# Patient Record
Sex: Female | Born: 1995 | Race: Black or African American | Hispanic: No | Marital: Single
Health system: Midwestern US, Community
[De-identification: ages and names within clinical notes are randomized; demographics above are authoritative.]

## PROBLEM LIST (undated history)

## (undated) ENCOUNTER — Emergency Department (HOSPITAL_COMMUNITY): Discharge status: Absent without leave | Payer: BLUE CROSS/BLUE SHIELD

## (undated) ENCOUNTER — Inpatient Hospital Stay (HOSPITAL_COMMUNITY): Payer: Self-pay

## (undated) ENCOUNTER — Inpatient Hospital Stay: Payer: Self-pay

## (undated) DIAGNOSIS — K219 Gastro-esophageal reflux disease without esophagitis: Secondary | ICD-10-CM

## (undated) DIAGNOSIS — R42 Dizziness and giddiness: Secondary | ICD-10-CM

## (undated) DIAGNOSIS — F32A Depression, unspecified: Secondary | ICD-10-CM

## (undated) DIAGNOSIS — A749 Chlamydial infection, unspecified: Secondary | ICD-10-CM

## (undated) DIAGNOSIS — R55 Syncope and collapse: Secondary | ICD-10-CM

## (undated) DIAGNOSIS — F3160 Bipolar disorder, current episode mixed, unspecified: Secondary | ICD-10-CM

## (undated) DIAGNOSIS — A549 Gonococcal infection, unspecified: Secondary | ICD-10-CM

## (undated) DIAGNOSIS — J45909 Unspecified asthma, uncomplicated: Secondary | ICD-10-CM

## (undated) DIAGNOSIS — F4310 Post-traumatic stress disorder, unspecified: Secondary | ICD-10-CM

## (undated) DIAGNOSIS — F419 Anxiety disorder, unspecified: Secondary | ICD-10-CM

## (undated) DIAGNOSIS — F319 Bipolar disorder, unspecified: Secondary | ICD-10-CM

## (undated) DIAGNOSIS — S2239XA Fracture of one rib, unspecified side, initial encounter for closed fracture: Secondary | ICD-10-CM

## (undated) DIAGNOSIS — F329 Major depressive disorder, single episode, unspecified: Secondary | ICD-10-CM

## (undated) DIAGNOSIS — F259 Schizoaffective disorder, unspecified: Secondary | ICD-10-CM

## (undated) DIAGNOSIS — D509 Iron deficiency anemia, unspecified: Secondary | ICD-10-CM

## (undated) HISTORY — DX: Gonococcal infection, unspecified: A54.9

## (undated) HISTORY — PX: NO PAST SURGERIES: SHX2092

## (undated) HISTORY — DX: Chlamydial infection, unspecified: A74.9

## (undated) HISTORY — DX: Fracture of one rib, unspecified side, initial encounter for closed fracture: S22.39XA

## (undated) HISTORY — DX: Dizziness and giddiness: R42

## (undated) HISTORY — DX: Syncope and collapse: R55

## (undated) HISTORY — DX: Schizoaffective disorder, unspecified: F25.9

## (undated) MED ORDER — PHENAZOPYRIDINE 200 MG TAB
200 mg | ORAL_TABLET | Freq: Three times a day (TID) | ORAL | Status: DC
Start: ? — End: 2012-08-31

## (undated) MED ORDER — ALBUTEROL SULFATE HFA 90 MCG/ACTUATION AEROSOL INHALER
90 mcg/actuation | Freq: Four times a day (QID) | RESPIRATORY_TRACT | Status: DC | PRN
Start: ? — End: 2019-03-30

## (undated) MED ORDER — CLOTRIMAZOLE 1 % TOPICAL CREAM
1 % | Freq: Two times a day (BID) | CUTANEOUS | Status: AC
Start: ? — End: 2012-10-05

## (undated) MED ORDER — HYDROCODONE-ACETAMINOPHEN 7.5 MG-325 MG TAB
ORAL_TABLET | Freq: Four times a day (QID) | ORAL | Status: DC | PRN
Start: ? — End: 2019-01-07

## (undated) MED ORDER — CEPHALEXIN 500 MG CAP
500 mg | ORAL_CAPSULE | Freq: Four times a day (QID) | ORAL | Status: AC
Start: ? — End: 2012-09-12

## (undated) MED ORDER — PHENAZOPYRIDINE 200 MG TAB
200 mg | ORAL_TABLET | Freq: Three times a day (TID) | ORAL | Status: AC
Start: ? — End: 2012-09-02

---
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
## 2013-08-31 NOTE — Patient Instructions (Signed)
Increase physical activity to 150 minutes a week (20 minutes a day). Reviewed the ABCs of fruits: Apples, Berries, and Cherries are a great fruit source, try to avoid anything that tastes very sweet as it is more of a low glycemic index food. Increase Green Leafy Vegetable intake, lean proteins such as baked fish and chicken, try to avoid fast food, greasy food, and fried food, and white foods such as White pasta, white potatoes, and white rice. Try substituting these foods with whole wheat/whole grain pasta, yams, and brown rice.

## 2013-08-31 NOTE — Progress Notes (Signed)
HISTORY OF PRESENT ILLNESS  Maddalena Artelia LarocheM Blume is a 18 y.o. female presents with New Patient and Medication Refill    [name] is here today to establish care.   She is present with her mother today.     Eyes: Not checked in the last year, will go next year (has every other year)  Dental: Not up to date  Diet: Does drink a lot of Pepsi  Exercise: Walks around campus      Denies any fevers, chills, unintentional weight loss, night sweats, chest pain, shortness of breath, chest pain/shortness of breath with exertion, abdominal pain, nausea, vomiting, diarrhea, constipation, blood in the stool, dysuria, new headaches, visual changes, or weakness.  The patient does admit that she drinks Pepsi all day, and that at times she has urinary frequency.       Agree with nurse note.      ROS    Review of Systems negative except as noted above in HPI.    ALLERGIES:  No Known Allergies    Current Outpatient Prescriptions   Medication Sig Dispense Refill   ??? biotin 2,500 mcg tab Take  by mouth daily.       ??? albuterol (PROAIR HFA) 90 mcg/actuation inhaler Take 2 puffs by inhalation every six (6) hours as needed for Wheezing. Indications: ACUTE ASTHMA ATTACK  1 Inhaler  6     PAST MEDICAL HISTORY:    Past Medical History   Diagnosis Date   ??? Asthma      Acute since childhood   ??? Contraception      Nexplanon left arm       PAST SURGICAL HISTORY:  History reviewed. No pertinent past surgical history.    FAMILY HISTORY:    Family History   Problem Relation Age of Onset   ??? Diabetes Mother    ??? Hypertension Mother    ??? Heart Disease Mother      SVT, ablation   ??? High Cholesterol Mother    ??? Other Mother      Fibromyalgia   ??? Cancer Maternal Grandmother [id number]     Breast and liver CA   ??? Heart Disease Maternal Grandmother    ??? Stroke Paternal Grandmother    ??? Heart Disease Paternal Grandfather    ??? Diabetes Other    ??? Hypertension Other        SOCIAL HISTORY:    History     Social History   ??? Marital Status: SINGLE     Spouse Name: N/A      Number of Children: N/A   ??? Years of Education: N/A     Occupational History   ??? 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 Smoking status: Current Every Day Smoker   ??? Smokeless tobacco: Current User      Comment: Black and milds   ??? Alcohol Use: No   ??? Drug Use: Yes     Special: Marijuana   ??? Sexually Active: Yes -- Female partner(s)     Birth Control/ Protection: Implant     Other Topics Concern   ??? Not on file     Social History Narrative   ??? No narrative on file       IMMUNIZATIONS:    There is no immunization history on file for this patient.      PHYSICAL EXAMINATION    Vital Signs  BP 112/57  Pulse 77   Temp(Src) 98.3 ??F (36.8 ??C) (Oral)   Resp 18   Ht 5' (1.524 m)   Wt 171 lb 9.6 oz (77.837 kg)   BMI 33.51 kg/m2   SpO2 96%    General appearance - Well nourished. Well appearing.  Well developed.  No acute distress. Overweight.   Head - Normocephalic.  Atraumatic.    Eyes - Extraocular eye movements intact.   Ears - Hearing is grossly normal bilaterally.      Nose - normal and patent. No discharge noted.    Mouth - mucous membranes with adequate moisture.  Posterior pharynx appears normal with no erythema, white exudate or obstruction.  Neck - supple.  Midline trachea. No thyromegaly noted.  Chest - clear to auscultation bilaterally anterriorly and posteriorly.  No wheezes.  No rales or rhonchi.  Breath sounds are symmetrical bilaterally.  Unlabored respirations.  Heart - normal rate.  Regular rhythm.  Normal S1, S2.  No murmur noted.  No rubs, clicks or gallops noted.  Abdomen - soft and nondistended.  No masses or organomegaly.  No rebound, rigidity or guarding.  Bowel sounds normal x 4 quadrants.  No tenderness noted.  Neurological - awake, alert. Cranial nerves II through XII grossly intact.  Clear speech.  Heme/Lymph - peripheral pulses normal x 4 extremities. No peripheral edema is noted.    Skin - no rashes, erythema, ecchymosis, noted, warm to touch   Psychological -   normal behavior, dress and thought processes.  Good insight. Good eye contact.  Normal affect.  Appropriate mood.  Normal speech.    ASSESSMENT and PLAN    Seerat was seen today for new patient and medication refill.    Diagnoses and associated orders for this visit:    Asthma  - albuterol (PROAIR HFA) 90 mcg/actuation inhaler; Take 2 puffs by inhalation every six (6) hours as needed for Wheezing. Indications: ACUTE ASTHMA ATTACK    Family history of early CAD  - METABOLIC PANEL, BASIC  - LIPID PANEL    Family history of diabetes mellitus type II  - HEMOGLOBIN A1C    Provided refill for albuterol inhaler today as patient's uses PRN.   Anticipatory guidance provided: Increase physical activity to 150 minutes a week (20 minutes a day). Reviewed the ABCs of fruits: Apples, Berries, and Cherries are a great fruit source, try to avoid anything that tastes very sweet as it is more of a low glycemic index food. Increase Green Leafy Vegetable intake, lean proteins such as baked fish and chicken, try to avoid fast food, greasy food, and fried food, and white foods such as White pasta, white potatoes, and white rice. Try substituting these foods with whole wheat/whole grain pasta, yams, and brown rice.     I encouraged the to continue to see a dentist every 6 months-1 year for screening and cleanings, and to see an eye doctor every other year for an eye check and glaucoma screening.   Will obtain fasting screening labs today due to the patient's family history as well as her current BMI>30.       Follow up PRN.   Patient expresses understanding of treatment plan and agrees with recommendations.          Patient Instructions   Increase physical activity to 150 minutes a week (20 minutes a day). Reviewed the ABCs of fruits: Apples, Berries, and Cherries are a great fruit source, try to avoid anything that tastes very sweet as it is  more of a low glycemic index food. Increase Green Leafy Vegetable intake, lean  proteins such as baked fish and chicken, try to avoid fast food, greasy food, and fried food, and white foods such as White pasta, white potatoes, and white rice. Try substituting these foods with whole wheat/whole grain pasta, yams, and brown rice.             Odie Sera, DO

## 2013-08-31 NOTE — Progress Notes (Signed)
New patient presents today to establish care with new pcp and medication refill. Patient present with Mother. No additional medical concerns noted at this time.

## 2014-08-27 DIAGNOSIS — F4310 Post-traumatic stress disorder, unspecified: Secondary | ICD-10-CM

## 2014-08-27 HISTORY — DX: Post-traumatic stress disorder, unspecified: F43.10

## 2014-10-02 ENCOUNTER — Emergency Department: Payer: Self-pay | Admitting: Emergency Medicine

## 2014-10-02 LAB — URINALYSIS, COMPLETE
Bacteria: NONE SEEN
Bilirubin,UR: NEGATIVE
Blood: NEGATIVE
Glucose,UR: NEGATIVE mg/dL (ref 0–75)
Hyaline Cast: 1
Ketone: NEGATIVE
Leukocyte Esterase: NEGATIVE
Nitrite: NEGATIVE
Ph: 5 (ref 4.5–8.0)
Protein: 30
RBC,UR: <1 /HPF (ref 0–5)
Specific Gravity: 1.026 (ref 1.003–1.030)
Squamous Epithelial: 7
WBC UR: 10 /HPF (ref 0–5)

## 2015-06-08 ENCOUNTER — Emergency Department (HOSPITAL_COMMUNITY)
Admission: EM | Admit: 2015-06-08 | Discharge: 2015-06-09 | Disposition: A | Discharge status: Home / Self Care | Payer: Medicaid Other | Attending: Emergency Medicine | Admitting: Emergency Medicine

## 2015-06-08 ENCOUNTER — Encounter (HOSPITAL_COMMUNITY): Payer: Self-pay | Admitting: Emergency Medicine

## 2015-06-08 DIAGNOSIS — R109 Unspecified abdominal pain: Secondary | ICD-10-CM | POA: Diagnosis not present

## 2015-06-08 DIAGNOSIS — Z3202 Encounter for pregnancy test, result negative: Secondary | ICD-10-CM | POA: Diagnosis not present

## 2015-06-08 DIAGNOSIS — R112 Nausea with vomiting, unspecified: Secondary | ICD-10-CM | POA: Diagnosis not present

## 2015-06-08 DIAGNOSIS — R110 Nausea: Secondary | ICD-10-CM

## 2015-06-08 LAB — I-STAT BETA HCG BLOOD, ED (MC, WL, AP ONLY): I-stat hCG, quantitative: <5 m[IU]/mL (ref <5)

## 2015-06-08 NOTE — ED Notes (Signed)
C/o nausea x 2 months.  States LMP 2 weeks ago but was light.  Pt requesting pregnancy test.

## 2015-06-08 NOTE — ED Provider Notes (Signed)
CSN: [id number]     Arrival date & time 06/08/15  2215 History  By signing my name below, I, Budd Palmer, attest that this documentation has been prepared under the direction and in the presence of [employer], PA-C. Electronically Signed: Budd Palmer, ED Scribe. 06/08/2015. 11:23 PM.    Chief Complaint  Patient presents with  . Nausea   The history is provided by the patient. No language interpreter was used.   HPI Comments: Melissa [name] is a 19 y.o. female who presents to the Emergency Department complaining of nausea onset 2 months ago. She reports associated suprapubic abdominal pain onset one week ago, as well as vomiting and generalized itchiness. She notes her LNMP was 1 week ago, which she states was lighter than usual. She notes she has tried benadryl for itching, but nothing for nausea. Pt denies vaginal discharge or bleeding, and urinary symptoms.  History reviewed. No pertinent past medical history. History reviewed. No pertinent past surgical history. No family history on file. Social History  Substance Use Topics  . Smoking status: Never Smoker   . Smokeless tobacco: None  . Alcohol Use: No   OB History    No data available     Review of Systems  Gastrointestinal: Positive for nausea, vomiting and abdominal pain.  Genitourinary: Negative for dysuria, frequency, vaginal bleeding, vaginal discharge and difficulty urinating.    Allergies  Review of patient's allergies indicates not on file.  Home Medications   Prior to Admission medications   Not on File   BP 127/72 mmHg  Pulse 87  Temp(Src) 98.5 F (36.9 C) (Oral)  Resp 18  SpO2 98%  LMP 05/25/2015 Physical Exam  Constitutional: She is oriented to person, place, and time. She appears well-developed and well-nourished.  HENT:  Head: Normocephalic.  Eyes: EOM are normal.  Neck: Normal range of motion.  Cardiovascular: Normal rate, regular rhythm and normal heart sounds.    Pulmonary/Chest: Effort normal and breath sounds normal. No respiratory distress. She has no wheezes. She has no rales.  Abdominal: Soft. Bowel sounds are normal. She exhibits no distension. There is no tenderness. There is no rebound and no guarding.  No CVA tenderness bilaterally  Musculoskeletal: Normal range of motion.  Neurological: She is alert and oriented to person, place, and time.  Psychiatric: She has a normal mood and affect.  Nursing note and vitals reviewed.   ED Course  Procedures  DIAGNOSTIC STUDIES: Oxygen Saturation is 98% on RA, normal by my interpretation.    COORDINATION OF CARE: 11:21 PM - Discussed plans to order diagnostic studies and perform a pelvic exam. Pt advised of plan for treatment and pt agrees.  Labs Review Labs Reviewed  WET PREP, GENITAL  URINALYSIS, ROUTINE W REFLEX MICROSCOPIC (NOT AT [name])  I-STAT BETA HCG BLOOD, ED ([facility], [facility], [facility] ONLY)  GC/CHLAMYDIA PROBE AMP ([place]) NOT AT [facility]    Imaging Review No results found. I have personally reviewed and evaluated these images and lab results as part of my medical decision-making.   EKG Interpretation None      MDM   Final diagnoses:  Nausea    patient emergency department complaining of bilateral lower abdominal, hip pain, nausea for several months. Patient is worried she may be pregnant. Will get urine pregnancy, urinalysis, pelvic exam.  Patient refused pelvic exam. Urinalysis is negative. Pregnancy test is negative. Discussed results with patient. Patient admitted that all she wanted to know is if she was pregnant. At this  time no active vomiting in the emergency department. Tolerating oral fluids. When to discharge home with outpatient follow-up as needed.  Filed Vitals:   06/08/15 2241 06/09/15 0114  BP: 127/72 120/80  Pulse: 87 82  Temp: 98.5 F (36.9 C) 98.3 F (36.8 C)  TempSrc: Oral   Resp: 18 19  SpO2: 98% 99%     Jaynie Crumbleatyana Deandrew Hoecker, PA-C 06/09/15 0115  Rolland PorterMark  James, MD 06/10/15 [phone]

## 2015-06-09 DIAGNOSIS — R112 Nausea with vomiting, unspecified: Secondary | ICD-10-CM | POA: Diagnosis not present

## 2015-06-09 LAB — URINALYSIS, ROUTINE W REFLEX MICROSCOPIC
Bilirubin Urine: NEGATIVE
Glucose, UA: NEGATIVE mg/dL
Hgb urine dipstick: NEGATIVE
Ketones, ur: NEGATIVE mg/dL
Leukocytes, UA: NEGATIVE
Nitrite: NEGATIVE
Protein, ur: NEGATIVE mg/dL
Specific Gravity, Urine: 1.03 (ref 1.005–1.030)
Urobilinogen, UA: 1 mg/dL (ref 0.0–1.0)
pH: 6.5 (ref 5.0–8.0)

## 2015-06-09 NOTE — ED Notes (Signed)
Pt refuses to have pelvic exam completed by PA.

## 2015-06-09 NOTE — Discharge Instructions (Signed)
Tried ginger products or Pepcid for nausea. Please follow-up with primary care doctor as needed. Urinalysis and urine pregnancy both negative   Nausea, Adult Nausea is the feeling that you have an upset stomach or have to vomit. Nausea by itself is not likely a serious concern, but it may be an early sign of more serious medical problems. As nausea gets worse, it can lead to vomiting. If vomiting develops, there is the risk of dehydration.  CAUSES   Viral infections.  Food poisoning.  Medicines.  Pregnancy.  Motion sickness.  Migraine headaches.  Emotional distress.  Severe pain from any source.  Alcohol intoxication. HOME CARE INSTRUCTIONS  Get plenty of rest.  Ask your caregiver about specific rehydration instructions.  Eat small amounts of food and sip liquids more often.  Take all medicines as told by your caregiver. SEEK MEDICAL CARE IF:  You have not improved after 2 days, or you get worse.  You have a headache. SEEK IMMEDIATE MEDICAL CARE IF:   You have a fever.  You faint.  You keep vomiting or have blood in your vomit.  You are extremely weak or dehydrated.  You have dark or bloody stools.  You have severe chest or abdominal pain. MAKE SURE YOU:  Understand these instructions.  Will watch your condition.  Will get help right away if you are not doing well or get worse.   This information is not intended to replace advice given to you by your health care provider. Make sure you discuss any questions you have with your health care provider.   Document Released: 09/20/2004 Document Revised: 09/03/2014 Document Reviewed: 04/25/2011 Elsevier Interactive Patient Education [employer]2016 Elsevier Inc.

## 2015-10-13 ENCOUNTER — Encounter (HOSPITAL_COMMUNITY): Payer: Self-pay

## 2015-10-13 ENCOUNTER — Emergency Department (HOSPITAL_COMMUNITY)
Admission: EM | Admit: 2015-10-13 | Discharge: 2015-10-13 | Disposition: A | Discharge status: Home / Self Care | Payer: Medicaid Other | Attending: Emergency Medicine | Admitting: Emergency Medicine

## 2015-10-13 DIAGNOSIS — F22 Delusional disorders: Secondary | ICD-10-CM | POA: Insufficient documentation

## 2015-10-13 HISTORY — DX: Depression, unspecified: F32.A

## 2015-10-13 HISTORY — DX: Major depressive disorder, single episode, unspecified: F32.9

## 2015-10-13 NOTE — ED Notes (Signed)
Pt's father came to get pt.  Left LWBS.

## 2015-10-13 NOTE — ED Notes (Signed)
Pt refused lab work. Triage RN aware.

## 2015-10-13 NOTE — ED Notes (Signed)
Per Da, pt is talking to others and delusional.  Pt denies.  Father states if she is cleared, she cannot come home.  He is wondering if drugs are involved.  Saying things that do not make sense.  Oriented in triage.

## 2015-10-13 NOTE — ED Notes (Signed)
Pt

## 2015-10-20 DIAGNOSIS — J45909 Unspecified asthma, uncomplicated: Secondary | ICD-10-CM | POA: Diagnosis not present

## 2015-10-20 DIAGNOSIS — F329 Major depressive disorder, single episode, unspecified: Secondary | ICD-10-CM | POA: Insufficient documentation

## 2015-10-20 DIAGNOSIS — Z008 Encounter for other general examination: Secondary | ICD-10-CM | POA: Diagnosis present

## 2015-10-21 ENCOUNTER — Encounter (HOSPITAL_COMMUNITY): Payer: Self-pay

## 2015-10-21 ENCOUNTER — Emergency Department (HOSPITAL_COMMUNITY)
Admission: EM | Admit: 2015-10-21 | Discharge: 2015-10-21 | Disposition: A | Discharge status: Home / Self Care | Payer: Medicaid Other | Attending: Emergency Medicine | Admitting: Emergency Medicine

## 2015-10-21 DIAGNOSIS — F32A Depression, unspecified: Secondary | ICD-10-CM

## 2015-10-21 DIAGNOSIS — F329 Major depressive disorder, single episode, unspecified: Secondary | ICD-10-CM

## 2015-10-21 HISTORY — DX: Unspecified asthma, uncomplicated: J45.909

## 2015-10-21 LAB — COMPREHENSIVE METABOLIC PANEL
ALT: 15 U/L (ref 14–54)
AST: 17 U/L (ref 15–41)
Albumin: 4.1 g/dL (ref 3.5–5.0)
Alkaline Phosphatase: 53 U/L (ref 38–126)
Anion gap: 6 (ref 5–15)
BUN: 12 mg/dL (ref 6–20)
CO2: 28 mmol/L (ref 22–32)
Calcium: 9.7 mg/dL (ref 8.9–10.3)
Chloride: 106 mmol/L (ref 101–111)
Creatinine, Ser: 0.92 mg/dL (ref 0.44–1.00)
GFR calc Af Amer: >60 mL/min (ref >60)
GFR calc non Af Amer: >60 mL/min (ref >60)
Glucose, Bld: 92 mg/dL (ref 65–99)
Potassium: 4.1 mmol/L (ref 3.5–5.1)
Sodium: 140 mmol/L (ref 135–145)
Total Bilirubin: 0.5 mg/dL (ref 0.3–1.2)
Total Protein: 7.1 g/dL (ref 6.5–8.1)

## 2015-10-21 LAB — CBC WITH DIFFERENTIAL/PLATELET
Basophils Absolute: 0 10*3/uL (ref 0.0–0.1)
Basophils Relative: 1 %
Eosinophils Absolute: 0.1 10*3/uL (ref 0.0–0.7)
Eosinophils Relative: 3 %
HCT: 36.8 % (ref 36.0–46.0)
Hemoglobin: 12.1 g/dL (ref 12.0–15.0)
Lymphocytes Relative: 47 %
Lymphs Abs: 2.7 10*3/uL (ref 0.7–4.0)
MCH: 29 pg (ref 26.0–34.0)
MCHC: 32.9 g/dL (ref 30.0–36.0)
MCV: 88.2 fL (ref 78.0–100.0)
Monocytes Absolute: 0.5 10*3/uL (ref 0.1–1.0)
Monocytes Relative: 8 %
Neutro Abs: 2.3 10*3/uL (ref 1.7–7.7)
Neutrophils Relative %: 41 %
Platelets: 234 10*3/uL (ref 150–400)
RBC: 4.17 MIL/uL (ref 3.87–5.11)
RDW: 13 % (ref 11.5–15.5)
WBC: 5.7 10*3/uL (ref 4.0–10.5)

## 2015-10-21 LAB — TSH: TSH: 2.139 u[IU]/mL (ref 0.350–4.500)

## 2015-10-21 NOTE — ED Notes (Signed)
Patient here with staff from [facility] for medical clearance.
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
## 2016-02-23 ENCOUNTER — Emergency Department
Admission: EM | Admit: 2016-02-23 | Discharge: 2016-02-23 | Disposition: A | Discharge status: Home / Self Care | Payer: BLUE CROSS/BLUE SHIELD | Attending: Emergency Medicine | Admitting: Emergency Medicine

## 2016-02-23 ENCOUNTER — Encounter: Payer: Self-pay | Admitting: Emergency Medicine

## 2016-02-23 DIAGNOSIS — R51 Headache: Secondary | ICD-10-CM | POA: Diagnosis not present

## 2016-02-23 DIAGNOSIS — F329 Major depressive disorder, single episode, unspecified: Secondary | ICD-10-CM | POA: Insufficient documentation

## 2016-02-23 DIAGNOSIS — Z711 Person with feared health complaint in whom no diagnosis is made: Secondary | ICD-10-CM | POA: Diagnosis not present

## 2016-02-23 DIAGNOSIS — Z79899 Other long term (current) drug therapy: Secondary | ICD-10-CM | POA: Diagnosis not present

## 2016-02-23 DIAGNOSIS — J45909 Unspecified asthma, uncomplicated: Secondary | ICD-10-CM | POA: Diagnosis not present

## 2016-02-23 DIAGNOSIS — Z87898 Personal history of other specified conditions: Secondary | ICD-10-CM

## 2016-02-23 LAB — CBC WITH DIFFERENTIAL/PLATELET
Basophils Absolute: 0.1 10*3/uL (ref 0–0.1)
Basophils Relative: 2 %
Eosinophils Absolute: 0.2 10*3/uL (ref 0–0.7)
Eosinophils Relative: 4 %
HCT: 33.6 % — ABNORMAL LOW (ref 35.0–47.0)
Hemoglobin: 11.7 g/dL — ABNORMAL LOW (ref 12.0–16.0)
Lymphocytes Relative: 35 %
Lymphs Abs: 1.7 10*3/uL (ref 1.0–3.6)
MCH: 29.9 pg (ref 26.0–34.0)
MCHC: 34.7 g/dL (ref 32.0–36.0)
MCV: 86.1 fL (ref 80.0–100.0)
Monocytes Absolute: 0.7 10*3/uL (ref 0.2–0.9)
Monocytes Relative: 14 %
Neutro Abs: 2.2 10*3/uL (ref 1.4–6.5)
Neutrophils Relative %: 45 %
Platelets: 196 10*3/uL (ref 150–440)
RBC: 3.9 MIL/uL (ref 3.80–5.20)
RDW: 13.1 % (ref 11.5–14.5)
WBC: 4.9 10*3/uL (ref 3.6–11.0)

## 2016-02-23 LAB — COMPREHENSIVE METABOLIC PANEL
ALT: 15 U/L (ref 14–54)
AST: 16 U/L (ref 15–41)
Albumin: 4.2 g/dL (ref 3.5–5.0)
Alkaline Phosphatase: 56 U/L (ref 38–126)
Anion gap: 5 (ref 5–15)
BUN: 7 mg/dL (ref 6–20)
CO2: 27 mmol/L (ref 22–32)
Calcium: 9.1 mg/dL (ref 8.9–10.3)
Chloride: 107 mmol/L (ref 101–111)
Creatinine, Ser: 0.85 mg/dL (ref 0.44–1.00)
GFR calc Af Amer: >60 mL/min (ref >60)
GFR calc non Af Amer: >60 mL/min (ref >60)
Glucose, Bld: 84 mg/dL (ref 65–99)
Potassium: 3.8 mmol/L (ref 3.5–5.1)
Sodium: 139 mmol/L (ref 135–145)
Total Bilirubin: 0.4 mg/dL (ref 0.3–1.2)
Total Protein: 6.8 g/dL (ref 6.5–8.1)

## 2016-02-23 LAB — URINALYSIS COMPLETE WITH MICROSCOPIC (ARMC ONLY)
Bilirubin Urine: NEGATIVE
Glucose, UA: NEGATIVE mg/dL
Hgb urine dipstick: NEGATIVE
Leukocytes, UA: NEGATIVE
Nitrite: NEGATIVE
Protein, ur: 30 mg/dL — AB
Specific Gravity, Urine: 1.034 — ABNORMAL HIGH (ref 1.005–1.030)
pH: 5 (ref 5.0–8.0)

## 2016-02-23 NOTE — ED Notes (Signed)
Pt discharged to home.  Family member driving.  Discharge instructions reviewed.  Verbalized understanding.  No questions or concerns at this time.  Teach back verified.  Pt in NAD.  No items left in ED.   

## 2016-02-23 NOTE — ED Notes (Signed)
POC pregnancy test negative.

## 2016-02-23 NOTE — ED Provider Notes (Signed)
[facility] Emergency Department Provider Note   ____________________________________________  Time seen: Approximately 3:30 PM  I have reviewed the triage vital signs and the nursing notes.   HISTORY  Chief Complaint Headache   HPI Melissa [name] is a 20 y.o. female is here today with complaint of history of headaches for which she is not taking any over-the-counter medication when she has a headache. She states that the headaches began posteriorly and comes around to the frontal area. These headaches do not cause any photophobia, nausea or vomiting. She states that she has not had a head injury. She has not sought medical attention for her headaches prior to today. She states that currently she is actively trying to get pregnant and did not know what to take should she want to take anything for her headache. Currently she does not have a doctor. She denies any fever, chills, vomiting or diarrhea. Currently she denies any pain with a 0/10.   Past Medical History  Diagnosis Date  . Depression   . Asthma     There are no active problems to display for this patient.   History reviewed. No pertinent past surgical history.  Current Outpatient Rx  Name  Route  Sig  Dispense  Refill  . Sertraline HCl (ZOLOFT PO)   Oral   Take 25 mg by mouth daily.           Allergies Review of patient's allergies indicates no known allergies.  No family history on file.  Social History Social History  Substance Use Topics  . Smoking status: Never Smoker   . Smokeless tobacco: None  . Alcohol Use: No    Review of Systems Constitutional: No fever/chills Eyes: No visual changes. ENT: No sore throat. Cardiovascular: Denies chest pain. Respiratory: Denies shortness of breath. Gastrointestinal: No abdominal pain.  No nausea, no vomiting.  No diarrhea.  No constipation. Genitourinary: Negative for dysuria. Musculoskeletal: Negative for back pain. Skin:  Negative for rash. Neurological: Positive for headaches, no focal weakness or numbness.  10-point ROS otherwise negative.  ____________________________________________   PHYSICAL EXAM:  VITAL SIGNS: ED Triage Vitals  Enc Vitals Group     BP 02/23/16 1512 111/79 mmHg     Pulse Rate 02/23/16 1512 87     Resp 02/23/16 1512 16     Temp 02/23/16 1512 98.2 F (36.8 C)     Temp Source 02/23/16 1512 Oral     SpO2 02/23/16 1512 10 %     Weight 02/23/16 1512 134 lb (60.782 kg)     Height 02/23/16 1512 5\' 7"  (1.702 m)     Head Cir --      Peak Flow --      Pain Score 02/23/16 1513 0     Pain Loc --      Pain Edu? --      Excl. in GC? --     Constitutional: Alert and oriented. Well appearing and in no acute distress.Patient is watching TV in the room and does not appear to be any acute distress.Patient denies any current headache. Eyes: Conjunctivae are normal. PERRL. EOMI. Head: Atraumatic. Nose: No congestion/rhinnorhea. Mouth/Throat: Mucous membranes are moist.  Oropharynx non-erythematous. Neck: No stridor.  No cervical tenderness on palpation posteriorly. Range of motion of cervical spine is without restriction or pain. Hematological/Lymphatic/Immunilogical: No cervical lymphadenopathy. Cardiovascular: Normal rate, regular rhythm. Grossly normal heart sounds.  Good peripheral circulation. Respiratory: Normal respiratory effort.  No retractions. Lungs CTAB. Musculoskeletal: Moves  upper and lower extremities without any difficulty. Neurologic:  Normal speech and language. No gross focal neurologic deficits are appreciated. No gait instability. Skin:  Skin is warm, dry and intact. No rash noted. Psychiatric: Mood and affect are normal. Speech and behavior are normal.  ____________________________________________   LABS (all labs ordered are listed, but only abnormal results are displayed)  Labs Reviewed  URINALYSIS COMPLETEWITH MICROSCOPIC ([facility] ONLY) - Abnormal; Notable for  the following:    Color, Urine YELLOW (*)    APPearance HAZY (*)    Ketones, ur TRACE (*)    Specific Gravity, Urine 1.034 (*)    Protein, ur 30 (*)    Bacteria, UA RARE (*)    Squamous Epithelial / LPF 0-5 (*)    All other components within normal limits  CBC WITH DIFFERENTIAL/PLATELET - Abnormal; Notable for the following:    Hemoglobin 11.7 (*)    HCT 33.6 (*)    All other components within normal limits  COMPREHENSIVE METABOLIC PANEL  POC URINE PREG, ED     PROCEDURES  Procedure(s) performed: None  Critical Care performed: No  ____________________________________________   INITIAL IMPRESSION / ASSESSMENT AND PLAN / ED COURSE  Pertinent labs & imaging results that were available during my care of the patient were reviewed by me and considered in my medical decision making (see chart for details).  ----------------------------------------- 4:09 PM on 02/23/2016 ----------------------------------------- Patient now complains of dizziness.   Lab work was within normal limits and patient was informed of her lab work including her pregnancy test was negative. Patient continues to state that she does not have a headache currently. We discussed making an appointment with a neurologist to talk about her intermittent headaches and what medications she can take. She is also given information that she could take Tylenol sparingly if needed for headache while trying to get pregnant. Patient is to make a point with the neurologist or establish a primary care doctor for any continued problems. Patient was noted to be eating a cookie and continuing to watch TV prior to her discharge. ____________________________________________   FINAL CLINICAL IMPRESSION(S) / ED DIAGNOSES  Final diagnoses:  Feared complaint without diagnosis  Hx of headache      NEW MEDICATIONS STARTED DURING THIS VISIT:  Discharge Medication List as of 02/23/2016  5:41 PM       Note:  This document was  prepared using Dragon voice recognition software and may include unintentional dictation errors.    Tommi RumpsRhonda L Summers, PA-C 02/23/16 1816  Emily FilbertJonathan E Vine, MD 02/24/16 [phone]

## 2016-02-23 NOTE — Discharge Instructions (Signed)
You may take Tylenol sparingly when you have a headache without any danger for your future pregnancy. Increase fluids. Call and make an appointment with the doctor listed on your papers for further  evaluation of your intermittent headaches.

## 2016-02-23 NOTE — ED Notes (Signed)
Pt reports that she does not take anything over the counter when she has headaches.  Pt states that she is here to get something not as strong for headaches.  Pt reports that she does not currently have a headache.  Pt states when she has them that she feels like she has pins and needles in her head.

## 2016-02-23 NOTE — ED Notes (Signed)
C/O fever x 2 - 3 weeks.  Also C/O headaches for "a couple of weeks".  Denies current headache.  Denies any current complaint.

## 2016-05-03 ENCOUNTER — Emergency Department (HOSPITAL_COMMUNITY)
Admission: EM | Admit: 2016-05-03 | Discharge: 2016-05-04 | Disposition: A | Discharge status: Other Acute Inpatient Hospital | Payer: Medicaid Other | Attending: Emergency Medicine | Admitting: Emergency Medicine

## 2016-05-03 ENCOUNTER — Encounter (HOSPITAL_COMMUNITY): Payer: Self-pay | Admitting: Emergency Medicine

## 2016-05-03 DIAGNOSIS — Z046 Encounter for general psychiatric examination, requested by authority: Secondary | ICD-10-CM

## 2016-05-03 DIAGNOSIS — R258 Other abnormal involuntary movements: Secondary | ICD-10-CM | POA: Diagnosis not present

## 2016-05-03 DIAGNOSIS — Z5181 Encounter for therapeutic drug level monitoring: Secondary | ICD-10-CM | POA: Diagnosis not present

## 2016-05-03 DIAGNOSIS — F320 Major depressive disorder, single episode, mild: Secondary | ICD-10-CM | POA: Insufficient documentation

## 2016-05-03 DIAGNOSIS — J45909 Unspecified asthma, uncomplicated: Secondary | ICD-10-CM | POA: Insufficient documentation

## 2016-05-03 LAB — COMPREHENSIVE METABOLIC PANEL
ALT: 15 U/L (ref 14–54)
AST: 21 U/L (ref 15–41)
Albumin: 4.5 g/dL (ref 3.5–5.0)
Alkaline Phosphatase: 51 U/L (ref 38–126)
Anion gap: 7 (ref 5–15)
BUN: 7 mg/dL (ref 6–20)
CO2: 26 mmol/L (ref 22–32)
Calcium: 9.6 mg/dL (ref 8.9–10.3)
Chloride: 107 mmol/L (ref 101–111)
Creatinine, Ser: 0.76 mg/dL (ref 0.44–1.00)
GFR calc Af Amer: >60 mL/min (ref >60)
GFR calc non Af Amer: >60 mL/min (ref >60)
Glucose, Bld: 117 mg/dL — ABNORMAL HIGH (ref 65–99)
Potassium: 3.7 mmol/L (ref 3.5–5.1)
Sodium: 140 mmol/L (ref 135–145)
Total Bilirubin: 1 mg/dL (ref 0.3–1.2)
Total Protein: 7.7 g/dL (ref 6.5–8.1)

## 2016-05-03 LAB — CBC
HCT: 37.6 % (ref 36.0–46.0)
Hemoglobin: 12.6 g/dL (ref 12.0–15.0)
MCH: 28.9 pg (ref 26.0–34.0)
MCHC: 33.5 g/dL (ref 30.0–36.0)
MCV: 86.2 fL (ref 78.0–100.0)
Platelets: 278 10*3/uL (ref 150–400)
RBC: 4.36 MIL/uL (ref 3.87–5.11)
RDW: 13.7 % (ref 11.5–15.5)
WBC: 6.1 10*3/uL (ref 4.0–10.5)

## 2016-05-03 LAB — ACETAMINOPHEN LEVEL: Acetaminophen (Tylenol), Serum: <10 ug/mL — ABNORMAL LOW (ref 10–30)

## 2016-05-03 LAB — SALICYLATE LEVEL: Salicylate Lvl: <4 mg/dL (ref 2.8–30.0)

## 2016-05-03 LAB — ETHANOL: Alcohol, Ethyl (B): <5 mg/dL (ref <5)

## 2016-05-03 MED ORDER — IBUPROFEN 200 MG PO TABS: 600.0000 mg | ORAL_TABLET | Freq: Three times a day (TID) | ORAL | Status: DC | PRN

## 2016-05-03 NOTE — ED Notes (Signed)
[facility]  She was escorted to her room and oriented to the unit.  She is pleasant and cooperative on approach.  She requested the television to be changed to [place] [employer].  She is currently resting quietly in her room.
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
## 2016-05-03 NOTE — ED Notes (Signed)
Bed: [facility] Expected date:  Expected time:  Means of arrival:  Comments: Triage 4 

## 2016-05-04 ENCOUNTER — Inpatient Hospital Stay (HOSPITAL_COMMUNITY)
Admission: AD | Admit: 2016-05-04 | Discharge: 2016-05-09 | DRG: 885 | Disposition: A | Discharge status: Home / Self Care | Payer: Self-pay | Source: Intra-hospital | Attending: Psychiatry | Admitting: Psychiatry

## 2016-05-04 DIAGNOSIS — F320 Major depressive disorder, single episode, mild: Secondary | ICD-10-CM | POA: Diagnosis present

## 2016-05-04 DIAGNOSIS — R45850 Homicidal ideations: Secondary | ICD-10-CM

## 2016-05-04 DIAGNOSIS — F250 Schizoaffective disorder, bipolar type: Secondary | ICD-10-CM

## 2016-05-04 DIAGNOSIS — Z9114 Patient's other noncompliance with medication regimen: Secondary | ICD-10-CM

## 2016-05-04 LAB — RAPID URINE DRUG SCREEN, HOSP PERFORMED
Amphetamines: NOT DETECTED
Barbiturates: NOT DETECTED
Benzodiazepines: NOT DETECTED
Cocaine: NOT DETECTED
Opiates: NOT DETECTED
Tetrahydrocannabinol: NOT DETECTED

## 2016-05-04 LAB — PREGNANCY, URINE: Preg Test, Ur: NEGATIVE

## 2016-05-04 MED ORDER — TRAZODONE HCL 50 MG PO TABS: 50.0000 mg | ORAL_TABLET | Freq: Every evening | ORAL | Status: DC | PRN

## 2016-05-04 NOTE — ED Notes (Signed)
Patient noted in room. No complaints, stable, in no acute distress. Q15 minute rounds and monitoring via Security Cameras to continue.  

## 2016-05-04 NOTE — ED Notes (Signed)
Patient pleasant on approach tonight. Resting in bed. Explained to her that she would probably be transferred tonight to behavioral health. Patient states she understands. No aggressive behaviors while here in ED. Having some HI toward family. No SI at present.
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
## 2016-05-04 NOTE — Progress Notes (Signed)
[facility]  Admitting is Dr. Jannifer FranklinAkintayo, Patient has been [facility] to 504-1, [facility]  [facility] Lillia AbedLindsay.  Patient can be transported after 4:30 p.m. Today Bethany Cumming K. Sherlon HandingHarris, LCAS-A, LPC-A, [facility]  Counselor 05/04/2016 11:48 AM

## 2016-05-04 NOTE — Progress Notes (Signed)
05/04/16 1350:  LRT went to pt room.  Pt was lying down but awake.  LRT asked pt if she was interested in any activities such as cards, UNO, coloring or word searches.  Pt stated she wanted to color.  Pt didn't have any specific pictures she wanted.  LRT printed out coloring pages of various things for pt.   Caroll RancherMarjette Kavaughn Faucett, LRT/CTRS
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
## 2016-05-05 ENCOUNTER — Encounter (HOSPITAL_COMMUNITY): Payer: Self-pay | Admitting: *Deleted

## 2016-05-05 DIAGNOSIS — F250 Schizoaffective disorder, bipolar type: Principal | ICD-10-CM

## 2016-05-05 DIAGNOSIS — F4312 Post-traumatic stress disorder, chronic: Secondary | ICD-10-CM

## 2016-05-05 DIAGNOSIS — F320 Major depressive disorder, single episode, mild: Secondary | ICD-10-CM

## 2016-05-05 MED ORDER — TRAZODONE HCL 50 MG PO TABS
50.0000 mg | ORAL_TABLET | Freq: Every evening | ORAL | Status: DC | PRN
  Filled 2016-05-05 (×12): qty 1

## 2016-05-05 MED ORDER — ACETAMINOPHEN 325 MG PO TABS: 650.0000 mg | ORAL_TABLET | Freq: Four times a day (QID) | ORAL | Status: DC | PRN

## 2016-05-05 MED ORDER — MAGNESIUM HYDROXIDE 400 MG/5ML PO SUSP: 30.0000 mL | Freq: Every day | ORAL | Status: DC | PRN

## 2016-05-05 MED ORDER — HYDROXYZINE HCL 25 MG PO TABS
25.0000 mg | ORAL_TABLET | Freq: Four times a day (QID) | ORAL | Status: DC | PRN
  Filled 2016-05-05: qty 1

## 2016-05-05 MED ORDER — ALUM & MAG HYDROXIDE-SIMETH 200-200-20 MG/5ML PO SUSP: 30.0000 mL | ORAL | Status: DC | PRN
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
## 2016-05-05 NOTE — [facility] Group Notes (Signed)
[facility] Group Notes: (Clinical Social Work)   05/05/2016      Type of Therapy:  Group Therapy   Participation Level:  Did Not Attend despite MHT prompting   Afshin Chrystal Grossman-Orr, LCSW 05/05/2016, 12:29 PM     

## 2016-05-05 NOTE — Progress Notes (Signed)
Patient has been isolative to her room this shift.  Patient spent the majority of the time in her room resting except for a few minutes where she spent in the group room.  Patient did not engage this Clinical research associatewriter in conversation this shift. Patient had no incident of behavioral dyscontrol.   Assess patient for safety, offer medications as prescribed, engage patient in 1:1 staff talk.   Continue to monitor as prescribed, patient able to contract for safety.
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
## 2016-05-05 NOTE — Progress Notes (Signed)
Adult Psychoeducational Group Note  Date:  05/05/2016 Time:  9:22 PM  Group Topic/Focus:  Wrap-Up Group:   The focus of this group is to help patients review their daily goal of treatment and discuss progress on daily workbooks.   Participation Level:  Active  Participation Quality:  Appropriate  Affect:  Appropriate  Cognitive:  Appropriate  Insight: Appropriate  Engagement in Group:  Engaged  Modes of Intervention:  Discussion  Additional Comments: The patient expressed that she attended all groups.The patient also said that her day was fine. Octavio Mannshigpen, Ranon Coven Lee 05/05/2016, 9:22 PM
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
## 2016-05-06 DIAGNOSIS — Z9119 Patient's noncompliance with other medical treatment and regimen: Secondary | ICD-10-CM

## 2016-05-06 NOTE — Progress Notes (Signed)
D: Patient pleasant and brightens on approach. Pt states she does not feel as if she needs to take Trazodone for sleep, as she is sleeping without difficulty. Pt interacting well with peers in the milieu and she participated in the evening wrap up group session.  A: Q 15 minute safety checks, encourage staff/peer interaction and group participation; administer medications as ordered.  R: Pt denies SI/HI or plans to harm herself; no s/s of distress noted this shift.

## 2016-05-06 NOTE — [facility] Group Notes (Signed)
[facility] Group Notes: (Clinical Social Work)   05/06/2016      Type of Therapy:  Group Therapy   Participation Level:  Did Not Attend despite MHT prompting   Ambrose MantleMareida Grossman-Orr, LCSW 05/06/2016, 12:16 PM
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
## 2016-05-06 NOTE — Progress Notes (Signed)
[facility] Group Notes:  (Nursing/MHT/Case Management/Adjunct)  Date:  05/06/2016  Time:  9:35 PM  Type of Therapy:  Psychoeducational Skills  Participation Level:  Minimal  Participation Quality:  Resistant  Affect:  Blunted  Cognitive:  Appropriate  Insight:  Limited  Engagement in Group:  Limited  Modes of Intervention:  Education  Summary of Progress/Problems: Patient states that she had a good day and that she enjoyed going outdoors for recreation. In terms of the theme for the day, her support system will be comprised of herself.   Melissa [name], Melissa [name] 05/06/2016, 9:35 PM
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
## 2016-05-07 MED ORDER — ZIPRASIDONE MESYLATE 20 MG IM SOLR: 20.0000 mg | INTRAMUSCULAR | Status: DC | PRN

## 2016-05-07 MED ORDER — LORAZEPAM 1 MG PO TABS: 1.0000 mg | ORAL_TABLET | ORAL | Status: DC | PRN

## 2016-05-07 MED ORDER — RISPERIDONE 2 MG PO TBDP
2.0000 mg | ORAL_TABLET | Freq: Three times a day (TID) | ORAL | Status: DC | PRN
Start: 2016-05-07 — End: 2016-05-09

## 2016-05-07 NOTE — Plan of Care (Signed)
Problem: Safety: Goal: Ability to demonstrate self-control will improve Outcome: Progressing Client demonstrates self control AEB interacting appropriately with staff and peers.
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
## 2016-05-07 NOTE — Progress Notes (Signed)
Nursing Note 05/07/2016 [id number]  Data Reports sleeping good without PRN sleep med.  Rates depression 0/10, hopelessness 0/10, and anxiety 0/10. Affect appropriate mood euthymic.  Denies HI, SI, AVH.  Stayed in room during AM, Did not attend AM group, but stated she would make it to the afternoon groups.  Patient signed in voluntarily this afternoon.  Action Spoke with patient 1:1, nurse offered support to patient throughout shift.  Continues to be monitored on 15 minute checks for safety.    Response Attended afternoon group.  No acting out behaviors this shift. Remains safe and appropriate on unit.

## 2016-05-07 NOTE — Progress Notes (Signed)
Psychoeducational Group Note  Date:  05/07/2016 Time:  2137  Group Topic/Focus:  Wrap-Up Group:   The focus of this group is to help patients review their daily goal of treatment and discuss progress on daily workbooks.   Participation Level: Did Not Attend  Participation Quality:  Not Applicable  Affect:  Not Applicable  Cognitive:  Not Applicable  Insight:  Not Applicable  Engagement in Group: Not Applicable  Additional Comments:  The patient did not attend group this evening.    Hazle CocaGOODMAN, Felesha Moncrieffe S 05/07/2016, 9:37 PM

## 2016-05-07 NOTE — Progress Notes (Signed)
Recreation Therapy Notes  Date: 05/07/16 Time: 1000 Location: 500 Hall Dayroom  Group Topic: Communication  Goal Area(s) Addresses:  Patient will effectively communicate with peers in group.  Patient will verbalize benefit of healthy communication. Patient will verbalize positive effect of healthy communication on post d/c goals.  Patient will identify communication techniques that made activity effective for group.   Behavioral Response: Engaged  Intervention:  Dry erase board, eraser, dry erase marker, strips of paper with random words   Activity: Pictionary.  LRT are divided the group into two teams.  Each person would get a turn.  Each person would draw a strip of paer from the container.  The person has to draw whatever is on the paper,  on the board.  While they are drawing, their team is trying to guess what it is they are drawing.  If the team guesses the picture they get a point.     Education:Communication, Discharge Planning  Education Outcome: Acknowledges understanding/In group clarification offered/Needs additional education.   Clinical Observations/Feedback: Pt was quiet but did engage in the activity.  Pt was active in trying to guess the pictures.     Caroll RancherMarjette Maliah Pyles, LRT/CTRS      Caroll RancherLindsay, Jaklyn Alen A 05/07/2016 11:56 AM

## 2016-05-07 NOTE — Progress Notes (Signed)
Recreation Therapy Notes  INPATIENT RECREATION THERAPY ASSESSMENT  Patient Details Name: Melissa [name] MRN: [id number] DOB: 1996-04-13 Today's Date: 05/07/2016  Patient Stressors: Family, Other (Comment) (Financial (housing))  Pt stated she was here for "anger management issues".  Coping Skills:   Isolate, Exercise, Art/Dance, Music, Other (Comment) (Counting to 10)  Personal Challenges: Anger, Communication, Stress Management  Leisure Interests (2+):  Art - Draw, Individual - Other (Comment) Adriana Simas([place])  Awareness of Community Resources:  Yes  Community Resources:  Park  Current Use: Yes  Patient Strengths:  Communication, confidence  Patient Identified Areas of Improvement:  "I don't know", "I'm open to ideas"  Current Recreation Participation:  Everyday  Patient Goal for Hospitalization:  "Get outside life together"  [place]idence[place]  [place] Residence[place]   Current [place] (including self-harm):  No  Current HI:  No  Consent to Intern Participation: N/A   Caroll RancherMarjette Allyn Bartelson, LRT/CTRS  Caroll RancherLindsay, Esperansa Sarabia A 05/07/2016, 3:15 PM
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
## 2016-05-08 NOTE — Progress Notes (Signed)
D: Pt denies SI/HI/AVH. Pt is pleasant and cooperative. Pt avoidant, pt denies anything is wrong. Pt has minimal insight into Tx, pt appears childlike on approach.   A: Pt was offered support and encouragement. Pt was given scheduled medications. Pt was encourage to attend groups. Q 15 minute checks were done for safety.   R: Pt is taking medication. Pt has no complaints.Pt receptive to treatment and safety maintained on unit.

## 2016-05-08 NOTE — Progress Notes (Signed)
Did not attend group 
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
## 2016-05-08 NOTE — Progress Notes (Signed)
D: Pt denies SI/HI/AV. Pt is pleasant and cooperative. Pt was isolative to room all of shift. She did not attend group. A: Pt was offered support and encouragement.  Pt was encourage to attend groups. Q 15 minute checks were done for safety.  R: Pt has no complaints.Pt receptive to treatment and safety maintained on unit.

## 2016-05-08 NOTE — Progress Notes (Signed)
Recreation Therapy Notes  Date: 05/08/16 Time: 1000 Location:  [address] Group Room  Group Topic: Self-Esteem  Goal Area(s) Addresses:  Patient will identify the importance of recognizing your uniqueness. Patient will verbalize benefit of increased self-esteem.  Intervention: Construction paper, magazines, scissors, glue sticks, colored pencils  Activity: Advertising.  LRT went over the concept of advertising and the purpose of it.  Patients were to look through the magazines to find pictures or words that describe and highlight the uniqueness about them.  Patients could also use colored pencils to write words or draw pictures if they couldn't find any in the magazines.   Education:  Self-Esteem, Discharge Planning.   Education Outcome: Needs additional education  Clinical Observations/Feedback: Pt did not attend group.   Kyisha Fowle, LRT/CTRS         Aylene Acoff A 05/08/2016 12:08 PM 

## 2016-05-08 NOTE — [facility] Group Notes (Signed)
[facility] LCSW Group Therapy  05/08/2016 , 1:31 PM   Type of Therapy:  Group Therapy  Participation Level:  Active  Participation Quality:  Attentive  Affect:  Appropriate  Cognitive:  Alert  Insight:  Improving  Engagement in Therapy:  Engaged  Modes of Intervention:  Discussion, Exploration and Socialization  Summary of Progress/Problems: Today's group focused on the term Diagnosis.  Participants were asked to define the term, and then pronounce whether it is a negative, positive or neutral term. Invited.  Chose to not attend.  Daryel Geraldorth, Avanna Sowder B 05/08/2016 , 1:31 PM

## 2016-05-08 NOTE — Progress Notes (Signed)
D: Pt A & O X3. Presents with depressed affect and mood. Denies SI, HI, AVH and pain when assessed with a head node then replied "I'm alright".  Pt is with drawned, isolative to her room. Avertive eye contact noted with soft speech.  Ate breakfast in her room and refused lunch when offered.  A: Emotional support and availability provided to pt. Encouraged to attend groups, increase PO intake and to voice concerns. Q 15 minutes safety checks maintained without incident to note thus far.  R: Pt stayed in bed all day thus far, did not attend scheduled unit groups. Pt remains safe on unit. POC maintained.

## 2016-05-09 MED ORDER — TRAZODONE HCL 50 MG PO TABS: 50.0000 mg | ORAL_TABLET | Freq: Every evening | ORAL | 0 refills | Status: DC | PRN

## 2016-05-09 MED ORDER — HYDROXYZINE HCL 25 MG PO TABS: 25.0000 mg | ORAL_TABLET | Freq: Four times a day (QID) | ORAL | 0 refills | Status: DC | PRN

## 2016-05-09 MED ORDER — TRAZODONE HCL 50 MG PO TABS
50.0000 mg | ORAL_TABLET | Freq: Every evening | ORAL | Status: DC | PRN
  Filled 2016-05-09: qty 5

## 2016-05-09 NOTE — Progress Notes (Signed)
Received phone message via [facility] that father requesting to speak with patient and/or staff regarding patient's status. Father, Melissa [name] [phone]), did not have code and patient did not consent for staff to speak with father. Relayed message to patient however as she was discharging she indicated she did not wish to speak with him.

## 2016-05-09 NOTE — Progress Notes (Addendum)
  [facility] :  Will you be returning to the same living situation after discharge:  No. At discharge, do you have transportation home?: Yes,  bus pass Do you have the ability to pay for your medications: Yes,  mental health  Release of information consent forms completed and in the chart;  Patient's signature needed at discharge.  Patient to Follow up at: Follow-up [employer]    [facility] .   Specialty:  Professional Counselor Why:  Go to the walk-in clinic M-F between 8:30 and noon for your hospital follow up appointment Contact information: [facility] [address] [place] [address] [phone]           Next level of care provider has [facility] to [facility] Link:no  Safety Planning and Suicide Prevention discussed: Yes,  yes  Have you used any form of tobacco in the last 30 days? (Cigarettes, Smokeless Tobacco, Cigars, and/or Pipes): No  Has patient been referred to the Quitline?: N/A patient is not a smoker  Patient has been referred for addiction treatment: N/A  Melissa [name] 05/09/2016, 10:33 AM

## 2016-05-09 NOTE — Progress Notes (Signed)
Recreation Therapy Notes  Date: 05/09/16 Time: 1000 Location: 500 Hall Dayroom  Group Topic: Coping Skills  Goal Area(s) Addresses:  Patient will be able to identify positive coping skills. Patient will be able to identify how using positive coping skills will help them post d/c.  Intervention: Worksheet, colored pencils  Activity: Web Design.  Patients were given [name] worksheet with [name] web design.  Patients were to identify the situations they are facing that have them "stuck" and write them within the lines of the web.  Patients were then asked to come up with coping skills for each of the situations they are facing.  Education: Coping Skills, Discharge Planning.   Education Outcome: Needs additional education.   Clinical Observations/Feedback: Pt did not attend group.   Melissa [name], LRT/CTRS         Melissa [name] 05/09/2016 12:50 PM 

## 2016-05-09 NOTE — [facility] Suicide Risk Assessment (Signed)
[facility]   Principal Problem: Schizoaffective disorder, bipolar type [facility](HCC) Discharge Diagnoses:  Patient Active Problem List   Diagnosis Date Noted  . 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 Schizoaffective disorder, bipolar type (HCC) [F25.0] 05/04/2016    Total Time spent with patient: 30 minutes  Musculoskeletal: Strength & Muscle Tone: within normal limits Gait & Station: normal Patient leans: N/A  Psychiatric Specialty Exam: ROS  Blood pressure 101/67, pulse 93, temperature 98.4 F (36.9 C), temperature source Oral, resp. rate 16, height 5\' 6"  (1.676 m), weight 59.4 kg (131 lb), SpO2 100 %.Body mass index is 21.14 kg/m.  General Appearance: Casual  Eye Contact::  Fair  Speech:  Clear and Coherent409  Volume:  Normal  Mood:  Euthymic  Affect:  Appropriate  Thought Process:  Goal Directed and Descriptions of Associations: Intact  Orientation:  Full (Time, Place, and Person)  Thought Content:  Logical  Suicidal Thoughts:  No  Homicidal Thoughts:  No  Memory:  Immediate;   Fair Recent;   Fair Remote;   Fair  Judgement:  Fair  Insight:  Negative  Psychomotor Activity:  Normal  Concentration:  Fair  Recall:  FiservFair  Fund of Knowledge:Fair  Language: Fair  Akathisia:  No  Handed:  Right  AIMS (if indicated):     Assets:  Desire for Improvement  Sleep:  Number of Hours: 4.75  Cognition: WNL  ADL's:  Intact   Mental Status Per Nursing Assessment::   On Admission:  Thoughts of violence towards others  Demographic Factors:  Unemployed  Loss Factors: NA  Historical Factors: Impulsivity  Risk Reduction Factors:   Positive coping skills or problem solving skills  Continued Clinical Symptoms:  Unstable or Poor Therapeutic Relationship Previous Psychiatric Diagnoses and Treatments  Cognitive Features That Contribute To Risk:  Polarized thinking    Suicide Risk:  Minimal: No  identifiable suicidal ideation.  Patients presenting with no risk factors but with morbid ruminations; may be classified as minimal risk based on the severity of the depressive symptoms  [facility] .   Specialty:  Professional Counselor Why:  Go to the walk-in clinic M-F between 8:30 and noon for your hospital follow up appointment Contact information: [facility] [address] [place] [address] [phone]           Plan Of Care/Follow-up reno restrictionsmendations:  Activity:  no restrictions Diet:  regular  Tasha Diaz, MD 05/09/2016, 10:57 AM

## 2016-05-09 NOTE — Tx Team (Signed)
Interdisciplinary Treatment and Diagnostic Plan Update  05/09/2016 Time of Session: 10:26 AM  Melissa [name] MRN: [id number]  Principal Diagnosis: Schizoaffective disorder, bipolar type ([place])  Secondary Diagnoses: Principal Problem:   Schizoaffective disorder, bipolar type ([place]) Active Problems:   MDD (major depressive disorder), single episode, mild ([place])   Current Medications:  Current Facility-Administered Medications  Medication Dose Route Frequency Provider Last Rate Last Dose  . acetaminophen (TYLENOL) tablet 650 mg  650 mg Oral Q6H PRN Ambrose Finland, MD      . alum & mag hydroxide-simeth (MAALOX/MYLANTA) 200-200-20 MG/5ML suspension 30 mL  30 mL Oral Q4H PRN Ambrose Finland, MD      . hydrOXYzine (ATARAX/VISTARIL) tablet 25 mg  25 mg Oral Q6H PRN Ambrose Finland, MD      . risperiDONE (RISPERDAL M-TABS) disintegrating tablet 2 mg  2 mg Oral Q8H PRN Ursula Alert, MD       And  . LORazepam (ATIVAN) tablet 1 mg  1 mg Oral PRN Ursula Alert, MD       And  . ziprasidone (GEODON) injection 20 mg  20 mg Intramuscular PRN Saramma Eappen, MD      . magnesium hydroxide (MILK OF MAGNESIA) suspension 30 mL  30 mL Oral Daily PRN Ambrose Finland, MD      . traZODone (DESYREL) tablet 50 mg  50 mg Oral QHS,MR X 1 Ambrose Finland, MD        PTA Medications: No prescriptions prior to admission.    Treatment Modalities: Medication Management, Group therapy, Case management,  1 to 1 session with clinician, Psychoeducation, Recreational therapy.   Physician Treatment Plan for Primary Diagnosis: Schizoaffective disorder, bipolar type ([place]) Long Term Goal(s): Improvement in symptoms so as ready for discharge  Short Term Goals: Ability to identify and develop effective coping behaviors will improve  Medication Management: Evaluate patient's response, side effects, and tolerance of medication regimen.  Therapeutic Interventions: 1 to 1  sessions, Unit Group sessions and Medication administration.  Evaluation of Outcomes: Met  Physician Treatment Plan for Secondary Diagnosis: Principal Problem:   Schizoaffective disorder, bipolar type ([place]) Active Problems:   MDD (major depressive disorder), single episode, mild ([place])   Long Term Goal(s): Improvement in symptoms so as ready for discharge  Short Term Goals: Ability to identify triggers associated with substance abuse/mental health issues will improve  Medication Management: Evaluate patient's response, side effects, and tolerance of medication regimen.  Therapeutic Interventions: 1 to 1 sessions, Unit Group sessions and Medication administration.  Evaluation of Outcomes: Met   RN Treatment Plan for Primary Diagnosis: Schizoaffective disorder, bipolar type ([place]) Long Term Goal(s): Knowledge of disease and therapeutic regimen to maintain health will improve  Short Term Goals: Ability to verbalize frustration and anger appropriately will improve  Medication Management: RN will administer medications as ordered by provider, will assess and evaluate patient's response and provide education to patient for prescribed medication. RN will report any adverse and/or side effects to prescribing provider.  Therapeutic Interventions: 1 on 1 counseling sessions, Psychoeducation, Medication administration, Evaluate responses to treatment, Monitor vital signs and CBGs as ordered, Perform/monitor CIWA, COWS, AIMS and Fall Risk screenings as ordered, Perform wound care treatments as ordered.  Evaluation of Outcomes: Met   LCSW Treatment Plan for Primary Diagnosis: Schizoaffective disorder, bipolar type ([place]) Long Term Goal(s): Safe transition to appropriate next level of care at discharge, Engage patient in therapeutic group addressing interpersonal concerns.  Short Term Goals: Engage patient in aftercare planning with referrals and resources  Therapeutic Interventions: Assess for  all discharge needs, 1 to 1 time with Education officer, museum, Explore available resources and support systems, Assess for adequacy in community support network, Educate family and significant other(s) on suicide prevention, Complete Psychosocial Assessment, Interpersonal group therapy.  Evaluation of Outcomes: Met   Progress in Treatment: Attending groups: Yes Participating in groups: Yes Taking medication as prescribed: Yes Toleration medication: Yes, no side effects reported at this time Family/Significant other contact made: No Patient understands diagnosis: No  Limited insight Discussing patient identified problems/goals with staff: Yes Medical problems stabilized or resolved: Yes Denies suicidal/homicidal ideation: Yes Issues/concerns per patient self-inventory: None Other: N/A  New problem(s) identified: None identified at this time.   New Short Term/Long Term Goal(s): None identified at this time.   Discharge Plan or Barriers:  States she will try to get into [facility], [employer], follow up [employer]  Reason for Continuation of Hospitalization: Homicidal ideation-resolved Medication stabilization   Estimated Length of Stay: D/C today  Attendees: Patient: 05/09/2016  10:26 AM  Physician: Ursula Alert, MD 05/09/2016  10:26 AM  Nursing: Gerald Leitz, RN 05/09/2016  10:26 AM  RN Care Manager: Lars Pinks, RN 05/09/2016  10:26 AM  Social Worker: Ripley Fraise 05/09/2016  10:26 AM  Recreational Therapist: Laretta Bolster  05/09/2016  10:26 AM  Other: Norberto Sorenson 05/09/2016  10:26 AM  Other:  05/09/2016  10:26 AM    Scribe for Treatment Team:  Roque Lias 05/09/2016 10:26 AM

## 2016-05-09 NOTE — Progress Notes (Signed)
D: Patient resting in bed, refusing groups. Isolative, flat and depressed. Irritable at times, did not wish to speak with this writer however spoke with patient 1:1 to review discharge plan. Patient is rating sleep good, appetite good, energy low and concentration good. Rating her depression, hopelessness and anxiety all at a 0/10. Denies pain, physical problems.   A: No medications ordered, no prns needed. Emotional support offered and self inventory reviewed.   R: Patient verbalizes understanding however states, "can you leave me alone?" before this Clinical research associatewriter can finish discharge teaching. Teaching was completed. Patient denies SI/HI and remains safe on level III obs.

## 2016-05-09 NOTE — Progress Notes (Signed)
Pt d/c from the hospital with a bus pass. All items returned. D/C instructions given, prescriptions given and samples given. Pt denies si and hi. 

## 2016-05-09 NOTE — BHH Suicide Risk Assessment (Signed)
B[facility] INPATIENT:  Family/Significant Other Suicide Prevention Education  Suicide Prevention Education:  Education Completed;  No one has been identified by the patient as the family member/significant other with whom the patient will be residing, and identified as the person(s) who will aid the patient in the event of a mental health crisis (suicidal ideations/suicide attempt).  With written consent from the patient, the family member/significant other has been provided the following suicide prevention education, prior to the and/or following the discharge of the patient.  The suicide prevention education provided includes the following:  Suicide risk factors  Suicide prevention and interventions  National Suicide Hotline telephone number  [facility] assessment telephone number  [facility] [place] Emergency Assistance 911  [facility] and/or Residential Mobile Crisis Unit telephone number  Request made of family/significant other to:  Remove weapons (e.g., guns, rifles, knives), all items previously/currently identified as safety concern.    Remove drugs/medications (over-the-counter, prescriptions, illicit drugs), all items previously/currently identified as a safety concern.  The family member/significant other verbalizes understanding of the suicide prevention education information provided.  The family member/significant other agrees to remove the items of safety concern listed above. The patient did not endorse SI at the time of admission, nor did the patient c/o SI during the stay here.  SPE not required.   Ida RogueRodney B Laythan Hayter 05/09/2016, 10:32 AM
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
## 2016-07-19 ENCOUNTER — Encounter: Payer: Self-pay | Admitting: Emergency Medicine

## 2016-07-19 ENCOUNTER — Inpatient Hospital Stay
Admission: AD | Admit: 2016-07-19 | Discharge: 2016-08-07 | DRG: 885 | Payer: No Typology Code available for payment source | Source: Intra-hospital | Attending: Psychiatry | Admitting: Psychiatry

## 2016-07-19 ENCOUNTER — Emergency Department
Admission: EM | Admit: 2016-07-19 | Discharge: 2016-07-19 | Disposition: A | Discharge status: Other Acute Inpatient Hospital | Payer: Medicaid Other | Attending: Emergency Medicine | Admitting: Emergency Medicine

## 2016-07-19 DIAGNOSIS — F410 Panic disorder [episodic paroxysmal anxiety] without agoraphobia: Secondary | ICD-10-CM | POA: Diagnosis present

## 2016-07-19 DIAGNOSIS — R253 Fasciculation: Secondary | ICD-10-CM | POA: Diagnosis not present

## 2016-07-19 DIAGNOSIS — J45909 Unspecified asthma, uncomplicated: Secondary | ICD-10-CM | POA: Insufficient documentation

## 2016-07-19 DIAGNOSIS — Z5181 Encounter for therapeutic drug level monitoring: Secondary | ICD-10-CM | POA: Insufficient documentation

## 2016-07-19 DIAGNOSIS — Z915 Personal history of self-harm: Secondary | ICD-10-CM | POA: Diagnosis not present

## 2016-07-19 DIAGNOSIS — F251 Schizoaffective disorder, depressive type: Secondary | ICD-10-CM | POA: Diagnosis present

## 2016-07-19 DIAGNOSIS — F250 Schizoaffective disorder, bipolar type: Secondary | ICD-10-CM | POA: Diagnosis present

## 2016-07-19 DIAGNOSIS — Z653 Problems related to other legal circumstances: Secondary | ICD-10-CM | POA: Diagnosis not present

## 2016-07-19 DIAGNOSIS — F32A Depression, unspecified: Secondary | ICD-10-CM

## 2016-07-19 DIAGNOSIS — Z9114 Patient's other noncompliance with medication regimen: Secondary | ICD-10-CM | POA: Diagnosis not present

## 2016-07-19 DIAGNOSIS — F488 Other specified nonpsychotic mental disorders: Secondary | ICD-10-CM | POA: Diagnosis present

## 2016-07-19 DIAGNOSIS — Z638 Other specified problems related to primary support group: Secondary | ICD-10-CM

## 2016-07-19 DIAGNOSIS — G4700 Insomnia, unspecified: Secondary | ICD-10-CM | POA: Diagnosis present

## 2016-07-19 DIAGNOSIS — F329 Major depressive disorder, single episode, unspecified: Secondary | ICD-10-CM | POA: Insufficient documentation

## 2016-07-19 DIAGNOSIS — R440 Auditory hallucinations: Secondary | ICD-10-CM | POA: Diagnosis present

## 2016-07-19 DIAGNOSIS — F22 Delusional disorders: Secondary | ICD-10-CM | POA: Diagnosis present

## 2016-07-19 DIAGNOSIS — Z9119 Patient's noncompliance with other medical treatment and regimen: Secondary | ICD-10-CM | POA: Diagnosis not present

## 2016-07-19 DIAGNOSIS — R45851 Suicidal ideations: Secondary | ICD-10-CM

## 2016-07-19 DIAGNOSIS — Z590 Homelessness: Secondary | ICD-10-CM | POA: Diagnosis not present

## 2016-07-19 LAB — CBC
HCT: 38.2 % (ref 35.0–47.0)
Hemoglobin: 13.2 g/dL (ref 12.0–16.0)
MCH: 30.2 pg (ref 26.0–34.0)
MCHC: 34.6 g/dL (ref 32.0–36.0)
MCV: 87.3 fL (ref 80.0–100.0)
Platelets: 242 10*3/uL (ref 150–440)
RBC: 4.38 MIL/uL (ref 3.80–5.20)
RDW: 13.3 % (ref 11.5–14.5)
WBC: 4.5 10*3/uL (ref 3.6–11.0)

## 2016-07-19 LAB — URINE DRUG SCREEN, QUALITATIVE (ARMC ONLY)
Amphetamines, Ur Screen: NOT DETECTED
Barbiturates, Ur Screen: NOT DETECTED
Benzodiazepine, Ur Scrn: NOT DETECTED
Cannabinoid 50 Ng, Ur ~~LOC~~: NOT DETECTED
Cocaine Metabolite,Ur ~~LOC~~: NOT DETECTED
MDMA (Ecstasy)Ur Screen: NOT DETECTED
Methadone Scn, Ur: NOT DETECTED
Opiate, Ur Screen: NOT DETECTED
Phencyclidine (PCP) Ur S: NOT DETECTED
Tricyclic, Ur Screen: NOT DETECTED

## 2016-07-19 LAB — URINALYSIS COMPLETE WITH MICROSCOPIC (ARMC ONLY)
Bilirubin Urine: NEGATIVE
Glucose, UA: NEGATIVE mg/dL
Hgb urine dipstick: NEGATIVE
Ketones, ur: NEGATIVE mg/dL
Leukocytes, UA: NEGATIVE
Nitrite: NEGATIVE
Protein, ur: NEGATIVE mg/dL
Specific Gravity, Urine: 1.019 (ref 1.005–1.030)
pH: 6 (ref 5.0–8.0)

## 2016-07-19 LAB — COMPREHENSIVE METABOLIC PANEL
ALT: 17 U/L (ref 14–54)
AST: 20 U/L (ref 15–41)
Albumin: 4.3 g/dL (ref 3.5–5.0)
Alkaline Phosphatase: 63 U/L (ref 38–126)
Anion gap: 4 — ABNORMAL LOW (ref 5–15)
BUN: 12 mg/dL (ref 6–20)
CO2: 27 mmol/L (ref 22–32)
Calcium: 9.5 mg/dL (ref 8.9–10.3)
Chloride: 108 mmol/L (ref 101–111)
Creatinine, Ser: 0.92 mg/dL (ref 0.44–1.00)
GFR calc Af Amer: >60 mL/min (ref >60)
GFR calc non Af Amer: >60 mL/min (ref >60)
Glucose, Bld: 98 mg/dL (ref 65–99)
Potassium: 3.6 mmol/L (ref 3.5–5.1)
Sodium: 139 mmol/L (ref 135–145)
Total Bilirubin: 0.4 mg/dL (ref 0.3–1.2)
Total Protein: 7.5 g/dL (ref 6.5–8.1)

## 2016-07-19 LAB — ETHANOL: Alcohol, Ethyl (B): <5 mg/dL (ref <5)

## 2016-07-19 LAB — POCT PREGNANCY, URINE: Preg Test, Ur: NEGATIVE

## 2016-07-19 LAB — SALICYLATE LEVEL: Salicylate Lvl: <7 mg/dL (ref 2.8–30.0)

## 2016-07-19 LAB — ACETAMINOPHEN LEVEL: Acetaminophen (Tylenol), Serum: <10 ug/mL — ABNORMAL LOW (ref 10–30)

## 2016-07-19 MED ORDER — MAGNESIUM HYDROXIDE 400 MG/5ML PO SUSP
30.0000 mL | Freq: Every day | ORAL | Status: DC | PRN
  Filled 2016-07-19: qty 30

## 2016-07-19 MED ORDER — ALUM & MAG HYDROXIDE-SIMETH 200-200-20 MG/5ML PO SUSP: 30.0000 mL | ORAL | Status: DC | PRN

## 2016-07-19 MED ORDER — ACETAMINOPHEN 325 MG PO TABS: 650.0000 mg | ORAL_TABLET | Freq: Four times a day (QID) | ORAL | Status: DC | PRN

## 2016-07-19 NOTE — BH Assessment (Signed)
Patient [facility] Attending Physician (Dr. Garnetta BuddyFaheem) will writer admission orders. Attending Physician will be Dr. Ardyth HarpsHernandez.   Patient has been assigned to room 303, by BMU Charge Nurse Angelica ChessmanMandy Leonette [facility](Amanda N)   Intake Paper Work has been signed and placed on patient chart.  ER staff is aware of the admission Christen Bame(Ronnie, ER Sect.; Dr. Don PerkingVeronese, ER MD; Amy Patient's Nurse & Clydie BraunKaren, Patient Access).

## 2016-07-19 NOTE — ED Notes (Signed)
Patient asleep in room. No noted distress or abnormal behavior. Will continue 15 minute checks and observation by security cameras for safety. 

## 2016-07-19 NOTE — ED Notes (Signed)
Pt has taken a shower. Pt remains calm and pleasant. No concerns voiced. Maintained on 15 minute checks and observation by security camera for safety.
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
## 2016-07-19 NOTE — Tx Team (Signed)
Initial Treatment Plan 07/19/2016 3:46 PM Melissa [name] [id number]    PATIENT STRESSORS: Financial difficulties Legal issue Marital or family conflict Occupational concerns   PATIENT STRENGTHS: Capable of independent living Wellsite geologistCommunication skills General fund of knowledge Motivation for treatment/growth Work skills   PATIENT IDENTIFIED PROBLEMS:   "I tried to kill myself. I was going to hang myself in the basement." -- suicidal ideation    "I don't work right now. I moved from one boarding house to another 1-2 weeks ago because the rent was cheaper."    "I don't have any family to contact."           DISCHARGE CRITERIA:  Ability to meet basic life and health needs Adequate post-discharge living arrangements Improved stabilization in mood, thinking, and/or behavior Medical problems require only outpatient monitoring Need for constant or close observation no longer present Reduction of life-threatening or endangering symptoms to within safe limits Verbal commitment to aftercare and medication compliance  PRELIMINARY DISCHARGE PLAN: Attend aftercare/continuing care group Outpatient therapy Return to previous living arrangement  PATIENT/FAMILY INVOLVEMENT: This treatment plan has been presented to and reviewed with the patient, Melissa [name], and/or family member, .  The patient and family have been given the opportunity to ask questions and make suggestions.  Tonye PearsonAmanda N Miroslava Santellan, RN 07/19/2016, 3:46 PM

## 2016-07-19 NOTE — ED Notes (Addendum)
RN explained to pt she will be admitted to inpatient unit.  Pt accepting. Pt voiced no complaints. Maintained on 15 minute checks and observation by security camera for safety.

## 2016-07-19 NOTE — ED Notes (Signed)
Report called to FinleyMandy, RN in BMU.

## 2016-07-19 NOTE — ED Notes (Signed)
Pt is awake and active in the milieu this evening. Pt endorses SI but states that she can contract for safety and would let staff know before doing anything to harm herself. Writer [facility], provided food and drink and explained tx plan. Pt mood is depressed but she is pleasant and cooperative with staff. She reports "sleeping all day" so she is not tired. Pt watching TV in room and 15 minute checks are ongoing for safety.

## 2016-07-19 NOTE — ED Triage Notes (Addendum)
Patient ambulatory to triage with steady gait, without difficulty or distress noted; pt reports "went thru a little episode, hitting self with a can and trying to kill self in the basement with a noose"; [facility] but has not been taking her medications, st doesn't want to take it; pt accomp by landlord who st he heard her crying and admits to hearing voices that tell her to hurt herself at times; took her to her father's house but she wouldn't get out of the car and was hitting self in head with cans; Huntley DecSara, EDT to complete protocols and change pt into behav scrubs

## 2016-07-19 NOTE — ED Notes (Addendum)
Pt denies SI/HI and AVH. Pt stated she had a hard day yesterday and began feeling depressed. Pt denies having a history of depression.  Pt stated she would not attempt to harm herself if allowed to leave  the hospital. RN explained to pt she would [facility] and a decision would be made regarding her disposition. Pt accepting.   Pt does have diagnosis of MDD and schichoeffective disorder. Note in chart also states pt stated she does hear voices telling her to kill herself.   Maintained on 15 minute checks and observation by security camera for safety.

## 2016-07-19 NOTE — ED Notes (Addendum)
Pt. Denies to this nurse SI/HI, Denies delusions or hallucinations, although reported in triage.

## 2016-07-19 NOTE — ED Provider Notes (Signed)
[facility] Emergency Department Provider Note   First MD Initiated Contact with Patient 07/19/16 0345     (approximate)  I have reviewed the triage vital signs and the nursing notes.   HISTORY  Chief Complaint Mental Health Problem   HPI Melissa [name] is a 20 y.o. female presents admitting to suicidal ideation and depression. Patient was found by her landlord in the basement with a noose and very tearful. Patient does admit that she's been having suicidal ideation over the past few days. Patient admits to depression. Patient also admits to not taking prescribed psychiatric medications.   Past Medical History:  Diagnosis Date  . Asthma   . Depression     Patient Active Problem List   Diagnosis Date Noted  . MDD (major depressive disorder), single episode, mild (HCC) 05/05/2016  . Schizoaffective disorder, bipolar type (HCC) 05/04/2016    History reviewed. No pertinent surgical history.  Prior to Admission medications   Medication Sig Start Date End Date Taking? Authorizing Provider  hydrOXYzine (ATARAX/VISTARIL) 25 MG tablet Take 1 tablet (25 mg total) by mouth every 6 (six) hours as needed for anxiety. 05/09/16   Beau FannyJohn C Withrow, FNP  traZODone (DESYREL) 50 MG tablet Take 1 tablet (50 mg total) by mouth at bedtime as needed for sleep. 05/09/16   Beau FannyJohn C Withrow, FNP    Allergies Patient has no known allergies.  No family history on file.  Social History Social History  Substance Use Topics  . Smoking status: Never Smoker  . Smokeless tobacco: Never Used  . Alcohol use No    Review of Systems Constitutional: No fever/chills Eyes: No visual changes. ENT: No sore throat. Cardiovascular: Denies chest pain. Respiratory: Denies shortness of breath. Gastrointestinal: No abdominal pain.  No nausea, no vomiting.  No diarrhea.  No constipation. Genitourinary: Negative for dysuria. Musculoskeletal: Negative for back pain. Skin: Negative for  rash. Neurological: Negative for headaches, focal weakness or numbness. Psychiatric:Positive for depression and suicidal ideation  10-point ROS otherwise negative.  ____________________________________________   PHYSICAL EXAM:  VITAL SIGNS: ED Triage Vitals  Enc Vitals Group     BP 07/19/16 0332 111/82     Pulse Rate 07/19/16 0332 84     Resp 07/19/16 0332 18     Temp 07/19/16 0332 98 F (36.7 C)     Temp Source 07/19/16 0332 Oral     SpO2 07/19/16 0332 100 %     Weight 07/19/16 0233 135 lb (61.2 kg)     Height 07/19/16 0233 5\' 7"  (1.702 m)     Head Circumference --      Peak Flow --      Pain Score --      Pain Loc --      Pain Edu? --      Excl. in GC? --     Constitutional: Alert and oriented. Well appearing and in no acute distress. Eyes: Conjunctivae are normal. PERRL. EOMI. Head: Atraumatic. Mouth/Throat: Mucous membranes are moist.  Oropharynx non-erythematous. Neck: No stridor.  No meningeal signs.  Cardiovascular: Normal rate, regular rhythm. Good peripheral circulation. Grossly normal heart sounds. Respiratory: Normal respiratory effort.  No retractions. Lungs CTAB. Gastrointestinal: Soft and nontender. No distention.  Musculoskeletal: No lower extremity tenderness nor edema. No gross deformities of extremities. Neurologic:  Normal speech and language. No gross focal neurologic deficits are appreciated.  Skin:  Skin is warm, dry and intact. No rash noted. Psychiatric: Depressed mood. Speech and behavior are normal.  ____________________________________________   LABS (all labs ordered are listed, but only abnormal results are displayed)  Labs Reviewed  COMPREHENSIVE METABOLIC PANEL - Abnormal; Notable for the following:       Result Value   Anion gap 4 (*)    All other components within normal limits  ACETAMINOPHEN LEVEL - Abnormal; Notable for the following:    Acetaminophen (Tylenol), Serum <10 (*)    All other components within normal limits    URINALYSIS COMPLETEWITH MICROSCOPIC ([facility] ONLY) - Abnormal; Notable for the following:    Color, Urine YELLOW (*)    APPearance CLEAR (*)    Bacteria, UA RARE (*)    Squamous Epithelial / LPF 0-5 (*)    All other components within normal limits  ETHANOL  SALICYLATE LEVEL  CBC  URINE DRUG SCREEN, QUALITATIVE ([facility] ONLY)  POCT PREGNANCY, URINE  POC URINE PREG, ED      Procedures      INITIAL IMPRESSION / ASSESSMENT AND PLAN / ED COURSE  Pertinent labs & imaging results that were available during my care of the patient were reviewed by me and considered in my medical decision making (see chart for details).  Awaiting psychiatry consultation   Clinical Course     ____________________________________________  FINAL CLINICAL IMPRESSION(S) / ED DIAGNOSES  Final diagnoses:  Suicidal ideation  Depression, unspecified depression type     MEDICATIONS GIVEN DURING THIS VISIT:  Medications - No data to display   NEW OUTPATIENT MEDICATIONS STARTED DURING THIS VISIT:  New Prescriptions   No medications on file    Modified Medications   No medications on file    Discontinued Medications   No medications on file     Note:  This document was prepared using Dragon voice recognition software and may include unintentional dictation errors.    Darci Currentandolph N Aleria Maheu, MD 07/19/16 [phone]
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
## 2016-07-19 NOTE — ED Notes (Signed)
Patient resting quietly in room. No noted distress or abnormal behaviors noted. Will continue 15 minute checks and observation by security camera for safety. 

## 2016-07-19 NOTE — ED Notes (Signed)
[facility]

## 2016-07-19 NOTE — ED Notes (Signed)
Pt IVC. Pt to be admitted to [facility]. Belongings will be sent with patient.

## 2016-07-20 DIAGNOSIS — F251 Schizoaffective disorder, depressive type: Principal | ICD-10-CM

## 2016-07-20 MED ORDER — CITALOPRAM HYDROBROMIDE 20 MG PO TABS
10.0000 mg | ORAL_TABLET | Freq: Every day | ORAL | Status: DC
  Filled 2016-07-20: qty 1

## 2016-07-20 MED ORDER — RISPERIDONE 1 MG PO TABS
1.0000 mg | ORAL_TABLET | Freq: Every day | ORAL | Status: DC
  Filled 2016-07-20 (×3): qty 1

## 2016-07-20 NOTE — [facility] Group Notes (Signed)
[facility] Group Notes:  (Nursing/MHT/Case Management/Adjunct)  Date:  07/20/2016  Time:  3:23 AM  Type of Therapy:  Group Therapy  Participation Level:  Did Not Attend    Veva Holesshley Imani Moksh Loomer 07/20/2016, 3:23 AM

## 2016-07-20 NOTE — Progress Notes (Addendum)
Isolated, no participation.

## 2016-07-20 NOTE — Plan of Care (Signed)
Problem: Medication: Goal: Compliance with prescribed medication regimen will improve Outcome: Not Progressing Refuses medications at this time.

## 2016-07-20 NOTE — Plan of Care (Signed)
Problem: Safety: Goal: Ability to remain free from injury will improve Outcome: Progressing no PRN given, 15 minute checks maintained for safety, clinical and moral support provided, patient encouraged to continue to express feelings and demonstrate safe care. Patient remains free from harm, will continue to monitor.      

## 2016-07-20 NOTE — Progress Notes (Signed)
Pt refusing medication as ordered. States "I don't like taking medicine." Encouragement provided. Will inform Md. Will continue to monitor.

## 2016-07-20 NOTE — Social Work (Signed)
[facility] LCSW Group Therapy   07/20/2016 9:30 AM   Type of Therapy: Group Therapy   Participation Level: Invited but did not attend.  Participation Quality: Invited but did not attend.  Hampton AbbotKadijah Kuuipo Anzaldo, MSW, LCSWA 07/20/2016, 11:02AM

## 2016-07-20 NOTE — Plan of Care (Signed)
Problem: Medication: Goal: Compliance with prescribed medication regimen will improve Outcome: Not Progressing Pt refuses bedtime medications. She states "I don't want to take meds I just want to learn coping skills."  Problem: Coping: Goal: Ability to interact with others will improve Outcome: Not Progressing Pt isolative to her room this evening. No interaction with peers noted.

## 2016-07-20 NOTE — Progress Notes (Signed)
Pt awake, alert, up on unit today. Interacts appropriately with staff/peers. Observed in dayroom socializing with peers this morning. Did not attend group. Denies SI/HI/AVH. Appears flat, hangs her head, forwards little, depressed. Refuses medication, cooperative otherwise.   Support and encouragement provided with use of therapeutic communication. Medications administered as ordered with education, but pt refuses. Safety maintained with every 15 minute checks. Will continue to monitor.

## 2016-07-20 NOTE — H&P (Signed)
Psychiatric Admission Assessment Adult  Patient Identification: Melissa [name] MRN:  [id number] Date of Evaluation:  07/20/2016 Chief Complaint:  Depression Principal Diagnosis: Schizoaffective disorder depressed type. Diagnosis:   Patient Active Problem List   Diagnosis Date Noted  . Schizoaffective disorder ([place]) [F25.9] 07/19/2016  . MDD (major depressive disorder), single episode, mild ([place]) [F32.0] 05/05/2016  . Schizoaffective disorder, bipolar type ([place]) [F25.0] 05/04/2016   History of Present Illness:  Patient is a 20 year old female with history of schizoaffective disorder who was admitted after she tried to hurt herself. She was trying to hang himself in the basement of her apartment complex and was found by her landlord. Patient reported that she lives on the top floor. She reported that she has been becoming more depressed progressively. She reported that she decided to hurt herself and went to the basement and everything was already fair. She reported it was really hard to figure out her self. She stated that she currently lives by herself. Patient is unable to contract for safety at this time. Associated Signs/Symptoms: Depression Symptoms:  depressed mood, anhedonia, psychomotor retardation, fatigue, hopelessness, suicidal thoughts with specific plan, suicidal attempt, anxiety, (Hypo) Manic Symptoms:  Impulsivity, Irritable Mood, Anxiety Symptoms:  Excessive Worry, Psychotic Symptoms:  Paranoia, PTSD Symptoms: Had a traumatic exposure:  past Total Time spent with patient: 1 hour  Past Psychiatric History: She has been treated at [facility] for in patient treatment and [facility] for out patient treatment. Recent discharge from [facility] in September.  Is the patient at risk to self? Yes.    Has the patient been a risk to self in the past 6 months? Yes.    Has the patient been a risk to self within the distant past? Yes.    Is the patient a risk to others? No.  Has  the patient been a risk to others in the past 6 months? No.  Has the patient been a risk to others within the distant past? No.   Prior Inpatient Therapy:   Prior Outpatient Therapy:    Alcohol Screening: 1. How often do you have a drink containing alcohol?: Never 2. How many drinks containing alcohol do you have on a typical day when you are drinking?: 1 or 2 3. How often do you have six or more drinks on one occasion?: Never Preliminary Score: 0 4. How often during the last year have you found that you were not able to stop drinking once you had started?: Never 5. How often during the last year have you failed to do what was normally expected from you becasue of drinking?: Never 6. How often during the last year have you needed a first drink in the morning to get yourself going after a heavy drinking session?: Never 7. How often during the last year have you had a feeling of guilt of remorse after drinking?: Never 8. How often during the last year have you been unable to remember what happened the night before because you had been drinking?: Never 9. Have you or someone else been injured as a result of your drinking?: No 10. Has a relative or friend or a doctor or another health worker been concerned about your drinking or suggested you cut down?: No Alcohol Use Disorder Identification Test Final Score (AUDIT): 0 Brief Intervention: AUDIT score less than 7 or less-screening does not suggest unhealthy drinking-brief intervention not indicated Substance Abuse History in the last 12 months:  No. Consequences of Substance Abuse: Negative NA Previous Psychotropic Medications:  yes yes Past Medical History:  Past Medical History:  Diagnosis Date  . Asthma   . Depression    History reviewed. No pertinent surgical history. Family History: History reviewed. No pertinent family history. Family Psychiatric  History: Tobacco Screening: Have you used any form of tobacco in the last 30 days?  (Cigarettes, Smokeless Tobacco, Cigars, and/or Pipes): No Social History:  History  Alcohol Use No     History  Drug Use No    Additional Social History:      History of alcohol / drug use?: No history of alcohol / drug abuse                    Allergies:  No Known Allergies Lab Results:  Results for orders placed or performed during the hospital encounter of 07/19/16 (from the past 48 hour(s))  Comprehensive metabolic panel     Status: Abnormal   Collection Time: 07/19/16  2:36 AM  Result Value Ref Range   Sodium 139 135 - 145 mmol/L   Potassium 3.6 3.5 - 5.1 mmol/L   Chloride 108 101 - 111 mmol/L   CO2 27 22 - 32 mmol/L   Glucose, Bld 98 65 - 99 mg/dL   BUN 12 6 - 20 mg/dL   Creatinine, Ser 0.92 0.44 - 1.00 mg/dL   Calcium 9.5 8.9 - 10.3 mg/dL   Total Protein 7.5 6.5 - 8.1 g/dL   Albumin 4.3 3.5 - 5.0 g/dL   AST 20 15 - 41 U/L   ALT 17 14 - 54 U/L   Alkaline Phosphatase 63 38 - 126 U/L   Total Bilirubin 0.4 0.3 - 1.2 mg/dL   GFR calc non Af Amer >60 >60 mL/min   GFR calc Af Amer >60 >60 mL/min    Comment: (NOTE) The eGFR has been calculated using the CKD EPI equation. This calculation has not been validated in all clinical situations. eGFR's persistently <60 mL/min signify possible Chronic Kidney Disease.    Anion gap 4 (L) 5 - 15  Ethanol     Status: None   Collection Time: 07/19/16  2:36 AM  Result Value Ref Range   Alcohol, Ethyl (B) <5 <5 mg/dL    Comment:        LOWEST DETECTABLE LIMIT FOR SERUM ALCOHOL IS 5 mg/dL FOR MEDICAL PURPOSES ONLY   Salicylate level     Status: None   Collection Time: 07/19/16  2:36 AM  Result Value Ref Range   Salicylate Lvl <5.6 2.8 - 30.0 mg/dL  Acetaminophen level     Status: Abnormal   Collection Time: 07/19/16  2:36 AM  Result Value Ref Range   Acetaminophen (Tylenol), Serum <10 (L) 10 - 30 ug/mL    Comment:        THERAPEUTIC CONCENTRATIONS VARY SIGNIFICANTLY. A RANGE OF 10-30 ug/mL MAY BE AN  EFFECTIVE CONCENTRATION FOR MANY PATIENTS. HOWEVER, SOME ARE BEST TREATED AT CONCENTRATIONS OUTSIDE THIS RANGE. ACETAMINOPHEN CONCENTRATIONS >150 ug/mL AT 4 HOURS AFTER INGESTION AND >50 ug/mL AT 12 HOURS AFTER INGESTION ARE OFTEN ASSOCIATED WITH TOXIC REACTIONS.   cbc     Status: None   Collection Time: 07/19/16  2:36 AM  Result Value Ref Range   WBC 4.5 3.6 - 11.0 K/uL   RBC 4.38 3.80 - 5.20 MIL/uL   Hemoglobin 13.2 12.0 - 16.0 g/dL   HCT 38.2 35.0 - 47.0 %   MCV 87.3 80.0 - 100.0 fL   MCH 30.2 26.0 - 34.0 pg  MCHC 34.6 32.0 - 36.0 g/dL   RDW 13.3 11.5 - 14.5 %   Platelets 242 150 - 440 K/uL  Urine Drug Screen, Qualitative     Status: None   Collection Time: 07/19/16  2:36 AM  Result Value Ref Range   Tricyclic, Ur Screen NONE DETECTED NONE DETECTED   Amphetamines, Ur Screen NONE DETECTED NONE DETECTED   MDMA (Ecstasy)Ur Screen NONE DETECTED NONE DETECTED   Cocaine Metabolite,Ur [place] NONE DETECTED NONE DETECTED   Opiate, Ur Screen NONE DETECTED NONE DETECTED   Phencyclidine (PCP) Ur S NONE DETECTED NONE DETECTED   Cannabinoid 50 Ng, Ur [place] NONE DETECTED NONE DETECTED   Barbiturates, Ur Screen NONE DETECTED NONE DETECTED   Benzodiazepine, Ur Scrn NONE DETECTED NONE DETECTED   Methadone Scn, Ur NONE DETECTED NONE DETECTED    Comment: (NOTE) 250  Tricyclics, urine               Cutoff 1000 ng/mL 200  Amphetamines, urine             Cutoff 1000 ng/mL 300  MDMA (Ecstasy), urine           Cutoff 500 ng/mL 400  Cocaine Metabolite, urine       Cutoff 300 ng/mL 500  Opiate, urine                   Cutoff 300 ng/mL 600  Phencyclidine (PCP), urine      Cutoff 25 ng/mL 700  Cannabinoid, urine              Cutoff 50 ng/mL 800  Barbiturates, urine             Cutoff 200 ng/mL 900  Benzodiazepine, urine           Cutoff 200 ng/mL 1000 Methadone, urine                Cutoff 300 ng/mL 1100 1200 The urine drug screen provides only a preliminary, unconfirmed 1300 analytical test result  and should not be used for non-medical 1400 purposes. Clinical consideration and professional judgment should 1500 be applied to any positive drug screen result due to possible 1600 interfering substances. A more specific alternate chemical method 1700 must be used in order to obtain a confirmed analytical result.  1800 Gas chromato graphy / mass spectrometry (GC/MS) is the preferred 1900 confirmatory method.   Urinalysis complete, with microscopic ([facility] only)     Status: Abnormal   Collection Time: 07/19/16  2:36 AM  Result Value Ref Range   Color, Urine YELLOW (A) YELLOW   APPearance CLEAR (A) CLEAR   Glucose, UA NEGATIVE NEGATIVE mg/dL   Bilirubin Urine NEGATIVE NEGATIVE   Ketones, ur NEGATIVE NEGATIVE mg/dL   Specific Gravity, Urine 1.019 1.005 - 1.030   Hgb urine dipstick NEGATIVE NEGATIVE   pH 6.0 5.0 - 8.0   Protein, ur NEGATIVE NEGATIVE mg/dL   Nitrite NEGATIVE NEGATIVE   Leukocytes, UA NEGATIVE NEGATIVE   RBC / HPF 0-5 0 - 5 RBC/hpf   WBC, UA 0-5 0 - 5 WBC/hpf   Bacteria, UA RARE (A) NONE SEEN   Squamous Epithelial / LPF 0-5 (A) NONE SEEN   Mucous PRESENT   Pregnancy, urine POC     Status: None   Collection Time: 07/19/16  2:43 AM  Result Value Ref Range   Preg Test, Ur NEGATIVE NEGATIVE    Comment:        THE SENSITIVITY OF THIS METHODOLOGY IS >  24 mIU/mL     Blood Alcohol level:  Lab Results  Component Value Date   ETH <5 07/19/2016   ETH <5 95/63/8756    Metabolic Disorder Labs:  No results found for: HGBA1C, MPG No results found for: PROLACTIN No results found for: CHOL, TRIG, HDL, CHOLHDL, VLDL, LDLCALC  Current Medications: Current Facility-Administered Medications  Medication Dose Route Frequency Provider Last Rate Last Dose  . acetaminophen (TYLENOL) tablet 650 mg  650 mg Oral Q6H PRN Rainey Pines, MD      . alum & mag hydroxide-simeth (MAALOX/MYLANTA) 200-200-20 MG/5ML suspension 30 mL  30 mL Oral Q4H PRN Rainey Pines, MD      . citalopram  (CELEXA) tablet 10 mg  10 mg Oral Daily Rainey Pines, MD      . magnesium hydroxide (MILK OF MAGNESIA) suspension 30 mL  30 mL Oral Daily PRN Rainey Pines, MD      . risperiDONE (RISPERDAL) tablet 1 mg  1 mg Oral QHS Rainey Pines, MD       PTA Medications: Prescriptions Prior to Admission  Medication Sig Dispense Refill Last Dose  . hydrOXYzine (ATARAX/VISTARIL) 25 MG tablet Take 1 tablet (25 mg total) by mouth every 6 (six) hours as needed for anxiety. 30 tablet 0   . traZODone (DESYREL) 50 MG tablet Take 1 tablet (50 mg total) by mouth at bedtime as needed for sleep. 30 tablet 0     Musculoskeletal: Strength & Muscle Tone: within normal limits Gait & Station: normal Patient leans: N/A  Psychiatric Specialty Exam: Physical Exam  ROS  Blood pressure 109/66, pulse (!) 55, temperature 98.2 F (36.8 C), temperature source Oral, resp. rate 18, height 5' 7" (1.702 m), weight 130 lb (59 kg), last menstrual period 06/27/2016, SpO2 100 %.Body mass index is 20.36 kg/m.  General Appearance: Casual  Eye Contact:  Fair  Speech:  Slow  Volume:  Decreased  Mood:  Anxious and Depressed  Affect:  Blunt and Congruent  Thought Process:  Goal Directed  Orientation:  Full (Time, Place, and Person)  Thought Content:  Paranoid Ideation  Suicidal Thoughts:  Yes.  with intent/plan  Homicidal Thoughts:  No  Memory:  Immediate;   Fair Recent;   Fair  Judgement:  Impaired  Insight:  Lacking  Psychomotor Activity:  Decreased  Concentration:  Concentration: Fair and Attention Span: Fair  Recall:  [employer] of Knowledge:  Fair  Language:  Fair  Akathisia:  No  Handed:  Right  AIMS (if indicated):     Assets:  Armed forces logistics/support/administrative officer Physical Health  ADL's:  Intact  Cognition:  WNL  Sleep:  Number of Hours: 7.45    Treatment Plan Summary: Daily contact with patient to assess and evaluate symptoms and progress in treatment and Medication management  Observation Level/Precautions:  Continuous  Observation  Laboratory:  CBC  Psychotherapy:    Medications:    Consultations:    Discharge Concerns:    Estimated LOS:  Other:     Physician Treatment Plan for Primary Diagnosis: <principal problem not specified> Long Term Goal(s): Improvement in symptoms so as ready for discharge  Short Term Goals: Ability to identify changes in lifestyle to reduce recurrence of condition will improve  Physician Treatment Plan for Secondary Diagnosis: Active Problems:   Schizoaffective disorder ([place])  Long Term Goal(s): Improvement in symptoms so as ready for discharge  Short Term Goals: Ability to identify changes in lifestyle to reduce recurrence of condition will improve  I certify that  inpatient services furnished can reasonably be expected to improve the patient's condition.     I will start her on Celexa for depressive symptoms. I will start her on Risperdal for her paranoia. Discussed with the patient about her medications. Will be monitored closely by the staff. Advised her about the medication compliance and she demonstrated understanding   Rainey Pines, MD 11/24/20174:55 PM
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
## 2016-07-20 NOTE — Progress Notes (Signed)
Observed resting in bed, spontaneously responded to prompt (knock on the door), depressed but hopefull, denied pain, denied SI/HI, denied AV/H. Isolated to room, did not participate in group, did not come out for snacks; will continue to monitor for safety.

## 2016-07-20 NOTE — [facility] Suicide Risk Assessment (Signed)
[facility] INPATIENT:  Family/Significant Other Suicide Prevention Education  Suicide Prevention Education:  Patient Refusal for Family/Significant Other Suicide Prevention Education: The patient Melissa [name] has refused to provide written consent for family/significant other to be provided Family/Significant Other Suicide Prevention Education during admission and/or prior to discharge.  Physician notified. Pt stated that she does not want family notified of her current hospitalization. CSW informed pt that in order to discharge, CSW and attending psychiatrist will need to speak with someone in regards to suicidal attempt. Pt was not agreeable - will revisit subject Monday 11/27.  Lynden OxfordKadijah R Allicia Culley, MSW, LCSW-A 07/20/2016, 12:27 PM

## 2016-07-20 NOTE — Care Management (Signed)
Melissa [name] is doing well this morning, she seems to be in a good mood and in conversation was very pleasant with me. I recommended our services and let her know that we do have a woman on staff that she could speak with if she is uncomfortable sharing personal information with me. She understands and will let me know if she needs anything.

## 2016-07-21 DIAGNOSIS — R9431 Abnormal electrocardiogram [ECG] [EKG]: Secondary | ICD-10-CM

## 2016-07-21 MED ORDER — CITALOPRAM HYDROBROMIDE 20 MG PO TABS
20.0000 mg | ORAL_TABLET | Freq: Every day | ORAL | Status: DC
  Filled 2016-07-21 (×2): qty 1

## 2016-07-21 NOTE — Progress Notes (Signed)
Pt awake, alert, oriented. Isolative to room most of the day, comes out for meals and did attend group. Denies SI/HI/AVH. Reports she is not having thoughts of suicide or hurting self today.Continues to refuse medications. Forwards little. Brightens on approach. Reports good sleep last night, good appetite, normal energy, good concentration. Rates depression 0/10, hopelessness 0/10, anxiety 0/10 (low 0-10 high).   Support and encouragement provided with use of therapeutic communication. Pt refuses medications. Safety maintained with every 15 minute checks. Will continue to monitor.

## 2016-07-21 NOTE — Progress Notes (Signed)
[facility] MD Progress Note  07/21/2016 5:32 PM Melissa [name]  MRN:  [id number] Subjective:  Principal Problem: Major depression and suicidal thoughts  The patient reports that prior to admission, she had some brief suicidal thoughts and wanted to hang herself. She has been estranged from her family since she moved to [place] and is not allowing this Clinical research associate or the treatment team to contact her family. 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 She says her landlord allows her to stay there for free currently. Her father, 2 brothers and a sister live in [place] but she says she has no contact with them. She denies any current active or passive suicidal thoughts but mood remains depressed. She denies any auditory or visual hallucinations. She did not exhibit any overt paranoid thoughts today but was started on Risperdal by Dr. Garnetta Buddy. She has not been compliant with either Risperdal or Celexa started by Dr. Garnetta Buddy. She says she does not want to take medications because she fears side effects and wants to just work on improving coping skills. She has been attending groups. Appetite is fairly good. She denies any problems with insomnia and slept over 7 hours last night. Vital signs have been stable.  Supportive psychotherapy provided and times spent discussing coping skills when she is feeling down, depressed or suicidal. Times spent encouraging the patient also developed a support system and increased social interactions with others.   Past Psychiatric History: The patient reports that she has been hospitalized a number times before [facility] and [place]. She was supposed to have outpatient treatment at [facility] but has not been fully compliant. She does report a history of suicidal thoughts in the past but no other suicide attempts. She says she is tried multiple episodes in the past but could not name the  medications.   Social History: The patient was born and raised in [place], [place] by her parents until they divorced. Her mother still lives in [place] and her father now lives in [place]. She also has 2 brothers and one sister in [place]. She is estranged from her father and siblings and does not have a close relationship with them. She did get a list [employer] for close to 2 years before dropping out. 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 She currently lives in a boarding house and says her landlord has been taking her rent.   Family Psych History: She denies any history of any mental illness or substance use in the past.   Susbtance Abuse History: The patient denies any history of any heavy alcohol use or illicit drug use in the past.   Legal History: The patient currently has 2 pending charges, one for assault on a government official and one for speeding. She missed her court date last week.   Diagnosis:   Patient Active Problem List   Diagnosis Date Noted  . Schizoaffective disorder (HCC) [F25.9] 07/19/2016  . 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 Schizoaffective disorder, bipolar type (HCC) [F25.0] 05/04/2016   Total Time spent with patient: 20 minutes   Past Medical History:  Past Medical History:  Diagnosis Date  . Asthma   . Depression    History reviewed. No pertinent surgical history. Family History: History reviewed. No pertinent family history.  Social History:  History  Alcohol Use No  History  Drug Use No    Social History   Social History  . Marital status: Single    Spouse name: N/A  . Number of children: N/A  . Years of education: N/A   Social History Main Topics  . Smoking status: Never Smoker  . Smokeless tobacco: Never Used  . Alcohol use No  . Drug use: No  . Sexual activity: Not Asked   Other Topics Concern   . None   Social History Narrative  . None   Additional Social History:    History of alcohol / drug use?: No history of alcohol / drug abuse     Sleep: Good  Appetite:  Good  Current Medications: Current Facility-Administered Medications  Medication Dose Route Frequency Provider Last Rate Last Dose  . acetaminophen (TYLENOL) tablet 650 mg  650 mg Oral Q6H PRN Brandy HaleUzma Faheem, MD      . alum & mag hydroxide-simeth (MAALOX/MYLANTA) 200-200-20 MG/5ML suspension 30 mL  30 mL Oral Q4H PRN Brandy HaleUzma Faheem, MD      . Melene Muller[START ON 07/22/2016] citalopram (CELEXA) tablet 20 mg  20 mg Oral Daily Darliss RidgelAarti K Kapur, MD      . magnesium hydroxide (MILK OF MAGNESIA) suspension 30 mL  30 mL Oral Daily PRN Brandy HaleUzma Faheem, MD      . risperiDONE (RISPERDAL) tablet 1 mg  1 mg Oral QHS Brandy HaleUzma Faheem, MD        Lab Results: No results found for this or any previous visit (from the past 48 hour(s)).  Blood Alcohol level:  Lab Results  Component Value Date   ETH <5 07/19/2016   ETH <5 05/03/2016    Metabolic Disorder Labs: No results found for: HGBA1C, MPG No results found for: PROLACTIN No results found for: CHOL, TRIG, HDL, CHOLHDL, VLDL, LDLCALC  Physical Findings: AIMS: Facial and Oral Movements Muscles of Facial Expression: None, normal Lips and Perioral Area: None, normal Jaw: None, normal Tongue: None, normal,Extremity Movements Upper (arms, wrists, hands, fingers): None, normal Lower (legs, knees, ankles, toes): None, normal, Trunk Movements Neck, shoulders, hips: None, normal, Overall Severity Severity of abnormal movements (highest score from questions above): None, normal Incapacitation due to abnormal movements: None, normal Patient's awareness of abnormal movements (rate only patient's report): No Awareness, Dental Status Current problems with teeth and/or dentures?: No Does patient usually wear dentures?: No  CIWA:    COWS:     Musculoskeletal: Strength & Muscle Tone: within normal  limits Gait & Station: normal Patient leans: N/A  Psychiatric Specialty Exam: Physical Exam  Review of Systems  Constitutional: Negative.  Negative for chills, diaphoresis, fever, malaise/fatigue and weight loss.  HENT: Negative.  Negative for ear pain, hearing loss and tinnitus.   Eyes: Negative.  Negative for blurred vision, pain and redness.  Respiratory: Negative.  Negative for cough, hemoptysis, sputum production, shortness of breath and wheezing.   Cardiovascular: Negative.  Negative for chest pain, palpitations, claudication, leg swelling and PND.  Gastrointestinal: Negative.  Negative for abdominal pain, constipation, diarrhea, heartburn, nausea and vomiting.  Genitourinary: Negative.  Negative for dysuria, frequency and urgency.  Musculoskeletal: Negative.  Negative for back pain, falls, joint pain, myalgias and neck pain.  Skin: Negative.  Negative for itching and rash.  Neurological: Negative.  Negative for dizziness, tingling, tremors, sensory change, speech change, focal weakness, seizures, loss of consciousness, weakness and headaches.  Endo/Heme/Allergies: Negative.  Negative for environmental allergies. Does not bruise/bleed easily.    Blood pressure 114/67, pulse 72, temperature  98.7 F (37.1 C), temperature source Oral, resp. rate 18, height 5\' 7"  (1.702 m), weight 59 kg (130 lb), last menstrual period 06/27/2016, SpO2 100 %.Body mass index is 20.36 kg/m.  General Appearance: Casual  Eye Contact:  Good  Speech:  Clear and Coherent  Volume:  Decreased  Mood:  Depressed  Affect:  Depressed  Thought Process:  Goal Directed and Linear  Orientation:  Full (Time, Place, and Person)  Thought Content:  Logical  Suicidal Thoughts:  No  Homicidal Thoughts:  No  Memory:  Immediate;   Good Recent;   Good Remote;   Good  Judgement:  Impaired  Insight:  Lacking  Psychomotor Activity:  Decreased  Concentration:  Concentration: Fair and Attention Span: Fair  Recall:  [employer]Fair   Fund of Knowledge:  Fair  Language:  Good  Akathisia:  No  Handed:  Right  AIMS (if indicated):     Assets:  Communication Skills Desire for Improvement Physical Health  ADL's:  Intact  Cognition:  WNL  Sleep:  Number of Hours: 7     Treatment Plan Summary:  [name] Is a 20 year old single female with history of schizoaffective disorder who was admitted after she tried to hang herself in the basement of her apartment complex. She was admitted to inpatient psychiatry for medication management, safety and stabilization.  Schizoaffective disorder, bipolar type: The patient has been resistant to taking any antidepressant medications out of fear of side effects. She was willing to start Celexa 20 mg by mouth daily for depression. She was also started on Risperdal 1 mg by mouth nightly for paranoid thoughts that were present at admission per Dr.Faheem. Will check HgA1c, lipid panel and prolactin level as well as EKG to R/O QTc Prolongation  We'll continue supportive psychotherapy with medication and rounding. The patient does need to develop a support system.  Will try to encourage the patient to allow contact with family to get collateral information. She is currently refusing any contact.  Disposition: She says that she has a stable living situation. She will need psychotropic medication management followup.   Daily contact with patient to assess and evaluate symptoms and progress in treatment and Medication management  Levora AngelKAPUR,AARTI KAMAL, MD 07/21/2016, 5:32 PM

## 2016-07-21 NOTE — Plan of Care (Signed)
Problem: Medication: Goal: Compliance with prescribed medication regimen will improve Outcome: Not Progressing Pt refuses medications. Attended group today.

## 2016-07-21 NOTE — Progress Notes (Signed)
D: Pt approaches writer in medication room stating "I want to refuse my medications tonight." Pt isolative to her room otherwise. Upon further investigation, pt states "I don't want to take my meds I just want to learn coping skills." Pt affect is sad. She denies SI/HI/AVH at this time and contracts for safety. A: Emotional support and encouragement provided. Pt educated on the importance of taking medications as prescribed. Pt encouraged to attend groups in order to learn coping skills. q15 minute safety checks maintained. R: Pt remains free from harm. Will continue to monitor.

## 2016-07-21 NOTE — Progress Notes (Signed)
Pt continues to refuse medications ordered. Denies suicidal ideation. Isolative to room. Calm and cooperative otherwise. Safety maintained with every 15 minute checks. Will continue to monitor.

## 2016-07-21 NOTE — [facility] Group Notes (Signed)
[facility] LCSW Group Therapy  07/21/2016 2:14 PM  Type of Therapy:  Group Therapy  Participation Level:  Minimal  Participation Quality:  Attentive  Affect:  Appropriate  Cognitive:  Alert  Insight:  Limited  Engagement in Therapy:  Limited  Modes of Intervention:  Activity, Discussion, Education and Support  Summary of Progress/Problems: Coping Skills: Patients defined and discussed healthy coping skills. Patients identified healthy coping skills they would like to try during hospitalization and after discharge. CSW offered insight to varying coping skills that may have been new to patients such as practicing mindfulness. Patient attended group on this date and minimally participated in the group discussion but was willing to share with the group when prompted by the CSW.   Jaylee Freeze G. Garnette CzechSampson MSW, LCSWA 07/21/2016, 2:16 PM

## 2016-07-21 NOTE — [facility] Group Notes (Signed)
[facility] Group Notes:  (Nursing/MHT/Case Management/Adjunct)  Date:  07/21/2016  Time:  11:31 PM  Type of Therapy:  Psychoeducational Skills  Participation Level:  Active  Participation Quality:  Appropriate, Attentive and Sharing  Affect:  Appropriate, Blunted and Flat  Cognitive:  Appropriate and Oriented  Insight:  Good  Engagement in Group:  Engaged  Modes of Intervention:  Discussion and Education  Summary of Progress/Problems:  Foy GuadalajaraJasmine R Desiree Fleming 07/21/2016, 11:31 PM

## 2016-07-22 LAB — LIPID PANEL
Cholesterol: 126 mg/dL (ref 0–200)
HDL: 47 mg/dL (ref >40)
LDL Cholesterol: 69 mg/dL (ref 0–99)
Total CHOL/HDL Ratio: 2.7 RATIO
Triglycerides: 49 mg/dL (ref <150)
VLDL: 10 mg/dL (ref 0–40)

## 2016-07-22 NOTE — Plan of Care (Signed)
Problem: Safety: Goal: Ability to remain free from injury will improve Outcome: Progressing Pt has remained free from injury.  Problem: Pain Managment: Goal: General experience of comfort will improve Outcome: Progressing Pt has denied experiencing any discomfort.  Problem: Physical Regulation: Goal: Will remain free from infection Outcome: Progressing Pt has remained free form infection.  Problem: Education: Goal: Ability to make informed decisions regarding treatment will improve Outcome: Not Progressing Pt is not able to make informed decisions regarding treatment. Is non-compliant with medications.

## 2016-07-22 NOTE — Progress Notes (Signed)
[facility] MD Progress Note  07/22/2016 1:57 PM Melissa [name]  MRN:  [id number] Subjective:  Principal Problem: Major depression and suicidal thoughts   The patient has both Celexa and Risperdal ordered but has been refusing medications. She did not take any Celexa or Risperdal yesterday even though she verbally agreed to take the medication. She denies any current auditory or visual hallucinations and says she is feeling better terms of her mood. She is denying any current active or passive suicidal thoughts but was very vague when asked about regrets of suicide attempt. The patient has been reclusive to her room this morning and has not attended any afternoon groups. She denies any somatic complaints. Vital signs have been stable. She only slept 4 hours but has been sleeping on and off throughout the day.  The patient reports that prior to admission, she had some brief suicidal thoughts and wanted to hang herself. She has been estranged from her family since she moved to [place] and is not allowing this Clinical research associate or the treatment team to contact her family. 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 She says her landlord allows her to stay there for free currently. Her father, 2 brothers and a sister live in [place] but she says she has no contact with them. She denies any current active or passive suicidal thoughts but mood remains depressed. She denies any auditory or visual hallucinations. She did not exhibit any overt paranoid thoughts today but was started on Risperdal by Dr. Garnetta Buddy. She has not been compliant with either Risperdal or Celexa started by Dr. Garnetta Buddy. She says she does not want to take medications because she fears side effects and wants to just work on improving coping skills.   Supportive psychotherapy provided and times spent discussing coping skills when she is feeling down, depressed or suicidal. Times spent encouraging the  patient also developed a support system and increased social interactions with others.   Past Psychiatric History: The patient reports that she has been hospitalized a number times before [facility] and [place]. She was supposed to have outpatient treatment at [facility] but has not been fully compliant. She does report a history of suicidal thoughts in the past but no other suicide attempts. She says she is tried multiple episodes in the past but could not name the medications.   Social History: The patient was born and raised in [place], [place] by her parents until they divorced. Her mother still lives in [place] and her father now lives in [place]. She also has 2 brothers and one sister in [place]. She is estranged from her father and siblings and does not have a close relationship with them. She did get a list [employer] for close to 2 years before dropping out. 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 She currently lives in a boarding house and says her landlord has been taking her rent.   Family Psych History: She denies any history of any mental illness or substance use in the past.   Susbtance Abuse History: The patient denies any history of any heavy alcohol use or illicit drug use in the past.   Legal History: The patient currently has 2 pending charges, one for assault on a government official and one for speeding. She missed her court date last week.   Diagnosis:   Patient Active Problem List  Diagnosis Date Noted  . Schizoaffective disorder (HCC) [F25.9] 07/19/2016  . 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 Schizoaffective disorder, bipolar type (HCC) [F25.0] 05/04/2016   Total Time spent with patient: 20 minutes   Past Medical History:  Past Medical History:  Diagnosis Date  . Asthma   . Depression    History reviewed. No  pertinent surgical history. Family History: History reviewed. No pertinent family history.  Social History:  History  Alcohol Use No     History  Drug Use No    Social History   Social History  . Marital status: Single    Spouse name: N/A  . Number of children: N/A  . Years of education: N/A   Social History Main Topics  . Smoking status: Never Smoker  . Smokeless tobacco: Never Used  . Alcohol use No  . Drug use: No  . Sexual activity: Not Asked   Other Topics Concern  . None   Social History Narrative  . None   Additional Social History:    History of alcohol / drug use?: No history of alcohol / drug abuse     Sleep: Fair. Sleeping during the daytime.  Appetite:  Good  Current Medications: Current Facility-Administered Medications  Medication Dose Route Frequency Provider Last Rate Last Dose  . acetaminophen (TYLENOL) tablet 650 mg  650 mg Oral Q6H PRN Brandy Hale, MD      . alum & mag hydroxide-simeth (MAALOX/MYLANTA) 200-200-20 MG/5ML suspension 30 mL  30 mL Oral Q4H PRN Brandy Hale, MD      . citalopram (CELEXA) tablet 20 mg  20 mg Oral Daily Darliss Ridgel, MD      . magnesium hydroxide (MILK OF MAGNESIA) suspension 30 mL  30 mL Oral Daily PRN Brandy Hale, MD      . risperiDONE (RISPERDAL) tablet 1 mg  1 mg Oral QHS Brandy Hale, MD        Lab Results:  Results for orders placed or performed during the hospital encounter of 07/19/16 (from the past 48 hour(s))  Lipid panel     Status: None   Collection Time: 07/22/16  7:16 AM  Result Value Ref Range   Cholesterol 126 0 - 200 mg/dL   Triglycerides 49 <782 mg/dL   HDL 47 >95 mg/dL   Total CHOL/HDL Ratio 2.7 RATIO   VLDL 10 0 - 40 mg/dL   LDL Cholesterol 69 0 - 99 mg/dL    Comment:        Total Cholesterol/HDL:CHD Risk Coronary Heart Disease Risk Table                     Men   Women  1/2 Average Risk   3.4   3.3  Average Risk       5.0   4.4  2 X Average Risk   9.6   7.1  3 X Average Risk  23.4    11.0        Use the calculated Patient Ratio above and the CHD Risk Table to determine the patient's CHD Risk.        ATP III CLASSIFICATION (LDL):  <100     mg/dL   Optimal  621-308  mg/dL   Near or Above                    Optimal  130-159  mg/dL   Borderline  657-846  mg/dL   High  >  190     mg/dL   Very High     Blood Alcohol level:  Lab Results  Component Value Date   ETH <5 07/19/2016   ETH <5 05/03/2016    Metabolic Disorder Labs: No results found for: HGBA1C, MPG No results found for: PROLACTIN Lab Results  Component Value Date   CHOL 126 07/22/2016   TRIG 49 07/22/2016   HDL 47 07/22/2016   CHOLHDL 2.7 07/22/2016   VLDL 10 07/22/2016   LDLCALC 69 07/22/2016    Physical Findings: AIMS: Facial and Oral Movements Muscles of Facial Expression: None, normal Lips and Perioral Area: None, normal Jaw: None, normal Tongue: None, normal,Extremity Movements Upper (arms, wrists, hands, fingers): None, normal Lower (legs, knees, ankles, toes): None, normal, Trunk Movements Neck, shoulders, hips: None, normal, Overall Severity Severity of abnormal movements (highest score from questions above): None, normal Incapacitation due to abnormal movements: None, normal Patient's awareness of abnormal movements (rate only patient's report): No Awareness, Dental Status Current problems with teeth and/or dentures?: No Does patient usually wear dentures?: No  CIWA:    COWS:     Musculoskeletal: Strength & Muscle Tone: within normal limits Gait & Station: normal Patient leans: N/A  Psychiatric Specialty Exam: Physical Exam   Review of Systems  Constitutional: Negative.  Negative for chills, diaphoresis, fever, malaise/fatigue and weight loss.  HENT: Negative.  Negative for ear pain, hearing loss and tinnitus.   Eyes: Negative.  Negative for blurred vision, pain and redness.  Respiratory: Negative.  Negative for cough, hemoptysis, sputum production, shortness of breath  and wheezing.   Cardiovascular: Negative.  Negative for chest pain, palpitations, claudication, leg swelling and PND.  Gastrointestinal: Negative.  Negative for abdominal pain, constipation, diarrhea, heartburn, nausea and vomiting.  Genitourinary: Negative.  Negative for dysuria, frequency and urgency.  Musculoskeletal: Negative.  Negative for back pain, falls, joint pain, myalgias and neck pain.  Skin: Negative.  Negative for itching and rash.  Neurological: Negative.  Negative for dizziness, tingling, tremors, sensory change, speech change, focal weakness, seizures, loss of consciousness, weakness and headaches.  Endo/Heme/Allergies: Negative.  Negative for environmental allergies. Does not bruise/bleed easily.    Blood pressure 101/70, pulse 68, temperature 98.9 F (37.2 C), temperature source Oral, resp. rate 20, height 5\' 7"  (1.702 m), weight 59 kg (130 lb), last menstrual period 06/27/2016, SpO2 100 %.Body mass index is 20.36 kg/m.  General Appearance: Casual  Eye Contact:  Good  Speech:  Clear and Coherent  Volume:  Decreased  Mood:  Depressed  Affect:  Depressed  Thought Process:  Goal Directed and Linear  Orientation:  Full (Time, Place, and Person)  Thought Content:  Logical  Suicidal Thoughts:  No  Homicidal Thoughts:  No  Memory:  Immediate;   Good Recent;   Good Remote;   Good  Judgement:  Impaired  Insight:  Lacking  Psychomotor Activity:  Decreased  Concentration:  Concentration: Fair and Attention Span: Fair  Recall:  [employer]  Fund of Knowledge:  Fair  Language:  Good  Akathisia:  No  Handed:  Right  AIMS (if indicated):     Assets:  Communication Skills Desire for Improvement Physical Health  ADL's:  Intact  Cognition:  WNL  Sleep:  Number of Hours: 4.3     Treatment Plan Summary:  Melissa [name] Is a 20 year old single female with history of schizoaffective disorder who was admitted after she tried to hang herself in the basement of her apartment complex.  She was admitted to  inpatient psychiatry for medication management, safety and stabilization.  Schizoaffective disorder, bipolar type: The patient has been resistant to taking any antidepressant medications out of fear of side effects. She was initially willing to start Celexa 20 mg by mouth daily for depression but has refused the medication since admission. She was also started on Risperdal 1 mg by mouth nightly for paranoid thoughts that were present at admission per Dr.Faheem. She has refused the Risperdal as well. Total cholesterol was 126. HgA1c and prolactin are pending (She however has not started the Risperdal). EKG did not show any QTc Prolongation  We'll continue supportive psychotherapy with medication and rounding. The patient does need to develop a support system.  Will try to encourage the patient to allow contact with family to get collateral information. She is currently refusing any contact.  Disposition: She says that she has a stable living situation. She will need psychotropic medication management followup. She remains at a high risk for suicide after discharge given her lack of primary support and refusal to comply with medications.   Daily contact with patient to assess and evaluate symptoms and progress in treatment and Medication management  Levora AngelKAPUR,AARTI KAMAL, MD 07/22/2016, 1:57 PM

## 2016-07-22 NOTE — Plan of Care (Signed)
Problem: Safety: Goal: Ability to remain free from injury will improve Outcome: Progressing Patient has been free from injury   

## 2016-07-22 NOTE — Progress Notes (Signed)
Pt is alert and oriented, respirations even and unlabored, gait steady and unassisted, no acute distress noted. No complaints voiced. Denies SI/HI/AVH, anxiety and depression. No negative behaviors observed. Refused HS dose of Risperdal x 3 attempts because "I don't like taking medication." Participated in group and stated "I want to meet more for people here." Ate a snack, watched television until midnight and went to bed. Remains on q15 minute observation checks for safety. Will continue to monitor.

## 2016-07-22 NOTE — [facility] Group Notes (Signed)
[facility] LCSW Group Therapy  07/22/2016 2:40 PM  Type of Therapy:  Group Therapy  Participation Level:  Patient did not attend group. CSW invited patient to group.   Summary of Progress/Problems:Stress management: Patients defined and discussed the topic of stress and the related symptoms and triggers for stress. Patients identified healthy coping skills they would like to try during hospitalization and after discharge to manage stress in a healthy way. CSW offered insight to varying stress management techniques.   Eusebia Grulke G. Garnette CzechSampson MSW, LCSWA 07/22/2016, 2:41 PM

## 2016-07-22 NOTE — Progress Notes (Signed)
Patient has been refusing medications and isolative to her room. Very pleasant, she just feels like she does not need medication. Denies SI/HI or AVH. Does not want to talk much. Did not attend groups. Took a shower and got right back in the bed. Remains on Q15 minute checks for safety. Will continue to monitor.

## 2016-07-23 LAB — HEMOGLOBIN A1C
Hgb A1c MFr Bld: 4.8 % (ref 4.8–5.6)
Mean Plasma Glucose: 91 mg/dL

## 2016-07-23 LAB — PROLACTIN: Prolactin: 6.6 ng/mL (ref 4.8–23.3)

## 2016-07-23 MED ORDER — SERTRALINE HCL 25 MG PO TABS
12.5000 mg | ORAL_TABLET | Freq: Every day | ORAL | Status: DC
  Administered 2016-07-23 – 2016-07-24 (×2): 12.5 mg via ORAL
  Filled 2016-07-23: qty 0.5
  Filled 2016-07-23: qty 1
  Filled 2016-07-23: qty 0.5

## 2016-07-23 MED ORDER — SERTRALINE HCL 25 MG PO TABS: 12.5000 mg | ORAL_TABLET | Freq: Every day | ORAL | Status: DC

## 2016-07-23 MED ORDER — OLANZAPINE 5 MG PO TBDP
5.0000 mg | ORAL_TABLET | Freq: Every day | ORAL | Status: DC
  Administered 2016-07-23 – 2016-07-24 (×2): 5 mg via ORAL
  Filled 2016-07-23 (×2): qty 1

## 2016-07-23 MED ORDER — OLANZAPINE 5 MG PO TBDP: 5.0000 mg | ORAL_TABLET | Freq: Every day | ORAL | Status: DC
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
## 2016-07-23 NOTE — Social Work (Signed)
CSW received permission from patient to contact Ronnie @ [phone], a friend that patient was living with before hospitalization. Christen BameRonnie stated that patient was staying with him as a "kind gesture" and he is not equipped to work with pt when she becomes suicidal. He stated that patient has family who can assist with housing but Christen BameRonnie stated that he is not willing to take patient back after this suicidal attempt. CSW will meet with patient to inquire about contacting her parents in [place], [place].    Hampton AbbotKadijah Flecia Shutter, MSW, LCSW-A 07/23/2016, 10:23AM

## 2016-07-23 NOTE — Progress Notes (Signed)
Patient is isolated in her room most of the shift.Denies suicidal or homicidal ideations & AV hallucinations.Patient refused to take her medicines.States "I will try to improve my coping skills."Did not attended groups.Minimal interactions with peers.Later today with much encouragement she took the medicines.Appetite & energy level good.Support & encouragement given.

## 2016-07-23 NOTE — Plan of Care (Signed)
Problem: Medication: Goal: Compliance with prescribed medication regimen will improve Outcome: Progressing Patient took her medicines.

## 2016-07-23 NOTE — Plan of Care (Signed)
Problem: Education: Goal: Ability to make informed decisions regarding treatment will improve Outcome: Not Progressing Patient refusing medications at this time and not attending group ,isolative at this time [employer] RN

## 2016-07-23 NOTE — Progress Notes (Signed)
D: Patient is alert and oriented on the unit this shift. Patient not  attended and actively participated in groups today. Patient denies suicidal ideation, homicidal ideation, auditory or visual hallucinations at the present time.  A: Scheduled medications are administered to patient as per MD orders. Emotional support and encouragement are provided. Patient is maintained on q.15 minute safety checks. Patient is informed to notify staff with questions or concerns. R: No adverse medication reactions are noted. Patient is cooperative with medication administration and treatment plan today. Patient is receptive, calm and non cooperative on the unit at this time. Patient not interacts well with others on the unit this shift. Patient contracts for safety at this time. Patient remains safe at this time.anxiety 1/10,depression 1/10

## 2016-07-24 MED ORDER — SERTRALINE HCL 25 MG PO TABS
12.5000 mg | ORAL_TABLET | Freq: Every day | ORAL | Status: DC
  Administered 2016-07-25 – 2016-07-28 (×4): 12.5 mg via ORAL
  Filled 2016-07-24 (×3): qty 1
  Filled 2016-07-24: qty 2
  Filled 2016-07-24: qty 1

## 2016-07-24 MED ORDER — OLANZAPINE 5 MG PO TBDP
7.5000 mg | ORAL_TABLET | Freq: Every day | ORAL | Status: DC
  Administered 2016-07-25 – 2016-07-28 (×4): 7.5 mg via ORAL
  Filled 2016-07-24 (×2): qty 1
  Filled 2016-07-24 (×3): qty 2

## 2016-07-24 NOTE — Progress Notes (Signed)
Recreation Therapy Notes  Date: 11.28.17 Time: 1:00 pm Location: Craft Room  Group Topic: Self-expression  Goal Area(s) Addresses:  Patient will identify one color per emotion listed on the wheel. Patient will verbalize one emotion experienced during session. Patient will be educated on other forms of self-expression  Behavioral Response: Did not attend  Intervention: Emotion Wheel  Activity: Patients were given an Emotion Wheel worksheet and were instructed to pick a color for each emotion listed on the wheel.  Education: LRT educated patients on different forms of self-expression.  Education Outcome: Patient did not attend group.   Clinical Observations/Feedback: Patient did not attend group.  Hulet Ehrmann M, LRT/CTRS 07/24/2016 3:59 PM 
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
## 2016-07-24 NOTE — Plan of Care (Signed)
Problem: Coping: Goal: Ability to cope will improve Outcome: Progressing Emotional support is provided to pt. No negative behaviors noted.  Problem: Medication: Goal: Compliance with prescribed medication regimen will improve Outcome: Not Progressing Refuses medications.  Problem: Self-Concept: Goal: Ability to verbalize positive feelings about self will improve Outcome: Progressing Pt is able to verbalize feelings to staff, and denies feeling suicidal or homicidal.

## 2016-07-24 NOTE — Progress Notes (Signed)
Patient remains isolative to room. Pt asked if she could refuse medications. Informed patient that she is on forced med and if she didn't take she would have to get injection. Pt agreed to take medications. Pt does not attend group, and isolates to self and room. Denies SI, HI, AVH.  Encouragement and support offered. Medications given as prescribed. Encouraged patient to attend groups.  Pt receptive and remains safe on unit with q 15 min checks.

## 2016-07-24 NOTE — Plan of Care (Signed)
Problem: Self-Concept: Goal: Ability to verbalize positive feelings about self will improve Outcome: Not Progressing Patient continues to isolate to self or room. Offers little

## 2016-07-24 NOTE — Progress Notes (Signed)
Pt is alert and oriented x 4, respirations even and unlabored with no acute distress noted. Pt denies SI/HI/AVH, anxiety and depression stating "no ma'am, I'm fine." No acute distress noted. Pt remains isolative to room, only coming out for vitals and meals. Makes no attempt to socialize with peers, but will report feelings to staff. Is non-compliant with medications and groups. No complaints voiced. Remains of q15 minute observation checks for safety. Will continue to monitor.

## 2016-07-24 NOTE — [facility] Group Notes (Signed)
[facility] Group Notes:  (Nursing/MHT/Case Management/Adjunct)  Date:  07/24/2016  Time:  1:49 PM  Type of Therapy:  Psychoeducational Skills  Participation Level:  Did Not Attend  Twanna Hymanda C Mehdi Gironda 07/24/2016, 1:49 PM

## 2016-07-25 NOTE — Plan of Care (Signed)
Problem: Coping: Goal: Ability to cope will improve Outcome: Progressing Patient has been out of room more and engaging with others

## 2016-07-25 NOTE — Progress Notes (Signed)
Patient has been attending groups and interacting with peers. Denies SI/HI or AVH. Still flat and not talking much about why she is here. Remains on Q15 minute checks for safety. Will continue to monitor.

## 2016-07-25 NOTE — [facility] Group Notes (Signed)
[facility] Group Notes:  (Nursing/MHT/Case Management/Adjunct)  Date:  07/25/2016  Time:  3:44 AM  Type of Therapy:  Group Therapy  Participation Level:  Active  Participation Quality:  Appropriate  Affect:  Appropriate  Cognitive:  Appropriate  Insight:  Good and Improving  Engagement in Group:  Developing/Improving and Engaged  Modes of Intervention:  Discussion  Summary of Progress/Problems: Pt stated that she had not set a goal, but she had felt good during the day. Staff observed pt smiling and in seemingly good spirits.  Fanny Skatesshley Imani Aivah Putman 07/25/2016, 3:44 AM
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
## 2016-07-25 NOTE — Progress Notes (Signed)
D: Patient appears flat. States she's here because she "got caught" trying to kill herself. Denies any SI currently. Also denies HI/AVH. Denies pain. Isolates to room. Went to group and snack. Stated in group that she was starting to feel better. A: No medications scheduled. Encouragement given to get up for snack and group. R: Patient was cooperative with going to group and snack. She has remained calm and cooperative. Safety maintained with 15 min checks.

## 2016-07-25 NOTE — [facility] Group Notes (Signed)
[facility] LCSW Group Therapy  07/25/2016 11:28 AM  Type of Therapy:  Group Therapy  Participation Level:  Patient did not attend group. CSW invited patient to group.   Summary of Progress/Problems:Emotional Regulation: Patients will identify both negative and positive emotions. They will discuss emotions they have difficulty regulating and how they impact their lives. Patients will be asked to identify healthy coping skills to combat unhealthy reactions to negative emotions.     Melissa [name] G. Garnette CzechSampson MSW, LCSWA 07/25/2016, 11:28 AM

## 2016-07-25 NOTE — Progress Notes (Signed)
Recreation Therapy Notes  Date: 11.29.17 Time: 1:00 pm Location: Craft Room  Group Topic: Self-esteem  Goal Area(s) Addresses:  Patient will write at least one positive trait about self. Patient will verbalize benefit of having healthy self-esteem.  Behavioral Response: Attentive, Interactive  Intervention: I Am  Activity: Patients were given a worksheet with the letter I on it and were instructed to write positive traits about themselves inside the letter.  Education: LRT educated patients on ways to increase their self-esteem.  Education Outcome: In group clarification offered  Clinical Observations/Feedback: Patient wrote positive traits about self. Patient contributed to group discussion by stating how her self-esteem affects her.  Jacquelynn CreeGreene,Jarika Robben M, LRT/CTRS 07/25/2016 2:22 PM

## 2016-07-25 NOTE — [facility] Group Notes (Signed)
[facility] Group Notes:  (Nursing/MHT/Case Management/Adjunct)  Date:  07/25/2016  Time:  4:07 PM  Type of Therapy:  Group Therapy  Participation Level:  Active  Participation Quality:  Appropriate and Attentive  Affect:  Appropriate  Cognitive:  Alert and Appropriate  Insight:  Appropriate  Engagement in Group:  Improving  Modes of Intervention:  Activity  Summary of Progress/Problems:  Melissa [name] 07/25/2016, 4:07 PM

## 2016-07-25 NOTE — Tx Team (Signed)
Interdisciplinary Treatment and Diagnostic Plan Update  07/25/2016 Time of Session: 11:00 AM Herby Abrahamiarra A Jubb MRN: [id number]  Principal Diagnosis: Schizoaffective disorder, depressive type (HCC)  Secondary Diagnoses: Principal Problem:   Schizoaffective disorder, depressive type (HCC)   Current Medications:  Current Facility-Administered Medications  Medication Dose Route Frequency Provider Last Rate Last Dose  . acetaminophen (TYLENOL) tablet 650 mg  650 mg Oral Q6H PRN Brandy HaleUzma Faheem, MD      . alum & mag hydroxide-simeth (MAALOX/MYLANTA) 200-200-20 MG/5ML suspension 30 mL  30 mL Oral Q4H PRN Brandy HaleUzma Faheem, MD      . magnesium hydroxide (MILK OF MAGNESIA) suspension 30 mL  30 mL Oral Daily PRN Brandy HaleUzma Faheem, MD      . OLANZapine zydis (ZYPREXA) disintegrating tablet 7.5 mg  7.5 mg Oral QHS Jimmy FootmanAndrea Hernandez-Gonzalez, MD      . sertraline (ZOLOFT) tablet 12.5 mg  12.5 mg Oral QHS Jimmy FootmanAndrea Hernandez-Gonzalez, MD       PTA Medications: No prescriptions prior to admission.    Patient Stressors: Paediatric nurseinancial difficulties Legal issue Marital or family conflict Occupational concerns  Patient Strengths: Capable of independent living Wellsite geologistCommunication skills General fund of knowledge Motivation for treatment/growth Work skills  Treatment Modalities: Medication Management, Group therapy, Case management,  1 to 1 session with clinician, Psychoeducation, Recreational therapy.   Physician Treatment Plan for Primary Diagnosis: Schizoaffective disorder, depressive type (HCC) Long Term Goal(s): Improvement in symptoms so as ready for discharge Improvement in symptoms so as ready for discharge   Short Term Goals: Ability to identify changes in lifestyle to reduce recurrence of condition will improve Ability to identify changes in lifestyle to reduce recurrence of condition will improve  Medication Management: Evaluate patient's response, side effects, and tolerance of medication  regimen.  Therapeutic Interventions: 1 to 1 sessions, Unit Group sessions and Medication administration.  Evaluation of Outcomes: Progressing  Physician Treatment Plan for Secondary Diagnosis: Principal Problem:   Schizoaffective disorder, depressive type (HCC)  Long Term Goal(s): Improvement in symptoms so as ready for discharge Improvement in symptoms so as ready for discharge   Short Term Goals: Ability to identify changes in lifestyle to reduce recurrence of condition will improve Ability to identify changes in lifestyle to reduce recurrence of condition will improve     Medication Management: Evaluate patient's response, side effects, and tolerance of medication regimen.  Therapeutic Interventions: 1 to 1 sessions, Unit Group sessions and Medication administration.  Evaluation of Outcomes: Progressing   RN Treatment Plan for Primary Diagnosis: Schizoaffective disorder, depressive type (HCC) Long Term Goal(s): Knowledge of disease and therapeutic regimen to maintain health will improve  Short Term Goals: Ability to remain free from injury will improve, Ability to demonstrate self-control, Ability to verbalize feelings will improve, Ability to identify and develop effective coping behaviors will improve and Compliance with prescribed medications will improve  Medication Management: RN will administer medications as ordered by provider, will assess and evaluate patient's response and provide education to patient for prescribed medication. RN will report any adverse and/or side effects to prescribing provider.  Therapeutic Interventions: 1 on 1 counseling sessions, Psychoeducation, Medication administration, Evaluate responses to treatment, Monitor vital signs and CBGs as ordered, Perform/monitor CIWA, COWS, AIMS and Fall Risk screenings as ordered, Perform wound care treatments as ordered.  Evaluation of Outcomes: Progressing   LCSW Treatment Plan for Primary Diagnosis:  Schizoaffective disorder, depressive type (HCC) Long Term Goal(s): Safe transition to appropriate next level of care at discharge, Engage patient in therapeutic group addressing interpersonal  concerns.  Short Term Goals: Engage patient in aftercare planning with referrals and resources, Increase social support, Facilitate acceptance of mental health diagnosis and concerns and Increase skills for wellness and recovery  Therapeutic Interventions: Assess for all discharge needs, 1 to 1 time with Social worker, Explore available resources and support systems, Assess for adequacy in community support network, Educate family and significant other(s) on suicide prevention, Complete Psychosocial Assessment, Interpersonal group therapy.  Evaluation of Outcomes: Progressing   Progress in Treatment: Attending groups: Yes. Participating in groups: Yes. Taking medication as prescribed: Yes. Toleration medication: Yes. Family/Significant other contact made: Yes, individual(s) contacted:  father. Patient understands diagnosis: Yes. Discussing patient identified problems/goals with staff: Yes. Medical problems stabilized or resolved: Yes. Denies suicidal/homicidal ideation: Yes. Issues/concerns per patient self-inventory: Yes.  New problem(s) identified: No, Describe:  None identified.  New Short Term/Long Term Goal(s):  Discharge Plan or Barriers:   Reason for Continuation of Hospitalization: Anxiety Depression Suicidal ideation  Estimated Length of Stay: 7 days   Attendees: Patient: Melissa [name]  07/25/2016 11:26 AM  Physician: Dr. Radene JourneyAndrea Hernandez, MD  07/25/2016 11:26 AM  Nursing: Leonia ReaderPhyllis Cobb, BSN, RN 07/25/2016 11:26 AM  RN Care Manager: Philbert Riserori Terry, BSN, RN 07/25/2016 11:26 AM  Social Worker: Hampton AbbotKadijah Ival Basquez, MSW, LCSW-A 07/25/2016 11:26 AM  Recreational Therapist: Hershal CoriaBeth Greene, LRT, CTRS  07/25/2016 11:26 AM    Scribe for Treatment Team: Lynden OxfordKadijah R Nishanth Mccaughan, LCSWA 07/25/2016 11:26  AM

## 2016-07-26 NOTE — Progress Notes (Signed)
Recreation Therapy Notes  Date: 11.30.17 Time: 9:30 am Location: Craft Room  Group Topic: Leisure Education  Goal Area(s) Addresses:  Patient will identify activities for each letter of the alphabet. Patient will verbalize ability to integrate positive leisure into life post d/c. Patient will verbalize ability to use leisure as a Associate Professorcoping skill.  Behavioral Response: Attentive, Interactive, Left early  Intervention: Leisure Alphabet  Activity: Patients were given a Leisure Information systems managerAlphabet worksheet and were instructed to write healthy leisure activities for each letter of the alphabet.  Education: LRT educated patients on what they need to participate in leisure.  Education Outcome: Patient left before LRT educated group.  Clinical Observations/Feedback: Patient arrived to group at approximately 9:45 am. LRT explained activity. Patient wrote healthy leisure activities. Patient did write "violence". LRT informed patient that was not a healthy leisure activity. Patient talked about violent video games and how sometimes when you are upset it is alright to take it out on younger siblings. LRT told patient that was not healthy. Patient thought of another leisure activity with assistance from nursing student. Patient contributed to group discussion by stating some of her healthy leisure activities. Patient left group at approximately 10:01 am with Dr. Ardyth HarpsHernandez.  Jacquelynn CreeGreene,Aalyah Mansouri M, LRT/CTRS 07/26/2016 10:24 AM

## 2016-07-26 NOTE — Progress Notes (Signed)
Patient ID: Melissa [name], female   DOB: [birth year], 20 y.o.   MRN: [id number] M Ms Melissa [name] has improved a lot from being isolative to her room and now communicating her needs, interacting with others appropriately, denied SI/HI, denied AV/H, A&Ox3, denied pain, asked reasonable questions about medications. She is approaching a functional baseline and may be ready for community re-entry. Will continue to provide clinical and moral support.

## 2016-07-26 NOTE — Progress Notes (Signed)
Pt awake, alert, oriented today. Stays in her room most of the day. Does attend group, but is isolative otherwise. Socialized with student nurse assigned with her earlier this morning, but ended up telling the student nurse she would like to be left alone per the student nurse. This nurse did notice that pt had been completing daily self inventories, but did not turn them in for the past 3 days. On the self inventories, pt had checked that she was having suicidal thoughts intermittently, "sometimes." Today pt verbally denies SI/HI/AVH, and did not complete the self inventory as of this time. Pt continues to appear depressed, sad.   Support and encouragement provided with use of therapeutic communication. No medications scheduled to be administered this shift. Safety maintained with every 15 minute checks. Will continue to monitor.

## 2016-07-26 NOTE — Plan of Care (Signed)
Problem: Safety: Goal: Ability to remain free from injury will improve Outcome: Progressing Medications compliant; no PRN given, slept for estimated hours of 7; 15 minute checks maintained for safety, clinical and moral support provided, patient encouraged to continue to express feelings and demonstrate safe care. Patient remains free from harm, will continue to monitor.

## 2016-07-26 NOTE — [facility] Group Notes (Signed)
[facility] Group Notes:  (Nursing/MHT/Case Management/Adjunct)  Date:  07/26/2016  Time:  4:00 PM  Type of Therapy:  Psychoeducational Skills  Participation Level:  Did Not Attend  Twanna Hymanda C Taneka Espiritu 07/26/2016, 4:00 PM

## 2016-07-26 NOTE — Plan of Care (Signed)
Problem: Self-Concept: Goal: Ability to disclose and discuss suicidal ideas will improve Outcome: Progressing Pt has been attending groups and taking medications per MAR. Does report SI via pt daily self inventory, but verbally denies.
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
## 2016-07-26 NOTE — Progress Notes (Signed)
July 25, 2016.  Patient Identification: Melissa [name] MRN:  [id number] Date of Evaluation:  07/20/2016 Chief Complaint:  Depression Principal Diagnosis: Schizoaffective disorder depressed type.  To [facility]:   Patient is a 20 year old female with history of schizoaffective disorder who was admitted after she tried to hurt herself. She was trying to hang herself in the attic of a friend's house. She reported that she has been becoming more depressed progressively. She reported that she decided to hurt herself and went to the basement and everything was already set. Patient's friend reports that the patient was laughing and crying uncontrollably and talking to herself prior to admission. They also reported the patient had some delusions about having a boyfriend and multiple kids. Patient has been hospitalized twice before this year due to psychosis. She is noncompliant with medications. Patient is not yet stable for discharge and we are recommending to extend her involuntary commitment for up to 30 days. If more information is needed about this case, please do not hesitated to contact me at [phone].  Sincerely,  Radene JourneyAndrea Hernandez M.D. [phone] [facility]

## 2016-07-27 LAB — TSH: TSH: 0.794 u[IU]/mL (ref 0.350–4.500)

## 2016-07-27 LAB — VITAMIN B12: Vitamin B-12: 381 pg/mL (ref 180–914)
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
## 2016-07-27 NOTE — Plan of Care (Signed)
Problem: Self-Concept: Goal: Ability to verbalize positive feelings about self will improve Outcome: Not Progressing Pt forwards very little, responds with short answers. Attends some groups. Isolative to room otherwise.

## 2016-07-27 NOTE — [facility] Group Notes (Signed)
[facility] Group Notes:  (Nursing/MHT/Case Management/Adjunct)  Date:  07/27/2016  Time:  3:52 AM  Type of Therapy:  Psychoeducational Skills  Participation Level:  Active  Participation Quality:  Appropriate, Attentive and Sharing  Affect:  Appropriate  Cognitive:  Appropriate  Insight:  Appropriate and Good  Engagement in Group:  Engaged  Modes of Intervention:  Discussion, Socialization and Support  Summary of Progress/Problems:  Melissa MilroyLaquanda Y Jawaun [name] 07/27/2016, 3:52 AM

## 2016-07-27 NOTE — Tx Team (Signed)
Interdisciplinary Treatment and Diagnostic Plan Update  07/27/2016 Time of Session: 11:00 AM Melissa [name] MRN: [id number]  Principal Diagnosis: Schizoaffective disorder, depressive type (HCC)  Secondary Diagnoses: Principal Problem:   Schizoaffective disorder, depressive type (HCC)   Current Medications:  Current Facility-Administered Medications  Medication Dose Route Frequency Provider Last Rate Last Dose  . acetaminophen (TYLENOL) tablet 650 mg  650 mg Oral Q6H PRN Brandy HaleUzma Faheem, MD      . alum & mag hydroxide-simeth (MAALOX/MYLANTA) 200-200-20 MG/5ML suspension 30 mL  30 mL Oral Q4H PRN Brandy HaleUzma Faheem, MD      . magnesium hydroxide (MILK OF MAGNESIA) suspension 30 mL  30 mL Oral Daily PRN Brandy HaleUzma Faheem, MD      . OLANZapine zydis (ZYPREXA) disintegrating tablet 7.5 mg  7.5 mg Oral QHS Jimmy FootmanAndrea Hernandez-Gonzalez, MD   7.5 mg at 07/26/16 2127  . sertraline (ZOLOFT) tablet 12.5 mg  12.5 mg Oral QHS Jimmy FootmanAndrea Hernandez-Gonzalez, MD   12.5 mg at 07/26/16 2126   PTA Medications: No prescriptions prior to admission.    Patient Stressors: Paediatric nurseinancial difficulties Legal issue Marital or family conflict Occupational concerns  Patient Strengths: Capable of independent living Wellsite geologistCommunication skills General fund of knowledge Motivation for treatment/growth Work skills  Treatment Modalities: Medication Management, Group therapy, Case management,  1 to 1 session with clinician, Psychoeducation, Recreational therapy.   Physician Treatment Plan for Primary Diagnosis: Schizoaffective disorder, depressive type (HCC) Long Term Goal(s): Improvement in symptoms so as ready for discharge Improvement in symptoms so as ready for discharge   Short Term Goals: Ability to identify changes in lifestyle to reduce recurrence of condition will improve Ability to identify changes in lifestyle to reduce recurrence of condition will improve  Medication Management: Evaluate patient's response, side effects, and  tolerance of medication regimen.  Therapeutic Interventions: 1 to 1 sessions, Unit Group sessions and Medication administration.  Evaluation of Outcomes: Progressing  Physician Treatment Plan for Secondary Diagnosis: Principal Problem:   Schizoaffective disorder, depressive type (HCC)  Long Term Goal(s): Improvement in symptoms so as ready for discharge Improvement in symptoms so as ready for discharge   Short Term Goals: Ability to identify changes in lifestyle to reduce recurrence of condition will improve Ability to identify changes in lifestyle to reduce recurrence of condition will improve     Medication Management: Evaluate patient's response, side effects, and tolerance of medication regimen.  Therapeutic Interventions: 1 to 1 sessions, Unit Group sessions and Medication administration.  Evaluation of Outcomes: Progressing   RN Treatment Plan for Primary Diagnosis: Schizoaffective disorder, depressive type (HCC) Long Term Goal(s): Knowledge of disease and therapeutic regimen to maintain health will improve  Short Term Goals: Ability to remain free from injury will improve, Ability to demonstrate self-control, Ability to verbalize feelings will improve, Ability to identify and develop effective coping behaviors will improve and Compliance with prescribed medications will improve  Medication Management: RN will administer medications as ordered by provider, will assess and evaluate patient's response and provide education to patient for prescribed medication. RN will report any adverse and/or side effects to prescribing provider.  Therapeutic Interventions: 1 on 1 counseling sessions, Psychoeducation, Medication administration, Evaluate responses to treatment, Monitor vital signs and CBGs as ordered, Perform/monitor CIWA, COWS, AIMS and Fall Risk screenings as ordered, Perform wound care treatments as ordered.  Evaluation of Outcomes: Progressing   LCSW Treatment Plan for  Primary Diagnosis: Schizoaffective disorder, depressive type (HCC) Long Term Goal(s): Safe transition to appropriate next level of care at discharge, Engage  patient in therapeutic group addressing interpersonal concerns.  Short Term Goals: Engage patient in aftercare planning with referrals and resources, Increase social support, Facilitate acceptance of mental health diagnosis and concerns and Increase skills for wellness and recovery  Therapeutic Interventions: Assess for all discharge needs, 1 to 1 time with Social worker, Explore available resources and support systems, Assess for adequacy in community support network, Educate family and significant other(s) on suicide prevention, Complete Psychosocial Assessment, Interpersonal group therapy.  Evaluation of Outcomes: Progressing   Progress in Treatment: Attending groups: Yes. Participating in groups: Yes. Taking medication as prescribed: Yes. Toleration medication: Yes. Family/Significant other contact made: Yes, individual(s) contacted:  father. Patient understands diagnosis: Yes. Discussing patient identified problems/goals with staff: Yes. Medical problems stabilized or resolved: Yes. Denies suicidal/homicidal ideation: Yes. Issues/concerns per patient self-inventory: Yes.  New problem(s) identified: No, Describe:  None identified.  New Short Term/Long Term Goal(s):  Discharge Plan or Barriers: CSW still assessing for appropriate referrals  Reason for Continuation of Hospitalization: Anxiety Depression Suicidal ideation  Estimated Length of Stay: 7 days   Attendees: Patient:  07/27/2016 11:29 AM  Physician: Dr. Radene JourneyAndrea Hernandez, MD  07/27/2016 11:29 AM  Nursing: Leonia ReaderPhyllis Cobb, BSN, RN 07/27/2016 11:29 AM  RN Care Manager: Philbert Riserori Terry, BSN, RN 07/27/2016 11:29 AM  Social Worker: Hampton AbbotKadijah Grant, MSW, LCSW-A 07/27/2016 11:29 AM  Recreational Therapist: Hershal CoriaBeth Greene, LRT, CTRS  07/27/2016 11:29 AM    Scribe for Treatment  Team: Dorothe PeaJonathan F Janus Vlcek, LCSWA 07/27/2016 11:29 AM

## 2016-07-27 NOTE — Progress Notes (Signed)
Pt awake, alert, oriented on unit today. Isolative to room. Slept through breakfast. Does not attend group. Denies SI/HI/AVH. Encouraged to attend group, go outside, but pt refuses stating, "I want to go, but I don't..." Pt will not elaborate on what she means, just covers her head with her blanket and turns away. Minimal interactions with staff/peers. Appears blunted. Medication complaint with night time medications, see MAR. Pt is calm and cooperative.  Support and encouragement provided with use of therapeutic communication. Safety maintained with every 15 minute checks. Will continue to monitor.

## 2016-07-27 NOTE — Plan of Care (Signed)
Problem: Activity: Goal: Risk for activity intolerance will decrease Outcome: Progressing Patient has been more visible in the milieu and has not been isolating to room.

## 2016-07-27 NOTE — Progress Notes (Signed)
Recreation Therapy Notes  Date: 12.01.17 Time: 9:30 am Location: Craft Room  Group Topic: Coping Skills  Goal Area(s) Addresses:  Patient will participate in healthy coping skill. Patient will verbalize benefit of using art as a coping skill.  Behavioral Response: Did not attend  Intervention: Coloring  Activity: Patients were given coloring sheets and were instructed to think about the emotions they were feeling as well as what their minds were focused on.  Education: LRT educated patients on healthy coping skills.  Education Outcome: Patient did not attend group.  Clinical Observations/Feedback: Patient did not attend group.  Jacquelynn CreeGreene,Ezrael Sam M, LRT/CTRS 07/27/2016 10:21 AM

## 2016-07-27 NOTE — Progress Notes (Signed)
[facility] LCSW Group Therapy  07/27/2016 3:18 PM  Type of Therapy:  Group Therapy  Participation Level:  Active  Participation Quality:  Attentive  Affect:  Blunted  Cognitive:  Appropriate  Insight:  Developing/Improving  Engagement in Therapy:  Developing/Improving  Modes of Intervention:  Discussion, Education, Exploration and Support  Summary of Progress/Problems:LCSW introduced the topic over relapse prevention. This patient was asked to reflect and share warning signs for relapses. Patient was asked to reflect on constructive steps to prevent relapse from occurring. LCSW and peers reviewed positive outcomes and future coping strategies and engaged in an open ended discussion providing support to peers and patient. This patient was abkle to share openly thoughts and feelings and supported her peers.   Arrie SenateBandi, Rusti Arizmendi M 07/27/2016, 3:18 PM

## 2016-07-27 NOTE — [facility] Group Notes (Signed)
[facility] Group Notes:  (Nursing/MHT/Case Management/Adjunct)  Date:  07/27/2016  Time:  4:33 PM  Type of Therapy:  Psychoeducational Skills  Participation Level:  Did Not Attend    ms:  Mickey Farberamela M Moses Ellison 07/27/2016, 4:33 PM

## 2016-07-27 NOTE — Progress Notes (Signed)
D: Patient appears brighter. States she has this feeling like she just doesn't care to do anything. Denies any SI currently. Also denies HI/AVH. Denies pain. She has been more visible in the milieu Went to group and snack. States that she feels like she's been getting better since she's been here.  A: Medication given with education. Encouragement provided. R: Patient was compliant with medication. She has remained calm and cooperative. Safety maintained with 15 min checks.

## 2016-07-28 NOTE — Progress Notes (Signed)
D: Pt is calm and cooperative this evening. She spends most of the evening in the dayroom. Minimal interaction with peers noted. Pt reports that she feels better than she did on arrival. She reports that when feeling depressed she will use the coping skills she has learned, including talking to people, listening to music, and exercising. Pt denies SI/HI/AVH at this time. A: Emotional support and encouragement provided. Medications administered with education. q15 minute safety checks maintained. R: Pt remains free from harm. Will continue to monitor.

## 2016-07-28 NOTE — Progress Notes (Signed)
Patient has been in room most of the day. Comes out for meals. Did attend group and sat and watched tv after dinner, then returned to her room. Denies SI/HI or AVH. Remains on Q15 minute checks. Will continue to monitor.

## 2016-07-28 NOTE — Progress Notes (Signed)
07/28/2016 2pm  Type of Therapy:  Group Therapy  Participation Level:  Active  Participation Quality:  Appropriate  Affect:  Appropriate  Cognitive:  Appropriate  Insight:  Developing/Improving  Engagement in Therapy:  Engaged  Modes of Intervention:  Discussion, Education, Exploration and Support  Type of Therapy/Topic: Group Therapy: Balance in Life    This group will address the concept of balance and how it feels and looks when one is unbalanced. Patients will be encouraged to process areas in their lives that are out of balance, and identify reasons for remaining unbalanced. Facilitators will guide patients utilizing problem- solving interventions to address and correct the stressor making their life unbalanced. Understanding and applying boundaries will be explored and addressed for obtaining and maintaining a balanced life. Patients will be encouraged to explore ways to assertively make their unbalanced needs known to significant others in their lives, using other group members and facilitator for support and feedback.  Therapeutic Goals:  1. Patient will identify two or more emotions or situations they have that consume much of in their lives.  2. Patient will identify signs/triggers that life has become out of balance:  3. Patient will identify two ways to set boundaries in order to achieve balance in their lives:  4. Patient will demonstrate ability to communicate their needs through discussion and/or role plays    Summary of Patient Progress:  This patient was very attentive to her peers and and was able to reflect on times in her life when things were unbalanced and had good insight as to what needs to take place to regain that balance. She reflected she will take a whole day " Princess day" to ensure she gets the proper rest and self care she needs. She was quieter in group today but had good participation.   Melissa SenateBandi, Melissa [name] M 07/28/2016, 2:31 PM
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
## 2016-07-28 NOTE — [facility] Group Notes (Signed)
[facility] Group Notes:  (Nursing/MHT/Case Management/Adjunct)  Date:  07/28/2016  Time:  5:15 AM  Type of Therapy:  Psychoeducational Skills  Participation Level:  Active  Participation Quality:  Appropriate and Attentive  Affect:  Appropriate  Cognitive:  Appropriate  Insight:  Appropriate  Engagement in Group:  Limited  Modes of Intervention:  Discussion, Socialization and Support  Summary of Progress/Problems:  Melissa MilroyLaquanda Y Simona Rocque 07/28/2016, 5:15 AM

## 2016-07-28 NOTE — Plan of Care (Signed)
Problem: Safety: Goal: Ability to remain free from injury will improve Outcome: Progressing Patient has been free from injury   

## 2016-07-29 MED ORDER — SERTRALINE HCL 25 MG PO TABS
25.0000 mg | ORAL_TABLET | Freq: Every day | ORAL | Status: DC
  Administered 2016-07-29 – 2016-08-05 (×6): 25 mg via ORAL
  Filled 2016-07-29 (×6): qty 1

## 2016-07-29 MED ORDER — OLANZAPINE 5 MG PO TBDP
10.0000 mg | ORAL_TABLET | Freq: Every day | ORAL | Status: DC
  Administered 2016-07-29: 10 mg via ORAL
  Filled 2016-07-29: qty 2

## 2016-07-29 NOTE — [facility] Group Notes (Signed)
[facility] Group Notes:  (Nursing/MHT/Case Management/Adjunct)  Date:  07/29/2016  Time:  10:07 PM  Type of Therapy:  Group Therapy  Participation Level:  Active  Participation Quality:  Appropriate  Affect:  Appropriate  Cognitive:  Appropriate  Insight:  Appropriate  Engagement in Group:  Engaged  Modes of Intervention:  Discussion  Summary of Progress/Problems:  Melissa [name] 07/29/2016, 10:07 PM

## 2016-07-29 NOTE — [facility] Group Notes (Signed)
[facility] Group Notes:  (Nursing/MHT/Case Management/Adjunct)  Date:  07/29/2016  Time:  6:11 AM  Type of Therapy:  Psychoeducational Skills  Participation Level:  Active  Participation Quality:  Appropriate  Affect:  Appropriate  Cognitive:  Appropriate  Insight:  Good  Engagement in Group:  Engaged  Modes of Intervention:  Activity  Summary of Progress/Problems:  Melissa [name] 07/29/2016, 6:11 AM

## 2016-07-29 NOTE — [facility] Group Notes (Signed)
[facility] LCSW Group Therapy Note   Date/Time: 1pm Dec 3rd/17  Type of Therapy and Topic: Group Therapy: Communication   Participation Level:  Good  Description of Group: Communicating with others once Discharged  Patients identify how individuals communicate with one another appropriately and inappropriately. Patients will be guided to discuss their thoughts, feelings, and behaviors related to barriers when communicating. The group will process together ways to execute positive and appropriate communications, with attention given to how one uses behavior, tone, and body language. Patient will be encouraged to reflect on a situation where they were successfully able to communicate and the reasons that they believe helped them to communicate. Group will identify specific changes they are motivated to make in order to overcome communication barriers with self, peers, authority, and parents. This group will be process-oriented, with patients participating in exploration of their own experiences as well as giving and receiving support and challenging self as well as other group members.   Therapeutic Goals:  1. Patient will identify how people communicate (body language, facial expression, and electronics) Also discuss tone, voice and how these impact what is communicated and how the message is perceived.  2. Patient will identify feelings (such as fear or worry), thought process and behaviors related to why people internalize feelings rather than express self openly.  3. Patient will identify two changes they are willing to make to overcome communication barriers.  4. Members will then practice through Role Play how to communicate by utilizing psycho-education material (such as I Feel statements and acknowledging feelings rather than displacing on others)   Summary of Patient Progress: This patient was able to support and share her feelings about how she will communicate her needs and wants to her family  and how she will answer questions from  Friends and Family She was encouraged to reflect his feelings and set good privacy and self advocacy measures to ensure she is not feeling indifferent or treated differently by her peers.  Therapeutic Modalities:  Cognitive Behavioral Therapy  Solution Focused Therapy  Motivational Interviewing  Family Systems Approach   Melissa Horvath LCSW

## 2016-07-29 NOTE — Progress Notes (Signed)
D: Patient is alert and oriented on the unit this shift. Patient attended and actively participated in groups today. Patient denies suicidal ideation, homicidal ideation, auditory or visual hallucinations at the present time.  A: Scheduled medications are administered to patient as per MD orders. Emotional support and encouragement are provided. Patient is maintained on q.15 minute safety checks. Patient is informed to notify staff with questions or concerns. R: No adverse medication reactions are noted. Patient is cooperative with medication administration and treatment plan today. Patient is receptive, calm and cooperative on the unit at this time. Patient isolative on the unit this shift. Patient contracts for safety at this time. Patient remains safe at this time.

## 2016-07-29 NOTE — Progress Notes (Signed)
Patient pleasant and cooperative. Attended group with encouragement, visible in the dayroom coloring. Denies SI/AVH at this time and contracts for safety. Encouraged to verbalize feelings and thoughts to nursing staff. Patient verbalized understanding.

## 2016-07-29 NOTE — Plan of Care (Signed)
Problem: Coping: Goal: Ability to interact with others will improve Outcome: Not Progressing Patient seen in the dayroom for meals but remained in her room most of this shift.
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
## 2016-07-29 NOTE — Plan of Care (Signed)
Problem: Coping: Goal: Ability to cope will improve Outcome: Progressing Patient able to utilize coping skills at this time [place] RN   

## 2016-07-30 MED ORDER — TRAZODONE HCL 50 MG PO TABS: 50.0000 mg | ORAL_TABLET | Freq: Every evening | ORAL | Status: DC | PRN

## 2016-07-30 MED ORDER — ARIPIPRAZOLE 10 MG PO TABS
10.0000 mg | ORAL_TABLET | Freq: Every day | ORAL | Status: DC
  Filled 2016-07-30: qty 1
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
## 2016-07-30 NOTE — [facility] Group Notes (Signed)
[facility] LCSW Group Therapy   07/30/2016 1pm Type of Therapy: Group Therapy   Participation Level: Pt invited but did not attend.  Participation Quality: Pt invited but did not attend.  Hampton AbbotKadijah Juventino Pavone, MSW, LCSWA 07/30/2016, 2:06PM

## 2016-07-30 NOTE — Progress Notes (Signed)
Patient has been in room all day except for meals. Did not attend any groups. Had refused morning medication. Will try after dinner. Denies SI/HI/AVH. Just has been isolative to her room and not interacting with any one. Support offered.Remains on Q15 minute checks for safety. Will continue to monitor.

## 2016-07-30 NOTE — Progress Notes (Signed)
Pt denies SI/HI/AVH. Attended evening group. Affect brightens during interaction. Visible in milieu with appropriate behaviors among staff and peers.  Medication compliant. Encouragement and support provided. Denies pain and voices no additional concerns at this time. Safety maintained. Will continue to monitor.

## 2016-07-30 NOTE — Plan of Care (Signed)
Problem: Nutrition: Goal: Adequate nutrition will be maintained Outcome: Progressing Patient has maintained adequate nutrition   

## 2016-07-30 NOTE — Progress Notes (Signed)
D: Patient appears flat. Denies any SI currently. Also denies HI/AVH. Denies pain. She has been more visible in the milieu. Went to group and snack. Patient refused HS medication and states she has no reason to refuse she just doesn't want to take it.  A: No HS medications given. Encouragement provided. R: Patient was not compliant with medication. She has remained calm and cooperative. Safety maintained with 15 min checks.

## 2016-07-30 NOTE — Progress Notes (Signed)
Patient still refused daily dosage of Zyprexa.

## 2016-07-30 NOTE — Progress Notes (Signed)
Recreation Therapy Notes  Date: 12.04.17 Time: 9:30 am Location: Craft Room  Group Topic: Self-expression  Goal Area(s) Addresses:  Patient will identify one color per emotion listed on wheel. Patient will verbalize benefit of using art as a means of self-expression. Patient will verbalize one emotion experienced during session. Patient will be educated on other forms of self-expression.  Behavioral Response: Did not attend  Intervention: Emotion Wheel  Activity: Patients were given an Emotion Wheel worksheet and were instructed to pick a color for each emotion listed on the wheel.  Education: LRT educated patients on other forms of self-expression.  Education Outcome: Patient did not attend group.  Clinical Observations/Feedback: Patient did not attend group.  Jacquelynn CreeGreene,Juelz Claar M, LRT/CTRS 07/30/2016 10:08 AM

## 2016-07-31 MED ORDER — HYDROXYZINE HCL 50 MG PO TABS
50.0000 mg | ORAL_TABLET | Freq: Four times a day (QID) | ORAL | Status: DC | PRN
  Administered 2016-08-01 – 2016-08-04 (×2): 50 mg via ORAL
  Filled 2016-07-31 (×3): qty 1

## 2016-07-31 MED ORDER — ARIPIPRAZOLE 10 MG PO TABS: 5.0000 mg | ORAL_TABLET | Freq: Once | ORAL | Status: DC

## 2016-07-31 MED ORDER — ARIPIPRAZOLE 10 MG PO TABS
20.0000 mg | ORAL_TABLET | Freq: Every day | ORAL | Status: DC
  Administered 2016-08-01: 20 mg via ORAL
  Filled 2016-07-31: qty 2

## 2016-07-31 MED ORDER — ZOLPIDEM TARTRATE 5 MG PO TABS
5.0000 mg | ORAL_TABLET | Freq: Every evening | ORAL | Status: DC | PRN
  Administered 2016-08-02 – 2016-08-03 (×2): 5 mg via ORAL
  Filled 2016-07-31 (×2): qty 1

## 2016-07-31 NOTE — Progress Notes (Signed)
Patient still remains in her room majority of the day. Did not want to take medication even though dosage was decreased. When called to the medication room, quickly verbalized that her doctor told her she could take it tomorrow. Did not attend any groups. Only comes out for meals. Remains on Q15 minute checks for safety. Will continue to monitor.

## 2016-07-31 NOTE — Plan of Care (Signed)
Problem: Medication: Goal: Compliance with prescribed medication regimen will improve Outcome: Not Progressing Patient has not been taking prescribed medication even after being educated on it.

## 2016-07-31 NOTE — [facility] Group Notes (Signed)
[facility] LCSW Group Therapy   07/31/2016 2pm   Type of Therapy: Group Therapy   Participation Level: Pt invited but did not attend.  Participation Quality: Pt invited but did not attend.    Hampton AbbotKadijah Zareya Tuckett, MSW, LCSWA 07/31/2016, 3:07PM

## 2016-07-31 NOTE — Progress Notes (Signed)
Recreation Therapy Notes  Date: 12.05.17 Time: 9:30 am Location: Craft Room  Group Topic: Goal Setting  Goal Area(s) Addresses:  Patient will write at least one goal. Patient will write at least one obstacle.  Behavioral Response: Arrived late, Intermittently Attentive, Intermittently Disruptive  Intervention: Recovery Goal Chart  Activity: Patients were instructed to make a recovery goal chart including goals, obstacles, the date they started working on their goals, and the date they achieved them.  Education: LRT educated patients on ways to celebrate reaching their goals in healthy ways.  Education Outcome: In group clarification offered  Clinical Observations/Feedback: Patient arrived to group at approximately 9:45 am. Patient copied the chart, but did not write goals or obstacles. When LRT asked patient about her goals, patient stated she did not have any. Patient laughed inappropriately and would close her eyes or look out the window.  Melissa [name],Melissa [name], LRT/CTRS 07/31/2016 10:19 AM

## 2016-07-31 NOTE — Progress Notes (Signed)
A loud scream was heard coming from patient's room. Patient appeared to be having a panic attack. Nurse and Engineer, materialssecurity officer stayed with patient. This Clinical research associatewriter contacted MD on call. Vistaril PRN was ordered but patient refused. Patient calmed down after approximately 30 mins. Refused all HS medication.
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
## 2016-08-01 MED ORDER — OLANZAPINE 10 MG IM SOLR
5.0000 mg | Freq: Every day | INTRAMUSCULAR | Status: DC
  Filled 2016-08-01: qty 10

## 2016-08-01 MED ORDER — ARIPIPRAZOLE 10 MG PO TABS
20.0000 mg | ORAL_TABLET | Freq: Every day | ORAL | Status: DC
  Administered 2016-08-02 – 2016-08-05 (×4): 20 mg via ORAL
  Filled 2016-08-01 (×5): qty 2
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
## 2016-08-01 NOTE — [facility] Group Notes (Signed)
[facility] Group Notes:  (Nursing/MHT/Case Management/Adjunct)  Date:  08/01/2016  Time:  4:08 PM  Type of Therapy:  Psychoeducational Skills  Participation Level:  Did Not Attend  Melissa [name] 08/01/2016, 4:08 PM 

## 2016-08-01 NOTE — Progress Notes (Signed)
[facility] Phys[facility] Opinion Progress Note for Medication Administration to Non-consenting Patients (For Involuntarily Committed Patients)  Patient: Melissa [name] Date of Birth: [id number]-May-1997 MRN: [id number]  Reason for the Medication: The patient, without the benefit of the specific treatment measure, is incapable of participating in any available treatment plan that will give the patient a realistic opportunity of improving the patient's condition. There is, without the benefit of the specific treatment measure, a significant possibility that the patient will harm self or others before improvement of the patient's condition is realized.  Consideration of Side Effects: Consideration of the side effects related to the medication plan has been given.  Rationale for Medication Administration: psychotic patient refusing necessary medications.    Melissa LineaJolanta Taheerah Guldin, MD 08/01/16  11:08 AM   This documentation is good for (7) seven days from the date of the MD signature. New documentation must be completed every seven (7) days with detailed justification in the medical record if the patient requires continued non-emergent administration of psychotropic medications.

## 2016-08-01 NOTE — Plan of Care (Signed)
Problem: Medication: Goal: Compliance with prescribed medication regimen will improve Outcome: Not Progressing Patient has been noncompliant with medication regimen

## 2016-08-01 NOTE — Progress Notes (Signed)
Patient has been in room all day except meals. Did not attend groups today. Did take morning medication after second attempt. Denies SI/HI or AVH. Was seen in day room this morning for breakfast and was dancing. Remains on Q15 minute checks for safety. Will continue to monitor.

## 2016-08-01 NOTE — Progress Notes (Signed)
Recreation Therapy Notes  Date: 12.06.17 Time: 9:30 am Location: Craft Room  Group Topic: Self-esteem  Goal Area(s) Addresses:  Patient will identify at least one positive trait about self. Patient will identify at least one healthy coping skill.  Behavioral Response: Did not attend  Intervention: All About Me  Activity: Patients were instructed to make an All About Me pamphlet including their life's motto, positive traits, healthy coping skills, and their support system.   Education: LRT educated patients on ways they can increase their self-esteem.  Education Outcome: Patient did not attend group.  Clinical Observations/Feedback: Patient did not attend group.  Jacquelynn CreeGreene,Wynell Halberg M, LRT/CTRS 08/01/2016 10:04 AM

## 2016-08-02 NOTE — Progress Notes (Signed)
Pt awake, alert, oriented up on unit today. Comes out of room for meals/meds only. Did not attend groups. Pt moved to another room today due to pt acuity, cooperative with move. Appears calm, pleasant, but guarded. Morning nurse reported pt did take medications as ordered, see MAR, but required some encouragement. Pt has forced med orders if pt does not take PO medications as ordered.  Denies SI/HI/AVH.   Support and encouragement provided with use of therapeutic communication.Medications administered as ordered with education. Safety maintained with every 15 minute checks. Will continue to monitor.

## 2016-08-02 NOTE — Plan of Care (Signed)
Problem: Coping: Goal: Ability to cope will improve Outcome: Progressing Patient has coping skills as she stated when questioned this shift Tax adviserCTownsend RN

## 2016-08-02 NOTE — Progress Notes (Signed)
Recreation Therapy Notes  Date: 12.07.17 Time: 9:30 am Location: Craft Room  Group Topic: Leisure Education  Goal Area(s) Addresses:  Patient will identify things they are grateful for. Patient will be educated on how being grateful can influence decision making.  Behavioral Response: Did not attend  Intervention: Grateful Wheel  Activity: Patients were given a I Am Grateful For worksheet and were instructed to write things they are grateful for under each category.  Education: LRT educated patients on how being grateful affects their decision making.  Education Outcome: Patient did not attend group.   Clinical Observations/Feedback: Patient did not attend group.  Jacquelynn CreeGreene,Ova Meegan M, LRT/CTRS 08/02/2016 10:03 AM

## 2016-08-02 NOTE — [facility] Group Notes (Signed)
[facility] LCSW Group Therapy   08/02/2016 1pm   Type of Therapy: Group Therapy   Participation Level: Pt invited but did not attend.  Participation Quality: Pt invited but did not attend.   Hampton AbbotKadijah Meka Lewan, MSW, LCSWA 08/02/2016, 2:10PM
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
## 2016-08-02 NOTE — Plan of Care (Signed)
Problem: Activity: Goal: Will identify at least one activity in which they can participate Outcome: Not Progressing Pt does not attend groups. Isolative to room except during meals/med times.

## 2016-08-02 NOTE — Progress Notes (Signed)
2000: Patient in room, sitting in chair. Alert and oriented, pleasant and cooperative. Denies SI/HI. Denies hallucinations. No visible sign of discomfort. Was encouraged to express her thoughts and feelings as needed. Was encouraged to participate in unit activities. Therapeutic milieu promoted and safety maintained.

## 2016-08-02 NOTE — [facility] Group Notes (Signed)
[facility] Group Notes:  (Nursing/MHT/Case Management/Adjunct)  Date:  08/02/2016  Time:  11:17 PM  Type of Therapy:  Psychoeducational Skills  Participation Level:  Did Not Attend  Participation Quality Mayra NeerJackie L Limmie Schoenberg 08/02/2016, 11:17 PM

## 2016-08-02 NOTE — Progress Notes (Signed)
D: Patient is alert and oriented on the unit this shift. Patient not attended and actively participated in groups today. Patient denies suicidal ideation, homicidal ideation, auditory or visual hallucinations at the present time.  A: Scheduled medications are administered to patient as per MD orders. Emotional support and encouragement are provided. Patient is maintained on q.15 minute safety checks. Patient is informed to notify staff with questions or concerns. R: No adverse medication reactions are noted. Patient is cooperative with medication administration  . Patient is receptive, calm and cooperative on the unit at this time. Patient  isolative on the unit this shift. Patient contracts for safety at this time. Patient remains safe at this time. Anxiety 2/10 Depression 2/10

## 2016-08-03 MED ORDER — ARIPIPRAZOLE ER 400 MG IM SRER
400.0000 mg | INTRAMUSCULAR | Status: DC
  Administered 2016-08-03: 400 mg via INTRAMUSCULAR
  Filled 2016-08-03: qty 400
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
## 2016-08-03 NOTE — [facility] Group Notes (Signed)
[facility] Group Notes:  (Nursing/MHT/Case Management/Adjunct)  Date:  08/03/2016  Time:  5:11 PM  Type of Therapy:  Group Therapy  Participation Level:  Did Not Attend   Othella Slappey De'Chelle Corona Popovich 08/03/2016, 5:11 PM

## 2016-08-03 NOTE — [facility] Group Notes (Signed)
[facility] LCSW Group Therapy   08/03/2016 1 PM   Type of Therapy: Group Therapy   Participation Level: Pt invited but did not attend.   Participation Quality: Pt invited but did not attend.   Hampton AbbotKadijah Drako Maese, MSW, LCSWA 08/03/2016, 1:47PM

## 2016-08-03 NOTE — Progress Notes (Signed)
Patient slept all night. Had no concern. 

## 2016-08-03 NOTE — Plan of Care (Signed)
Problem: Coping: Goal: Ability to cope will improve Outcome: Progressing Patient able to verbalize her feelings and concerns, to express her needs

## 2016-08-03 NOTE — Tx Team (Signed)
Interdisciplinary Treatment and Diagnostic Plan Update  08/03/2016 Time of Session: 11:00 AM Melissa [name] MRN: [id number]  Principal Diagnosis: Schizoaffective disorder, depressive type (HCC)  Secondary Diagnoses: Principal Problem:   Schizoaffective disorder, depressive type (HCC)   Current Medications:  Current Facility-Administered Medications  Medication Dose Route Frequency Provider Last Rate Last Dose  . acetaminophen (TYLENOL) tablet 650 mg  650 mg Oral Q6H PRN Brandy HaleUzma Faheem, MD      . alum & mag hydroxide-simeth (MAALOX/MYLANTA) 200-200-20 MG/5ML suspension 30 mL  30 mL Oral Q4H PRN Brandy HaleUzma Faheem, MD      . OLANZapine (ZYPREXA) injection 5 mg  5 mg Intramuscular Daily Jimmy FootmanAndrea Hernandez-Gonzalez, MD       Or  . ARIPiprazole (ABILIFY) tablet 20 mg  20 mg Oral Daily Jimmy FootmanAndrea Hernandez-Gonzalez, MD   20 mg at 08/03/16 0904  . ARIPiprazole ER SRER 400 mg  400 mg Intramuscular Q30 days Jimmy FootmanAndrea Hernandez-Gonzalez, MD      . hydrOXYzine (ATARAX/VISTARIL) tablet 50 mg  50 mg Oral Q6H PRN Audery AmelJohn T Clapacs, MD   50 mg at 08/01/16 2141  . magnesium hydroxide (MILK OF MAGNESIA) suspension 30 mL  30 mL Oral Daily PRN Brandy HaleUzma Faheem, MD      . sertraline (ZOLOFT) tablet 25 mg  25 mg Oral QHS Jolanta B Pucilowska, MD   25 mg at 08/02/16 2132  . zolpidem (AMBIEN) tablet 5 mg  5 mg Oral QHS PRN Jimmy FootmanAndrea Hernandez-Gonzalez, MD   5 mg at 08/02/16 2135   PTA Medications: No prescriptions prior to admission.    Patient Stressors: Paediatric nurseinancial difficulties Legal issue Marital or family conflict Occupational concerns  Patient Strengths: Capable of independent living Wellsite geologistCommunication skills General fund of knowledge Motivation for treatment/growth Work skills  Treatment Modalities: Medication Management, Group therapy, Case management,  1 to 1 session with clinician, Psychoeducation, Recreational therapy.   Physician Treatment Plan for Primary Diagnosis: Schizoaffective disorder, depressive type  (HCC) Long Term Goal(s): Improvement in symptoms so as ready for discharge Improvement in symptoms so as ready for discharge   Short Term Goals: Ability to identify changes in lifestyle to reduce recurrence of condition will improve Ability to identify changes in lifestyle to reduce recurrence of condition will improve  Medication Management: Evaluate patient's response, side effects, and tolerance of medication regimen.  Therapeutic Interventions: 1 to 1 sessions, Unit Group sessions and Medication administration.  Evaluation of Outcomes: Progressing  Physician Treatment Plan for Secondary Diagnosis: Principal Problem:   Schizoaffective disorder, depressive type (HCC)  Long Term Goal(s): Improvement in symptoms so as ready for discharge Improvement in symptoms so as ready for discharge   Short Term Goals: Ability to identify changes in lifestyle to reduce recurrence of condition will improve Ability to identify changes in lifestyle to reduce recurrence of condition will improve     Medication Management: Evaluate patient's response, side effects, and tolerance of medication regimen.  Therapeutic Interventions: 1 to 1 sessions, Unit Group sessions and Medication administration.  Evaluation of Outcomes: Progressing   RN Treatment Plan for Primary Diagnosis: Schizoaffective disorder, depressive type (HCC) Long Term Goal(s): Knowledge of disease and therapeutic regimen to maintain health will improve  Short Term Goals: Ability to remain free from injury will improve, Ability to demonstrate self-control, Ability to verbalize feelings will improve, Ability to identify and develop effective coping behaviors will improve and Compliance with prescribed medications will improve  Medication Management: RN will administer medications as ordered by provider, will assess and evaluate patient's response and provide  education to patient for prescribed medication. RN will report any adverse and/or  side effects to prescribing provider.  Therapeutic Interventions: 1 on 1 counseling sessions, Psychoeducation, Medication administration, Evaluate responses to treatment, Monitor vital signs and CBGs as ordered, Perform/monitor CIWA, COWS, AIMS and Fall Risk screenings as ordered, Perform wound care treatments as ordered.  Evaluation of Outcomes: Progressing   LCSW Treatment Plan for Primary Diagnosis: Schizoaffective disorder, depressive type (HCC) Long Term Goal(s): Safe transition to appropriate next level of care at discharge, Engage patient in therapeutic group addressing interpersonal concerns.  Short Term Goals: Engage patient in aftercare planning with referrals and resources, Increase social support, Facilitate acceptance of mental health diagnosis and concerns and Increase skills for wellness and recovery  Therapeutic Interventions: Assess for all discharge needs, 1 to 1 time with Social worker, Explore available resources and support systems, Assess for adequacy in community support network, Educate family and significant other(s) on suicide prevention, Complete Psychosocial Assessment, Interpersonal group therapy.  Evaluation of Outcomes: Progressing   Progress in Treatment: Attending groups: Yes. Participating in groups: Yes. Taking medication as prescribed: Yes. Toleration medication: Yes. Family/Significant other contact made: Yes, individual(s) contacted:  father. Patient understands diagnosis: Yes. Discussing patient identified problems/goals with staff: Yes. Medical problems stabilized or resolved: Yes. Denies suicidal/homicidal ideation: Yes. Issues/concerns per patient self-inventory: Yes.  New problem(s) identified: No, Describe:  None identified.  New Short Term/Long Term Goal(s):  Discharge Plan or Barriers: CSW still assessing for appropriate referrals  Reason for Continuation of Hospitalization: Anxiety Depression Suicidal ideation  Estimated Length of  Stay: 7 days   Attendees: Patient:  08/03/2016 2:36 PM  Physician: Dr. Radene JourneyAndrea Hernandez, MD  08/03/2016 2:36 PM  Nursing: Leonia ReaderPhyllis Cobb, BSN, RN 08/03/2016 2:36 PM  RN Care Manager:  08/03/2016 2:36 PM  Social Worker: Dorothe PeaJonathan F. Durward ParcelRiffey, LCSWA, LCAS  08/03/2016 2:36 PM  Recreational Therapist: Hershal CoriaBeth Greene, LRT, CTRS  08/03/2016 2:36 PM    Scribe for Treatment Team: Mercy RidingJonathan F Catrinia Racicot, LCSWA 08/03/2016 2:36 PM

## 2016-08-03 NOTE — Progress Notes (Signed)
Pt awake, alert, up on unit today. Does not attend groups. Stays in room except during meals/med times. Pt is pleasant and cooperative otherwise. Denies SI/HI/AVH. Reports good sleep last night with help of sleep medications, good appetite, normal energy, good concentration. Rates depression 0/10, anxiety 0/10, hopelessness 0/10 (low 0-10 high). Reports goal today is "go home." compliant with medications. IM Abilify ordered to be given this afternoon.   Support and encouragement provided with use of therapeutic communication. Medications administered as ordered with education. Safety maintained with every 15 minute checks. Will continue to monitor.

## 2016-08-03 NOTE — Progress Notes (Signed)
2300: Patient stayed in room and came to the day room for snack. Calm and cooperative, patient presented to the medication room. Requested Ambien in addition to her scheduled medication. Had no concern and maintained a positive attitude, denying SI/HI. Currently in bed sleeping. Safety precautions maintained.

## 2016-08-03 NOTE — Plan of Care (Signed)
Problem: Medication: Goal: Compliance with prescribed medication regimen will improve Outcome: Progressing Compliant with medications as ordered at this time.

## 2016-08-03 NOTE — Plan of Care (Signed)
Problem: Activity: Goal: Risk for activity intolerance will decrease Outcome: Progressing Patient is in and out of her room. No sign of discomfort

## 2016-08-03 NOTE — Progress Notes (Signed)
Recreation Therapy Notes  Date: 12.08.17 Time: 9:30 am Location: Craft Room  Group Topic: Coping Skills  Goal Area(s) Addresses:  Patient will participate in healthy coping skill. Patient will verbalize benefit of using art as a coping skill.  Behavioral Response: Did not attend  Intervention: Coloring  Activity: Patients were given coloring sheets to color and were instructed to think about what emotions they were feeling and what their minds were focused on.  Education: LRT educated patients on healthy coping skills.  Education Outcome: Patient did not attend group.  Clinical Observations/Feedback: Patient did not attend group.  Lesbia Ottaway M, LRT/CTRS 08/03/2016 10:13 AM 

## 2016-08-04 NOTE — Plan of Care (Signed)
Problem: Coping: Goal: Ability to cope will improve Patient's coping skills is improving.
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
## 2016-08-04 NOTE — Progress Notes (Signed)
Patient with blunted affect, cooperative with meals, meds and plan of care. No SI/HI/AVH at this time. Minimal interaction with peers, isolates in room. Cooperative with am meds and knowledgeable about name of med. Guarded, appropriate with staff initiates interaction. MD into visit. Safety maintained.

## 2016-08-04 NOTE — Plan of Care (Signed)
Problem: Coping: Goal: Ability to cope will improve Outcome: Not Progressing Pt not able to cope using coping mechanisms at this time ,pt isolative to room and to others [employer] RN

## 2016-08-04 NOTE — [facility] Group Notes (Signed)
[facility] LCSW Group Therapy  08/04/2016 2:33 PM  Type of Therapy:  Group Therapy  Participation Level:  Patient did not attend group. CSW invited patient to group.   Summary of Progress/Problems: Stress management: Patients defined and discussed the topic of stress and the related symptoms and triggers for stress. Patients identified healthy coping skills they would like to try during hospitalization and after discharge to manage stress in a healthy way. CSW offered insight to varying stress management techniques.   Eduardo Wurth G. Garnette CzechSampson MSW, LCSWA 08/04/2016, 2:34 PM

## 2016-08-04 NOTE — Progress Notes (Signed)
D: Pt denies SI/HI/AVH. Pt is pleasant and cooperative, affect is flat, but brightens upon approach. Pt appears less anxious and se is interacting with peers and staff appropriately.  A: Pt was offered support and encouragement. Pt was given scheduled medications. Pt was encouraged to attend groups. Q 15 minute checks were done for safety.  R:Pt did not attend group. Pt is taking medication. Pt has no complaints.Pt receptive to treatment and safety maintained on unit.

## 2016-08-04 NOTE — Progress Notes (Signed)
D: Patient is alert and oriented on the unit this shift. Patient not  attended and   participated in groups today. Patient denies suicidal ideation, homicidal ideation, auditory or visual hallucinations at the present time.  A: Scheduled medications are administered to patient as per MD orders. Emotional support and encouragement are provided. Patient is maintained on q.15 minute safety checks. Patient is informed to notify staff with questions or concerns. R: No adverse medication reactions are noted. Patient is cooperative with medication administration   Patient is receptive,  Guarded, isolative and cooperative on the unit at this time. Patient does not  Interact  with others on the unit this shift. Patient contracts for safety at this time. Patient remains safe at this time. Anxiety 2/10 Depression 2/10

## 2016-08-05 LAB — PREGNANCY, URINE: Preg Test, Ur: NEGATIVE

## 2016-08-05 MED ORDER — OLANZAPINE 10 MG IM SOLR: 5.0000 mg | Freq: Every day | INTRAMUSCULAR | Status: DC

## 2016-08-05 MED ORDER — ARIPIPRAZOLE 10 MG PO TABS
30.0000 mg | ORAL_TABLET | Freq: Every day | ORAL | Status: DC
  Administered 2016-08-06 – 2016-08-07 (×2): 30 mg via ORAL
  Filled 2016-08-05 (×2): qty 3

## 2016-08-05 MED ORDER — DIPHENHYDRAMINE HCL 25 MG PO CAPS
25.0000 mg | ORAL_CAPSULE | Freq: Every day | ORAL | Status: DC
  Administered 2016-08-05 – 2016-08-06 (×2): 25 mg via ORAL
  Filled 2016-08-05 (×2): qty 1

## 2016-08-05 NOTE — Progress Notes (Signed)
Patient with appropriate affect, cooperative behavior with meals, meds and plan of care. No SI/HI/AVH at this time. Quiet with peers. Appropriate when staff initiates interaction. Urine sample obtained with results pending. Safety maintained.
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
## 2016-08-05 NOTE — [facility] Group Notes (Signed)
[facility] LCSW Group Therapy  08/05/2016 2:26 PM  Type of Therapy:  Group Therapy  Participation Level:  Patient did not attend group. CSW invited patient to group.   Summary of Progress/Problems: Coping Skills: Patients defined and discussed healthy coping skills. Patients identified healthy coping skills they would like to try during hospitalization and after discharge. CSW offered insight to varying coping skills that may have been new to patients such as practicing mindfulness.  Bennette Hasty G. Garnette CzechSampson MSW, LCSWA 08/05/2016, 2:26 PM

## 2016-08-06 MED ORDER — ARIPIPRAZOLE 30 MG PO TABS: 30.0000 mg | ORAL_TABLET | Freq: Every day | ORAL | 1 refills | Status: DC

## 2016-08-06 MED ORDER — SERTRALINE HCL 50 MG PO TABS
50.0000 mg | ORAL_TABLET | Freq: Every day | ORAL | Status: DC
  Administered 2016-08-06: 50 mg via ORAL
  Filled 2016-08-06: qty 1

## 2016-08-06 MED ORDER — DIPHENHYDRAMINE HCL 25 MG PO CAPS: 25.0000 mg | ORAL_CAPSULE | Freq: Every day | ORAL | 1 refills | Status: DC

## 2016-08-06 MED ORDER — ARIPIPRAZOLE ER 400 MG IM SRER: 400.0000 mg | INTRAMUSCULAR | 1 refills | Status: DC

## 2016-08-06 MED ORDER — SERTRALINE HCL 50 MG PO TABS: 50.0000 mg | ORAL_TABLET | Freq: Every day | ORAL | 1 refills | Status: DC

## 2016-08-06 MED ORDER — HYDROXYZINE HCL 50 MG PO TABS: 50.0000 mg | ORAL_TABLET | Freq: Four times a day (QID) | ORAL | 1 refills | Status: DC | PRN

## 2016-08-06 NOTE — Progress Notes (Signed)
Recreation Therapy Notes  Date: 12.11.17 Time: 9:30 am Location: Craft Room  Group Topic: Self-expression  Goal Area(s) Addresses:  Patient will effectively use art as a means of self-expression. Patient will recognize positive benefit of self-expression. Patient will be able to identify one emotion experienced during group session. Patient will identify use of art as a coping skill.  Behavioral Response: Did not attend  Intervention: Two Faces of Me  Activity: Patients were given a blank face worksheet and were instructed to draw a line down the middle. On one side, they were instructed to draw or write how they felt when they were admitted to the hospital. On the other side, they were instructed to draw or write how they want to feel when they are d/c.  Education: LRT educated patients on other forms of self-expression.  Education Outcome: Patient did not attend group.  Clinical Observations/Feedback: Patient did not attend group.  Jacquelynn CreeGreene,Omnia Dollinger M, LRT/CTRS 08/06/2016 10:07 AM

## 2016-08-06 NOTE — Progress Notes (Signed)
Patient presents with sad affect but brightens on approach. Denies SI, HI, AVH. Pt reports ready to go home. Pt isolates to self and room, did not attend group, minimal interaction with staff and peers.  Encouragement and support offered. Medications given as prescribed. Pt receptive and remains safe on unit with q 15 min checks.

## 2016-08-06 NOTE — Progress Notes (Signed)
  [facility] Discharge Plan :  Will you be returning to the same living situation after discharge:  No. [facility]   At discharge, do you have transportation home?: Yes,  pt [facility] Do you have the ability to pay for your medications: Yes,  pt will be provided with prescriptions at discharge  Release of information consent forms completed and in the chart;  Patient's signature needed at discharge.  Patient to Follow up at: Follow-up Information    [facility] Follow up.   Why:      Your ACTT Team will visit you in your home between 2-5pm the day after discharge to complete your hospital follow up assessment for medication managment, substance abuse treatment and therapy. Please call [phone] if you experience a crisis. Contact information: [facility] [address] [place], [place] [address] Ph: [phone] Crisis Line: [phone]  Fax: [phone]       [facility] Follow up.   Why:  Please arrive between the hours of 11am-1pm the on day of discharge to be admitted for transition to independent living and residential care.   Contact information: [facility] [address] [address] [place], [place] [address] Ph: [phone] Fax: [phone]          Next level of care provider has access to [facility] Link:no  Safety Planning and Suicide Prevention discussed: Yes,  completed with pt  Have you used any form of tobacco in the last 30 days? (Cigarettes, Smokeless Tobacco, Cigars, and/or Pipes): No  Has patient been referred to the Quitline?: N/A patient is not a smoker  Patient has been referred for addiction treatment: N/A  Mercy RidingJonathan F Zubayr Bednarczyk 08/06/2016, 4:52 PM

## 2016-08-06 NOTE — [facility] Group Notes (Signed)
[facility] LCSW Group Therapy   08/06/2016  1 PM  Type of Therapy: Group Therapy   Participation Level: Did Not Attend. Patient invited to participate but declined.    Rennae Ferraiolo F. Kashonda Sarkisyan, MSW, LCSWA, LCAS     

## 2016-08-06 NOTE — Progress Notes (Signed)
Pt denies SI/HI/AVH. Brightens during interaction. Does not initiate any interaction. Visible in milieu but mostly keeps to self. Appropriate behaviors with staff and peers. Denies any pain. Cooperative with plan of care. Medication compliant. Voices no additional concerns at this time. Safety maintained. Will continue to monitor.
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
## 2016-08-07 NOTE — Discharge Summary (Deleted)
Physician Discharge Summary Note  Patient:  Melissa [name] is an 20 y.o., female MRN:  [id number] DOB:  02-10-1996 Patient phone:  [phone] (home)  Patient address:   [address] [place] [address],  Total Time spent with patient: 30 minutes  Date of Admission:  07/19/2016 Date of Discharge: 08/07/2016  Reason for Admission:  Psychotic break.  Patient is a 20 year old female with history of schizoaffective disorder who was admitted after she tried to hurt herself. She was trying to hang himself in the basement of her apartment complex and was found by her landlord. Patient reported that she lives on the top floor. She reported that she has been becoming more depressed progressively. She reported that she decided to hurt herself and went to the basement and everything was already fair. She reported it was really hard to figure out her self. She stated that she currently lives by herself. Patient is unable to contract for safety at this time. Associated Signs/Symptoms: Depression Symptoms:  depressed mood, anhedonia, psychomotor retardation, fatigue, hopelessness, suicidal thoughts with specific plan, suicidal attempt, anxiety, (Hypo) Manic Symptoms:  Impulsivity, Irritable Mood, Anxiety Symptoms:  Excessive Worry, Psychotic Symptoms:  Paranoia, PTSD Symptoms: Had a traumatic exposure:  past  Past Psychiatric History: [facility] and [facility] for out patient treatment. [facility]. She has not been compliant with treatment.  Substance Abuse History in the last 12 months:  No.  Family Psychiatric  History: None reported.  Social History: The patient has been staying at the boarding house. She has supportive father, who [place], and mother who lives in [place]. She refuses to live with her father. She has Media plannerprivate insurance.  Principal Problem: Schizoaffective disorder, depressive type  [facility] Discharge Diagnoses: Patient Active Problem List   Diagnosis Date Noted  . Schizoaffective disorder, depressive type (HCC) [F25.1] 07/19/2016   Past Medical History:  Past Medical History:  Diagnosis Date  . Asthma   . Depression    History reviewed. No pertinent surgical history. Family History: History reviewed. No pertinent family history.  Social History:  History  Alcohol Use No     History  Drug Use No    Social History   Social History  . Marital status: Single    Spouse name: N/A  . Number of children: N/A  . Years of education: N/A   Social History Main Topics  . Smoking status: Never Smoker  . Smokeless tobacco: Never Used  . Alcohol use No  . Drug use: No  . Sexual activity: Not Asked   Other Topics Concern  . None   Social History Narrative  . None    Hospital Course:    [name] Is a 20 year old single female with history of schizoaffective disorder who was admitted after she tried to hang herself in the basement of her apartment complex. She was admitted to inpatient psychiatry for medication management, safety and stabilization.  1. Suicidal ideation. Resolved. The patient is able to contract for safety. She is forward thinking and more optimistic about the future.   2.  Schizoaffective disorder, bipolar type. The patient has been noncompliant with medications in the community. She initially refused medications in the hospital and was placed on forced medications order. She was oversedated with Zyprexa but eventually agreed to take Abilify and accepted Abilify Maintena injection on 08/03/2016 for psychosis. She was started on Zoloft for depression.  3. Insomnia. She responded  well to Benadryl 25 mg.   4. Metabolic syndrome monitoring. Lipid panel, TSH and HgbA1C were normal.   5. Disposition. The patient will be discharged to transitional housing. She refused to return to her father's house. She will follow up with [facility] team.   Physical Findings: AIMS: Facial and Oral Movements Muscles of Facial Expression: None, normal Lips and Perioral Area: None, normal Jaw: None, normal Tongue: None, normal,Extremity Movements Upper (arms, wrists, hands, fingers): None, normal Lower (legs, knees, ankles, toes): None, normal, Trunk Movements Neck, shoulders, hips: None, normal, Overall Severity Severity of abnormal movements (highest score from questions above): None, normal Incapacitation due to abnormal movements: None, normal Patient's awareness of abnormal movements (rate only patient's report): No Awareness, Dental Status Current problems with teeth and/or dentures?: No Does patient usually wear dentures?: No  CIWA:    COWS:     Musculoskeletal: Strength & Muscle Tone: within normal limits Gait & Station: normal Patient leans: N/A   Psychiatric Specialty Exam: Physical Exam  Nursing note and vitals reviewed.   Review of Systems  Psychiatric/Behavioral: Positive for depression.  All other systems reviewed and are negative.   Blood pressure 108/73, pulse 82, temperature 98.3 F (36.8 C), resp. rate 20, height 5\' 7"  (1.702 m), weight 59 kg (130 lb), last menstrual period 06/27/2016, SpO2 100 %.Body mass index is 20.36 kg/m.  General Appearance: Casual  Eye Contact:  Good  Speech:  Clear and Coherent  Volume:  Normal  Mood:  Anxious  Affect:  Appropriate  Thought Process:  Goal Directed and Descriptions of Associations: Intact  Orientation:  Full (Time, Place, and Person)  Thought Content:  WDL  Suicidal Thoughts:  No  Homicidal Thoughts:  No  Memory:  Immediate;   Fair Recent;   Fair Remote;   Fair  Judgement:  Impaired  Insight:  Shallow  Psychomotor Activity:  Normal  Concentration:  Concentration: Fair and Attention Span: Fair  Recall:  [employer] of Knowledge:  Fair  Language:  Fair  Akathisia:  No  Handed:  Right  AIMS (if indicated):     Assets:  Communication  Skills Desire for Improvement Financial Resources/Insurance Physical Health Resilience Social Support  ADL's:  Intact  Cognition:  WNL  Sleep:  Number of Hours: 7.15     Have you used any form of tobacco in the last 30 days? (Cigarettes, Smokeless Tobacco, Cigars, and/or Pipes): No  Has this patient used any form of tobacco in the last 30 days? (Cigarettes, Smokeless Tobacco, Cigars, and/or Pipes) Yes, No  Blood Alcohol level:  Lab Results  Component Value Date   [facility] <5 07/19/2016   ETH <5 05/03/2016    Metabolic Disorder Labs:  Lab Results  Component Value Date   HGBA1C 4.8 07/22/2016   MPG 91 07/22/2016   Lab Results  Component Value Date   PROLACTIN 6.6 07/22/2016   Lab Results  Component Value Date   CHOL 126 07/22/2016   TRIG 49 07/22/2016   HDL 47 07/22/2016   CHOLHDL 2.7 07/22/2016   VLDL 10 07/22/2016   LDLCALC 69 07/22/2016    See Psychiatric Specialty Exam and Suicide Risk Assessment completed by Attending Physician prior to discharge.  Discharge destination:  Other:  transitional house  Is patient on multiple antipsychotic therapies at discharge:  No   Has Patient had three or more failed trials of antipsychotic monotherapy by history:  No  Recommended Plan for Multiple Antipsychotic Therapies: NA  Discharge Instructions  Diet - low sodium heart healthy    Complete by:  As directed    Increase activity slowly    Complete by:  As directed        Medication List    TAKE these medications     Indication  ARIPiprazole 30 MG tablet Commonly known as:  ABILIFY Take 1 tablet (30 mg total) by mouth daily.  Indication:  Schizophrenia   ARIPiprazole ER 400 MG Srer Inject 400 mg into the muscle every 30 (thirty) days. Next injectio on 09/01/2015 Start taking on:  09/02/2016  Indication:  Schizophrenia   diphenhydrAMINE 25 mg capsule Commonly known as:  BENADRYL Take 1 capsule (25 mg total) by mouth at bedtime.  Indication:  Trouble Sleeping    hydrOXYzine 50 MG tablet Commonly known as:  ATARAX/VISTARIL Take 1 tablet (50 mg total) by mouth every 6 (six) hours as needed for anxiety.  Indication:  Anxiety Neurosis   sertraline 50 MG tablet Commonly known as:  ZOLOFT Take 1 tablet (50 mg total) by mouth at bedtime.  Indication:  Major Depressive Disorder      Follow-up Information    [facility] Team Follow up.   Why:      Your ACTT Team will visit you in your home between 2-5pm the day after discharge to complete your hospital follow up assessment for medication managment, substance abuse treatment and therapy. Please call [phone] if you experience a crisis. Contact information: [facility] [address] [place], [place] [address] Ph: [phone] Crisis Line: [phone]  Fax: [phone]       [facility] Follow up.   Why:  Please arrive between the hours of 11am-1pm the on day of discharge to be admitted for transition to independent living and residential care.   Contact information: [facility] [address] [address] [place], [place] [address] Ph: [phone] Fax: [phone]          Follow-up recommendations:  Activity:  as tolerated. Diet:  regular. Other:  keep follow up appointments.  Comments:    Signed: Kristine LineaJolanta Jasier Calabretta, MD 08/07/2016, 12:49 PM

## 2016-08-07 NOTE — Tx Team (Signed)
Interdisciplinary Treatment and Diagnostic Plan Update  08/07/2016 Time of Session: 11:00 AM Melissa [name] A Older MRN: [id number]  Principal Diagnosis: Schizoaffective disorder, depressive type (HCC)  Secondary Diagnoses: Principal Problem:   Schizoaffective disorder, depressive type (HCC)   Current Medications:  Current Facility-Administered Medications  Medication Dose Route Frequency Provider Last Rate Last Dose  . acetaminophen (TYLENOL) tablet 650 mg  650 mg Oral Q6H PRN Brandy HaleUzma Faheem, MD      . alum & mag hydroxide-simeth (MAALOX/MYLANTA) 200-200-20 MG/5ML suspension 30 mL  30 mL Oral Q4H PRN Brandy HaleUzma Faheem, MD      . ARIPiprazole (ABILIFY) tablet 30 mg  30 mg Oral Daily Jimmy FootmanAndrea Hernandez-Gonzalez, MD   30 mg at 08/07/16 0815  . ARIPiprazole ER SRER 400 mg  400 mg Intramuscular Q30 days Jimmy FootmanAndrea Hernandez-Gonzalez, MD   400 mg at 08/03/16 1446  . diphenhydrAMINE (BENADRYL) capsule 25 mg  25 mg Oral QHS Jimmy FootmanAndrea Hernandez-Gonzalez, MD   25 mg at 08/06/16 2119  . hydrOXYzine (ATARAX/VISTARIL) tablet 50 mg  50 mg Oral Q6H PRN Audery AmelJohn T Clapacs, MD   50 mg at 08/04/16 2131  . magnesium hydroxide (MILK OF MAGNESIA) suspension 30 mL  30 mL Oral Daily PRN Brandy HaleUzma Faheem, MD      . sertraline (ZOLOFT) tablet 50 mg  50 mg Oral QHS Shari ProwsJolanta B Pucilowska, MD   50 mg at 08/06/16 2119   PTA Medications: No prescriptions prior to admission.    Patient Stressors: Paediatric nurseinancial difficulties Legal issue Marital or family conflict Occupational concerns  Patient Strengths: Capable of independent living Wellsite geologistCommunication skills General fund of knowledge Motivation for treatment/growth Work skills  Treatment Modalities: Medication Management, Group therapy, Case management,  1 to 1 session with clinician, Psychoeducation, Recreational therapy.   Physician Treatment Plan for Primary Diagnosis: Schizoaffective disorder, depressive type (HCC) Long Term Goal(s): Improvement in symptoms so as ready for  discharge Improvement in symptoms so as ready for discharge   Short Term Goals: Ability to identify changes in lifestyle to reduce recurrence of condition will improve Ability to identify changes in lifestyle to reduce recurrence of condition will improve  Medication Management: Evaluate patient's response, side effects, and tolerance of medication regimen.  Therapeutic Interventions: 1 to 1 sessions, Unit Group sessions and Medication administration.  Evaluation of Outcomes: Adequate for discharge  Physician Treatment Plan for Secondary Diagnosis: Principal Problem:   Schizoaffective disorder, depressive type (HCC)  Long Term Goal(s): Improvement in symptoms so as ready for discharge Improvement in symptoms so as ready for discharge   Short Term Goals: Ability to identify changes in lifestyle to reduce recurrence of condition will improve Ability to identify changes in lifestyle to reduce recurrence of condition will improve     Medication Management: Evaluate patient's response, side effects, and tolerance of medication regimen.  Therapeutic Interventions: 1 to 1 sessions, Unit Group sessions and Medication administration.  Evaluation of Outcomes: Adequate for discharge   RN Treatment Plan for Primary Diagnosis: Schizoaffective disorder, depressive type (HCC) Long Term Goal(s): Knowledge of disease and therapeutic regimen to maintain health will improve  Short Term Goals: Ability to remain free from injury will improve, Ability to demonstrate self-control, Ability to verbalize feelings will improve, Ability to identify and develop effective coping behaviors will improve and Compliance with prescribed medications will improve  Medication Management: RN will administer medications as ordered by provider, will assess and evaluate patient's response and provide education to patient for prescribed medication. RN will report any adverse and/or side effects to prescribing  provider.  Therapeutic Interventions: 1 on 1 counseling sessions, Psychoeducation, Medication administration, Evaluate responses to treatment, Monitor vital signs and CBGs as ordered, Perform/monitor CIWA, COWS, AIMS and Fall Risk screenings as ordered, Perform wound care treatments as ordered.  Evaluation of Outcomes: Adequate for discharge   LCSW Treatment Plan for Primary Diagnosis: Schizoaffective disorder, depressive type (HCC) Long Term Goal(s): Safe transition to appropriate next level of care at discharge, Engage patient in therapeutic group addressing interpersonal concerns.  Short Term Goals: Engage patient in aftercare planning with referrals and resources, Increase social support, Facilitate acceptance of mental health diagnosis and concerns and Increase skills for wellness and recovery  Therapeutic Interventions: Assess for all discharge needs, 1 to 1 time with Social worker, Explore available resources and support systems, Assess for adequacy in community support network, Educate family and significant other(s) on suicide prevention, Complete Psychosocial Assessment, Interpersonal group therapy.  Evaluation of Outcomes: Adequate for discharge    Progress in Treatment: Attending groups: Yes. Participating in groups: Yes. Taking medication as prescribed: Yes. Toleration medication: Yes. Family/Significant other contact made: Yes, individual(s) contacted:  father. Patient understands diagnosis: Yes. Discussing patient identified problems/goals with staff: Yes. Medical problems stabilized or resolved: Yes. Denies suicidal/homicidal ideation: Yes. Issues/concerns per patient self-inventory: Yes.  New problem(s) identified: No, Describe:  None identified.  New Short Term/Long Term Goal(s):  Discharge Plan or Barriers: CSW spoke with father, Donn PieriniJeff Mikolajczak [phone] Reason for Continuation of Hospitalization: Anxiety Depression Suicidal ideation  Estimated Length of  Stay: D/C 08/07/2016  Attendees: Patient:  08/07/2016 2:12 PM  Physician: Dr. Kristine LineaJolanta Pucilowska, MD  08/07/2016 2:12 PM  Nursing: Leonia ReaderPhyllis Cobb, BSN, RN 08/07/2016 2:12 PM  RN Care Manager:  08/07/2016 2:12 PM  Social Worker: Hampton AbbotKadijah Anabia Weatherwax, MSW, LCSW-A 08/07/2016 2:12 PM  Recreational Therapist: Hershal CoriaBeth Greene, LRT, CTRS  08/07/2016 2:12 PM    Scribe for Treatment Team: Lynden OxfordKadijah R Loriann Bosserman, LCSWA 08/07/2016 2:12 PM

## 2016-08-07 NOTE — [facility] Suicide Risk Assessment (Signed)
[facility]   Principal Problem: Schizoaffective disorder, depressive type [facility](HCC) Discharge Diagnoses:  Patient Active Problem List   Diagnosis Date Noted  . Schizoaffective disorder, depressive type (HCC) [F25.1] 07/19/2016    Total Time spent with patient: 30 minutes  Musculoskeletal: Strength & Muscle Tone: within normal limits Gait & Station: normal Patient leans: N/A  Psychiatric Specialty Exam: Review of Systems  All other systems reviewed and are negative.   Blood pressure 108/73, pulse 82, temperature 98.3 F (36.8 C), resp. rate 20, height 5\' 7"  (1.702 m), weight 59 kg (130 lb), last menstrual period 06/27/2016, SpO2 100 %.Body mass index is 20.36 kg/m.  General Appearance: Casual  Eye Contact::  Fair  Speech:  Clear and Coherent409  Volume:  Normal  Mood:  Anxious  Affect:  Negative and Blunt  Thought Process:  Goal Directed and Descriptions of Associations: Intact  Orientation:  Full (Time, Place, and Person)  Thought Content:  WDL  Suicidal Thoughts:  No  Homicidal Thoughts:  No  Memory:  Immediate;   Fair Recent;   Fair Remote;   Fair  Judgement:  Impaired  Insight:  Shallow  Psychomotor Activity:  Decreased  Concentration:  Fair  Recall:  [employer]Fair  Fund of Knowledge:Fair  Language: Fair  Akathisia:  No  Handed:  Right  AIMS (if indicated):     Assets:  Communication Skills Desire for Improvement Financial Resources/Insurance Physical Health Resilience Social Support  Sleep:  Number of Hours: 7.15  Cognition: WNL  ADL's:  Intact   Mental Status Per Nursing Assessment::   On Admission:  Suicidal ideation indicated by patient, Suicide plan  Demographic Factors:  Adolescent or young adult and Unemployed  Loss Factors: Loss of significant relationship and Financial problems/change in socioeconomic status  Historical Factors: Prior suicide attempts and Impulsivity  Risk Reduction Factors:   Sense of responsibility to  family and Positive social support  Continued Clinical Symptoms:  Schizophrenia:   Depressive state Less than 22 years old  Cognitive Features That Contribute To Risk:  None    Suicide Risk:  Minimal: No identifiable suicidal ideation.  Patients presenting with no risk factors but with morbid ruminations; may be classified as minimal risk based on the severity of the depressive symptoms  Follow-up Information    [facility] Follow up.   Why:      Your ACTT Team will visit you in your home between 2-5pm the day after discharge to complete your hospital follow up assessment for medication managment, substance abuse treatment and therapy. Please call [phone] if you experience a crisis. Contact information: [facility] [address] [place], [place] [address] Ph: [phone] Crisis Line: [phone]  Fax: [phone]       [facility] Follow up.   Why:  Please arrive between the hours of 11am-1pm the on day of discharge to be admitted for transition to independent living and residential care.   Contact information: [facility] [address] [address] [place], [place] [address] Ph: [phone] Fax: [phone]          Plan Of Care/Follow-up recommendations:  Activity:  As tolerated. Diet:  Regular. Other:  Keep follow-up appointments.  Melissa LineaJolanta Tamyrah Burbage, MD 08/07/2016, 12:51 PM

## 2016-08-07 NOTE — Progress Notes (Signed)
D: Pt denies SI/HI/AVH. Pt is pleasant and cooperative, affect is flat, but brightens upon approach. Pt appears less anxious and she is interacting with peers and staff appropriately.  A: Pt was offered support and encouragement. Pt was given scheduled medications. Pt was encouraged to attend groups. Q 15 minute checks were done for safety.  R:Pt did not attend group. Pt is taking medication. Pt has no complaints.Pt receptive to treatment and safety maintained on unit.

## 2016-08-07 NOTE — Progress Notes (Signed)
Recreation Therapy Notes  Date: 12.12.17 Time: 9:30 am Location: Craft Room  Group Topic: Goal Setting  Goal Area(s) Addresses:  Patient will write at least one goal. Patient will write at least one obstacle.  Behavioral Response: Did not attend  Intervention: Recovery Goal Chart  Activity: Patients were instructed to create a recovery goal chart including goals, obstacles, the date they started working on their goals, and the date they achieved their goals.  Education: LRT educated patients on ways to celebrate reaching their goals in healthy ways.  Education Outcome: Patient did not attend group.  Clinical Observations/Feedback: Patient did not attend group.  Temperance Kelemen M, LRT/CTRS 08/07/2016 10:02 AM 

## 2016-08-07 NOTE — Discharge Summary (Signed)
Physician Discharge Summary Note  Patient:  Melissa [name] is an 20 y.o., female MRN:  [id number] DOB:  1996-05-16 Patient phone:  [phone] (home)  Patient address:   [address] [place] [address],  Total Time spent with patient: 30 minutes  Date of Admission:  07/19/2016 Date of Discharge: 08/07/2016  Reason for Admission:  Psychotic break.  Patient is a 20 year old female with history of schizoaffective disorder who was admitted after she tried to hurt herself. She was trying to hang himself in the basement of her apartment complex and was found by her landlord. Patient reported that she lives on the top floor. She reported that she has been becoming more depressed progressively. She reported that she decided to hurt herself and went to the basement and everything was already fair. She reported it was really hard to figure out her self. She stated that she currently lives by herself. Patient is unable to contract for safety at this time. Associated Signs/Symptoms: Depression Symptoms:  depressed mood, anhedonia, psychomotor retardation, fatigue, hopelessness, suicidal thoughts with specific plan, suicidal attempt, anxiety, (Hypo) Manic Symptoms:  Impulsivity, Irritable Mood, Anxiety Symptoms:  Excessive Worry, Psychotic Symptoms:  Paranoia, PTSD Symptoms: Had a traumatic exposure:  past  Past Psychiatric History: She has [facility] for in patient treatment and [facility] for out patient treatment. [facility]. She has not been compliant with treatment.  Substance Abuse History in the last 12 months:  No.  Family Psychiatric  History: None reported.  Social History: The patient has been staying at the boarding house. She has supportive father, [place] in [place], and mother who lives in [place]. She refuses to live with her father. She has [employer] insurance.  Principal Problem: Schizoaffective disorder, depressive type  [facility](HCC) Discharge Diagnoses: Patient Active Problem List   Diagnosis Date Noted  . Schizoaffective disorder, depressive type (HCC) [F25.1] 07/19/2016   Past Medical History:  Past Medical History:  Diagnosis Date  . Asthma   . Depression    History reviewed. No pertinent surgical history. Family History: History reviewed. No pertinent family history.  Social History:  History  Alcohol Use No     History  Drug Use No    Social History   Social History  . Marital status: Single    Spouse name: N/A  . Number of children: N/A  . Years of education: N/A   Social History Main Topics  . Smoking status: Never Smoker  . Smokeless tobacco: Never Used  . Alcohol use No  . Drug use: No  . Sexual activity: Not Asked   Other Topics Concern  . None   Social History Narrative  . None    Hospital Course:    Ms. Melissa KnifeWilliams Is a 20 year old single female with history of schizoaffective disorder who was admitted after she tried to hang herself in the basement of her apartment complex. She was admitted to inpatient psychiatry for medication management, safety and stabilization.  1. Suicidal ideation. Resolved. The patient is able to contract for safety. She is forward thinking and more optimistic about the future.   2.  Schizoaffective disorder, bipolar type. The patient has been noncompliant with medications in the community. She initially refused medications in the hospital and was placed on forced medications order. She was oversedated with Zyprexa but eventually agreed to take Abilify and accepted Abilify Maintena injection on 08/03/2016 for psychosis. She was started on Zoloft for depression.  3. Insomnia. She responded  well to Benadryl 25 mg.   4. Metabolic syndrome monitoring. Lipid panel, TSH and HgbA1C were normal.   5. Disposition. The patient will be discharged to transitional housing. She refused to return to her father's house. She will [facility] with [facility].   Physical Findings: AIMS: Facial and Oral Movements Muscles of Facial Expression: None, normal Lips and Perioral Area: None, normal Jaw: None, normal Tongue: None, normal,Extremity Movements Upper (arms, wrists, hands, fingers): None, normal Lower (legs, knees, ankles, toes): None, normal, Trunk Movements Neck, shoulders, hips: None, normal, Overall Severity Severity of abnormal movements (highest score from questions above): None, normal Incapacitation due to abnormal movements: None, normal Patient's awareness of abnormal movements (rate only patient's report): No Awareness, Dental Status Current problems with teeth and/or dentures?: No Does patient usually wear dentures?: No  CIWA:    COWS:     Musculoskeletal: Strength & Muscle Tone: within normal limits Gait & Station: normal Patient leans: N/A   Psychiatric Specialty Exam: Physical Exam  Nursing note and vitals reviewed.   Review of Systems  Psychiatric/Behavioral: Positive for depression.  All other systems reviewed and are negative.   Blood pressure 108/73, pulse 82, temperature 98.3 F (36.8 C), resp. rate 20, height 5\' 7"  (1.702 m), weight 59 kg (130 lb), last menstrual period 06/27/2016, SpO2 100 %.Body mass index is 20.36 kg/m.  General Appearance: Casual  Eye Contact:  Good  Speech:  Clear and Coherent  Volume:  Normal  Mood:  Anxious  Affect:  Appropriate  Thought Process:  Goal Directed and Descriptions of Associations: Intact  Orientation:  Full (Time, Place, and Person)  Thought Content:  WDL  Suicidal Thoughts:  No  Homicidal Thoughts:  No  Memory:  Immediate;   Fair Recent;   Fair Remote;   Fair  Judgement:  Impaired  Insight:  Shallow  Psychomotor Activity:  Normal  Concentration:  Concentration: Fair and Attention Span: Fair  Recall:  [employer]Fair  Fund of Knowledge:  Fair  Language:  Fair  Akathisia:  No  Handed:  Right  AIMS (if indicated):     Assets:  Communication  Skills Desire for Improvement Financial Resources/Insurance Physical Health Resilience Social Support  ADL's:  Intact  Cognition:  WNL  Sleep:  Number of Hours: 7.15     Have you used any form of tobacco in the last 30 days? (Cigarettes, Smokeless Tobacco, Cigars, and/or Pipes): No  Has this patient used any form of tobacco in the last 30 days? (Cigarettes, Smokeless Tobacco, Cigars, and/or Pipes) Yes, No  Blood Alcohol level:  Lab Results  Component Value Date   [facility] <5 07/19/2016   ETH <5 05/03/2016    Metabolic Disorder Labs:  Lab Results  Component Value Date   HGBA1C 4.8 07/22/2016   MPG 91 07/22/2016   Lab Results  Component Value Date   PROLACTIN 6.6 07/22/2016   Lab Results  Component Value Date   CHOL 126 07/22/2016   TRIG 49 07/22/2016   HDL 47 07/22/2016   CHOLHDL 2.7 07/22/2016   VLDL 10 07/22/2016   LDLCALC 69 07/22/2016    See Psychiatric Specialty Exam and Suicide Risk Assessment completed by Attending Physician prior to discharge.  Discharge destination:  Other:  transitional house  Is patient on multiple antipsychotic therapies at discharge:  No   Has Patient had three or more failed trials of antipsychotic monotherapy by history:  No  Recommended Plan for Multiple Antipsychotic Therapies: NA  Discharge Instructions  Diet - low sodium heart healthy    Complete by:  As directed    Increase activity slowly    Complete by:  As directed        Medication List    TAKE these medications     Indication  ARIPiprazole 30 MG tablet Commonly known as:  ABILIFY Take 1 tablet (30 mg total) by mouth daily.  Indication:  Schizophrenia   ARIPiprazole ER 400 MG Srer Inject 400 mg into the muscle every 30 (thirty) days. Next injectio on 09/01/2015 Start taking on:  09/02/2016  Indication:  Schizophrenia   diphenhydrAMINE 25 mg capsule Commonly known as:  BENADRYL Take 1 capsule (25 mg total) by mouth at bedtime.  Indication:  Trouble Sleeping    hydrOXYzine 50 MG tablet Commonly known as:  ATARAX/VISTARIL Take 1 tablet (50 mg total) by mouth every 6 (six) hours as needed for anxiety.  Indication:  Anxiety Neurosis   sertraline 50 MG tablet Commonly known as:  ZOLOFT Take 1 tablet (50 mg total) by mouth at bedtime.  Indication:  Major Depressive Disorder      Follow-up Information    [facility] Follow up.   Why:      Your ACTT Team will visit you in your home between 2-5pm the day after discharge to complete your hospital follow up assessment for medication managment, substance abuse treatment and therapy. Please call [phone] if you experience a crisis. Contact information: [facility] [address] [place], [place] [address] Ph: [phone] Crisis Line: [phone]  Fax: [phone]       [facility] Follow up.   Why:  Please arrive between the hours of 11am-1pm the on day of discharge to be admitted for transition to independent living and residential care.   Contact information: [facility] [address] [address] [place], [place] [address] Ph: [phone] Fax: [phone]          Follow-up recommendations:  Activity:  as tolerated. Diet:  regular. Other:  keep follow up appointments.  Comments:    Signed: Kristine Linea, MD 08/07/2016, 12:51 PM

## 2016-08-07 NOTE — Progress Notes (Signed)
Patient denies SI/HI, denies A/V hallucinations. Patient verbalizes understanding of discharge instructions, follow up care and prescriptions. Patient given all belongings from  locker. Patient escorted out by staff, transported by group home staff. 

## 2016-08-07 NOTE — [facility] Suicide Risk Assessment (Deleted)
[facility]   Principal Problem: Schizoaffective disorder, depressive type [facility](HCC) Discharge Diagnoses:  Patient Active Problem List   Diagnosis Date Noted  . Schizoaffective disorder, depressive type (HCC) [F25.1] 07/19/2016    Total Time spent with patient: 30 minutes  Musculoskeletal: Strength & Muscle Tone: within normal limits Gait & Station: normal Patient leans: N/A  Psychiatric Specialty Exam: Review of Systems  All other systems reviewed and are negative.   Blood pressure 108/73, pulse 82, temperature 98.3 F (36.8 C), resp. rate 20, height 5\' 7"  (1.702 m), weight 59 kg (130 lb), last menstrual period 06/27/2016, SpO2 100 %.Body mass index is 20.36 kg/m.  General Appearance: Casual  Eye Contact::  Fair  Speech:  Clear and Coherent409  Volume:  Normal  Mood:  Anxious  Affect:  Negative and Blunt  Thought Process:  Goal Directed and Descriptions of Associations: Intact  Orientation:  Full (Time, Place, and Person)  Thought Content:  WDL  Suicidal Thoughts:  No  Homicidal Thoughts:  No  Memory:  Immediate;   Fair Recent;   Fair Remote;   Fair  Judgement:  Impaired  Insight:  Shallow  Psychomotor Activity:  Decreased  Concentration:  Fair  Recall:  FiservFair  Fund of Knowledge:Fair  Language: Fair  Akathisia:  No  Handed:  Right  AIMS (if indicated):     Assets:  Communication Skills Desire for Improvement Financial Resources/Insurance Physical Health Resilience Social Support  Sleep:  Number of Hours: 7.15  Cognition: WNL  ADL's:  Intact   Mental Status Per Nursing Assessment::   On Admission:  Suicidal ideation indicated by patient, Suicide plan  Demographic Factors:  Adolescent or young adult and Unemployed  Loss Factors: Loss of significant relationship and Financial problems/change in socioeconomic status  Historical Factors: Prior suicide attempts and Impulsivity  Risk Reduction Factors:   Sense of responsibility to  family and Positive social support  Continued Clinical Symptoms:  Schizophrenia:   Depressive state Less than 366 years old  Cognitive Features That Contribute To Risk:  None    Suicide Risk:  Minimal: No identifiable suicidal ideation.  Patients presenting with no risk factors but with morbid ruminations; may be classified as minimal risk based on the severity of the depressive symptoms  Follow-up Information    [facility] Follow up.   Why:      Your ACTT Team will visit you in your home between 2-5pm the day after discharge to complete your hospital follow up assessment for medication managment, substance abuse treatment and therapy. Please call [phone] if you experience a crisis. Contact information: [facility] [address] [place], [place] [address] Ph: [phone] Crisis Line: [phone]  Fax: [phone]       [facility] Follow up.   Why:  Please arrive between the hours of 11am-1pm the on day of discharge to be admitted for transition to independent living and residential care.   Contact information: [facility] [address] [address] [place], [place] [address] Ph: [phone] Fax: [phone]          Plan Of Care/Follow-up recommendations:  Activity:  As tolerated. Diet:  Regular. Other:  Keep follow-up appointments.  Kristine LineaJolanta Birgit Nowling, MD 08/07/2016, 12:49 PM

## 2017-01-07 ENCOUNTER — Emergency Department
Admission: EM | Admit: 2017-01-07 | Discharge: 2017-01-07 | Disposition: A | Discharge status: Home / Self Care | Payer: Medicaid Other | Attending: Emergency Medicine | Admitting: Emergency Medicine

## 2017-01-07 ENCOUNTER — Encounter: Payer: Self-pay | Admitting: Emergency Medicine

## 2017-01-07 DIAGNOSIS — R102 Pelvic and perineal pain: Secondary | ICD-10-CM | POA: Diagnosis present

## 2017-01-07 DIAGNOSIS — J45909 Unspecified asthma, uncomplicated: Secondary | ICD-10-CM | POA: Insufficient documentation

## 2017-01-07 DIAGNOSIS — N898 Other specified noninflammatory disorders of vagina: Secondary | ICD-10-CM

## 2017-01-07 LAB — URINALYSIS, ROUTINE W REFLEX MICROSCOPIC
Bilirubin Urine: NEGATIVE
Glucose, UA: NEGATIVE mg/dL
Hgb urine dipstick: NEGATIVE
Ketones, ur: NEGATIVE mg/dL
Leukocytes, UA: NEGATIVE
Nitrite: NEGATIVE
Protein, ur: NEGATIVE mg/dL
Specific Gravity, Urine: 1.024 (ref 1.005–1.030)
pH: 5 (ref 5.0–8.0)

## 2017-01-07 LAB — CHLAMYDIA/NGC RT PCR (ARMC ONLY)
Chlamydia Tr: NOT DETECTED
N gonorrhoeae: NOT DETECTED

## 2017-01-07 LAB — WET PREP, GENITAL
Clue Cells Wet Prep HPF POC: NONE SEEN
Sperm: NONE SEEN
Trich, Wet Prep: NONE SEEN
WBC, Wet Prep HPF POC: NONE SEEN
Yeast Wet Prep HPF POC: NONE SEEN

## 2017-01-07 LAB — POCT PREGNANCY, URINE: Preg Test, Ur: NEGATIVE

## 2017-01-07 NOTE — ED Provider Notes (Signed)
[facility] Emergency Department Provider Note ____________________________________________  Time seen: 1225  I have reviewed the triage vital signs and the nursing notes.  HISTORY  Chief Complaint  Pelvic Pain  HPI Melissa [name] is a 21 y.o. female presents to the ED for evaluation of pelvic pain and abdominal pain. Patient describes Burning with urination and a white vaginal discharge with a foul odor for the last month. She notes some mild vaginal irritation described as itching. She is requesting an STD check at this time. She denies any fevers, chills, sweats, nausea, vomiting, or diarrhea.  Past Medical History:  Diagnosis Date  . Asthma   . Depression     Patient Active Problem List   Diagnosis Date Noted  . Schizoaffective disorder, depressive type (HCC) 07/19/2016    History reviewed. No pertinent surgical history.  Prior to Admission medications   Medication Sig Start Date End Date Taking? Authorizing Provider  ARIPiprazole (ABILIFY) 30 MG tablet Take 1 tablet (30 mg total) by mouth daily. 08/07/16   Pucilowska, Braulio ConteJolanta B, MD  ARIPiprazole ER 400 MG SRER Inject 400 mg into the muscle every 30 (thirty) days. Next injectio on 09/01/2015 09/02/16   Pucilowska, Braulio ConteJolanta B, MD  diphenhydrAMINE (BENADRYL) 25 mg capsule Take 1 capsule (25 mg total) by mouth at bedtime. 08/06/16   Pucilowska, Braulio ConteJolanta B, MD  hydrOXYzine (ATARAX/VISTARIL) 50 MG tablet Take 1 tablet (50 mg total) by mouth every 6 (six) hours as needed for anxiety. 08/06/16   Pucilowska, Braulio ConteJolanta B, MD  sertraline (ZOLOFT) 50 MG tablet Take 1 tablet (50 mg total) by mouth at bedtime. 08/06/16   Pucilowska, Ellin GoodieJolanta B, MD    Allergies Patient has no known allergies.  History reviewed. No pertinent family history.  Social History Social History  Substance Use Topics  . Smoking status: Never Smoker  . Smokeless tobacco: Never Used  . Alcohol use No    Review of  Systems  Constitutional: Negative for fever. Cardiovascular: Negative for chest pain. Respiratory: Negative for shortness of breath. Gastrointestinal: Negative for abdominal pain, vomiting and diarrhea. Genitourinary: Positive for dysuria. Reports vaginal discharge as above.  Skin: Negative for rash. ___________________________________________  PHYSICAL EXAM:  VITAL SIGNS: ED Triage Vitals  Enc Vitals Group     BP 01/07/17 1216 (!) 100/57     Pulse Rate 01/07/17 1216 87     Resp 01/07/17 1216 20     Temp 01/07/17 1216 98.7 F (37.1 C)     Temp Source 01/07/17 1216 Oral     SpO2 01/07/17 1216 99 %     Weight 01/07/17 1217 150 lb (68 kg)     Height 01/07/17 1217 5\' 8"  (1.727 m)     Head Circumference --      Peak Flow --      Pain Score 01/07/17 1216 0     Pain Loc --      Pain Edu? --      Excl. in GC? --     Constitutional: Alert and oriented. Well appearing and in no distress. Head: Normocephalic and atraumatic. Respiratory: Normal respiratory effort. No wheezes/rales/rhonchi. GU: Normal external genitalia.  Musculoskeletal: Nontender with normal range of motion in all extremities.  Neurologic:  Normal gait without ataxia. Normal speech and language. No gross focal neurologic deficits are appreciated. Skin:  Skin is warm, dry and intact. No rash noted. ____________________________________________   LABS (pertinent positives/negatives) Labs Reviewed  URINALYSIS, ROUTINE W REFLEX MICROSCOPIC - Abnormal; Notable for the following:  Result Value   Color, Urine YELLOW (*)    APPearance HAZY (*)    All other components within normal limits  CHLAMYDIA/NGC RT PCR ([facility] ONLY)  WET PREP, GENITAL  POC URINE PREG, ED  POCT PREGNANCY, URINE  ____________________________________________  INITIAL IMPRESSION / ASSESSMENT AND PLAN / ED COURSE  Patient with an ED evaluation of possible STD exposure and some vaginitis. Her exam is benign and her labs are negative  for any acute STD infection. She is discharged at this time with instructions to follow-up with [facility] for ongoing symptom management. She should return to the ED as needed. ____________________________________________  FINAL CLINICAL IMPRESSION(S) / ED DIAGNOSES  Final diagnoses:  Vaginal discharge      Lissa Hoard, PA-C 01/07/17 1550    Jene Every, MD 01/07/17 1555

## 2017-01-07 NOTE — Discharge Instructions (Signed)
Your exam and labs are normal today. Follow[facility] for continued symptoms and testing as needed.

## 2017-01-07 NOTE — ED Triage Notes (Signed)
Pt to ed with c/o pelvic pain. Pt states she has had burning with urination, white vaginal discharge with foul odor and mild itching x 1 month.

## 2017-01-07 NOTE — ED Notes (Signed)
See triage note  States she is having some lower abd pain and some vaginal discharge  Requesting STD check

## 2017-05-07 ENCOUNTER — Encounter (HOSPITAL_COMMUNITY): Payer: Self-pay

## 2017-05-07 DIAGNOSIS — Z79899 Other long term (current) drug therapy: Secondary | ICD-10-CM | POA: Insufficient documentation

## 2017-05-07 DIAGNOSIS — R102 Pelvic and perineal pain: Secondary | ICD-10-CM | POA: Diagnosis not present

## 2017-05-07 DIAGNOSIS — J45909 Unspecified asthma, uncomplicated: Secondary | ICD-10-CM | POA: Insufficient documentation

## 2017-05-07 DIAGNOSIS — F251 Schizoaffective disorder, depressive type: Secondary | ICD-10-CM | POA: Diagnosis not present

## 2017-05-07 DIAGNOSIS — F329 Major depressive disorder, single episode, unspecified: Secondary | ICD-10-CM | POA: Diagnosis not present

## 2017-05-07 DIAGNOSIS — R1032 Left lower quadrant pain: Secondary | ICD-10-CM | POA: Diagnosis present

## 2017-05-07 LAB — URINALYSIS, ROUTINE W REFLEX MICROSCOPIC
Glucose, UA: NEGATIVE mg/dL
Ketones, ur: NEGATIVE mg/dL
Leukocytes, UA: NEGATIVE
Nitrite: NEGATIVE
Protein, ur: 100 mg/dL — AB
Specific Gravity, Urine: >1.03 — ABNORMAL HIGH (ref 1.005–1.030)
pH: 5.5 (ref 5.0–8.0)

## 2017-05-07 LAB — COMPREHENSIVE METABOLIC PANEL
ALT: 26 U/L (ref 14–54)
AST: 19 U/L (ref 15–41)
Albumin: 3.6 g/dL (ref 3.5–5.0)
Alkaline Phosphatase: 45 U/L (ref 38–126)
Anion gap: 7 (ref 5–15)
BUN: 10 mg/dL (ref 6–20)
CO2: 23 mmol/L (ref 22–32)
Calcium: 9.3 mg/dL (ref 8.9–10.3)
Chloride: 109 mmol/L (ref 101–111)
Creatinine, Ser: 1.06 mg/dL — ABNORMAL HIGH (ref 0.44–1.00)
GFR calc Af Amer: >60 mL/min (ref >60)
GFR calc non Af Amer: >60 mL/min (ref >60)
Glucose, Bld: 96 mg/dL (ref 65–99)
Potassium: 3.5 mmol/L (ref 3.5–5.1)
Sodium: 139 mmol/L (ref 135–145)
Total Bilirubin: 0.4 mg/dL (ref 0.3–1.2)
Total Protein: 7.2 g/dL (ref 6.5–8.1)

## 2017-05-07 LAB — URINALYSIS, MICROSCOPIC (REFLEX)

## 2017-05-07 LAB — CBC
HCT: 34.9 % — ABNORMAL LOW (ref 36.0–46.0)
Hemoglobin: 11.4 g/dL — ABNORMAL LOW (ref 12.0–15.0)
MCH: 28.8 pg (ref 26.0–34.0)
MCHC: 32.7 g/dL (ref 30.0–36.0)
MCV: 88.1 fL (ref 78.0–100.0)
Platelets: 244 10*3/uL (ref 150–400)
RBC: 3.96 MIL/uL (ref 3.87–5.11)
RDW: 13.7 % (ref 11.5–15.5)
WBC: 6.1 10*3/uL (ref 4.0–10.5)

## 2017-05-07 LAB — I-STAT BETA HCG BLOOD, ED (MC, WL, AP ONLY): I-stat hCG, quantitative: <5 m[IU]/mL (ref <5)

## 2017-05-07 LAB — LIPASE, BLOOD: Lipase: 38 U/L (ref 11–51)

## 2017-05-07 NOTE — ED Triage Notes (Signed)
Pt reports LLQ pain x 1 month with nausea. Pt also reports vaginal irritation that started around the same time after unprotected sex. Denies vaginal discharge, odor, burning with urination.

## 2017-05-08 ENCOUNTER — Emergency Department (HOSPITAL_COMMUNITY)
Admission: EM | Admit: 2017-05-08 | Discharge: 2017-05-08 | Disposition: A | Discharge status: Home / Self Care | Payer: Medicaid Other | Attending: Emergency Medicine | Admitting: Emergency Medicine

## 2017-05-08 DIAGNOSIS — R102 Pelvic and perineal pain: Secondary | ICD-10-CM

## 2017-05-08 LAB — WET PREP, GENITAL
Clue Cells Wet Prep HPF POC: NONE SEEN
Sperm: NONE SEEN
Trich, Wet Prep: NONE SEEN
Yeast Wet Prep HPF POC: NONE SEEN

## 2017-05-08 LAB — GC/CHLAMYDIA PROBE AMP (~~LOC~~) NOT AT ARMC
Chlamydia: NEGATIVE
Neisseria Gonorrhea: NEGATIVE

## 2017-05-08 MED ORDER — CEFTRIAXONE SODIUM 250 MG IJ SOLR
250.0000 mg | Freq: Once | INTRAMUSCULAR | Status: AC
  Administered 2017-05-08: 250 mg via INTRAMUSCULAR
  Filled 2017-05-08: qty 250

## 2017-05-08 MED ORDER — AZITHROMYCIN 250 MG PO TABS
1000.0000 mg | ORAL_TABLET | Freq: Once | ORAL | Status: AC
  Administered 2017-05-08: 1000 mg via ORAL
  Filled 2017-05-08: qty 4

## 2017-05-08 MED ORDER — IBUPROFEN 800 MG PO TABS: 800.0000 mg | ORAL_TABLET | Freq: Three times a day (TID) | ORAL | 0 refills | Status: DC

## 2017-05-08 NOTE — ED Provider Notes (Signed)
[facility]-EMERGENCY DEPT Provider Note   CSN: [id number] Arrival date & time: 05/07/17  1631     History   Chief Complaint Chief Complaint  Patient presents with  . Abdominal Pain  . Vaginitis    HPI Melissa [name] is a 21 y.o. female.  Patient presents to the emergency department for evaluation of pelvic pain. Patient reports that she has been experiencing intermittent left lower quadrant pain for one month. She reports that worsens when she menstruates. She has not had any nausea, vomiting, diarrhea or constipation. She denies urinary symptoms. She has not had any vaginal discharge. She does report that she has had recent unprotected sex and has had "vaginal irritation".      Past Medical History:  Diagnosis Date  . Asthma   . Depression     Patient Active Problem List   Diagnosis Date Noted  . Schizoaffective disorder, depressive type (HCC) 07/19/2016    History reviewed. No pertinent surgical history.  OB History    No data available       Home Medications    Prior to Admission medications   Medication Sig Start Date End Date Taking? Authorizing Provider  ARIPiprazole (ABILIFY) 30 MG tablet Take 1 tablet (30 mg total) by mouth daily. 08/07/16   Pucilowska, Braulio Conte B, MD  ARIPiprazole ER 400 MG SRER Inject 400 mg into the muscle every 30 (thirty) days. Next injectio on 09/01/2015 09/02/16   Pucilowska, Braulio Conte B, MD  diphenhydrAMINE (BENADRYL) 25 mg capsule Take 1 capsule (25 mg total) by mouth at bedtime. 08/06/16   Pucilowska, Braulio Conte B, MD  hydrOXYzine (ATARAX/VISTARIL) 50 MG tablet Take 1 tablet (50 mg total) by mouth every 6 (six) hours as needed for anxiety. 08/06/16   Pucilowska, Braulio Conte B, MD  sertraline (ZOLOFT) 50 MG tablet Take 1 tablet (50 mg total) by mouth at bedtime. 08/06/16   Shari Prows, MD    Family History No family history on file.  Social History Social History  Substance Use Topics  . Smoking status: Never Smoker  . Smokeless  tobacco: Never Used  . Alcohol use No     Allergies   Patient has no known allergies.   Review of Systems Review of Systems  Genitourinary: Positive for pelvic pain. Negative for vaginal discharge.  All other systems reviewed and are negative.    Physical Exam Updated Vital Signs BP 121/61 (BP Location: Right Arm)   Pulse 65   Temp 98.5 F (36.9 C) (Oral)   Resp 16   Ht  (1.702 m)   Wt 74.8 kg (165 lb)   LMP 04/30/2017   SpO2 100%   BMI 25.84 kg/m   Physical Exam  Constitutional: She is oriented to person, place, and time. She appears well-developed and well-nourished. No distress.  HENT:  Head: Normocephalic and atraumatic.  Right Ear: Hearing normal.  Left Ear: Hearing normal.  Nose: Nose normal.  Mouth/Throat: Oropharynx is clear and moist and mucous membranes are normal.  Eyes: Pupils are equal, round, and reactive to light. Conjunctivae and EOM are normal.  Neck: Normal range of motion. Neck supple.  Cardiovascular: Regular rhythm, S1 normal and S2 normal.  Exam reveals no gallop and no friction rub.   No murmur heard. Pulmonary/Chest: Effort normal and breath sounds normal. No respiratory distress. She exhibits no tenderness.  Abdominal: Soft. Normal appearance and bowel sounds are normal. There is no hepatosplenomegaly. There is no tenderness. There is no rebound, no guarding, no tenderness at McBurney's  point and negative Murphy's sign. No hernia.  Genitourinary: Uterus normal. There is no rash on the right labia. There is no rash on the left labia. Cervix exhibits no motion tenderness, no discharge and no friability. Right adnexum displays tenderness. Right adnexum displays no mass. Left adnexum displays tenderness. Left adnexum displays no mass. No signs of injury around the vagina. No vaginal discharge found.  Musculoskeletal: Normal range of motion.  Neurological: She is alert and oriented to person, place, and time. She has normal strength. No cranial  nerve deficit or sensory deficit. Coordination normal. GCS eye subscore is 4. GCS verbal subscore is 5. GCS motor subscore is 6.  Skin: Skin is warm, dry and intact. No rash noted. No cyanosis.  Psychiatric: She has a normal mood and affect. Her speech is normal and behavior is normal. Thought content normal.  Nursing note and vitals reviewed.    ED Treatments / Results  Labs (all labs ordered are listed, but only abnormal results are displayed) Labs Reviewed  COMPREHENSIVE METABOLIC PANEL - Abnormal; Notable for the following:       Result Value   Creatinine, Ser 1.06 (*)    All other components within normal limits  CBC - Abnormal; Notable for the following:    Hemoglobin 11.4 (*)    HCT 34.9 (*)    All other components within normal limits  URINALYSIS, ROUTINE W REFLEX MICROSCOPIC - Abnormal; Notable for the following:    Color, Urine RED (*)    APPearance CLOUDY (*)    Specific Gravity, Urine >1.030 (*)    Hgb urine dipstick LARGE (*)    Bilirubin Urine SMALL (*)    Protein, ur 100 (*)    All other components within normal limits  URINALYSIS, MICROSCOPIC (REFLEX) - Abnormal; Notable for the following:    Bacteria, UA MANY (*)    Squamous Epithelial / LPF 0-5 (*)    All other components within normal limits  WET PREP, GENITAL  LIPASE, BLOOD  I-STAT BETA HCG BLOOD, ED ([facility], [facility], [facility] ONLY)  GC/CHLAMYDIA PROBE AMP ([place]    EKG  EKG Interpretation None       Radiology No results found.  Procedures Procedures (including critical care time)  Medications Ordered in ED Medications - No data to display   Initial Impression / Assessment and Plan / ED Course  I have reviewed the triage vital signs and the nursing notes.  Pertinent labs & imaging results that were available during my care of the patient were reviewed by me and considered in my medical decision making (see chart for details).    Patient presents with complaints of intermittent left  lower quadrant pelvic pain for one month. Abdominal exam is benign, nontender. She is not currently experiencing any pain. Bimanual examination, however, did elicit tenderness in the area of the left adnexa without any masses noted. She is currently menstruating. She did not have any cervical motion tenderness or discharge. She did, however, have pyuria and moderately blood cell count on wet prep. Will treat empirically,  follow up with OB/GYN.  Final Clinical Impressions(s) / ED Diagnoses   Final diagnoses:  Pelvic pain    New Prescriptions New Prescriptions   No medications on file     Gilda CreasePollina, Christopher J, MD 05/08/17 [phone]

## 2017-05-08 NOTE — ED Notes (Signed)
ED Provider at bedside. 

## 2017-06-16 ENCOUNTER — Encounter (HOSPITAL_COMMUNITY): Payer: Self-pay | Admitting: *Deleted

## 2017-06-16 ENCOUNTER — Emergency Department (HOSPITAL_COMMUNITY)
Admission: EM | Admit: 2017-06-16 | Discharge: 2017-06-16 | Disposition: A | Discharge status: Home / Self Care | Payer: Medicaid Other | Attending: Emergency Medicine | Admitting: Emergency Medicine

## 2017-06-16 DIAGNOSIS — N72 Inflammatory disease of cervix uteri: Secondary | ICD-10-CM | POA: Insufficient documentation

## 2017-06-16 DIAGNOSIS — R42 Dizziness and giddiness: Secondary | ICD-10-CM | POA: Diagnosis present

## 2017-06-16 DIAGNOSIS — J45909 Unspecified asthma, uncomplicated: Secondary | ICD-10-CM | POA: Insufficient documentation

## 2017-06-16 DIAGNOSIS — F259 Schizoaffective disorder, unspecified: Secondary | ICD-10-CM | POA: Insufficient documentation

## 2017-06-16 DIAGNOSIS — F329 Major depressive disorder, single episode, unspecified: Secondary | ICD-10-CM | POA: Insufficient documentation

## 2017-06-16 LAB — BASIC METABOLIC PANEL
Anion gap: 5 (ref 5–15)
BUN: 8 mg/dL (ref 6–20)
CO2: 25 mmol/L (ref 22–32)
Calcium: 9.1 mg/dL (ref 8.9–10.3)
Chloride: 105 mmol/L (ref 101–111)
Creatinine, Ser: 1.04 mg/dL — ABNORMAL HIGH (ref 0.44–1.00)
GFR calc Af Amer: >60 mL/min (ref >60)
GFR calc non Af Amer: >60 mL/min (ref >60)
Glucose, Bld: 84 mg/dL (ref 65–99)
Potassium: 3.7 mmol/L (ref 3.5–5.1)
Sodium: 135 mmol/L (ref 135–145)

## 2017-06-16 LAB — URINALYSIS, ROUTINE W REFLEX MICROSCOPIC
Bilirubin Urine: NEGATIVE
Glucose, UA: NEGATIVE mg/dL
Hgb urine dipstick: NEGATIVE
Ketones, ur: NEGATIVE mg/dL
Leukocytes, UA: NEGATIVE
Nitrite: NEGATIVE
Protein, ur: NEGATIVE mg/dL
Specific Gravity, Urine: 1.025 (ref 1.005–1.030)
pH: 5 (ref 5.0–8.0)

## 2017-06-16 LAB — CBC
HCT: 38.1 % (ref 36.0–46.0)
Hemoglobin: 12.7 g/dL (ref 12.0–15.0)
MCH: 28.7 pg (ref 26.0–34.0)
MCHC: 33.3 g/dL (ref 30.0–36.0)
MCV: 86.2 fL (ref 78.0–100.0)
Platelets: 269 10*3/uL (ref 150–400)
RBC: 4.42 MIL/uL (ref 3.87–5.11)
RDW: 13.2 % (ref 11.5–15.5)
WBC: 5 10*3/uL (ref 4.0–10.5)

## 2017-06-16 LAB — PREGNANCY, URINE: Preg Test, Ur: NEGATIVE

## 2017-06-16 MED ORDER — AZITHROMYCIN 250 MG PO TABS
1000.0000 mg | ORAL_TABLET | Freq: Once | ORAL | Status: AC
  Administered 2017-06-16: 1000 mg via ORAL
  Filled 2017-06-16: qty 4

## 2017-06-16 MED ORDER — MECLIZINE HCL 25 MG PO TABS
25.0000 mg | ORAL_TABLET | Freq: Once | ORAL | Status: AC
  Administered 2017-06-16: 25 mg via ORAL
  Filled 2017-06-16: qty 1

## 2017-06-16 MED ORDER — ONDANSETRON 4 MG PO TBDP
4.0000 mg | ORAL_TABLET | Freq: Once | ORAL | Status: AC
  Administered 2017-06-16: 4 mg via ORAL
  Filled 2017-06-16: qty 1

## 2017-06-16 MED ORDER — STERILE WATER FOR INJECTION IJ SOLN
INTRAMUSCULAR | Status: AC
  Administered 2017-06-16: 0.9 mL
  Filled 2017-06-16: qty 10

## 2017-06-16 MED ORDER — CEFTRIAXONE SODIUM 250 MG IJ SOLR
250.0000 mg | Freq: Once | INTRAMUSCULAR | Status: AC
  Administered 2017-06-16: 250 mg via INTRAMUSCULAR
  Filled 2017-06-16: qty 250

## 2017-06-16 NOTE — ED Triage Notes (Signed)
Pt ambulatory to triage with c/o feeling "faint and lightheaded" for 1 week and also has vaginal burning and itching sensation. She says it feels like the room is spinning. White vaginal discharge.

## 2017-06-16 NOTE — ED Provider Notes (Signed)
[facility] EMERGENCY DEPARTMENT Provider Note   CSN: [id number] Arrival date & time: 06/16/17  1507     History   Chief Complaint Chief Complaint  Patient presents with  . Dizziness    HPI Melissa [name] is a 21 y.o. female.  The history is provided by the patient. No language interpreter was used.  Dizziness  Quality:  Vertigo Severity:  Moderate Onset quality:  Gradual Duration:  1 week Timing:  Constant Progression:  Worsening Chronicity:  New Relieved by:  Nothing Worsened by:  Nothing Ineffective treatments:  None tried Associated symptoms: no nausea   Risk factors: no hx of vertigo   Pt also worried about vaginal discharge.  Pt reports her partner told her he had given her something.    Past Medical History:  Diagnosis Date  . Asthma   . Depression     Patient Active Problem List   Diagnosis Date Noted  . Schizoaffective disorder, depressive type (HCC) 07/19/2016    History reviewed. No pertinent surgical history.  OB History    No data available       Home Medications    Prior to Admission medications   Medication Sig Start Date End Date Taking? Authorizing Provider  ARIPiprazole ER 400 MG SRER Inject 400 mg into the muscle every 30 (thirty) days. Next injectio on 09/01/2015 09/02/16  Yes Pucilowska, Jolanta B, MD  ARIPiprazole (ABILIFY) 30 MG tablet Take 1 tablet (30 mg total) by mouth daily. Patient not taking: Reported on 06/16/2017 08/07/16   Kristine Linea B, MD  diphenhydrAMINE (BENADRYL) 25 mg capsule Take 1 capsule (25 mg total) by mouth at bedtime. Patient not taking: Reported on 06/16/2017 08/06/16   Pucilowska, Ellin Goodie, MD  hydrOXYzine (ATARAX/VISTARIL) 50 MG tablet Take 1 tablet (50 mg total) by mouth every 6 (six) hours as needed for anxiety. Patient not taking: Reported on 06/16/2017 08/06/16   Pucilowska, Braulio Conte B, MD  ibuprofen (ADVIL,MOTRIN) 800 MG tablet Take 1 tablet (800 mg total) by mouth 3 (three)  times daily. Patient not taking: Reported on 06/16/2017 05/08/17   Gilda Crease, MD  sertraline (ZOLOFT) 50 MG tablet Take 1 tablet (50 mg total) by mouth at bedtime. Patient not taking: Reported on 06/16/2017 08/06/16   Shari Prows, MD    Family History No family history on file.  Social History Social History  Substance Use Topics  . Smoking status: Never Smoker  . Smokeless tobacco: Never Used  . Alcohol use No     Allergies   Patient has no known allergies.   Review of Systems Review of Systems  Gastrointestinal: Negative for nausea.  Neurological: Positive for dizziness.  All other systems reviewed and are negative.    Physical Exam Updated Vital Signs BP 104/81   Pulse 96   Temp 98.3 F (36.8 C) (Oral)   Resp (!) 24   Ht 5\' 7"  (1.702 m)   Wt 74.8 kg (165 lb)   LMP 06/05/2017   SpO2 100%   BMI 25.84 kg/m   Physical Exam  Constitutional: She appears well-developed and well-nourished. No distress.  HENT:  Head: Normocephalic and atraumatic.  Right Ear: External ear normal.  Left Ear: External ear normal.  Mouth/Throat: Oropharynx is clear and moist.  Eyes: Pupils are equal, round, and reactive to light. Conjunctivae are normal.  Neck: Normal range of motion. Neck supple.  Cardiovascular: Normal rate and regular rhythm.   No murmur heard. Pulmonary/Chest: Effort normal and breath sounds  normal. No respiratory distress.  Abdominal: Soft. There is no tenderness.  Genitourinary: Vaginal discharge found.  Genitourinary Comments: Vaginal discharge,  Thick white,  Adnexa no masses,  Cervix nontender  Musculoskeletal: Normal range of motion. She exhibits no edema.  Neurological: She is alert.  Skin: Skin is warm and dry.  Psychiatric: She has a normal mood and affect.  Nursing note and vitals reviewed.    ED Treatments / Results  Labs (all labs ordered are listed, but only abnormal results are displayed) Labs Reviewed  BASIC  METABOLIC PANEL - Abnormal; Notable for the following:       Result Value   Creatinine, Ser 1.04 (*)    All other components within normal limits  WET PREP, GENITAL  CBC  URINALYSIS, ROUTINE W REFLEX MICROSCOPIC  PREGNANCY, URINE  GC/CHLAMYDIA PROBE AMP ([place]) [facility]    EKG  EKG Interpretation None       Radiology No results found.  Procedures Procedures (including critical care time)  Medications Ordered in ED Medications  sterile water (preservative free) injection (not administered)  ondansetron (ZOFRAN-ODT) disintegrating tablet 4 mg (4 mg Oral Given 06/16/17 1628)  meclizine (ANTIVERT) tablet 25 mg (25 mg Oral Given 06/16/17 1627)  cefTRIAXone (ROCEPHIN) injection 250 mg (250 mg Intramuscular Given 06/16/17 1745)  azithromycin (ZITHROMAX) tablet 1,000 mg (1,000 mg Oral Given 06/16/17 1746)     Initial Impression / Assessment and Plan / ED Course  I have reviewed the triage vital signs and the nursing notes.  Pertinent labs & imaging results that were available during my care of the patient were reviewed by me and considered in my medical decision making (see chart for details).       Final Clinical Impressions(s) / ED Diagnoses   Final diagnoses:  Vertigo  Cervicitis   Meds ordered this encounter  Medications  . ondansetron (ZOFRAN-ODT) disintegrating tablet 4 mg  . meclizine (ANTIVERT) tablet 25 mg  . cefTRIAXone (ROCEPHIN) injection 250 mg    Order Specific Question:   Antibiotic Indication:    Answer:   STD  . azithromycin (ZITHROMAX) tablet 1,000 mg  . sterile water (preservative free) injection    [name], Keshia   : cabinet override   New Prescriptions New Prescriptions   No medications on file   Current Meds  Medication Sig  . ARIPiprazole ER 400 MG SRER Inject 400 mg into the muscle every 30 (thirty) days. Next injectio on 09/01/2015   An After Visit Summary was printed and given to the patient.   Elson AreasSofia, Leslie K,  PA-C 06/16/17 1752    Tegeler, Canary Brimhristopher J, MD 06/17/17 [id number]

## 2017-06-16 NOTE — Discharge Instructions (Signed)
Return if any problems.

## 2017-06-17 LAB — GC/CHLAMYDIA PROBE AMP (~~LOC~~) NOT AT ARMC
Chlamydia: NEGATIVE
Neisseria Gonorrhea: NEGATIVE

## 2017-10-14 ENCOUNTER — Other Ambulatory Visit: Payer: Self-pay

## 2017-10-14 ENCOUNTER — Encounter (HOSPITAL_COMMUNITY): Payer: Self-pay

## 2017-10-14 ENCOUNTER — Emergency Department (HOSPITAL_COMMUNITY)
Admission: EM | Admit: 2017-10-14 | Discharge: 2017-10-14 | Disposition: A | Discharge status: Home / Self Care | Payer: Medicaid Other | Attending: Emergency Medicine | Admitting: Emergency Medicine

## 2017-10-14 DIAGNOSIS — R102 Pelvic and perineal pain: Secondary | ICD-10-CM

## 2017-10-14 DIAGNOSIS — N72 Inflammatory disease of cervix uteri: Secondary | ICD-10-CM | POA: Insufficient documentation

## 2017-10-14 DIAGNOSIS — J45909 Unspecified asthma, uncomplicated: Secondary | ICD-10-CM | POA: Insufficient documentation

## 2017-10-14 LAB — URINALYSIS, ROUTINE W REFLEX MICROSCOPIC
Bilirubin Urine: NEGATIVE
Glucose, UA: NEGATIVE mg/dL
Hgb urine dipstick: NEGATIVE
Ketones, ur: NEGATIVE mg/dL
Leukocytes, UA: NEGATIVE
Nitrite: NEGATIVE
Protein, ur: NEGATIVE mg/dL
Specific Gravity, Urine: 1.025 (ref 1.005–1.030)
pH: 7 (ref 5.0–8.0)

## 2017-10-14 LAB — CBC
HCT: 36.2 % (ref 36.0–46.0)
Hemoglobin: 11.8 g/dL — ABNORMAL LOW (ref 12.0–15.0)
MCH: 28.6 pg (ref 26.0–34.0)
MCHC: 32.6 g/dL (ref 30.0–36.0)
MCV: 87.9 fL (ref 78.0–100.0)
Platelets: 201 10*3/uL (ref 150–400)
RBC: 4.12 MIL/uL (ref 3.87–5.11)
RDW: 14.8 % (ref 11.5–15.5)
WBC: 6 10*3/uL (ref 4.0–10.5)

## 2017-10-14 LAB — COMPREHENSIVE METABOLIC PANEL
ALT: 23 U/L (ref 14–54)
AST: 19 U/L (ref 15–41)
Albumin: 3.8 g/dL (ref 3.5–5.0)
Alkaline Phosphatase: 66 U/L (ref 38–126)
Anion gap: 8 (ref 5–15)
BUN: 9 mg/dL (ref 6–20)
CO2: 24 mmol/L (ref 22–32)
Calcium: 9.3 mg/dL (ref 8.9–10.3)
Chloride: 109 mmol/L (ref 101–111)
Creatinine, Ser: 0.94 mg/dL (ref 0.44–1.00)
GFR calc Af Amer: >60 mL/min (ref >60)
GFR calc non Af Amer: >60 mL/min (ref >60)
Glucose, Bld: 93 mg/dL (ref 65–99)
Potassium: 4 mmol/L (ref 3.5–5.1)
Sodium: 141 mmol/L (ref 135–145)
Total Bilirubin: 0.5 mg/dL (ref 0.3–1.2)
Total Protein: 7.1 g/dL (ref 6.5–8.1)

## 2017-10-14 LAB — I-STAT BETA HCG BLOOD, ED (MC, WL, AP ONLY): I-stat hCG, quantitative: <5 m[IU]/mL (ref <5)

## 2017-10-14 LAB — LIPASE, BLOOD: Lipase: 34 U/L (ref 11–51)

## 2017-10-14 MED ORDER — CEFTRIAXONE SODIUM 250 MG IJ SOLR
250.0000 mg | Freq: Once | INTRAMUSCULAR | Status: AC
Start: 2017-10-14 — End: 2017-10-14
  Administered 2017-10-14: 250 mg via INTRAMUSCULAR
  Filled 2017-10-14: qty 250

## 2017-10-14 MED ORDER — AZITHROMYCIN 250 MG PO TABS
1000.0000 mg | ORAL_TABLET | Freq: Once | ORAL | Status: AC
  Administered 2017-10-14: 1000 mg via ORAL
  Filled 2017-10-14: qty 4

## 2017-10-14 MED ORDER — STERILE WATER FOR INJECTION IJ SOLN
INTRAMUSCULAR | Status: AC
  Administered 2017-10-14: 1 mL
  Filled 2017-10-14: qty 10

## 2017-10-14 NOTE — ED Provider Notes (Signed)
[name] EMERGENCY DEPARTMENT Provider Note   CSN: [id number] Arrival date & time: 10/14/17  1403     History   Chief Complaint Chief Complaint  Patient presents with  . Abdominal Pain    HPI Melissa [name] is a 22 y.o. female.  HPI   22 year old female presents today with complaints of pelvic pain.  Patient notes 1 month history of this, crampy sensation in her pelvic region.  She notes this feels like an STD.  She is requesting treatment for this.  She denies any vaginal bleeding or discharge.  Describes the sensation as cramping and burning.  She reports she is sexually active and does not have pain with intercourse.   Past Medical History:  Diagnosis Date  . Asthma   . Depression     Patient Active Problem List   Diagnosis Date Noted  . Schizoaffective disorder, depressive type (HCC) 07/19/2016    History reviewed. No pertinent surgical history.  OB History    No data available       Home Medications    Prior to Admission medications   Medication Sig Start Date End Date Taking? Authorizing Provider  ARIPiprazole (ABILIFY) 30 MG tablet Take 1 tablet (30 mg total) by mouth daily. Patient not taking: Reported on 06/16/2017 08/07/16   Pucilowska, Braulio Conte B, MD  ARIPiprazole ER 400 MG SRER Inject 400 mg into the muscle every 30 (thirty) days. Next injectio on 09/01/2015 09/02/16   Pucilowska, Braulio Conte B, MD  diphenhydrAMINE (BENADRYL) 25 mg capsule Take 1 capsule (25 mg total) by mouth at bedtime. Patient not taking: Reported on 06/16/2017 08/06/16   Pucilowska, Ellin Goodie, MD  hydrOXYzine (ATARAX/VISTARIL) 50 MG tablet Take 1 tablet (50 mg total) by mouth every 6 (six) hours as needed for anxiety. Patient not taking: Reported on 06/16/2017 08/06/16   Pucilowska, Braulio Conte B, MD  ibuprofen (ADVIL,MOTRIN) 800 MG tablet Take 1 tablet (800 mg total) by mouth 3 (three) times daily. Patient not taking: Reported on 06/16/2017 05/08/17   Gilda Crease, MD  sertraline (ZOLOFT) 50 MG tablet Take 1 tablet (50 mg total) by mouth at bedtime. Patient not taking: Reported on 06/16/2017 08/06/16   Shari Prows, MD    Family History No family history on file.  Social History Social History   Tobacco Use  . Smoking status: Never Smoker  . Smokeless tobacco: Never Used  Substance Use Topics  . Alcohol use: No  . Drug use: No     Allergies   Patient has no known allergies.   Review of Systems Review of Systems  All other systems reviewed and are negative.   Physical Exam Updated Vital Signs BP 114/85 (BP Location: Right Arm)   Pulse 72   Temp 99.9 F (37.7 C)   Resp 17   Wt 74.8 kg (165 lb)   LMP 09/18/2017   SpO2 99%   BMI 25.84 kg/m   Physical Exam  Constitutional: She is oriented to person, place, and time. She appears well-developed and well-nourished.  HENT:  Head: Normocephalic and atraumatic.  Eyes: Conjunctivae are normal. Pupils are equal, round, and reactive to light. Right eye exhibits no discharge. Left eye exhibits no discharge. No scleral icterus.  Neck: Normal range of motion. No JVD present. No tracheal deviation present.  Pulmonary/Chest: Effort normal. No stridor.  Abdominal: Soft. She exhibits no distension and no mass. There is no tenderness. There is no rebound and no guarding.  Neurological: She is alert  and oriented to person, place, and time. Coordination normal.  Psychiatric: She has a normal mood and affect. Her behavior is normal. Judgment and thought content normal.  Nursing note and vitals reviewed.    ED Treatments / Results  Labs (all labs ordered are listed, but only abnormal results are displayed) Labs Reviewed  CBC - Abnormal; Notable for the following components:      Result Value   Hemoglobin 11.8 (*)    All other components within normal limits  WET PREP, GENITAL  LIPASE, BLOOD  COMPREHENSIVE METABOLIC PANEL  URINALYSIS, ROUTINE W REFLEX MICROSCOPIC    I-STAT BETA HCG BLOOD, ED ([facility], [facility], [facility] ONLY)  GC/CHLAMYDIA PROBE AMP ([place]) NOT [facility]    EKG  EKG Interpretation None       Radiology No results found.  Procedures Procedures (including critical care time)  Medications Ordered in ED Medications  cefTRIAXone (ROCEPHIN) injection 250 mg (250 mg Intramuscular Given 10/14/17 1923)  azithromycin (ZITHROMAX) tablet 1,000 mg (1,000 mg Oral Given 10/14/17 1923)  sterile water (preservative free) injection (1 mL  Given 10/14/17 1923)     Initial Impression / Assessment and Plan / ED Course  I have reviewed the triage vital signs and the nursing notes.  Pertinent labs & imaging results that were available during my care of the patient were reviewed by me and considered in my medical decision making (see chart for details).     Final Clinical Impressions(s) / ED Diagnoses   Final diagnoses:  Pelvic pain  Cervicitis    Labs:   Imaging:  Consults:  Therapeutics: Azithromycin, ceftriaxone  Discharge Meds:   Assessment/Plan: 22 year old female presents today with complaints of STD.  Patient is refusing exam here.  She has reassuring workup, she is requesting treatment for STD.  I find this reasonable.  I encouraged patient return immediately with any new or worsening signs or symptoms.  She understands that without full exam I am unable to completely rule out any potentially life-threatening or disabling process.  She verbalized understanding to this.  No further questions or concerns.     ED Discharge Orders    None       Rosalio LoudHedges, Lukis Bunt, PA-C 10/15/17 2152    Linwood DibblesKnapp, Jon, MD 10/16/17 [phone]

## 2017-10-14 NOTE — Discharge Instructions (Signed)
Please read attached information. If you experience any new or worsening signs or symptoms please return to the emergency room for evaluation. Please follow-up with your primary care provider or specialist as discussed.  °

## 2017-10-14 NOTE — ED Triage Notes (Signed)
Pt presents to the ed for complaints of abdominal pain in the middle of her abdomen x 1 month

## 2017-10-14 NOTE — ED Notes (Signed)
This EMT attempted urine specimen collection from pt. Pt voiced acknowledgment

## 2017-10-29 ENCOUNTER — Other Ambulatory Visit: Payer: Self-pay

## 2017-10-29 ENCOUNTER — Encounter (HOSPITAL_COMMUNITY): Payer: Self-pay | Admitting: Emergency Medicine

## 2017-10-29 ENCOUNTER — Emergency Department (HOSPITAL_COMMUNITY)
Admission: EM | Admit: 2017-10-29 | Discharge: 2017-10-29 | Disposition: A | Discharge status: Home / Self Care | Payer: Medicaid Other | Attending: Physician Assistant | Admitting: Physician Assistant

## 2017-10-29 DIAGNOSIS — R102 Pelvic and perineal pain: Secondary | ICD-10-CM

## 2017-10-29 DIAGNOSIS — A599 Trichomoniasis, unspecified: Secondary | ICD-10-CM | POA: Insufficient documentation

## 2017-10-29 DIAGNOSIS — N898 Other specified noninflammatory disorders of vagina: Secondary | ICD-10-CM

## 2017-10-29 DIAGNOSIS — J45909 Unspecified asthma, uncomplicated: Secondary | ICD-10-CM | POA: Diagnosis not present

## 2017-10-29 DIAGNOSIS — Z79899 Other long term (current) drug therapy: Secondary | ICD-10-CM | POA: Diagnosis not present

## 2017-10-29 HISTORY — DX: Bipolar disorder, unspecified: F31.9

## 2017-10-29 LAB — CBC
HCT: 36.7 % (ref 36.0–46.0)
Hemoglobin: 12.2 g/dL (ref 12.0–15.0)
MCH: 28.5 pg (ref 26.0–34.0)
MCHC: 33.2 g/dL (ref 30.0–36.0)
MCV: 85.7 fL (ref 78.0–100.0)
Platelets: 282 10*3/uL (ref 150–400)
RBC: 4.28 MIL/uL (ref 3.87–5.11)
RDW: 13.5 % (ref 11.5–15.5)
WBC: 5.7 10*3/uL (ref 4.0–10.5)

## 2017-10-29 LAB — WET PREP, GENITAL
Clue Cells Wet Prep HPF POC: NONE SEEN
Sperm: NONE SEEN
Yeast Wet Prep HPF POC: NONE SEEN

## 2017-10-29 LAB — COMPREHENSIVE METABOLIC PANEL
ALT: 18 U/L (ref 14–54)
AST: 21 U/L (ref 15–41)
Albumin: 4.1 g/dL (ref 3.5–5.0)
Alkaline Phosphatase: 70 U/L (ref 38–126)
Anion gap: 9 (ref 5–15)
BUN: 6 mg/dL (ref 6–20)
CO2: 24 mmol/L (ref 22–32)
Calcium: 9.3 mg/dL (ref 8.9–10.3)
Chloride: 107 mmol/L (ref 101–111)
Creatinine, Ser: 0.97 mg/dL (ref 0.44–1.00)
GFR calc Af Amer: >60 mL/min (ref >60)
GFR calc non Af Amer: >60 mL/min (ref >60)
Glucose, Bld: 100 mg/dL — ABNORMAL HIGH (ref 65–99)
Potassium: 3.5 mmol/L (ref 3.5–5.1)
Sodium: 140 mmol/L (ref 135–145)
Total Bilirubin: 0.7 mg/dL (ref 0.3–1.2)
Total Protein: 7.4 g/dL (ref 6.5–8.1)

## 2017-10-29 LAB — RAPID HIV SCREEN (HIV 1/2 AB+AG)
HIV 1/2 Antibodies: NONREACTIVE
HIV-1 P24 Antigen - HIV24: NONREACTIVE

## 2017-10-29 LAB — LIPASE, BLOOD: Lipase: 34 U/L (ref 11–51)

## 2017-10-29 LAB — I-STAT BETA HCG BLOOD, ED (MC, WL, AP ONLY): I-stat hCG, quantitative: <5 m[IU]/mL (ref <5)

## 2017-10-29 LAB — URINALYSIS, ROUTINE W REFLEX MICROSCOPIC
Bilirubin Urine: NEGATIVE
Glucose, UA: NEGATIVE mg/dL
Hgb urine dipstick: NEGATIVE
Ketones, ur: 20 mg/dL — AB
Leukocytes, UA: NEGATIVE
Nitrite: NEGATIVE
Protein, ur: NEGATIVE mg/dL
Specific Gravity, Urine: 1.029 (ref 1.005–1.030)
pH: 6 (ref 5.0–8.0)

## 2017-10-29 MED ORDER — METRONIDAZOLE 500 MG PO TABS: 500.0000 mg | ORAL_TABLET | Freq: Two times a day (BID) | ORAL | 0 refills | Status: DC

## 2017-10-29 MED ORDER — AZITHROMYCIN 250 MG PO TABS
1000.0000 mg | ORAL_TABLET | Freq: Once | ORAL | Status: AC
  Administered 2017-10-29: 1000 mg via ORAL
  Filled 2017-10-29: qty 4

## 2017-10-29 MED ORDER — CEFTRIAXONE SODIUM 250 MG IJ SOLR
250.0000 mg | Freq: Once | INTRAMUSCULAR | Status: AC
  Administered 2017-10-29: 250 mg via INTRAMUSCULAR
  Filled 2017-10-29: qty 250

## 2017-10-29 NOTE — ED Triage Notes (Signed)
abd pain x 1 month  She was seen here and it went away now pain is back left sided,  Hurts to pee , denies any vag d/c or odor, lmp 2/21 was not normal she states , last bm  Had diarrhea

## 2017-10-29 NOTE — ED Notes (Signed)
Upon entering patients room found her crying with blanket pulled over her head. Patient denied any help at this time. MD notified.

## 2017-10-29 NOTE — Discharge Instructions (Signed)
Please read attached information. If you experience any new or worsening signs or symptoms please return to the emergency room for evaluation. Please follow-up with your primary care provider or specialist as discussed. Please use medication prescribed only as directed and discontinue taking if you have any concerning signs or symptoms.   °

## 2017-10-29 NOTE — ED Provider Notes (Signed)
[facility] EM[facility]GENCY [facility] Provider Note   CSN: [id number] Arrival date & time: 10/29/17  1140     History   Chief Complaint Chief Complaint  Patient presents with  . Abdominal Pain    HPI Melissa [name] is a 22 y.o. female.  HPI   22 year old female presents today with complaints of pelvic pain.  Patient notes 2 days ago she developed pain in her left lower pelvis sharp in nature pressure-like coming and going.  She notes this is similar to previous episodes.  Patient notes that she was seen 10/14/2017 for similar presentation.  She notes she was treated with antibiotics which resolved her symptoms.  She notes the symptoms again returned 2 days ago with similar sensation.  Patient notes she is having some vaginal discharge, reports she is sexually active with one female.  She notes this female is known to have an STD, but reports that he will not take medicine to treat it.  Patient denies any upper abdominal pain, vomiting, fever or chills.    Past Medical History:  Diagnosis Date  . Asthma   . Bipolar 1 disorder (HCC)   . Depression     Patient Active Problem List   Diagnosis Date Noted  . Schizoaffective disorder, depressive type (HCC) 07/19/2016    History reviewed. No pertinent surgical history.  OB History    No data available       Home Medications    Prior to Admission medications   Medication Sig Start Date End Date Taking? Authorizing Provider  ARIPiprazole ER 400 MG SRER Inject 400 mg into the muscle every 30 (thirty) days. Next injectio on 09/01/2015 Patient taking differently: Inject 400 mg into the muscle See admin instructions. Every three weeks 09/02/16  Yes Pucilowska, Jolanta B, MD  ARIPiprazole (ABILIFY) 30 MG tablet Take 1 tablet (30 mg total) by mouth daily. Patient not taking: Reported on 06/16/2017 08/07/16   Kristine LineaPucilowska, Jolanta B, MD  diphenhydrAMINE (BENADRYL) 25 mg capsule Take 1 capsule (25 mg total) by mouth at  bedtime. Patient not taking: Reported on 06/16/2017 08/06/16   Pucilowska, Ellin GoodieJolanta B, MD  hydrOXYzine (ATARAX/VISTARIL) 50 MG tablet Take 1 tablet (50 mg total) by mouth every 6 (six) hours as needed for anxiety. Patient not taking: Reported on 06/16/2017 08/06/16   Pucilowska, Ellin GoodieJolanta B, MD  metroNIDAZOLE (FLAGYL) 500 MG tablet Take 1 tablet (500 mg total) by mouth 2 (two) times daily. 10/29/17   Donelda Mailhot, Tinnie GensJeffrey, PA-C  sertraline (ZOLOFT) 50 MG tablet Take 1 tablet (50 mg total) by mouth at bedtime. Patient not taking: Reported on 06/16/2017 08/06/16   Shari ProwsPucilowska, Jolanta B, MD    Family History No family history on file.  Social History Social History   Tobacco Use  . Smoking status: Never Smoker  . Smokeless tobacco: Never Used  Substance Use Topics  . Alcohol use: No  . Drug use: No     Allergies   Patient has no known allergies.   Review of Systems Review of Systems  All other systems reviewed and are negative.    Physical Exam Updated Vital Signs BP 128/89 (BP Location: Right Arm)   Pulse 76   Temp 97.8 F (36.6 C) (Oral)   Resp 18   Ht 5\' 7"  (1.702 m)   Wt 70.3 kg (155 lb)   SpO2 100%   BMI 24.28 kg/m   Physical Exam  Constitutional: She is oriented to person, place, and time. She appears well-developed and well-nourished.  HENT:  Head: Normocephalic and atraumatic.  Eyes: Conjunctivae are normal. Pupils are equal, round, and reactive to light. Right eye exhibits no discharge. Left eye exhibits no discharge. No scleral icterus.  Neck: Normal range of motion. No JVD present. No tracheal deviation present.  Pulmonary/Chest: Effort normal. No stridor.  Abdominal: Soft. She exhibits no distension and no mass. There is tenderness. There is no rebound and no guarding. No hernia.  Minor tenderness palpation left lower pelvis  Genitourinary:  Genitourinary Comments: Small amount of discharge noted in the vaginal vault, no significant cervical motion tenderness,  no adnexal tenderness or masses  Neurological: She is alert and oriented to person, place, and time. Coordination normal.  Psychiatric: She has a normal mood and affect. Her behavior is normal. Judgment and thought content normal.  Nursing note and vitals reviewed.    ED Treatments / Results  Labs (all labs ordered are listed, but only abnormal results are displayed) Labs Reviewed  WET PREP, GENITAL - Abnormal; Notable for the following components:      Result Value   Trich, Wet Prep PRESENT (*)    WBC, Wet Prep HPF POC FEW (*)    All other components within normal limits  COMPREHENSIVE METABOLIC PANEL - Abnormal; Notable for the following components:   Glucose, Bld 100 (*)    All other components within normal limits  URINALYSIS, ROUTINE W REFLEX MICROSCOPIC - Abnormal; Notable for the following components:   Ketones, ur 20 (*)    All other components within normal limits  URINE CULTURE  LIPASE, BLOOD  CBC  RAPID HIV SCREEN (HIV 1/2 AB+AG)  HIV ANTIBODY (ROUTINE TESTING)  I-STAT BETA HCG BLOOD, ED ([facility], [facility], [facility] ONLY)  GC/CHLAMYDIA PROBE AMP ([place]) NOT AT [place]    EKG  EKG Interpretation None       Radiology No results found.  Procedures Procedures (including critical care time)  Medications Ordered in ED Medications  cefTRIAXone (ROCEPHIN) injection 250 mg (250 mg Intramuscular Given 10/29/17 1512)  azithromycin (ZITHROMAX) tablet 1,000 mg (1,000 mg Oral Given 10/29/17 1509)     Initial Impression / Assessment and Plan / ED Course  I have reviewed the triage vital signs and the nursing notes.  Pertinent labs & imaging results that were available during my care of the patient were reviewed by me and considered in my medical decision making (see chart for details).      Final Clinical Impressions(s) / ED Diagnoses   Final diagnoses:  Vaginal discharge  Pelvic pain  Trichomoniasis   Labs: I-STAT beta-hCG, lipase, CMP, CBC  Imaging:    Consults:  Therapeutics: Ceftriaxone, azithromycin  Discharge Meds:   Assessment/Plan: 22 year old female presents today with complaints of abdominal pain and vaginal discharge.  Similar to previous episodes of STDs.  Patient with no signs of PID, no systemic complaints, no upper abdominal pain.  Patient will be treated with antibiotics, she was given strict return precautions.  She verbalized understanding and agreement to today's plan.  I have low suspicion for ovarian torsion, adnexal mass, or any other life-threatening etiology at this time.   Patient's wet prep positive for trichomoniasis, discharged with metronidazole.        ED Discharge Orders        Ordered    metroNIDAZOLE (FLAGYL) 500 MG tablet  2 times daily     10/29/17 1639       Eyvonne Mechanic, PA-C 10/29/17 1642    Mackuen, Cindee Salt, MD 10/30/17 [phone]

## 2017-10-30 LAB — GC/CHLAMYDIA PROBE AMP (~~LOC~~) NOT AT ARMC
Chlamydia: NEGATIVE
Neisseria Gonorrhea: NEGATIVE

## 2017-10-30 LAB — HIV ANTIBODY (ROUTINE TESTING W REFLEX): HIV Screen 4th Generation wRfx: NONREACTIVE

## 2017-10-31 LAB — URINE CULTURE: Culture: <10000 — AB

## 2017-12-06 ENCOUNTER — Emergency Department (HOSPITAL_COMMUNITY)
Admission: EM | Admit: 2017-12-06 | Discharge: 2017-12-06 | Disposition: A | Discharge status: Home / Self Care | Payer: Medicaid Other | Attending: Emergency Medicine | Admitting: Emergency Medicine

## 2017-12-06 ENCOUNTER — Encounter (HOSPITAL_COMMUNITY): Payer: Self-pay

## 2017-12-06 ENCOUNTER — Other Ambulatory Visit: Payer: Self-pay

## 2017-12-06 DIAGNOSIS — N760 Acute vaginitis: Secondary | ICD-10-CM | POA: Diagnosis not present

## 2017-12-06 DIAGNOSIS — Z79899 Other long term (current) drug therapy: Secondary | ICD-10-CM | POA: Diagnosis not present

## 2017-12-06 DIAGNOSIS — R1030 Lower abdominal pain, unspecified: Secondary | ICD-10-CM | POA: Diagnosis present

## 2017-12-06 DIAGNOSIS — B9689 Other specified bacterial agents as the cause of diseases classified elsewhere: Secondary | ICD-10-CM

## 2017-12-06 LAB — URINALYSIS, ROUTINE W REFLEX MICROSCOPIC
Bilirubin Urine: NEGATIVE
Glucose, UA: NEGATIVE mg/dL
Hgb urine dipstick: NEGATIVE
Ketones, ur: NEGATIVE mg/dL
Leukocytes, UA: NEGATIVE
Nitrite: NEGATIVE
Protein, ur: NEGATIVE mg/dL
Specific Gravity, Urine: 1.023 (ref 1.005–1.030)
pH: 6 (ref 5.0–8.0)

## 2017-12-06 LAB — WET PREP, GENITAL
Sperm: NONE SEEN
Trich, Wet Prep: NONE SEEN
Yeast Wet Prep HPF POC: NONE SEEN

## 2017-12-06 LAB — POC URINE PREG, ED: Preg Test, Ur: NEGATIVE

## 2017-12-06 MED ORDER — METRONIDAZOLE 500 MG PO TABS: 500.0000 mg | ORAL_TABLET | Freq: Two times a day (BID) | ORAL | 0 refills | Status: DC

## 2017-12-06 NOTE — Discharge Instructions (Signed)
It was our pleasure to provide your ER care today - we hope that you feel better.  Take antibiotic as prescribed. Do not drink alcohol when taking this antibiotic.  Follow up with primary care doctor in 1 week if symptoms fail to improve/resolve.  Return to ER if worse, new symptoms, fevers, other concern.

## 2017-12-06 NOTE — ED Provider Notes (Signed)
[name] EMERGENCY DEPARTMENT Provider Note   CSN: [id number] Arrival date & time: 12/06/17  1706     History   Chief Complaint Chief Complaint  Patient presents with  . Exposure to STD  . Abdominal Pain    HPI TAE ROBAK is a 22 y.o. female.  Patient c/o vaginal discharge, and intermittent lower abd crampy pain for past 1-2 weeks. Abd/pelvic area pain not constant, intermittent, mild, non radiating. lnmp 2 weeks ago. Denies vaginal bleeding. Some malodor to d/c. No known std exposure. No fever or chills. No dysuria.   The history is provided by the patient.  Exposure to STD  Associated symptoms include abdominal pain.  Abdominal Pain   Pertinent negatives include fever, diarrhea, vomiting and dysuria.    Past Medical History:  Diagnosis Date  . Asthma   . Bipolar 1 disorder (HCC)   . Depression     Patient Active Problem List   Diagnosis Date Noted  . Schizoaffective disorder, depressive type (HCC) 07/19/2016    History reviewed. No pertinent surgical history.   OB History   None      Home Medications    Prior to Admission medications   Medication Sig Start Date End Date Taking? Authorizing Provider  ARIPiprazole (ABILIFY) 30 MG tablet Take 1 tablet (30 mg total) by mouth daily. Patient not taking: Reported on 06/16/2017 08/07/16   Pucilowska, Braulio Conte B, MD  ARIPiprazole ER 400 MG SRER Inject 400 mg into the muscle every 30 (thirty) days. Next injectio on 09/01/2015 Patient taking differently: Inject 400 mg into the muscle See admin instructions. Every three weeks 09/02/16   Pucilowska, Braulio Conte B, MD  diphenhydrAMINE (BENADRYL) 25 mg capsule Take 1 capsule (25 mg total) by mouth at bedtime. Patient not taking: Reported on 06/16/2017 08/06/16   Pucilowska, Ellin Goodie, MD  hydrOXYzine (ATARAX/VISTARIL) 50 MG tablet Take 1 tablet (50 mg total) by mouth every 6 (six) hours as needed for anxiety. Patient not taking: Reported on 06/16/2017  08/06/16   Pucilowska, Ellin Goodie, MD  metroNIDAZOLE (FLAGYL) 500 MG tablet Take 1 tablet (500 mg total) by mouth 2 (two) times daily. 10/29/17   Hedges, Tinnie Gens, PA-C  sertraline (ZOLOFT) 50 MG tablet Take 1 tablet (50 mg total) by mouth at bedtime. Patient not taking: Reported on 06/16/2017 08/06/16   Shari Prows, MD    Family History History reviewed. No pertinent family history.  Social History Social History   Tobacco Use  . Smoking status: Never Smoker  . Smokeless tobacco: Never Used  Substance Use Topics  . Alcohol use: No  . Drug use: No     Allergies   Patient has no known allergies.   Review of Systems Review of Systems  Constitutional: Negative for fever.  Gastrointestinal: Positive for abdominal pain. Negative for diarrhea and vomiting.  Genitourinary: Positive for vaginal discharge. Negative for dysuria.  Musculoskeletal: Negative for back pain.  Skin: Negative for rash.     Physical Exam Updated Vital Signs BP 106/76   Pulse 66   Temp (!) 97.4 F (36.3 C)   Resp 16   Ht 1.727 m (5\' 8" )   Wt 72.6 kg (160 lb)   LMP 11/14/2017 (Exact Date)   SpO2 100%   BMI 24.33 kg/m   Physical Exam  Constitutional: She appears well-developed and well-nourished. No distress.  Eyes: Conjunctivae are normal. No scleral icterus.  Neck: Neck supple. No tracheal deviation present.  Cardiovascular: Normal rate.  Pulmonary/Chest: Effort normal.  No respiratory distress.  Abdominal: Soft. Normal appearance and bowel sounds are normal. She exhibits no distension and no mass. There is no tenderness. There is no rebound and no guarding. No hernia.  Genitourinary:  Genitourinary Comments: No cva tenderness. Normal external gu exam. No cmt. No adnexal tenderness or masses. No discharge from cervix. +whitish vag discharge.  Musculoskeletal: She exhibits no edema.  Neurological: She is alert.  Skin: Skin is warm and dry. No rash noted.  Psychiatric: She has a normal  mood and affect.  Nursing note and vitals reviewed.    ED Treatments / Results  Labs (all labs ordered are listed, but only abnormal results are displayed) Results for orders placed or performed during the hospital encounter of 12/06/17  Wet prep, genital  Result Value Ref Range   Yeast Wet Prep HPF POC NONE SEEN NONE SEEN   Trich, Wet Prep NONE SEEN NONE SEEN   Clue Cells Wet Prep HPF POC PRESENT (A) NONE SEEN   WBC, Wet Prep HPF POC FEW (A) NONE SEEN   Sperm NONE SEEN   Urinalysis, Routine w reflex microscopic  Result Value Ref Range   Color, Urine YELLOW YELLOW   APPearance HAZY (A) CLEAR   Specific Gravity, Urine 1.023 1.005 - 1.030   pH 6.0 5.0 - 8.0   Glucose, UA NEGATIVE NEGATIVE mg/dL   Hgb urine dipstick NEGATIVE NEGATIVE   Bilirubin Urine NEGATIVE NEGATIVE   Ketones, ur NEGATIVE NEGATIVE mg/dL   Protein, ur NEGATIVE NEGATIVE mg/dL   Nitrite NEGATIVE NEGATIVE   Leukocytes, UA NEGATIVE NEGATIVE  POC Urine Pregnancy, ED ([facility])  Result Value Ref Range   Preg Test, Ur NEGATIVE NEGATIVE    EKG None  Radiology No results found.  Procedures Procedures (including critical care time)  Medications Ordered in ED Medications - No data to display   Initial Impression / Assessment and Plan / ED Course  I have reviewed the triage vital signs and the nursing notes.  Pertinent labs & imaging results that were available during my care of the patient were reviewed by me and considered in my medical decision making (see chart for details).  Labs sent.   Pelvic set up.  Labs reviewed - no uti. u preg neg.  Wet prep with clue cells - will tx.   Final Clinical Impressions(s) / ED Diagnoses   Final diagnoses:  None    ED Discharge Orders    None       Cathren LaineSteinl, Naidelyn Parrella, MD 12/06/17 2221

## 2017-12-06 NOTE — ED Triage Notes (Addendum)
Pt endorses pelvic cramping for several months with white foul smelling discharge. Pt has hx of trichamonas and was treated here for it and believes she may have it again. VSS.

## 2017-12-09 LAB — GC/CHLAMYDIA PROBE AMP (~~LOC~~) NOT AT ARMC
Chlamydia: NEGATIVE
Neisseria Gonorrhea: NEGATIVE

## 2018-01-03 ENCOUNTER — Other Ambulatory Visit: Payer: Self-pay

## 2018-01-03 ENCOUNTER — Emergency Department (HOSPITAL_COMMUNITY): Payer: Medicaid Other

## 2018-01-03 ENCOUNTER — Encounter (HOSPITAL_COMMUNITY): Payer: Self-pay

## 2018-01-03 ENCOUNTER — Emergency Department (HOSPITAL_COMMUNITY)
Admission: EM | Admit: 2018-01-03 | Discharge: 2018-01-04 | Disposition: A | Discharge status: Home / Self Care | Payer: Medicaid Other | Attending: Emergency Medicine | Admitting: Emergency Medicine

## 2018-01-03 DIAGNOSIS — R1032 Left lower quadrant pain: Secondary | ICD-10-CM | POA: Insufficient documentation

## 2018-01-03 DIAGNOSIS — N898 Other specified noninflammatory disorders of vagina: Secondary | ICD-10-CM | POA: Diagnosis not present

## 2018-01-03 LAB — URINALYSIS, ROUTINE W REFLEX MICROSCOPIC
Bilirubin Urine: NEGATIVE
Glucose, UA: NEGATIVE mg/dL
Hgb urine dipstick: NEGATIVE
Ketones, ur: NEGATIVE mg/dL
Leukocytes, UA: NEGATIVE
Nitrite: NEGATIVE
Protein, ur: NEGATIVE mg/dL
Specific Gravity, Urine: 1.017 (ref 1.005–1.030)
pH: 5 (ref 5.0–8.0)

## 2018-01-03 LAB — COMPREHENSIVE METABOLIC PANEL
ALT: 17 U/L (ref 14–54)
AST: 17 U/L (ref 15–41)
Albumin: 3.6 g/dL (ref 3.5–5.0)
Alkaline Phosphatase: 59 U/L (ref 38–126)
Anion gap: 6 (ref 5–15)
BUN: 7 mg/dL (ref 6–20)
CO2: 25 mmol/L (ref 22–32)
Calcium: 9.3 mg/dL (ref 8.9–10.3)
Chloride: 109 mmol/L (ref 101–111)
Creatinine, Ser: 0.96 mg/dL (ref 0.44–1.00)
GFR calc Af Amer: >60 mL/min (ref >60)
GFR calc non Af Amer: >60 mL/min (ref >60)
Glucose, Bld: 89 mg/dL (ref 65–99)
Potassium: 4 mmol/L (ref 3.5–5.1)
Sodium: 140 mmol/L (ref 135–145)
Total Bilirubin: 0.4 mg/dL (ref 0.3–1.2)
Total Protein: 6.9 g/dL (ref 6.5–8.1)

## 2018-01-03 LAB — WET PREP, GENITAL
Sperm: NONE SEEN
Trich, Wet Prep: NONE SEEN
Yeast Wet Prep HPF POC: NONE SEEN

## 2018-01-03 LAB — I-STAT BETA HCG BLOOD, ED (MC, WL, AP ONLY): I-stat hCG, quantitative: <5 m[IU]/mL

## 2018-01-03 LAB — CBC
HCT: 34.9 % — ABNORMAL LOW (ref 36.0–46.0)
Hemoglobin: 11.5 g/dL — ABNORMAL LOW (ref 12.0–15.0)
MCH: 28.9 pg (ref 26.0–34.0)
MCHC: 33 g/dL (ref 30.0–36.0)
MCV: 87.7 fL (ref 78.0–100.0)
Platelets: 226 K/uL (ref 150–400)
RBC: 3.98 MIL/uL (ref 3.87–5.11)
RDW: 13.9 % (ref 11.5–15.5)
WBC: 4.5 K/uL (ref 4.0–10.5)

## 2018-01-03 LAB — LIPASE, BLOOD: Lipase: 36 U/L (ref 11–51)

## 2018-01-03 MED ORDER — AZITHROMYCIN 250 MG PO TABS
1000.0000 mg | ORAL_TABLET | Freq: Once | ORAL | Status: AC
  Administered 2018-01-04: 1000 mg via ORAL
  Filled 2018-01-03: qty 4

## 2018-01-03 MED ORDER — CEFTRIAXONE SODIUM 250 MG IJ SOLR
250.0000 mg | Freq: Once | INTRAMUSCULAR | Status: AC
  Administered 2018-01-04: 250 mg via INTRAMUSCULAR
  Filled 2018-01-03: qty 250

## 2018-01-03 NOTE — ED Triage Notes (Signed)
Pt presents for LLQ pain x 2-3 days. Reports some dysuria. Denies vaginal bleeding or discharge. Denies N/V/D.

## 2018-01-03 NOTE — ED Notes (Signed)
Pt is aware of the wait time and that a urine is needed pt offered a snack no complaints noted at this time

## 2018-01-03 NOTE — ED Notes (Signed)
Patient transported to Ultrasound 

## 2018-01-04 LAB — RPR: RPR Ser Ql: NONREACTIVE

## 2018-01-04 LAB — HIV ANTIBODY (ROUTINE TESTING W REFLEX): HIV Screen 4th Generation wRfx: NONREACTIVE

## 2018-01-04 MED ORDER — STERILE WATER FOR INJECTION IJ SOLN
INTRAMUSCULAR | Status: AC
  Administered 2018-01-04: 10 mL
  Filled 2018-01-04: qty 10

## 2018-01-04 MED ORDER — METRONIDAZOLE 500 MG PO TABS: 500.0000 mg | ORAL_TABLET | Freq: Two times a day (BID) | ORAL | 0 refills | Status: DC

## 2018-01-04 NOTE — Discharge Instructions (Addendum)
Your work-up has been reassuring.  Have been treated for an STD in the ED.  Also started on Flagyl for bacterial vaginosis.  Do not drink alcohol this medication.  Motrin or Tylenol for pain.  Follow-up with his hospital symptoms not improved return the ED with any worsening symptoms.

## 2018-01-04 NOTE — ED Provider Notes (Signed)
[facility] EMERGENCY DEPARTMENT Provider Note   CSN: [id number] Arrival date & time: 01/03/18  1246     History   Chief Complaint Chief Complaint  Patient presents with  . Abdominal Pain    HPI Melissa [name] is a 22 y.o. female.  HPI 22 year old African-American female past medical history significant for bipolar disorder, schizoaffective disorder, depression, asthma presents to the emergency department today for evaluation of left lower quadrant abdominal pain.  Reports the pain started 2 to 3 days ago.  The pain is intermittent.  Does not radiate.  Nothing makes better or worse.  States the pain is approximately a 3/10.  States that she also thinks that she may have an STD.  Reports vaginal discharge that is white in nature and about any odor.  Patient reports some dysuria but denies any vaginal bleeding, hematuria, urgency or frequency.  Patient states that she is sexually active with several partners and does not always use protection.  Patient has not been told that any of her partners have an STD.  She has not taken anything for the pain prior to arrival.  Denies any associated nausea, vomiting, change in her bowel habits, fevers or chills. Past Medical History:  Diagnosis Date  . Asthma   . Bipolar 1 disorder (HCC)   . Depression     Patient Active Problem List   Diagnosis Date Noted  . Schizoaffective disorder, depressive type (HCC) 07/19/2016    History reviewed. No pertinent surgical history.   OB History   None      Home Medications    Prior to Admission medications   Medication Sig Start Date End Date Taking? Authorizing Provider  ARIPiprazole ER 400 MG SRER Inject 400 mg into the muscle every 30 (thirty) days. Next injectio on 09/01/2015 Patient taking differently: Inject 400 mg into the muscle every 21 ( twenty-one) days.  09/02/16  Yes Pucilowska, Jolanta B, MD  ARIPiprazole (ABILIFY) 30 MG tablet Take 1 tablet (30 mg total) by mouth  daily. Patient not taking: Reported on 06/16/2017 08/07/16   Kristine Linea B, MD  diphenhydrAMINE (BENADRYL) 25 mg capsule Take 1 capsule (25 mg total) by mouth at bedtime. Patient not taking: Reported on 06/16/2017 08/06/16   Pucilowska, Ellin Goodie, MD  hydrOXYzine (ATARAX/VISTARIL) 50 MG tablet Take 1 tablet (50 mg total) by mouth every 6 (six) hours as needed for anxiety. Patient not taking: Reported on 06/16/2017 08/06/16   Pucilowska, Ellin Goodie, MD  metroNIDAZOLE (FLAGYL) 500 MG tablet Take 1 tablet (500 mg total) by mouth 2 (two) times daily with a meal. DO NOT CONSUME ALCOHOL WHILE TAKING THIS MEDICATION. 01/04/18   Rise Mu, PA-C  sertraline (ZOLOFT) 50 MG tablet Take 1 tablet (50 mg total) by mouth at bedtime. Patient not taking: Reported on 06/16/2017 08/06/16   Shari Prows, MD    Family History No family history on file.  Social History Social History   Tobacco Use  . Smoking status: Never Smoker  . Smokeless tobacco: Never Used  Substance Use Topics  . Alcohol use: No  . Drug use: No     Allergies   Patient has no known allergies.   Review of Systems Review of Systems  All other systems reviewed and are negative.    Physical Exam Updated Vital Signs BP 123/86   Pulse 95   Temp 98.4 F (36.9 C) (Oral)   Resp 16   Ht  (1.702 m)   Wt  70.3 kg (155 lb)   LMP 12/12/2017 (Exact Date)   SpO2 100%   BMI 24.28 kg/m   Physical Exam  Constitutional: She is oriented to person, place, and time. She appears well-developed and well-nourished.  Non-toxic appearance. No distress.  HENT:  Head: Normocephalic and atraumatic.  Nose: Nose normal.  Mouth/Throat: Oropharynx is clear and moist.  Eyes: Pupils are equal, round, and reactive to light. Conjunctivae are normal. Right eye exhibits no discharge. Left eye exhibits no discharge.  Neck: Normal range of motion. Neck supple.  Cardiovascular: Normal rate, regular rhythm, normal heart  sounds and intact distal pulses.  Pulmonary/Chest: Effort normal and breath sounds normal. No respiratory distress. She exhibits no tenderness.  Abdominal: Soft. Normal appearance and bowel sounds are normal. There is tenderness (deep palpation) in the left lower quadrant. There is no rigidity, no rebound, no guarding, no CVA tenderness, no tenderness at McBurney's point and negative Murphy's sign.  Genitourinary:  Genitourinary Comments: Chaperone present for exam. No external lesions, swelling, erythema, or rash of the labia. No erythema, bleeding, or lesions noted in the vaginal vault.  Small amount of white discharge noted.  No CMT tenderness, bleeding or friability. No adnexal tenderness, mass or fullness bilaterally. No inguinal adenopathy or hernia.    Musculoskeletal: Normal range of motion. She exhibits no tenderness.  Lymphadenopathy:    She has no cervical adenopathy.  Neurological: She is alert and oriented to person, place, and time.  Skin: Skin is warm and dry. Capillary refill takes less than 2 seconds.  Psychiatric: Her behavior is normal. Judgment and thought content normal.  Nursing note and vitals reviewed.    ED Treatments / Results  Labs (all labs ordered are listed, but only abnormal results are displayed) Labs Reviewed  WET PREP, GENITAL - Abnormal; Notable for the following components:      Result Value   Clue Cells Wet Prep HPF POC PRESENT (*)    WBC, Wet Prep HPF POC MODERATE (*)    All other components within normal limits  CBC - Abnormal; Notable for the following components:   Hemoglobin 11.5 (*)    HCT 34.9 (*)    All other components within normal limits  URINALYSIS, ROUTINE W REFLEX MICROSCOPIC - Abnormal; Notable for the following components:   APPearance CLOUDY (*)    All other components within normal limits  LIPASE, BLOOD  COMPREHENSIVE METABOLIC PANEL  RPR  HIV ANTIBODY (ROUTINE TESTING)  I-STAT BETA HCG BLOOD, ED ([facility], [facility], [facility] ONLY)    GC/CHLAMYDIA PROBE AMP ([place]) NOT AT [facility]    EKG None  Radiology US Transvaginal Non-ob  Result Date: 01/04/2018 CLINICAL DATA:  Left lower quadrant pain with vaginal discharge times 2-3 days. EXAM: TRANSABDOMINAL AND TRANSVAGINAL ULTRASOUND OF PELVIS DOPPLER ULTRASOUND OF OVARIES TECHNIQUE: Both transabdominal and transvaginal ultrasound examinations of the pelvis were performed. Transabdominal technique was performed for global imaging of the pelvis including uterus, ovaries, adnexal regions, and pelvic cul-de-sac. It was necessary to proceed with endovaginal exam following the transabdominal exam to visualize the endometrium and both ovaries. Color and duplex Doppler ultrasound was utilized to evaluate blood flow to the ovaries. COMPARISON:  None. FINDINGS: Uterus Measurements: 6.6 x 3.3 x 4.7 cm. Uterus is anteverted. No fibroids or other mass visualized. Endometrium Thickness: 1.5 cm.  No focal abnormality visualized. Right ovary Measurements: 3.3 x 2.1 x 2.1 cm. Normal appearance/no adnexal mass. Left ovary Measurements: 3.5 x 2.5 x 2.8 cm. Normal appearance/no adnexal mass. There appears  be an involuting follicle or cyst measuring 1.9 x 1.5 x 1.9 cm within the left ovary. Pulsed Doppler evaluation of both ovaries demonstrates normal low-resistance arterial and venous waveforms. Other findings Small amount of free fluid is noted within the left pelvis. IMPRESSION: 1. There appears to be an involuting cyst or follicle measuring 1.9 x 1.5 x 1.9 cm with small amount of pelvic fluid in the left adnexa that may explain the patient's left-sided pain. 2. No ovarian torsion. Electronically Signed   By: Tollie Eth M.D.   On: 01/04/2018 00:07   US Pelvis Complete  Result Date: 01/04/2018 CLINICAL DATA:  Left lower quadrant pain with vaginal discharge times 2-3 days. EXAM: TRANSABDOMINAL AND TRANSVAGINAL ULTRASOUND OF PELVIS DOPPLER ULTRASOUND OF OVARIES TECHNIQUE: Both transabdominal and  transvaginal ultrasound examinations of the pelvis were performed. Transabdominal technique was performed for global imaging of the pelvis including uterus, ovaries, adnexal regions, and pelvic cul-de-sac. It was necessary to proceed with endovaginal exam following the transabdominal exam to visualize the endometrium and both ovaries. Color and duplex Doppler ultrasound was utilized to evaluate blood flow to the ovaries. COMPARISON:  None. FINDINGS: Uterus Measurements: 6.6 x 3.3 x 4.7 cm. Uterus is anteverted. No fibroids or other mass visualized. Endometrium Thickness: 1.5 cm.  No focal abnormality visualized. Right ovary Measurements: 3.3 x 2.1 x 2.1 cm. Normal appearance/no adnexal mass. Left ovary Measurements: 3.5 x 2.5 x 2.8 cm. Normal appearance/no adnexal mass. There appears be an involuting follicle or cyst measuring 1.9 x 1.5 x 1.9 cm within the left ovary. Pulsed Doppler evaluation of both ovaries demonstrates normal low-resistance arterial and venous waveforms. Other findings Small amount of free fluid is noted within the left pelvis. IMPRESSION: 1. There appears to be an involuting cyst or follicle measuring 1.9 x 1.5 x 1.9 cm with small amount of pelvic fluid in the left adnexa that may explain the patient's left-sided pain. 2. No ovarian torsion. Electronically Signed   By: Tollie Eth M.D.   On: 01/04/2018 00:07   [place] Art/ven Flow Abd Pelv Doppler  Result Date: 01/04/2018 CLINICAL DATA:  Left lower quadrant pain with vaginal discharge times 2-3 days. EXAM: TRANSABDOMINAL AND TRANSVAGINAL ULTRASOUND OF PELVIS DOPPLER ULTRASOUND OF OVARIES TECHNIQUE: Both transabdominal and transvaginal ultrasound examinations of the pelvis were performed. Transabdominal technique was performed for global imaging of the pelvis including uterus, ovaries, adnexal regions, and pelvic cul-de-sac. It was necessary to proceed with endovaginal exam following the transabdominal exam to visualize the endometrium and both  ovaries. Color and duplex Doppler ultrasound was utilized to evaluate blood flow to the ovaries. COMPARISON:  None. FINDINGS: Uterus Measurements: 6.6 x 3.3 x 4.7 cm. Uterus is anteverted. No fibroids or other mass visualized. Endometrium Thickness: 1.5 cm.  No focal abnormality visualized. Right ovary Measurements: 3.3 x 2.1 x 2.1 cm. Normal appearance/no adnexal mass. Left ovary Measurements: 3.5 x 2.5 x 2.8 cm. Normal appearance/no adnexal mass. There appears be an involuting follicle or cyst measuring 1.9 x 1.5 x 1.9 cm within the left ovary. Pulsed Doppler evaluation of both ovaries demonstrates normal low-resistance arterial and venous waveforms. Other findings Small amount of free fluid is noted within the left pelvis. IMPRESSION: 1. There appears to be an involuting cyst or follicle measuring 1.9 x 1.5 x 1.9 cm with small amount of pelvic fluid in the left adnexa that may explain the patient's left-sided pain. 2. No ovarian torsion. Electronically Signed   By: Tollie Eth M.D.   On: 01/04/2018  00:07    Procedures Procedures (including critical care time)  Medications Ordered in ED Medications  cefTRIAXone (ROCEPHIN) injection 250 mg (250 mg Intramuscular Given 01/04/18 0039)  azithromycin (ZITHROMAX) tablet 1,000 mg (1,000 mg Oral Given 01/04/18 0039)  sterile water (preservative free) injection (10 mLs  Given 01/04/18 0040)     Initial Impression / Assessment and Plan / ED Course  I have reviewed the triage vital signs and the nursing notes.  Pertinent labs & imaging results that were available during my care of the patient were reviewed by me and considered in my medical decision making (see chart for details).     Patient presents to the ED for evaluation of left lower quadrant abdominal pain and vaginal discharge.  Ongoing for 2 to 3 days.  Patient is sexually active.  Vital signs reassuring.  Patient afebrile.  Heart regular rate and rhythm.  Lungs clear to auscultation  bilaterally.  Mild left lower quadrant abdominal pain to deep palpation.  No signs of peritonitis.  Bowel sounds present in all 4 quadrants.  No CVA tenderness.  No cervical motion tenderness or adnexal tenderness.  Small amount of vaginal discharge noted on exam.  Lab work shows no leukocytosis.  Hemoglobin appears the patient's baseline.  Normal electrolytes and kidney function.  Normal liver enzymes.  GC, chlamydia, RPR and HIV are pending.  Wet prep shows clue cells and WBCs.  Ultrasound shows left ovarian cyst without any signs of torsion.  No abscess formation.  Given the patient has no cervical motion tenderness or adnexal tenderness doubt PID.  Will treat with Rocephin and azithromycin for STD empirically.  I suspect the patient's pain is coming from left ovarian cyst.  This will be followed up with her primary care doctor if not improved and instructed to return to the ED with any worsening symptoms.  Will treat with Flagyl for bacterial vaginosis.  Again patient has no signs of peritonitis on exam.  Doubt appendicitis.  Doubt diverticulitis.  UA shows no signs of infection.  Doubt UTI or pyelonephritis.  Doubt pancreatitis or cholecystitis.  No signs of bowel obstruction.  Abdominal exam was benign.  Patient denies any abdominal pain.  Tolerating p.o. fluids appropriately.   Pt is hemodynamically stable, in NAD, & able to ambulate in the ED. Evaluation does not show pathology that would require ongoing emergent intervention or inpatient treatment. I explained the diagnosis to the patient. Pain has been managed & has no complaints prior to dc. Pt is comfortable with above plan and is stable for discharge at this time. All questions were answered prior to disposition. Strict return precautions for f/u to the ED were discussed. Encouraged follow up with PCP.    Final Clinical Impressions(s) / ED Diagnoses   Final diagnoses:  Vaginal discharge  LLQ abdominal pain    ED Discharge Orders          Ordered    metroNIDAZOLE (FLAGYL) 500 MG tablet  2 times daily with meals     01/04/18 0026       Rise Mu, PA-C 01/04/18 0140    Nira Conn, MD 01/06/18 2313

## 2018-01-06 LAB — GC/CHLAMYDIA PROBE AMP (~~LOC~~) NOT AT ARMC
Chlamydia: NEGATIVE
Neisseria Gonorrhea: NEGATIVE

## 2018-01-16 ENCOUNTER — Emergency Department (HOSPITAL_COMMUNITY)
Admission: EM | Admit: 2018-01-16 | Discharge: 2018-01-16 | Disposition: A | Discharge status: Home / Self Care | Payer: Medicaid Other | Attending: Emergency Medicine | Admitting: Emergency Medicine

## 2018-01-16 ENCOUNTER — Encounter (HOSPITAL_COMMUNITY): Payer: Self-pay

## 2018-01-16 DIAGNOSIS — Z202 Contact with and (suspected) exposure to infections with a predominantly sexual mode of transmission: Secondary | ICD-10-CM

## 2018-01-16 DIAGNOSIS — J45909 Unspecified asthma, uncomplicated: Secondary | ICD-10-CM | POA: Insufficient documentation

## 2018-01-16 LAB — WET PREP, GENITAL
Clue Cells Wet Prep HPF POC: NONE SEEN
Sperm: NONE SEEN
Trich, Wet Prep: NONE SEEN
Yeast Wet Prep HPF POC: NONE SEEN

## 2018-01-16 NOTE — Discharge Instructions (Addendum)
Please read attached information. If you experience any new or worsening signs or symptoms please return to the emergency room for evaluation. Please follow-up with your primary care provider or specialist as discussed.  °

## 2018-01-16 NOTE — ED Notes (Signed)
Pelvic cart set up at bedside  

## 2018-01-16 NOTE — ED Provider Notes (Signed)
[facility] EMERGENCY DEPARTMENT Provider Note   CSN: [id number] Arrival date & time: 01/16/18  1628     History   Chief Complaint No chief complaint on file.   HPI Melissa [name] is a 22 y.o. female.  HPI    22 year old female presents today with complaints of STD exposure.  Patient notes she has a new female sexual partner who reports that he has an STD, she is uncertain what STD this is.  She denies any vaginal discharge or vaginal pain.  She has no other complaints here today.  Past Medical History:  Diagnosis Date  . Asthma   . Bipolar 1 disorder (HCC)   . Depression     Patient Active Problem List   Diagnosis Date Noted  . Schizoaffective disorder, depressive type (HCC) 07/19/2016    History reviewed. No pertinent surgical history.   OB History   None      Home Medications    Prior to Admission medications   Medication Sig Start Date End Date Taking? Authorizing Provider  ARIPiprazole (ABILIFY) 30 MG tablet Take 1 tablet (30 mg total) by mouth daily. Patient not taking: Reported on 06/16/2017 08/07/16   Pucilowska, Braulio Conte B, MD  ARIPiprazole ER 400 MG SRER Inject 400 mg into the muscle every 30 (thirty) days. Next injectio on 09/01/2015 Patient taking differently: Inject 400 mg into the muscle every 21 ( twenty-one) days.  09/02/16   Pucilowska, Braulio Conte B, MD  diphenhydrAMINE (BENADRYL) 25 mg capsule Take 1 capsule (25 mg total) by mouth at bedtime. Patient not taking: Reported on 06/16/2017 08/06/16   Pucilowska, Ellin Goodie, MD  hydrOXYzine (ATARAX/VISTARIL) 50 MG tablet Take 1 tablet (50 mg total) by mouth every 6 (six) hours as needed for anxiety. Patient not taking: Reported on 06/16/2017 08/06/16   Pucilowska, Ellin Goodie, MD  metroNIDAZOLE (FLAGYL) 500 MG tablet Take 1 tablet (500 mg total) by mouth 2 (two) times daily with a meal. DO NOT CONSUME ALCOHOL WHILE TAKING THIS MEDICATION. 01/04/18   Rise Mu, PA-C  sertraline  (ZOLOFT) 50 MG tablet Take 1 tablet (50 mg total) by mouth at bedtime. Patient not taking: Reported on 06/16/2017 08/06/16   Shari Prows, MD    Family History No family history on file.  Social History Social History   Tobacco Use  . Smoking status: Never Smoker  . Smokeless tobacco: Never Used  Substance Use Topics  . Alcohol use: No  . Drug use: No     Allergies   Patient has no known allergies.   Review of Systems Review of Systems  All other systems reviewed and are negative.    Physical Exam Updated Vital Signs BP 106/62   Pulse 88   Temp 98.8 F (37.1 C) (Oral)   Resp 18   SpO2 99%   Physical Exam  Constitutional: She is oriented to person, place, and time. She appears well-developed and well-nourished.  HENT:  Head: Normocephalic and atraumatic.  Eyes: Pupils are equal, round, and reactive to light. Conjunctivae are normal. Right eye exhibits no discharge. Left eye exhibits no discharge. No scleral icterus.  Neck: Normal range of motion. No JVD present. No tracheal deviation present.  Pulmonary/Chest: Effort normal. No stridor.  Abdominal: Soft. She exhibits no distension and no mass. There is no tenderness. There is no rebound and no guarding. No hernia.  Genitourinary:  Genitourinary Comments: No vaginal discharge noted, no cervical motion tenderness, adnexal tenderness or masses  Neurological: She is  alert and oriented to person, place, and time. Coordination normal.  Psychiatric: She has a normal mood and affect. Her behavior is normal. Judgment and thought content normal.  Nursing note and vitals reviewed.    ED Treatments / Results  Labs (all labs ordered are listed, but only abnormal results are displayed) Labs Reviewed  WET PREP, GENITAL - Abnormal; Notable for the following components:      Result Value   WBC, Wet Prep HPF POC FEW (*)    All other components within normal limits  GC/CHLAMYDIA PROBE AMP ([place]) NOT AT [facility]      EKG None  Radiology No results found.  Procedures Procedures (including critical care time)  Medications Ordered in ED Medications - No data to display   Initial Impression / Assessment and Plan / ED Course  I have reviewed the triage vital signs and the nursing notes.  Pertinent labs & imaging results that were available during my care of the patient were reviewed by me and considered in my medical decision making (see chart for details).     Labs: wet prep  Imaging:  Consults:  Therapeutics:  Discharge Meds:   Assessment/Plan: 22 year old female presents today with complaints of STD exposure.  Patient has been seen numerous times in the emergency room with complaints of STDs, chart review shows no significant STD infections recently.  She has no complaints including vaginal discharge, none noted on exam.  I do not feel patient would benefit from treatment at this time.  Patient discharged with return precautions and follow-up information.  Verbalized understanding and agreement to today's plan had no further questions or concerns.     Final Clinical Impressions(s) / ED Diagnoses   Final diagnoses:  STD exposure    ED Discharge Orders    None       Rosalio Loud 01/16/18 1850    Charlynne Pander, MD 01/16/18 2206

## 2018-01-16 NOTE — ED Triage Notes (Signed)
Patient here requesting STD check, no symptoms. States that she was told she may have been exposed

## 2018-01-17 LAB — GC/CHLAMYDIA PROBE AMP (~~LOC~~) NOT AT ARMC
Chlamydia: NEGATIVE
Neisseria Gonorrhea: NEGATIVE

## 2018-03-13 ENCOUNTER — Emergency Department (HOSPITAL_COMMUNITY)
Admission: EM | Admit: 2018-03-13 | Discharge: 2018-03-13 | Disposition: A | Discharge status: Home / Self Care | Payer: Medicaid Other | Attending: Emergency Medicine | Admitting: Emergency Medicine

## 2018-03-13 ENCOUNTER — Encounter (HOSPITAL_COMMUNITY): Payer: Self-pay

## 2018-03-13 ENCOUNTER — Other Ambulatory Visit: Payer: Self-pay

## 2018-03-13 DIAGNOSIS — B9689 Other specified bacterial agents as the cause of diseases classified elsewhere: Secondary | ICD-10-CM | POA: Insufficient documentation

## 2018-03-13 DIAGNOSIS — J302 Other seasonal allergic rhinitis: Secondary | ICD-10-CM | POA: Diagnosis not present

## 2018-03-13 DIAGNOSIS — J45909 Unspecified asthma, uncomplicated: Secondary | ICD-10-CM | POA: Insufficient documentation

## 2018-03-13 DIAGNOSIS — N760 Acute vaginitis: Secondary | ICD-10-CM | POA: Diagnosis not present

## 2018-03-13 DIAGNOSIS — Z202 Contact with and (suspected) exposure to infections with a predominantly sexual mode of transmission: Secondary | ICD-10-CM | POA: Diagnosis not present

## 2018-03-13 DIAGNOSIS — N898 Other specified noninflammatory disorders of vagina: Secondary | ICD-10-CM | POA: Diagnosis present

## 2018-03-13 LAB — WET PREP, GENITAL
Sperm: NONE SEEN
Trich, Wet Prep: NONE SEEN
Yeast Wet Prep HPF POC: NONE SEEN

## 2018-03-13 LAB — POC URINE PREG, ED: Preg Test, Ur: NEGATIVE

## 2018-03-13 MED ORDER — METRONIDAZOLE 500 MG PO TABS: 500.0000 mg | ORAL_TABLET | Freq: Two times a day (BID) | ORAL | 0 refills | Status: AC

## 2018-03-13 NOTE — Discharge Instructions (Addendum)
Take Zyrtec daily as directed. Take Flagyl as directed for bacterial vaginosis and complete the full course.  Not taking this medication Your wet prep was positive for bacterial vaginosis today, will be treated for that.  His contact hospital in 3 to 5 days for urine test, further treatment can be offered at that time if needed.  Follow-up with further testing as needed.

## 2018-03-13 NOTE — ED Notes (Signed)
Pelvic exam done by Vernona RiegerLaura - PA and Kenney Housemananya - EMT assisted.

## 2018-03-13 NOTE — ED Provider Notes (Signed)
[facility] EMERGENCY DEPARTMENT Provider Note   CSN: [id number] Arrival date & time: 03/13/18  1146     History   Chief Complaint Chief Complaint  Patient presents with  . Eye Problem  . Exposure to STD    HPI Melissa [name] is a 22 y.o. female.  22 year old female presents with complaint of eyelid itching as well as vaginal discharge.  Patient states her eyelids have been itching for a few weeks, tried OTC drops without relief, denies eye redness or drainage.  Patient is also here for yellow vaginal discharge with odor x1 month, concern for STD.  Patient states she has had trichomonas as well as chlamydia in the past and believes she has chlamydia again.  Denies fevers, vomiting, abdominal pain.  Patient would also like a pregnancy test, last menstrual cycle February 27, 2018.  No other complaints or concerns.     Past Medical History:  Diagnosis Date  . Asthma   . Bipolar 1 disorder (HCC)   . Depression     Patient Active Problem List   Diagnosis Date Noted  . Schizoaffective disorder, depressive type (HCC) 07/19/2016    History reviewed. No pertinent surgical history.   OB History   None      Home Medications    Prior to Admission medications   Medication Sig Start Date End Date Taking? Authorizing Provider  metroNIDAZOLE (FLAGYL) 500 MG tablet Take 1 tablet (500 mg total) by mouth 2 (two) times daily with a meal for 7 days. DO NOT CONSUME ALCOHOL WHILE TAKING THIS MEDICATION. 03/13/18 03/20/18  Jeannie Fend, PA-C    Family History No family history on file.  Social History Social History   Tobacco Use  . Smoking status: Never Smoker  . Smokeless tobacco: Never Used  Substance Use Topics  . Alcohol use: No  . Drug use: No     Allergies   Patient has no known allergies.   Review of Systems Review of Systems  Constitutional: Negative for chills and fever.  Gastrointestinal: Negative for abdominal pain, constipation,  diarrhea, nausea and vomiting.  Genitourinary: Positive for vaginal discharge. Negative for decreased urine volume, dysuria, frequency, pelvic pain, urgency, vaginal bleeding and vaginal pain.  Musculoskeletal: Negative for back pain.  Skin: Negative for rash and wound.  Allergic/Immunologic: Negative for immunocompromised state.  Neurological: Negative for headaches.  Hematological: Negative for adenopathy. Does not bruise/bleed easily.  Psychiatric/Behavioral: Negative for confusion.  All other systems reviewed and are negative.    Physical Exam Updated Vital Signs BP 116/78 (BP Location: Right Arm)   Pulse 88   Temp 97.6 F (36.4 C) (Oral)   Resp 18   Ht 5\' 8"  (1.727 m)   Wt 72.6 kg (160 lb)   LMP 02/27/2018   SpO2 100%   BMI 24.33 kg/m   Physical Exam  Constitutional: She is oriented to person, place, and time. She appears well-developed and well-nourished. No distress.  HENT:  Head: Normocephalic and atraumatic.  Eyes: Pupils are equal, round, and reactive to light. Conjunctivae and EOM are normal. Right eye exhibits no discharge. Left eye exhibits no discharge. No scleral icterus.  Neck: Neck supple.  Cardiovascular: Normal rate, regular rhythm, normal heart sounds and intact distal pulses.  No murmur heard. Pulmonary/Chest: Effort normal and breath sounds normal. No respiratory distress.  Abdominal: Bowel sounds are normal. She exhibits no distension. There is no tenderness.  Genitourinary: Uterus normal. Uterus is not enlarged and not tender. Cervix exhibits  no motion tenderness, no discharge and no friability. Right adnexum displays no mass, no tenderness and no fullness. Left adnexum displays no mass, no tenderness and no fullness. No erythema, tenderness or bleeding in the vagina. No foreign body in the vagina. Vaginal discharge found.  Genitourinary Comments: CNA, EMT assisted with exam. Mild amount of clear discharge present, no adnexal pain or fullness, no CMT    Lymphadenopathy:    She has no cervical adenopathy.  Neurological: She is alert and oriented to person, place, and time.  Skin: Skin is warm and dry. No rash noted. She is not diaphoretic.  Psychiatric: She has a normal mood and affect. Her behavior is normal.  Nursing note and vitals reviewed.    ED Treatments / Results  Labs (all labs ordered are listed, but only abnormal results are displayed) Labs Reviewed  WET PREP, GENITAL - Abnormal; Notable for the following components:      Result Value   Clue Cells Wet Prep HPF POC PRESENT (*)    WBC, Wet Prep HPF POC MANY (*)    All other components within normal limits  POC URINE PREG, ED  GC/CHLAMYDIA PROBE AMP (Protivin) [facility]    EKG None  Radiology No results found.  Procedures Procedures (including critical care time)  Medications Ordered in ED Medications - No data to display   Initial Impression / Assessment and Plan / ED Course  I have reviewed the triage vital signs and the nursing notes.  Pertinent labs & imaging results that were available during my care of the patient were reviewed by me and considered in my medical decision making (see chart for details).  Clinical Course as of Mar 13 1438  Thu Mar 13, 2018  1357 5 previous gonorrhea/chlamydia tests this year have all been negative. Negative HIV and RPR in May 2019. Will defer treatment until tests result.    [LM]  [id number] 22 year old female with complaint of yellow vaginal discharge without odor.  Patient denies that she has allergies causing her eyes to itch, exam is unremarkable, recommend Zyrtec.  She reports yellow vaginal discharge with odor x1 month with history of previous STD media again.  On exam patient has a small amount of clear discharge, no pelvic tenderness.  She has been evaluated multiple times this emergency room for STD testing, she has had 5- gonorrhea chlamydia test this year.  Will defer treatment until her test results are available.   Given Flagyl to treat for bacterial vaginosis recommend she follow-up without   [LM]    Clinical Course User Index [LM] Jeannie FendMurphy, Cordarrel Stiefel A, PA-C    Final Clinical Impressions(s) / ED Diagnoses   Final diagnoses:  Bacterial vaginosis  Seasonal allergies    ED Discharge Orders        Ordered    metroNIDAZOLE (FLAGYL) 500 MG tablet  2 times daily with meals     03/13/18 1436       Jeannie FendMurphy, Kortney Potvin A, PA-C 03/13/18 1439    Loren RacerYelverton, David, MD 03/13/18 2138

## 2018-03-13 NOTE — ED Notes (Signed)
Patient verbalizes understanding of discharge instructions. Opportunity for questioning and answers were provided. Armband removed by staff, pt discharged from ED.  

## 2018-03-13 NOTE — ED Triage Notes (Signed)
Pt states that has been having drainage from her eyes with irritation X2 weeks. No drainage noted in triage. Denies blurred vision. Pt also reports that she had unprotected sex with someone that was positive for chlamydia. C/O abdominal cramping, discharge, and odor.

## 2018-03-14 LAB — GC/CHLAMYDIA PROBE AMP (~~LOC~~) NOT AT ARMC
Chlamydia: NEGATIVE
Neisseria Gonorrhea: NEGATIVE

## 2018-04-14 ENCOUNTER — Encounter (HOSPITAL_COMMUNITY): Payer: Self-pay | Admitting: Family Medicine

## 2018-04-14 ENCOUNTER — Other Ambulatory Visit: Payer: Self-pay

## 2018-04-14 ENCOUNTER — Ambulatory Visit (HOSPITAL_COMMUNITY)
Admission: EM | Admit: 2018-04-14 | Discharge: 2018-04-14 | Disposition: A | Discharge status: Home / Self Care | Payer: Medicaid Other | Attending: Family Medicine | Admitting: Family Medicine

## 2018-04-14 DIAGNOSIS — B379 Candidiasis, unspecified: Secondary | ICD-10-CM | POA: Diagnosis present

## 2018-04-14 DIAGNOSIS — J45909 Unspecified asthma, uncomplicated: Secondary | ICD-10-CM | POA: Diagnosis not present

## 2018-04-14 DIAGNOSIS — Z79899 Other long term (current) drug therapy: Secondary | ICD-10-CM | POA: Insufficient documentation

## 2018-04-14 DIAGNOSIS — F319 Bipolar disorder, unspecified: Secondary | ICD-10-CM | POA: Diagnosis not present

## 2018-04-14 DIAGNOSIS — F251 Schizoaffective disorder, depressive type: Secondary | ICD-10-CM | POA: Diagnosis not present

## 2018-04-14 MED ORDER — FLUCONAZOLE 150 MG PO TABS: 150.0000 mg | ORAL_TABLET | Freq: Once | ORAL | 0 refills | Status: AC

## 2018-04-14 NOTE — ED Triage Notes (Signed)
Groin and inner thigh muscles twitching for 2 weeks  Std concerns.  Patient has an odor x 2 days.  Patient describes "COTTAGE CHEESE" discharge for a month

## 2018-04-14 NOTE — ED Notes (Signed)
Patient provided urine.  Labeled specimen and left in lab

## 2018-04-14 NOTE — ED Provider Notes (Signed)
[facility]    CSN: [id number] Arrival date & time: 04/14/18  1339     History   Chief Complaint Chief Complaint  Patient presents with  . SEXUALLY TRANSMITTED DISEASE    HPI Melissa [name] is a 22 y.o. female.   Groin and inner thigh muscles twitching for 2 weeks   Std concerns.  Patient has an odor x 2 days.  Patient describes "COTTAGE CHEESE" discharge for a month.  The labia are very itchy  Patient's last menstrual period was July 31.  She has not had intercourse since her last visit to the emergency room exactly 1 month ago when she tested negative for gonorrhea and chlamydia.    Patient was treated for chlamydia and vomited the medicine back.  She also took some other antibiotics.     Past Medical History:  Diagnosis Date  . Asthma   . Bipolar 1 disorder (HCC)   . Depression     Patient Active Problem List   Diagnosis Date Noted  . Schizoaffective disorder, depressive type (HCC) 07/19/2016    History reviewed. No pertinent surgical history.  OB History   None      Home Medications    Prior to Admission medications   Medication Sig Start Date End Date Taking? Authorizing Provider  ARIPiprazole (ABILIFY IM) Inject into the muscle.   Yes [provider]  fluconazole (DIFLUCAN) 150 MG tablet Take 1 tablet (150 mg total) by mouth once for 1 dose. Repeat if needed 04/14/18 04/14/18  Elvina SidleLauenstein, Kinzy Weyers, MD    Family History History reviewed. No pertinent family history.  Social History Social History   Tobacco Use  . Smoking status: Never Smoker  . Smokeless tobacco: Never Used  Substance Use Topics  . Alcohol use: No  . Drug use: No     Allergies   Patient has no known allergies.   Review of Systems Review of Systems   Physical Exam Triage Vital Signs ED Triage Vitals [04/14/18 1419]  Enc Vitals Group     BP      Pulse      Resp      Temp      Temp src      SpO2      Weight      Height      Head  Circumference      Peak Flow      Pain Score 7     Pain Loc      Pain Edu?      Excl. in GC?    No data found.  Updated Vital Signs BP 110/76 (BP Location: Left Arm)   Pulse 100   Temp 98.4 F (36.9 C) (Oral)   Resp 18   LMP 03/26/2018   SpO2 100%   Visual Acuity Right Eye Distance:   Left Eye Distance:   Bilateral Distance:    Right Eye Near:   Left Eye Near:    Bilateral Near:     Physical Exam  Constitutional: She is oriented to person, place, and time. She appears well-developed and well-nourished.  HENT:  Head: Normocephalic.  Right Ear: External ear normal.  Left Ear: External ear normal.  Mouth/Throat: Oropharynx is clear and moist.  Eyes: Pupils are equal, round, and reactive to light. Conjunctivae are normal.  Neck: Normal range of motion. Neck supple.  Pulmonary/Chest: Effort normal.  Abdominal: Soft. Bowel sounds are normal.  Musculoskeletal: Normal range of motion.  Neurological: She is alert and  oriented to person, place, and time.  Skin: Skin is warm and dry.  Nursing note and vitals reviewed.    UC Treatments / Results  Labs (all labs ordered are listed, but only abnormal results are displayed) Labs Reviewed  URINE CYTOLOGY ANCILLARY ONLY    EKG None  Radiology No results found.  Procedures Procedures (including critical care time)  Medications Ordered in UC Medications - No data to display  Initial Impression / Assessment and Plan / UC Course  I have reviewed the triage vital signs and the nursing notes.  Pertinent labs & imaging results that were available during my care of the patient were reviewed by me and considered in my medical decision making (see chart for details).    Final Clinical Impressions(s) / UC Diagnoses   Final diagnoses:  Monilia infection   Discharge Instructions   None    ED Prescriptions    Medication Sig Dispense Auth. Provider   fluconazole (DIFLUCAN) 150 MG tablet Take 1 tablet (150 mg total) by  mouth once for 1 dose. Repeat if needed 2 tablet Elvina SidleLauenstein, Qiara Minetti, MD     Controlled Substance Prescriptions [place] Controlled Substance Registry consulted? Not Applicable   Elvina SidleLauenstein, Brodrick Curran, MD 04/14/18 1430

## 2018-04-16 LAB — URINE CYTOLOGY ANCILLARY ONLY: Candida vaginitis: NEGATIVE

## 2018-05-30 ENCOUNTER — Ambulatory Visit (INDEPENDENT_AMBULATORY_CARE_PROVIDER_SITE_OTHER): Payer: Medicaid Other | Admitting: Obstetrics and Gynecology

## 2018-05-30 ENCOUNTER — Other Ambulatory Visit (HOSPITAL_COMMUNITY)
Admission: RE | Admit: 2018-05-30 | Discharge: 2018-05-30 | Disposition: A | Discharge status: Home / Self Care | Payer: Medicaid Other | Source: Ambulatory Visit | Attending: Obstetrics and Gynecology | Admitting: Obstetrics and Gynecology

## 2018-05-30 ENCOUNTER — Encounter: Payer: Self-pay | Admitting: Obstetrics and Gynecology

## 2018-05-30 VITALS — BP 98/59 | HR 51 | Ht 68.0 in | Wt 160.0 lb

## 2018-05-30 DIAGNOSIS — Z8742 Personal history of other diseases of the female genital tract: Secondary | ICD-10-CM

## 2018-05-30 DIAGNOSIS — Z124 Encounter for screening for malignant neoplasm of cervix: Secondary | ICD-10-CM | POA: Diagnosis present

## 2018-05-30 NOTE — Progress Notes (Signed)
Obstetrics and Gynecology New Patient Evaluation  Appointment Date: 05/30/2018  OBGYN Clinic: [facility]  Referring Provider: [name]  Chief Complaint: h/o LLQ pain and ovarian cyst  History of Present Illness: Melissa [name] is a 22 y.o. African-American [id number] (Patient's last menstrual period was 05/15/2018 (exact date).), seen for the above chief complaint. Patient seen on 5/10 in the [facility] ED for LLQ and suprapubic pain which she states had been on going for several weeks prior to presenting. She denies any time of similar pain in the past. She had negative wet prep, gc/ct, hiv, rpr, cbc, beta hcg, cmp, lipase. They told her she had a cyst on u/s and to follow up with GYN; they also gave her azithro and rocephin in the ED which she states helped with her pain and it went away with no further s/s. She denies a h/o ovarian cysts in the past.    No breast s/s, fevers, chills, chest pain, SOB, nausea, vomiting, abdominal pain, dysuria, hematuria, vaginal itching, dyspareunia, diarrhea, constipation, blood in BMs  Review of Systems: as noted in the History of Present Illness.   Past Medical History:  Past Medical History:  Diagnosis Date  . Asthma   . Bipolar 1 disorder (HCC)   . Depression     Past Surgical History:  Past Surgical History:  Procedure Laterality Date  . NO PAST SURGERIES      Past Obstetrical History:  OB History  Gravida Para Term Preterm AB Living  1 0 0 0 1 0  SAB TAB Ectopic Multiple Live Births  1 0 0 0 0    # Outcome Date GA Lbr Len/2nd Weight Sex Delivery Anes PTL Lv  1 SAB 2014 [redacted]w[redacted]d           Past Gynecological History: As per HPI. Periods: qmonth, regular, less than a week, no heavy or painful History of Pap Smear(s): No.  History of STI(s): Yes.   She is currently using condoms for contraception.   Social History:  Social History   Socioeconomic History  . Marital status: Single    Spouse name: Not on file  .  Number of children: Not on file  . Years of education: Not on file  . Highest education level: Not on file  Occupational History  . Not on file  Social Needs  . Financial resource strain: Not on file  . Food insecurity:    Worry: Not on file    Inability: Not on file  . Transportation needs:    Medical: Not on file    Non-medical: Not on file  Tobacco Use  . Smoking status: Never Smoker  . Smokeless tobacco: Never Used  Substance and Sexual Activity  . Alcohol use: No  . Drug use: No  . Sexual activity: Not on file  Lifestyle  . Physical activity:    Days per week: Not on file    Minutes per session: Not on file  . Stress: Not on file  Relationships  . Social connections:    Talks on phone: Not on file    Gets together: Not on file    Attends religious service: Not on file    Active member of club or organization: Not on file    Attends meetings of clubs or organizations: Not on file    Relationship status: Not on file  . Intimate partner violence:    Fear of current or ex partner: Not on file    Emotionally  abused: Not on file    Physically abused: Not on file    Forced sexual activity: Not on file  Other Topics Concern  . Not on file  Social History Narrative  . Not on file    Family History: History reviewed. No pertinent family history. She denies any female cancers, bleeding or blood clotting disorders.   Medications Melissa [name] had no medications administered during this visit. Current Outpatient Medications  Medication Sig Dispense Refill  . ARIPiprazole (ABILIFY IM) Inject into the muscle.     No current facility-administered medications for this visit.     Allergies Patient has no known allergies.   Physical Exam:  BP (!) 98/59   Pulse (!) 51   Ht 5\' 8"  (1.727 m)   Wt 160 lb (72.6 kg)   LMP 05/15/2018 (Exact Date)   BMI 24.33 kg/m  Body mass index is 24.33 kg/m. General appearance: Well nourished, well developed female in no acute  distress.  Neck:  Supple, normal appearance, and no thyromegaly  Cardiovascular: normal s1 and s2.  No murmurs, rubs or gallops.HR 60s Respiratory:  Clear to auscultation bilateral. Normal respiratory effort Abdomen: positive bowel sounds and no masses, hernias; diffusely non tender to palpation, non distended Neuro/Psych:  Normal mood and affect.  Skin:  Warm and dry.  Lymphatic:  No inguinal lymphadenopathy.   Pelvic exam: is not limited by body habitus EGBUS: within normal limits, Vagina: within normal limits and with no blood or discharge in the vault, Cervix: normal appearing cervix without tenderness, discharge or lesions. Uterus:  nonenlarged and non tender and Adnexa:  normal adnexa and no mass, fullness, tenderness Rectovaginal: deferred  Laboratory: none  Radiology:  EXAM: TRANSABDOMINAL AND TRANSVAGINAL ULTRASOUND OF PELVIS  DOPPLER ULTRASOUND OF OVARIES  TECHNIQUE: Both transabdominal and transvaginal ultrasound examinations of the pelvis were performed. Transabdominal technique was performed for global imaging of the pelvis including uterus, ovaries, adnexal regions, and pelvic cul-de-sac.  It was necessary to proceed with endovaginal exam following the transabdominal exam to visualize the endometrium and both ovaries. Color and duplex Doppler ultrasound was utilized to evaluate blood flow to the ovaries.  COMPARISON:  None.  FINDINGS: Uterus  Measurements: 6.6 x 3.3 x 4.7 cm. Uterus is anteverted. No fibroids or other mass visualized.  Endometrium  Thickness: 1.5 cm.  No focal abnormality visualized.  Right ovary  Measurements: 3.3 x 2.1 x 2.1 cm. Normal appearance/no adnexal mass.  Left ovary  Measurements: 3.5 x 2.5 x 2.8 cm. Normal appearance/no adnexal mass. There appears be an involuting follicle or cyst measuring 1.9 x 1.5 x 1.9 cm within the left ovary.  Pulsed Doppler evaluation of both ovaries demonstrates  normal low-resistance arterial and venous waveforms.  Other findings  Small amount of free fluid is noted within the left pelvis.  IMPRESSION: 1. There appears to be an involuting cyst or follicle measuring 1.9 x 1.5 x 1.9 cm with small amount of pelvic fluid in the left adnexa that may explain the patient's left-sided pain. 2. No ovarian torsion.   Electronically Signed   By: Tollie Eth M.D.   On: 01/04/2018 00:07  Assessment: pt doing well  Plan:  1. Cervical cancer screening Pt amenable to pap today - Cytology - PAP  2. History of ovarian cyst I told her that it's unlikely that what they saw on u/s was causing her pain, given the size, which I told her is really too small to be considered a cyst, and that  it went away with abx.  Birth control options d/w her and she denies anything except for condoms currently. Plan B advised   RTC PRN  Cornelia Copa MD Attending Center for [facility])

## 2018-06-02 ENCOUNTER — Encounter: Payer: Self-pay | Admitting: Obstetrics and Gynecology

## 2018-06-02 LAB — CYTOLOGY - PAP: Diagnosis: NEGATIVE

## 2018-09-05 ENCOUNTER — Encounter (HOSPITAL_COMMUNITY): Payer: Self-pay | Admitting: Student

## 2018-09-05 ENCOUNTER — Emergency Department (HOSPITAL_COMMUNITY)
Admission: EM | Admit: 2018-09-05 | Discharge: 2018-09-05 | Disposition: A | Discharge status: Home / Self Care | Payer: Medicaid Other | Attending: Emergency Medicine | Admitting: Emergency Medicine

## 2018-09-05 DIAGNOSIS — R55 Syncope and collapse: Secondary | ICD-10-CM

## 2018-09-05 DIAGNOSIS — J45909 Unspecified asthma, uncomplicated: Secondary | ICD-10-CM | POA: Diagnosis not present

## 2018-09-05 DIAGNOSIS — Z113 Encounter for screening for infections with a predominantly sexual mode of transmission: Secondary | ICD-10-CM

## 2018-09-05 DIAGNOSIS — Z202 Contact with and (suspected) exposure to infections with a predominantly sexual mode of transmission: Secondary | ICD-10-CM | POA: Insufficient documentation

## 2018-09-05 LAB — WET PREP, GENITAL
Clue Cells Wet Prep HPF POC: NONE SEEN
Sperm: NONE SEEN
Trich, Wet Prep: NONE SEEN
Yeast Wet Prep HPF POC: NONE SEEN

## 2018-09-05 LAB — I-STAT BETA HCG BLOOD, ED (MC, WL, AP ONLY): I-stat hCG, quantitative: <5 m[IU]/mL (ref <5)

## 2018-09-05 LAB — I-STAT CHEM 8, ED
BUN: 11 mg/dL (ref 6–20)
Calcium, Ion: 1.24 mmol/L (ref 1.15–1.40)
Chloride: 106 mmol/L (ref 98–111)
Creatinine, Ser: 1.1 mg/dL — ABNORMAL HIGH (ref 0.44–1.00)
Glucose, Bld: 95 mg/dL (ref 70–99)
HCT: 39 % (ref 36.0–46.0)
Hemoglobin: 13.3 g/dL (ref 12.0–15.0)
Potassium: 3.7 mmol/L (ref 3.5–5.1)
Sodium: 141 mmol/L (ref 135–145)
TCO2: 24 mmol/L (ref 22–32)

## 2018-09-05 LAB — URINALYSIS, ROUTINE W REFLEX MICROSCOPIC
Bilirubin Urine: NEGATIVE
Glucose, UA: NEGATIVE mg/dL
Ketones, ur: NEGATIVE mg/dL
Leukocytes, UA: NEGATIVE
Nitrite: NEGATIVE
Protein, ur: 30 mg/dL — AB
RBC / HPF: >50 RBC/hpf — ABNORMAL HIGH (ref 0–5)
Specific Gravity, Urine: 1.026 (ref 1.005–1.030)
pH: 5 (ref 5.0–8.0)

## 2018-09-05 NOTE — ED Triage Notes (Signed)
Pt c/o having recurrent blackouts since yesterday.  Has has dizziness spells for 2 years and would also like to get an STD check.

## 2018-09-05 NOTE — ED Notes (Signed)
Pt stable, ambulatory, states understanding of discharge instructions 

## 2018-09-05 NOTE — Discharge Instructions (Signed)
You were seen in the ER for passing out. Your work up here was reassuring. Your kidney function was mildly elevated- have this rechecked by primary care. Your urine also had some protein- have this rechecked by primary care.   Your EKG did not show concerning findings. Your other labs were fairly normal. We will call you if your STD testing is positive, if positive you will need to inform all sexual partners and seek treatment.   Please follow up with primary care within 1 week for re-evaluation. Avoid exertional or exercise type activities in the mean time. Return to the ER for new or worsening symptoms or any other concerns.

## 2018-09-05 NOTE — ED Provider Notes (Signed)
[facility]   CSN: [id number] Arrival date & time: 09/05/18  1307     History   Chief Complaint Chief Complaint  Patient presents with  . Loss of Consciousness  . Exposure to STD    HPI Melissa [name] is a 23 y.o. female with a hx of asthma, depression, and bipolar 1 disorder who presents to the ED with chief complaint of syncope yesterday. Patient states she was seated in the car talking with her boyfriend on the way to get a tattoo when she began to feel somewhat lightheaded, had palpitations, and "blacked out." she describes this as her head feeling heavy and her eyes being briefly closed, she states if she were standing she believes she would have fallen. She states her palpitations felt like "a hole in my heart." She states this lasted less than a few seconds and she proceeded to the tattoo parlor where she got her tattoo and felt okay throughout the day. She states she has had issues with "fuzziness" with quick movements/positions changes daily for 2 years. She has passed out once before and this felt similar. She denies chest pain, dyspnea, numbness, tingling, weakness, or current lightheadedness or palpitations. Denies family hx of sudden cardiac death or cardiac history to me, she did report to other staff that she has family members with "holes in their heart."  She also is requesting STD screening. When asked if she is symptomatic she states she is currently menstruating and that it smells like vinegar. She is sexually active with her boyfriend, no condom use, does admit to being sexually active with other individuals as well. Denies fever, chills, nausea, vomiting, vaginal discharge, or pelvic pain. Denies known std exposure.   HPI  Past Medical History:  Diagnosis Date  . Asthma   . Bipolar 1 disorder (HCC)   . Depression     Patient Active Problem List   Diagnosis Date Noted  . Schizoaffective disorder, depressive  type (HCC) 07/19/2016    Past Surgical History:  Procedure Laterality Date  . NO PAST SURGERIES       OB History    Gravida  1   Para  0   Term  0   Preterm  0   AB  1   Living  0     SAB  1   TAB  0   Ectopic  0   Multiple  0   Live Births  0            Home Medications    Prior to Admission medications   Medication Sig Start Date End Date Taking? Authorizing Provider  ARIPiprazole (ABILIFY IM) Inject into the muscle.    [provider]    Family History No family history on file.  Social History Social History   Tobacco Use  . Smoking status: Never Smoker  . Smokeless tobacco: Never Used  Substance Use Topics  . Alcohol use: No  . Drug use: No     Allergies   Patient has no known allergies.   Review of Systems Review of Systems  Constitutional: Negative for chills and fever.  Respiratory: Negative for shortness of breath.   Cardiovascular: Positive for palpitations. Negative for chest pain.  Gastrointestinal: Negative for abdominal pain, nausea and vomiting.  Genitourinary: Positive for vaginal bleeding (menstruation). Negative for dysuria, pelvic pain and vaginal discharge.  Neurological: Positive for syncope and light-headedness. Negative for weakness and numbness.  Negative for paresthesias.   All other systems reviewed and are negative.    Physical Exam Updated Vital Signs There were no vitals taken for this visit.  Physical Exam Vitals signs and nursing note reviewed.  Constitutional:      General: She is not in acute distress.    Appearance: She is well-developed. She is not toxic-appearing.  HENT:     Head: Normocephalic and atraumatic.  Eyes:     General:        Right eye: No discharge.        Left eye: No discharge.     Conjunctiva/sclera: Conjunctivae normal.  Neck:     Musculoskeletal: Neck supple.  Cardiovascular:     Rate and Rhythm: Normal rate and regular rhythm.     Pulses: Normal pulses.       Heart sounds: No murmur. No friction rub. No gallop.   Pulmonary:     Effort: Pulmonary effort is normal. No respiratory distress.     Breath sounds: Normal breath sounds. No wheezing, rhonchi or rales.  Abdominal:     General: There is no distension.     Palpations: Abdomen is soft.     Tenderness: There is no abdominal tenderness. There is no guarding or rebound.  Genitourinary:    Comments: Deferred after discussion with the patient.  Skin:    General: Skin is warm and dry.     Findings: No rash.  Neurological:     Mental Status: She is alert.     Comments: Alert. Clear speech. No facial droop. CNIII-XII grossly intact. Bilateral upper and lower extremities' sensation grossly intact. 5/5 symmetric strength with grip strength and with plantar and dorsi flexion bilaterally . Normal finger to nose bilaterally. Negative pronator drift. Negative Romberg sign. Gait is steady and intact.   Psychiatric:        Behavior: Behavior normal.    ED Treatments / Results  Labs (all labs ordered are listed, but only abnormal results are displayed) Labs Reviewed  WET PREP, GENITAL - Abnormal; Notable for the following components:      Result Value   WBC, Wet Prep HPF POC FEW (*)    All other components within normal limits  URINALYSIS, ROUTINE W REFLEX MICROSCOPIC - Abnormal; Notable for the following components:   Color, Urine AMBER (*)    APPearance CLOUDY (*)    Hgb urine dipstick LARGE (*)    Protein, ur 30 (*)    RBC / HPF >50 (*)    Bacteria, UA FEW (*)    All other components within normal limits  I-STAT CHEM 8, ED - Abnormal; Notable for the following components:   Creatinine, Ser 1.10 (*)    All other components within normal limits  RPR  HIV ANTIBODY (ROUTINE TESTING W REFLEX)  I-STAT BETA HCG BLOOD, ED ([facility], [facility], [facility] ONLY)  GC/CHLAMYDIA PROBE AMP ([place]) NOT AT [facility]    EKG EKG Interpretation  Date/Time:  Friday September 05 2018 13:26:08 EST Ventricular Rate:   78 PR Interval:  152 QRS Duration: 64 QT Interval:  336 QTC Calculation: 383 R Axis:   71 Text Interpretation:  Normal sinus rhythm with sinus arrhythmia Normal ECG Confirmed by Kennis Carina [phone]) on 09/05/2018 3:08:03 PM   Radiology No results found.  Procedures Procedures (including critical care time)  Medications Ordered in ED Medications - No data to display   Initial Impression / Assessment and Plan / ED Course  I have reviewed the triage  vital signs and the nursing notes.  Pertinent labs & imaging results that were available during my care of the patient were reviewed by me and considered in my medical decision making (see chart for details).   Patient presents w/ syncope yesterday also requesting STD check. Patient nontoxic appearing, in no apparent distress, vitals WNL. Benign exam.   Regarding syncope: Prodrome, brief, hx of similar. No family hx of sudden cardiac death. No associated chest pain. No neuro deficits noted. EKG w/ sinus rhythm. no concerning interval abnormalities or arrhythmias. No electrolyte disturbances. No anemia. Patient is not pregnant. No personal hx of HF or other concerning co-morbidities. Has appeared hemodynamically stable throughout ER stay. Close PCP follow up regarding this.   Regarding STD check: Relatively asymptomatic. No abdominal pain or tenderness, doubt PID. Discussed option of pelvic vs. Self swab w/ lack of sxs, patient elected to self swab. Urinalysis consistent with menstruation. Wet prep negative. GC/chlamydia, RPR, HIV pending. Patient aware of pending results & to seek tx/inform sexual partners if positive.   I discussed results, treatment plan, need for follow-up, and return precautions with the patient. Provided opportunity for questions, patient confirmed understanding and is in agreement with plan.   Findings and plan of care discussed with supervising physician Dr. Pilar PlateBero who personally evaluated and examined this patient and  is in agreement.    Final Clinical Impressions(s) / ED Diagnoses   Final diagnoses:  Syncope, unspecified syncope type  Screening examination for STD (sexually transmitted disease)    ED Discharge Orders    None       Cherly Andersonetrucelli, Even Budlong R, PA-C 09/05/18 1604    Sabas SousBero, Michael M, MD 09/06/18 1251

## 2018-09-08 LAB — GC/CHLAMYDIA PROBE AMP (~~LOC~~) NOT AT ARMC
Chlamydia: NEGATIVE
Neisseria Gonorrhea: NEGATIVE

## 2018-10-31 ENCOUNTER — Ambulatory Visit: Payer: Medicaid Other | Attending: Family Medicine | Admitting: Family Medicine

## 2018-10-31 ENCOUNTER — Encounter: Payer: Self-pay | Admitting: Family Medicine

## 2018-10-31 VITALS — BP 101/62 | HR 76 | Temp 98.9°F | Resp 18 | Ht 67.0 in | Wt 160.0 lb

## 2018-10-31 DIAGNOSIS — R55 Syncope and collapse: Secondary | ICD-10-CM | POA: Diagnosis not present

## 2018-10-31 NOTE — Progress Notes (Signed)
Subjective:    Patient ID: Melissa [name], female    DOB: 1996/05/30, 23 y.o.   MRN: [id number]  HPI       23 year old female new to the practice.  Patient reports that she is being seen secondary to recurrent episodes of passing out for which she was seen and evaluated in the emergency department.  Patient also states that she was contacted regarding positive test for chlamydia but patient only started the prescribed antibiotics 2 days ago.      Patient reports that she went to the emergency department earlier this year after 2 episodes of passing out.  Patient states that she did not fall or have any injury with either of the episodes.  Patient feels that as if the passing out was very brief.  Patient has had one other episode of feeling as if she would pass out in February but no additional episodes since that time.  Patient states that she will feel dizzy/lightheaded and have brief episode of feeling as if she has passed out and then will feel better after resting.  Upon questioning, she has noticed that this sensation that she will pass out does tend to occur with quick movements such as standing up quickly from a seated position.  Patient states that she has also had sensation that she will pass out while sitting as well but this happens mostly when she is also feeling anxious.  Patient states that this has occurred off and on for years.  She believes that she may have been told in the past that she was anemic.  She also states that she is on medication for mental health issues and that a mental health team comes to her home for counseling and to give her medication.  She does not believe that the medication is contributing to her syncopal episodes.  Patient states that she has been told in the past that her blood pressure tends to run low.  Patient does not really drink water/beverages throughout the day.  Patient has history of asthma but denies any current issues with shortness of breath,  wheezing, chest tightness or cough.  Patient states that her asthma can be triggered by odors/scents.  Past Medical History:  Diagnosis Date  . Asthma   . Bipolar 1 disorder (HCC)   . Depression    Past Surgical History:  Procedure Laterality Date  . NO PAST SURGERIES     Family History  Problem Relation Age of Onset  . Asthma Mother   . Hypertension Father    Social History   Tobacco Use  . Smoking status: Never Smoker  . Smokeless tobacco: Never Used  Substance Use Topics  . Alcohol use: No  . Drug use: No   No Known Allergies    Review of Systems  Constitutional: Positive for fatigue. Negative for chills and fever.  HENT: Negative for dental problem, hearing loss, sore throat and trouble swallowing.   Eyes: Negative for photophobia and visual disturbance.  Respiratory: Negative for cough, chest tightness, shortness of breath and wheezing.   Cardiovascular: Negative for chest pain, palpitations and leg swelling.  Gastrointestinal: Negative for abdominal pain, constipation, diarrhea, nausea and vomiting.  Endocrine: Negative for cold intolerance, heat intolerance, polydipsia, polyphagia and polyuria.  Musculoskeletal: Negative for arthralgias, back pain, gait problem, joint swelling and myalgias.  Neurological: Positive for dizziness, syncope and light-headedness. Negative for tremors, seizures, speech difficulty, weakness, numbness and headaches.  Hematological: Negative for adenopathy. Does not bruise/bleed easily.  Psychiatric/Behavioral: Negative for self-injury and suicidal ideas. The patient is nervous/anxious (sometimes).        Objective:   Physical Exam Vitals signs and nursing note reviewed.  Constitutional:      Appearance: Normal appearance. She is normal weight.  HENT:     Right Ear: Tympanic membrane, ear canal and external ear normal.     Left Ear: Tympanic membrane, ear canal and external ear normal.     Nose: Nose normal. No congestion or  rhinorrhea.     Mouth/Throat:     Mouth: Mucous membranes are moist.     Pharynx: Oropharynx is clear. No oropharyngeal exudate.  Eyes:     Extraocular Movements: Extraocular movements intact.     Conjunctiva/sclera: Conjunctivae normal.  Neck:     Musculoskeletal: Normal range of motion and neck supple. No muscular tenderness.  Cardiovascular:     Rate and Rhythm: Normal rate and regular rhythm.     Pulses: Normal pulses.     Heart sounds: Normal heart sounds.  Pulmonary:     Effort: Pulmonary effort is normal.     Breath sounds: Normal breath sounds. No wheezing.  Abdominal:     General: Bowel sounds are normal.     Palpations: Abdomen is soft.     Tenderness: There is no abdominal tenderness. There is no right CVA tenderness, left CVA tenderness, guarding or rebound.  Musculoskeletal: Normal range of motion.        General: No swelling, tenderness or deformity.     Right lower leg: No edema.     Left lower leg: No edema.  Lymphadenopathy:     Cervical: No cervical adenopathy.  Skin:    General: Skin is warm and dry.  Neurological:     General: No focal deficit present.     Mental Status: She is alert and oriented to person, place, and time.     Cranial Nerves: No cranial nerve deficit.     Motor: No weakness.     Coordination: Coordination normal.     Gait: Gait normal.  Psychiatric:        Mood and Affect: Mood normal.        Behavior: Behavior normal.    BP 101/62 (BP Location: Left Arm, Patient Position: Sitting, Cuff Size: Normal)   Pulse 76   Temp 98.9 F (37.2 C) (Oral)   Resp 18   Ht 5\' 7"  (1.702 m)   Wt 160 lb (72.6 kg)   LMP 10/31/2018   SpO2 95%   BMI 25.06 kg/m         Assessment & Plan:  1. Syncope, unspecified syncope type Patient with complaint of recurrent sensation of feeling as if she will pass out.  Patient's emergency department notes were reviewed at today's visit.  Patient did have normal EKG.  Patient's blood pressure today's visit is  101/62 and I suspect that she may have some issues with hypotension which may contribute to her syncopal episodes.  Discussed with patient the need to remain well-hydrated and to make slow changes in positions.  Patient will have BMP to look for electrolyte abnormality and CBC to look for presence of anemia.  Patient is to return to clinic in 2 weeks for reevaluation.  Patient also reports that she has was recently prescribed antibiotics for treatment of chlamydia and patient will likely have urinary cytology at her next visit to make sure that the infection has resolved. - CBC with Differential - Basic Metabolic Panel  An After Visit Summary was printed and given to the patient.  Return in about 2 weeks (around 11/14/2018) for fainting.

## 2018-10-31 NOTE — Patient Instructions (Signed)
Hypotension As your heart beats, it forces blood through your body. This force is called blood pressure. If you have hypotension, you have low blood pressure. When your blood pressure is too low, you may not get enough blood to your brain or other parts of your body. This may cause you to feel weak, light-headed, have a fast heartbeat, or even pass out (faint). Low blood pressure may be harmless, or it may cause serious problems. What are the causes?  Blood loss.  Not enough water in the body (dehydration).  Heart problems.  Hormone problems.  Pregnancy.  A very bad infection.  Not having enough of certain nutrients.  Very bad allergic reactions.  Certain medicines. What increases the risk?  Age. The risk increases as you get older.  Conditions that affect the heart or the brain and spinal cord (central nervous system).  Taking certain medicines.  Being pregnant. What are the signs or symptoms?  Feeling: ? Weak. ? Light-headed. ? Dizzy. ? Tired (fatigued).  Blurred vision.  Fast heartbeat.  Passing out, in very bad cases. How is this treated?  Changing your diet. This may involve eating more salt (sodium) or drinking more water.  Taking medicines to raise your blood pressure.  Changing how much you take (the dosage) of some of your medicines.  Wearing compression stockings. These stockings help to prevent blood clots and reduce swelling in your legs. In some cases, you may need to go to the hospital for:  Fluid replacement. This means you will receive fluids through an IV tube.  Blood replacement. This means you will receive donated blood through an IV tube (transfusion).  Treating an infection or heart problems, if this applies.  Monitoring. You may need to be monitored while medicines that you are taking wear off. Follow these instructions at home: Eating and drinking   Drink enough fluids to keep your pee (urine) pale yellow.  Eat a healthy diet.  Follow instructions from your doctor about what you can eat or drink. A healthy diet includes: ? Fresh fruits and vegetables. ? Whole grains. ? Low-fat (lean) meats. ? Low-fat dairy products.  Eat extra salt only as told. Do not add extra salt to your diet unless your doctor tells you to.  Eat small meals often.  Avoid standing up quickly after you eat. Medicines  Take over-the-counter and prescription medicines only as told by your doctor. ? Follow instructions from your doctor about changing how much you take of your medicines, if this applies. ? Do not stop or change any of your medicines on your own. General instructions   Wear compression stockings as told by your doctor.  Get up slowly from lying down or sitting.  Avoid hot showers and a lot of heat as told by your doctor.  Return to your normal activities as told by your doctor. Ask what activities are safe for you.  Do not use any products that contain nicotine or tobacco, such as cigarettes, e-cigarettes, and chewing tobacco. If you need help quitting, ask your doctor.  Keep all follow-up visits as told by your doctor. This is important. Contact a doctor if:  You throw up (vomit).  You have watery poop (diarrhea).  You have a fever for more than 2-3 days.  You feel more thirsty than normal.  You feel weak and tired. Get help right away if:  You have chest pain.  You have a fast or uneven heartbeat.  You lose feeling (have numbness) in any   part of your body.  You cannot move your arms or your legs.  You have trouble talking.  You get sweaty or feel light-headed.  You pass out.  You have trouble breathing.  You have trouble staying awake.  You feel mixed up (confused). Summary  Hypotension is also called low blood pressure. It is when the force of blood pumping through your arteries is too weak.  Hypotension may be harmless, or it may cause serious problems.  Treatment may include changing  your diet and medicines, and wearing compression stockings.  In very bad cases, you may need to go to the hospital. This information is not intended to replace advice given to you by your health care provider. Make sure you discuss any questions you have with your health care provider. Document Released: 11/07/2009 Document Revised: 02/06/2018 Document Reviewed: 02/06/2018 Elsevier Interactive Patient Education  2019 [employer]  

## 2018-10-31 NOTE — Progress Notes (Signed)
Patient states she is being treated for BV and Chlamydia. MA unable to see current medications but listed them as current.

## 2018-11-01 ENCOUNTER — Encounter: Payer: Self-pay | Admitting: Family Medicine

## 2018-11-01 LAB — CBC WITH DIFFERENTIAL/PLATELET
Basophils Absolute: 0 x10E3/uL (ref 0.0–0.2)
Basos: 1 %
EOS (ABSOLUTE): 0.1 x10E3/uL (ref 0.0–0.4)
Eos: 2 %
Hematocrit: 35.5 % (ref 34.0–46.6)
Hemoglobin: 12.1 g/dL (ref 11.1–15.9)
Immature Grans (Abs): 0 x10E3/uL (ref 0.0–0.1)
Immature Granulocytes: 0 %
Lymphocytes Absolute: 2.2 x10E3/uL (ref 0.7–3.1)
Lymphs: 36 %
MCH: 29.2 pg (ref 26.6–33.0)
MCHC: 34.1 g/dL (ref 31.5–35.7)
MCV: 86 fL (ref 79–97)
Monocytes Absolute: 0.6 x10E3/uL (ref 0.1–0.9)
Monocytes: 9 %
Neutrophils Absolute: 3.2 x10E3/uL (ref 1.4–7.0)
Neutrophils: 52 %
Platelets: 284 x10E3/uL (ref 150–450)
RBC: 4.14 x10E6/uL (ref 3.77–5.28)
RDW: 12.6 % (ref 11.7–15.4)
WBC: 6 x10E3/uL (ref 3.4–10.8)

## 2018-11-01 LAB — BASIC METABOLIC PANEL WITH GFR
BUN/Creatinine Ratio: 10 (ref 9–23)
BUN: 10 mg/dL (ref 6–20)
CO2: 24 mmol/L (ref 20–29)
Calcium: 9.4 mg/dL (ref 8.7–10.2)
Chloride: 103 mmol/L (ref 96–106)
Creatinine, Ser: 0.99 mg/dL (ref 0.57–1.00)
GFR calc Af Amer: 92 mL/min/1.73
GFR calc non Af Amer: 80 mL/min/1.73
Glucose: 100 mg/dL — ABNORMAL HIGH (ref 65–99)
Potassium: 4.3 mmol/L (ref 3.5–5.2)
Sodium: 141 mmol/L (ref 134–144)

## 2018-11-05 ENCOUNTER — Telehealth: Payer: Self-pay | Admitting: *Deleted

## 2018-11-05 NOTE — Telephone Encounter (Signed)
Patient verified DOB Patient is aware of labs being normal. Patient will keep 2 week follow up for low BP.

## 2018-11-05 NOTE — Telephone Encounter (Signed)
-----   Message from Cain Saupe, MD sent at 11/03/2018  6:15 PM EDT ----- Notify patient of normal CBC and normal BMP

## 2018-11-17 ENCOUNTER — Emergency Department (HOSPITAL_COMMUNITY)
Admission: EM | Admit: 2018-11-17 | Discharge: 2018-11-17 | Disposition: A | Discharge status: Home / Self Care | Payer: Medicaid Other | Attending: Emergency Medicine | Admitting: Emergency Medicine

## 2018-11-17 ENCOUNTER — Encounter (HOSPITAL_COMMUNITY): Payer: Self-pay | Admitting: Family Medicine

## 2018-11-17 ENCOUNTER — Ambulatory Visit (HOSPITAL_COMMUNITY)
Admission: EM | Admit: 2018-11-17 | Discharge: 2018-11-17 | Disposition: A | Discharge status: Home / Self Care | Payer: No Typology Code available for payment source | Source: Ambulatory Visit | Attending: Emergency Medicine | Admitting: Emergency Medicine

## 2018-11-17 DIAGNOSIS — Z792 Long term (current) use of antibiotics: Secondary | ICD-10-CM | POA: Diagnosis not present

## 2018-11-17 DIAGNOSIS — Y929 Unspecified place or not applicable: Secondary | ICD-10-CM | POA: Insufficient documentation

## 2018-11-17 DIAGNOSIS — J45909 Unspecified asthma, uncomplicated: Secondary | ICD-10-CM | POA: Insufficient documentation

## 2018-11-17 DIAGNOSIS — F329 Major depressive disorder, single episode, unspecified: Secondary | ICD-10-CM | POA: Insufficient documentation

## 2018-11-17 DIAGNOSIS — Z825 Family history of asthma and other chronic lower respiratory diseases: Secondary | ICD-10-CM | POA: Diagnosis not present

## 2018-11-17 DIAGNOSIS — Z0441 Encounter for examination and observation following alleged adult rape: Secondary | ICD-10-CM | POA: Insufficient documentation

## 2018-11-17 DIAGNOSIS — Y999 Unspecified external cause status: Secondary | ICD-10-CM | POA: Insufficient documentation

## 2018-11-17 DIAGNOSIS — Z113 Encounter for screening for infections with a predominantly sexual mode of transmission: Secondary | ICD-10-CM | POA: Diagnosis present

## 2018-11-17 DIAGNOSIS — Y939 Activity, unspecified: Secondary | ICD-10-CM | POA: Diagnosis not present

## 2018-11-17 DIAGNOSIS — Z79899 Other long term (current) drug therapy: Secondary | ICD-10-CM | POA: Insufficient documentation

## 2018-11-17 LAB — POC URINE PREG, ED
Preg Test, Ur: NEGATIVE
Preg Test, Ur: NEGATIVE

## 2018-11-17 LAB — URINALYSIS, ROUTINE W REFLEX MICROSCOPIC
Bilirubin Urine: NEGATIVE
Glucose, UA: NEGATIVE mg/dL
Hgb urine dipstick: NEGATIVE
Ketones, ur: NEGATIVE mg/dL
Leukocytes,Ua: NEGATIVE
Nitrite: NEGATIVE
Protein, ur: NEGATIVE mg/dL
Specific Gravity, Urine: 1.012 (ref 1.005–1.030)
pH: 5 (ref 5.0–8.0)

## 2018-11-17 MED ORDER — METRONIDAZOLE 500 MG PO TABS
2000.0000 mg | ORAL_TABLET | Freq: Once | ORAL | Status: AC
  Administered 2018-11-17: 2000 mg via ORAL
  Filled 2018-11-17: qty 4

## 2018-11-17 MED ORDER — CEFTRIAXONE SODIUM 250 MG IJ SOLR
250.0000 mg | Freq: Once | INTRAMUSCULAR | Status: AC
  Administered 2018-11-17: 250 mg via INTRAMUSCULAR
  Filled 2018-11-17: qty 250

## 2018-11-17 MED ORDER — AZITHROMYCIN 250 MG PO TABS
1000.0000 mg | ORAL_TABLET | Freq: Once | ORAL | Status: AC
  Administered 2018-11-17: 1000 mg via ORAL
  Filled 2018-11-17: qty 4

## 2018-11-17 MED ORDER — ULIPRISTAL ACETATE 30 MG PO TABS
30.0000 mg | ORAL_TABLET | Freq: Once | ORAL | Status: AC
  Administered 2018-11-17: 30 mg via ORAL
  Filled 2018-11-17: qty 1

## 2018-11-17 MED ORDER — LIDOCAINE HCL (PF) 1 % IJ SOLN
0.9000 mL | Freq: Once | INTRAMUSCULAR | Status: AC
  Administered 2018-11-17: 0.9 mL
  Filled 2018-11-17: qty 30

## 2018-11-17 NOTE — ED Notes (Signed)
Vitals delayed, pt with [facility] nurse.
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
## 2018-11-18 ENCOUNTER — Ambulatory Visit: Payer: Self-pay | Admitting: Pulmonary Disease
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
## 2019-01-07 ENCOUNTER — Telehealth: Attending: Internal Medicine | Primary: Internal Medicine

## 2019-01-07 ENCOUNTER — Telehealth: Admit: 2019-01-07 | Payer: PRIVATE HEALTH INSURANCE | Attending: Internal Medicine | Primary: Internal Medicine

## 2019-01-07 ENCOUNTER — Encounter: Attending: Internal Medicine | Primary: Internal Medicine

## 2019-01-07 DIAGNOSIS — L0292 Furuncle, unspecified: Secondary | ICD-10-CM

## 2019-01-07 MED ORDER — TRIMETHOPRIM-SULFAMETHOXAZOLE 160 MG-800 MG TAB
160-800 mg | ORAL_TABLET | Freq: Two times a day (BID) | ORAL | 0 refills | Status: DC
Start: 2019-01-07 — End: 2019-03-03

## 2019-01-07 NOTE — Progress Notes (Signed)
Call placed to patient and left message to call back. Results letter mailed to pt

## 2019-01-07 NOTE — Progress Notes (Signed)
Call placed to patient and left message to call back

## 2019-01-07 NOTE — Progress Notes (Signed)
LDL cholesterol mildly elevated and HDL cholesterol low. Recommend that patient watch diet for fatty foods and exercise as tolerated.   Urine specific gravity is high and also possible UTI.  If patient is symptomatic, may order Bactrim DS 1 tab po bid x 3 days.  Encourage oral water intake, as tolerated.  Vitamin D level is low. Vitamin D 50,000 units weekly x 12 weeks.  After completion of 12 weeks, recommend OTC Vitamin D3 1000 units daily.

## 2019-01-07 NOTE — Progress Notes (Signed)
Anna Frazier is a 23 y.o. female who was seen by synchronous (real-time) audio-video technology on 01/07/2019.      Consent: Anna Frazier, who was seen by synchronous (real-time) audio-video technology, and/or her healthcare decision maker, is aware that this patient-initiated, Telehealth encounter on 01/07/2019 is a billable service, with coverage as determined by her insurance carrier. She is aware that she may receive a bill and has provided verbal consent to proceed: Yes.      Assessment & Plan:   Diagnoses and all orders for this visit:    1. Boils of multiple sites    She has started having bowel problem for the last 5 years.  Before that she never had any bowel problem.  I am not sure if she had MRSA infection or she is a MRSA carrier.  I can see that she has long nails.  I have discussed at length about her possibilities of her being a MRSA carrier.  I have advised her to use antibacterial soap to take a bath and also keep her nails clean with hands and it is a day.  Unfortunately I am not physically able to see her so I cannot test her nose for possible MRSA carrier or not able to test the boil to send out for culture.  I will start,  -     trimethoprim-sulfamethoxazole (BACTRIM DS, SEPTRA DS) 160-800 mg per tablet; Take 1 Tab by mouth two (2) times a day.  The risks and benefits of treatment were discussed as well as the potential side effects. The patient verbalized understanding and agrees to the current treatment plan. The patient is instructed to call the office with any side effects    If she has recurrent bowel problem, I might send her to an allergy and immunologist.  2. Routine general medical examination at a health care facility    Not able to do physical right now.  Will order labs first.  -     CBC WITH AUTOMATED DIFF  -     HEMOGLOBIN A1C WITH EAG  -     METABOLIC PANEL, COMPREHENSIVE  -     LIPID PANEL  -     TSH 3RD GENERATION  -     URINALYSIS W/ RFLX MICROSCOPIC     3. Vitamin D deficiency  We will check,  -     VITAMIN D, 25 HYDROXY          I spent at least 40 minutes on this visit with this established patient. ([id number])    Subjective:   Anna Frazier is a 23 y.o. female who was seen for New Patient and Cyst (3 boils on left breast)    Anna Frazier is here to establish care.  She has been suffering from multiple boils in different part of her body for past 5 years on and off.  Different time her boils were drained and she was prescribed antibiotic.  She is not sure if she had MRSA infection.  She never been tested for it.  She did not have any bowel problem when she was young.  No fever now.  She has been having 3 large bowel coming up on her left side of her breast.  They are not draining it.  Otherwise she is healthy.  She is obese, trying to watch diet but not able to lose weight.  Sexually active, Pap smear done was normal.  She is doing superb breast self-exam.  Sleeping good.  No depression or anxiety.  Has asthma with exertion.  Using albuterol inhaler as needed.  Prior to Admission medications    Medication Sig Start Date End Date Taking? Authorizing Provider   trimethoprim-sulfamethoxazole (BACTRIM DS, SEPTRA DS) 160-800 mg per tablet Take 1 Tab by mouth two (2) times a day. 01/07/19  Yes Mingo AmberAli, Cierria Height, MD   albuterol [facility](PROAIR HFA) 90 mcg/actuation inhaler Take 2 puffs by inhalation every six (6) hours as needed for Wheezing. Indications: ACUTE ASTHMA ATTACK 08/31/13  Yes Mountain, [place] Lee, DO   biotin 2,500 mcg tab Take  by mouth daily.    Provider, Historical   HYDROcodone-acetaminophen (NORCO) 7.5-325 mg per tablet Take 1 Tab by mouth every six (6) hours as needed for Pain (cough). 09/05/12 01/07/19  [name], Thomas H Jr., PA     No Known Allergies    Past Medical History:   Diagnosis Date   ??? Asthma    ??? Asthma     Acute since childhood   ??? Contraception     Nexplanon left arm   ??? Obesity        ROS significant for multiple boils on the breast.    Objective:    Vital Signs: (As obtained by patient/caregiver at home)  Visit Vitals  Ht 5' (1.524 m)   Wt 174 lb (78.9 kg)   BMI 33.98 kg/m??            Constitutional: [x]  Appears well-developed and well-nourished [x]  No apparent distress      []  Abnormal -     Mental status: [x]  Alert and awake  [x]  Oriented to person/place/time [x]  Able to follow commands    []  Abnormal -     Eyes:   EOM    [x]   Normal    []  Abnormal -   Sclera  [x]   Normal    []  Abnormal -          Discharge [x]   None visible   []  Abnormal -     HENT: [x]  Normocephalic, atraumatic  []  Abnormal -   [x]  Mouth/Throat: Mucous membranes are moist    External Ears [x]  Normal  []  Abnormal -    Neck: [x]  No visualized mass []  Abnormal -     Pulmonary/Chest: [x]  Respiratory effort normal   [x]  No visualized signs of difficulty breathing or respiratory distress        []  Abnormal -      Musculoskeletal:   [x]  Normal gait with no signs of ataxia         [x]  Normal range of motion of neck        []  Abnormal -     Neurological:        [x]  No Facial Asymmetry (Cranial nerve 7 motor function) (limited exam due to video visit)          [x]  No gaze palsy        []  Abnormal -          Skin:        [x]  No significant exanthematous lesions or discoloration noted on facial skin         []  Abnormal -   Left breast: Approximately 3 x 4 cm size of boil with white head on lateral side of the left breast at the 2 breast balls are also adjacent to each.          Psychiatric:       [x]  Normal Affect []  Abnormal -        [  x] No Hallucinations    Other pertinent observable physical exam findings:-        We discussed the expected course, resolution and complications of the diagnosis(es) in detail.  Medication risks, benefits, costs, interactions, and alternatives were discussed as indicated.  I advised her to contact the office if her condition worsens, changes or fails to improve as anticipated. She expressed understanding with the diagnosis(es) and plan.        SAUSHA ZAPPIA is a 23 y.o. female who was evaluated by a video visit encounter for concerns as above. Patient identification was verified prior to start of the visit. A caregiver was present when appropriate. Due to this being a Scientist, research (medical) (During COVID-19 public health emergency), evaluation of the following organ systems was limited: Vitals/Constitutional/EENT/Resp/CV/GI/GU/MS/Neuro/Skin/Heme-Lymph-Imm.  Pursuant to the emergency declaration under the [facility] Act and the [employer], 1135 waiver authority and the [employer] Act, this Virtual  Visit was conducted, with patient's (and/or legal guardian's) consent, to reduce the patient's risk of exposure to COVID-19 and provide necessary medical care.     Services were provided through a video synchronous discussion virtually to substitute for in-person clinic visit.   Patient and provider were located at their individual homes.      Mingo Amber, MD

## 2019-01-07 NOTE — Progress Notes (Signed)
Anna Frazier is a 22 y.o. female  Chief Complaint   Patient presents with   ??? Cyst     3 boils on left breast     Visit Vitals  Ht 5' (1.524 m)   Wt 174 lb (78.9 kg)   BMI 33.98 kg/m??     1. Have you been to the ER, urgent care clinic since your last visit?  Hospitalized since your last visit?No    2. Have you seen or consulted any other health care providers outside of the [facility] since your last visit?  Include any pap smears or colon screening. No

## 2019-01-07 NOTE — Progress Notes (Signed)
 Anna Frazier is a 23 y.o. female  Chief Complaint   Patient presents with   . Cyst     3 boils on left breast     Visit Vitals  Ht 5' (1.524 m)   Wt 174 lb (78.9 kg)   BMI 33.98 kg/m     1. Have you been to the ER, urgent care clinic since your last visit?  Hospitalized since your last visit?No    2. Have you seen or consulted any other health care providers outside of the [facility] since your last visit?  Include any pap smears or colon screening. No

## 2019-01-07 NOTE — Progress Notes (Signed)
LDL cholesterol mildly elevated and HDL cholesterol low. Recommend that patient watch diet for fatty foods and exercise as tolerated.   Urine specific gravity is high and also possible UTI.  If patient is symptomatic, may order Bactrim DS 1 tab po bid x 3 days.  Encourage oral water intake, as tolerated.  Vitamin D level is low. Vitamin D 50,000 units weekly x 12 weeks.  After completion of 12 weeks, recommend OTC Vitamin D3 1000 units daily.

## 2019-01-07 NOTE — Progress Notes (Signed)
Anna Frazier is a 23 y.o. female who was seen by synchronous (real-time) audio-video technology on 01/07/2019.      Consent: Anna Frazier, who was seen by synchronous (real-time) audio-video technology, and/or her healthcare decision maker, is aware that this patient-initiated, Telehealth encounter on 01/07/2019 is a billable service, with coverage as determined by her insurance carrier. She is aware that she may receive a bill and has provided verbal consent to proceed: Yes.      Assessment & Plan:   Diagnoses and all orders for this visit:    1. Boils of multiple sites    She has started having bowel problem for the last 5 years.  Before that she never had any bowel problem.  I am not sure if she had MRSA infection or she is a MRSA carrier.  I can see that she has long nails.  I have discussed at length about her possibilities of her being a MRSA carrier.  I have advised her to use antibacterial soap to take a bath and also keep her nails clean with hands and it is a day.  Unfortunately I am not physically able to see her so I cannot test her nose for possible MRSA carrier or not able to test the boil to send out for culture.  I will start,  -     trimethoprim-sulfamethoxazole (BACTRIM DS, SEPTRA DS) 160-800 mg per tablet; Take 1 Tab by mouth two (2) times a day.  The risks and benefits of treatment were discussed as well as the potential side effects. The patient verbalized understanding and agrees to the current treatment plan. The patient is instructed to call the office with any side effects    If she has recurrent bowel problem, I might send her to an allergy and immunologist.  2. Routine general medical examination at a health care facility    Not able to do physical right now.  Will order labs first.  -     CBC WITH AUTOMATED DIFF  -     HEMOGLOBIN A1C WITH EAG  -     METABOLIC PANEL, COMPREHENSIVE  -     LIPID PANEL  -     TSH 3RD GENERATION  -     URINALYSIS W/ RFLX MICROSCOPIC    3. Vitamin D  deficiency  We will check,  -     VITAMIN D, 25 HYDROXY          I spent at least 40 minutes on this visit with this established patient. ([id number])    Subjective:   Anna Frazier is a 23 y.o. female who was seen for New Patient and Cyst (3 boils on left breast)    Anna Frazier is here to establish care.  She has been suffering from multiple boils in different part of her body for past 5 years on and off.  Different time her boils were drained and she was prescribed antibiotic.  She is not sure if she had MRSA infection.  She never been tested for it.  She did not have any bowel problem when she was young.  No fever now.  She has been having 3 large bowel coming up on her left side of her breast.  They are not draining it.  Otherwise she is healthy.  She is obese, trying to watch diet but not able to lose weight.  Sexually active, Pap smear done was normal.  She is doing superb breast self-exam.  Sleeping good.  No depression or anxiety.  Has asthma with exertion.  Using albuterol inhaler as needed.  Prior to Admission medications    Medication Sig Start Date End Date Taking? Authorizing Provider   trimethoprim-sulfamethoxazole (BACTRIM DS, SEPTRA DS) 160-800 mg per tablet Take 1 Tab by mouth two (2) times a day. 01/07/19  Yes Mingo Amber, MD   albuterol [facility] HFA) 90 mcg/actuation inhaler Take 2 puffs by inhalation every six (6) hours as needed for Wheezing. Indications: ACUTE ASTHMA ATTACK 08/31/13  Yes Mountain, [place], DO   biotin 2,500 mcg tab Take  by mouth daily.    Provider, Historical   HYDROcodone-acetaminophen (NORCO) 7.5-325 mg per tablet Take 1 Tab by mouth every six (6) hours as needed for Pain (cough). 09/05/12 01/07/19  Raelyn Number., PA     No Known Allergies    Past Medical History:   Diagnosis Date   ??? Asthma    ??? Asthma     Acute since childhood   ??? Contraception     Nexplanon left arm   ??? Obesity        ROS significant for multiple boils on the breast.    Objective:   Vital Signs: (As  obtained by patient/caregiver at home)  Visit Vitals  Ht 5' (1.524 m)   Wt 174 lb (78.9 kg)   BMI 33.98 kg/m??            Constitutional:  Appears well-developed and well-nourished  No apparent distress       Abnormal -     Mental status:  Alert and awake   Oriented to person/place/time  Able to follow commands     Abnormal -     Eyes:   EOM      Normal     Abnormal -   Sclera    Normal     Abnormal -          Discharge   None visible    Abnormal -     HENT:  Normocephalic, atraumatic   Abnormal -    Mouth/Throat: Mucous membranes are moist    External Ears  Normal   Abnormal -    Neck:  No visualized mass  Abnormal -     Pulmonary/Chest:  Respiratory effort normal    No visualized signs of difficulty breathing or respiratory distress         Abnormal -      Musculoskeletal:    Normal gait with no signs of ataxia          Normal range of motion of neck         Abnormal -     Neurological:         No Facial Asymmetry (Cranial nerve 7 motor function) (limited exam due to video visit)           No gaze palsy         Abnormal -          Skin:         No significant exanthematous lesions or discoloration noted on facial skin          Abnormal -   Left breast: Approximately 3 x 4 cm size of boil with white head on lateral side of the left breast at the 2 breast balls are also adjacent to each.          Psychiatric:        Normal Affect  Abnormal -        [  x] No Hallucinations    Other pertinent observable physical exam findings:-        We discussed the expected course, resolution and complications of the diagnosis(es) in detail.  Medication risks, benefits, costs, interactions, and alternatives were discussed as indicated.  I advised her to contact the office if her condition worsens, changes or fails to improve as anticipated. She expressed understanding with the diagnosis(es) and plan.       Anna Frazier is a 23 y.o. female who was  evaluated by a video visit encounter for concerns as above. Patient identification was verified prior to start of the visit. A caregiver was present when appropriate. Due to this being a Scientist, research (medical)TeleHealth encounter (During COVID-19 public health emergency), evaluation of the following organ systems was limited: Vitals/Constitutional/EENT/Resp/CV/GI/GU/MS/Neuro/Skin/Heme-Lymph-Imm.  Pursuant to the emergency declaration under the [employer] Act and the [employer] Emergen[employer] Act, 1135 waiver authority and the [employer] and [employer] Supplemental Appropriations Act, this Virtual  Visit was conducted, with patient's (and/or legal guardian's) consent, to reduce the patient's risk of exposure to COVID-19 and provide necessary medical care.     Services were provided through a video synchronous discussion virtually to substitute for in-person clinic visit.   Patient and provider were located at their individual homes.      Mingo AmberSabina Satchel Heidinger, MD

## 2019-03-03 ENCOUNTER — Telehealth: Attending: Internal Medicine | Primary: Internal Medicine

## 2019-03-03 ENCOUNTER — Telehealth: Admit: 2019-03-03 | Payer: PRIVATE HEALTH INSURANCE | Attending: Internal Medicine | Primary: Internal Medicine

## 2019-03-03 DIAGNOSIS — R109 Unspecified abdominal pain: Secondary | ICD-10-CM

## 2019-03-03 MED ORDER — ALUM-MAG HYDROXIDE-SIMETH 400 MG-400 MG-40 MG/5 ML ORAL SUSP
400-400-40 mg/5 mL | Freq: Four times a day (QID) | ORAL | 0 refills | Status: DC | PRN
Start: 2019-03-03 — End: 2019-03-30

## 2019-03-03 MED ORDER — ONDANSETRON HCL 4 MG TAB
4 mg | ORAL_TABLET | Freq: Three times a day (TID) | ORAL | 0 refills | Status: DC | PRN
Start: 2019-03-03 — End: 2020-09-28

## 2019-03-03 NOTE — Progress Notes (Signed)
Health Maintenance Due   Topic Date Due   ??? Pneumococcal 0-64 years (1 of 1 - PPSV23) 02/24/2000   ??? HPV Age 9Y-26Y (2 - 3-dose series) 09/11/2013   ??? DTaP/Tdap/Td series (1 - Tdap) 02/24/2015   ??? PAP AKA CERVICAL CYTOLOGY  02/24/2015       Chief Complaint   Patient presents with   ??? Abdominal Pain     seen yesterday at [facility]- gastritis dx, states intermittent nausea, decreased appetite, has GI appt on 03/20/2019   ??? Diarrhea     has improved       1. Have you been to the ER, urgent care clinic since your last visit?  Hospitalized since your last visit?No    2. Have you seen or consulted any other health care providers outside of the [facility] since your last visit?  Include any pap smears or colon screening. No    3) Do you have an Advance Directive on file? no    4) Are you interested in receiving information on Advance Directives? NO      Patient is accompanied by self I have received verbal consent from Anna Frazier to discuss any/all medical information while they are present in the room.

## 2019-03-03 NOTE — Progress Notes (Signed)
Anna Kohleriara M Mahmood is a 23 y.o. female who was seen by synchronous (real-time) audio-video technology on 03/03/2019 for No chief complaint on file.        Assessment & Plan:   Diagnoses and all orders for this visit:    1. Stomach pain  -     aluminum & magnesium hydroxide-simethicone (Mylanta Maximum Strength) 400-400-40 mg/5 mL suspension; Take 10 mL by mouth every six (6) hours as needed for Indigestion.    2. Mild asthma, unspecified whether complicated, unspecified whether persistent    3. BMI 33.0-33.9,adult    4. Vitamin D deficiency    5. Nausea  -     ondansetron hcl (ZOFRAN) 4 mg tablet; Take 1 Tab by mouth every eight (8) hours as needed for Nausea or Vomiting.          Subjective:     Has been having abdominal pain. Reports that it began last week. [facility] with a diagnosis as gastritis. States that food makes it worse. Describes the pain as tight and cramping. Is having nausea. Was having diarrhea but has since subsided. Denies blood in stool. Has been drinking water and soups. Was placed on pantoprazole and Zofran that has helped some, but thinks her symptoms have gotten worse.     Also reports that she has lost weight during this time.     Called GI and was able to schedule an appointment today     Asthma: stable and is only having to take albuterol as needed     Denies CP, SOB, fever   Prior to Admission medications    Medication Sig Start Date End Date Taking? Authorizing Provider   trimethoprim-sulfamethoxazole (BACTRIM DS, SEPTRA DS) 160-800 mg per tablet Take 1 Tab by mouth two (2) times a day. 01/07/19  Yes Mingo AmberAli, Sabina, MD   biotin 2,500 mcg tab Take  by mouth daily.   Yes Provider, Historical   albuterol (PROAIR HFA) 90 mcg/actuation inhaler Take 2 puffs by inhalation every six (6) hours as needed for Wheezing. Indications: ACUTE ASTHMA ATTACK 08/31/13  Yes Mountain, Meredith Modyhristina Lee, [place]     Patient Active Problem List   Diagnosis Code   ??? Asthma J45.909    ??? BMI 33.0-33.9,adult Z68.33     Patient Active Problem List    Diagnosis Date Noted   ??? Asthma 08/31/2013   ??? BMI 33.0-33.9,adult 08/31/2013     Current Outpatient Medications   Medication Sig Dispense Refill   ??? aluminum & magnesium hydroxide-simethicone (Mylanta Maximum Strength) 400-400-40 mg/5 mL suspension Take 10 mL by mouth every six (6) hours as needed for Indigestion. 335 mL 0   ??? ondansetron hcl (ZOFRAN) 4 mg tablet Take 1 Tab by mouth every eight (8) hours as needed for Nausea or Vomiting. 15 Tab 0   ??? biotin 2,500 mcg tab Take  by mouth daily.     ??? albuterol (PROAIR HFA) 90 mcg/actuation inhaler Take 2 puffs by inhalation every six (6) hours as needed for Wheezing. Indications: ACUTE ASTHMA ATTACK 1 Inhaler 6     No Known Allergies  Past Medical History:   Diagnosis Date   ??? Asthma    ??? Asthma     Acute since childhood   ??? Contraception     Nexplanon left arm   ??? Obesity      History reviewed. No pertinent surgical history.  Family History   Problem Relation Age of Onset   ???  Diabetes Mother    ??? Hypertension Mother    ??? Heart Disease Mother         SVT, ablation   ??? High Cholesterol Mother    ??? Other Mother         Fibromyalgia   ??? Cancer Maternal Grandmother 76        Breast and liver CA   ??? Heart Disease Maternal Grandmother    ??? Stroke Paternal Grandmother    ??? Heart Disease Paternal Grandfather    ??? Diabetes Other    ??? Hypertension Other      Social History     Tobacco Use   ??? Smoking status: Current Every Day Smoker   ??? Smokeless tobacco: Current User   ??? Tobacco comment: Black and milds 1-2 a day cigar   Substance Use Topics   ??? Alcohol use: No     Comment: socially       Review of Systems   Constitutional: Negative for chills and fever.   Respiratory: Negative.    Cardiovascular: Negative.    Gastrointestinal: Positive for abdominal pain and nausea. Negative for blood in stool, diarrhea and vomiting.   Genitourinary: Negative.    Musculoskeletal: Negative.     Neurological: Negative for dizziness and headaches.   Psychiatric/Behavioral: Negative.        Objective:   No flowsheet data found.     [INSTRUCTIONS:  "[x] " Indicates a positive item  "[] " Indicates a negative item  -- DELETE ALL ITEMS NOT EXAMINED]    Constitutional: [x]  Appears well-developed and well-nourished [x]  No apparent distress      []  Abnormal -     Mental status: [x]  Alert and awake  [x]  Oriented to person/place/time [x]  Able to follow commands    []  Abnormal -     Eyes:   EOM    [x]   Normal    []  Abnormal -   Sclera  [x]   Normal    []  Abnormal -          Discharge [x]   None visible   []  Abnormal -     HENT: [x]  Normocephalic, atraumatic  []  Abnormal -   [x]  Mouth/Throat: Mucous membranes are moist    External Ears [x]  Normal  []  Abnormal -    Neck: [x]  No visualized mass []  Abnormal -     Pulmonary/Chest: [x]  Respiratory effort normal   [x]  No visualized signs of difficulty breathing or respiratory distress        []  Abnormal -      Musculoskeletal:   [x]  Normal gait with no signs of ataxia         [x]  Normal range of motion of neck        []  Abnormal -     Neurological:        [x]  No Facial Asymmetry (Cranial nerve 7 motor function) (limited exam due to video visit)          [x]  No gaze palsy        []  Abnormal -          Skin:        [x]  No significant exanthematous lesions or discoloration noted on facial skin         []  Abnormal -            Psychiatric:       [x]  Normal Affect []  Abnormal -        [x]  No Hallucinations  Other pertinent observable physical exam findings:-        We discussed the expected course, resolution and complications of the diagnosis(es) in detail.  Medication risks, benefits, costs, interactions, and alternatives were discussed as indicated.  I advised her to contact the office if her condition worsens, changes or fails to improve as anticipated. She expressed understanding with the diagnosis(es) and plan.        Anna Frazier, who was evaluated through a patient-initiated, synchronous (real-time) audio-video encounter, and/or her healthcare decision maker, is aware that it is a billable service, with coverage as determined by her insurance carrier. She provided verbal consent to proceed: Yes, and patient identification was verified. It was conducted pursuant to the emergency declaration under the [employer] Act and the [employer] Emer[employer] Act, 1135 waiver authority and the [employer] and [employer] Supplemental Appropriations Act. A caregiver was present when appropriate. Ability to conduct physical exam was limited. I was at home. The patient was at home.      Maree ErieSymone M Oluwatoyin Banales, NP

## 2019-03-03 NOTE — Progress Notes (Signed)
Anna Frazier is a 23 y.o. female who was seen by synchronous (real-time) audio-video technology on 03/03/2019 for No chief complaint on file.        Assessment & Plan:   Diagnoses and all orders for this visit:    1. Stomach pain  -     aluminum & magnesium hydroxide-simethicone (Mylanta Maximum Strength) 400-400-40 mg/5 mL suspension; Take 10 mL by mouth every six (6) hours as needed for Indigestion.    2. Mild asthma, unspecified whether complicated, unspecified whether persistent    3. BMI 33.0-33.9,adult    4. Vitamin D deficiency    5. Nausea  -     ondansetron hcl (ZOFRAN) 4 mg tablet; Take 1 Tab by mouth every eight (8) hours as needed for Nausea or Vomiting.          Subjective:     Has been having abdominal pain. Reports that it began last week. [facility] and was sent home with a diagnosis as gastritis. States that food makes it worse. Describes the pain as tight and cramping. Is having nausea. Was having diarrhea but has since subsided. Denies blood in stool. Has been drinking water and soups. Was placed on pantoprazole and Zofran that has helped some, but thinks her symptoms have gotten worse.     Also reports that she has lost weight during this time.     Called GI and was able to schedule an appointment today     Asthma: stable and is only having to take albuterol as needed     Denies CP, SOB, fever   Prior to Admission medications    Medication Sig Start Date End Date Taking? Authorizing Provider   trimethoprim-sulfamethoxazole (BACTRIM DS, SEPTRA DS) 160-800 mg per tablet Take 1 Tab by mouth two (2) times a day. 01/07/19  Yes Bradd Canary, MD   biotin 2,500 mcg tab Take  by mouth daily.   Yes Provider, Historical   albuterol (PROAIR HFA) 90 mcg/actuation inhaler Take 2 puffs by inhalation every six (6) hours as needed for Wheezing. Indications: ACUTE ASTHMA ATTACK 08/31/13  Yes [place], [place], [place]     Patient Active Problem List   Diagnosis Code   ??? Asthma J45.909   ??? BMI  33.0-33.9,adult Z68.33     Patient Active Problem List    Diagnosis Date Noted   ??? Asthma 08/31/2013   ??? BMI 33.0-33.9,adult 08/31/2013     Current Outpatient Medications   Medication Sig Dispense Refill   ??? aluminum & magnesium hydroxide-simethicone (Mylanta Maximum Strength) 400-400-40 mg/5 mL suspension Take 10 mL by mouth every six (6) hours as needed for Indigestion. 335 mL 0   ??? ondansetron hcl (ZOFRAN) 4 mg tablet Take 1 Tab by mouth every eight (8) hours as needed for Nausea or Vomiting. 15 Tab 0   ??? biotin 2,500 mcg tab Take  by mouth daily.     ??? albuterol (PROAIR HFA) 90 mcg/actuation inhaler Take 2 puffs by inhalation every six (6) hours as needed for Wheezing. Indications: ACUTE ASTHMA ATTACK 1 Inhaler 6     No Known Allergies  Past Medical History:   Diagnosis Date   ??? Asthma    ??? Asthma     Acute since childhood   ??? Contraception     Nexplanon left arm   ??? Obesity      History reviewed. No pertinent surgical history.  Family History   Problem Relation Age of Onset   ???  Diabetes Mother    ??? Hypertension Mother    ??? Heart Disease Mother         SVT, ablation   ??? High Cholesterol Mother    ??? Other Mother         Fibromyalgia   ??? Cancer Maternal Grandmother [id number]        Breast and liver CA   ??? Heart Disease Maternal Grandmother    ??? Stroke Paternal Grandmother    ??? Heart Disease Paternal Grandfather    ??? Diabetes Other    ??? Hypertension Other      Social History     Tobacco Use   ??? Smoking status: Current Every Day Smoker   ??? Smokeless tobacco: Current User   ??? Tobacco comment: Black and milds 1-2 a day cigar   Substance Use Topics   ??? Alcohol use: No     Comment: socially       Review of Systems   Constitutional: Negative for chills and fever.   Respiratory: Negative.    Cardiovascular: Negative.    Gastrointestinal: Positive for abdominal pain and nausea. Negative for blood in stool, diarrhea and vomiting.   Genitourinary: Negative.    Musculoskeletal: Negative.    Neurological: Negative for dizziness and  headaches.   Psychiatric/Behavioral: Negative.        Objective:   No flowsheet data found.     [INSTRUCTIONS:  "[x] " Indicates a positive item  "[] " Indicates a negative item  -- DELETE ALL ITEMS NOT EXAMINED]    Constitutional: [x]  Appears well-developed and well-nourished [x]  No apparent distress      []  Abnormal -     Mental status: [x]  Alert and awake  [x]  Oriented to person/place/time [x]  Able to follow commands    []  Abnormal -     Eyes:   EOM    [x]   Normal    []  Abnormal -   Sclera  [x]   Normal    []  Abnormal -          Discharge [x]   None visible   []  Abnormal -     HENT: [x]  Normocephalic, atraumatic  []  Abnormal -   [x]  Mouth/Throat: Mucous membranes are moist    External Ears [x]  Normal  []  Abnormal -    Neck: [x]  No visualized mass []  Abnormal -     Pulmonary/Chest: [x]  Respiratory effort normal   [x]  No visualized signs of difficulty breathing or respiratory distress        []  Abnormal -      Musculoskeletal:   [x]  Normal gait with no signs of ataxia         [x]  Normal range of motion of neck        []  Abnormal -     Neurological:        [x]  No Facial Asymmetry (Cranial nerve 7 motor function) (limited exam due to video visit)          [x]  No gaze palsy        []  Abnormal -          Skin:        [x]  No significant exanthematous lesions or discoloration noted on facial skin         []  Abnormal -            Psychiatric:       [x]  Normal Affect []  Abnormal -        [x]  No Hallucinations  Other pertinent observable physical exam findings:-        We discussed the expected course, resolution and complications of the diagnosis(es) in detail.  Medication risks, benefits, costs, interactions, and alternatives were discussed as indicated.  I advised her to contact the office if her condition worsens, changes or fails to improve as anticipated. She expressed understanding with the diagnosis(es) and plan.       Anna Frazier, who was evaluated through a patient-initiated, synchronous (real-time) audio-video  encounter, and/or her healthcare decision maker, is aware that it is a billable service, with coverage as determined by her insurance carrier. She provided verbal consent to proceed: Yes, and patient identification was verified. It was conducted pursuant to the emergency declaration under the [employer] Act and the [employer] Emergencies Act, 1135 waiver authority and the [employer]onavirus Preparedness and [employer] Supplemental Appropriations Act. A caregiver was present when appropriate. Ability to conduct physical exam was limited. I was at home. The patient was at home.      Maree ErieSymone M Kaius Daino, NP

## 2019-03-03 NOTE — Progress Notes (Signed)
 Health Maintenance Due   Topic Date Due   . Pneumococcal 0-64 years (1 of 1 - PPSV23) 02/24/2000   . HPV Age 9Y-26Y (2 - 3-dose series) 09/11/2013   . DTaP/Tdap/Td series (1 - Tdap) 02/24/2015   . PAP AKA CERVICAL CYTOLOGY  02/24/2015       Chief Complaint   Patient presents with   . Abdominal Pain     [facility]- gastritis dx, states intermittent nausea, decreased appetite, has GI appt on 03/20/2019   . Diarrhea     has improved       1. Have you been to the ER, urgent care clinic since your last visit?  Hospitalized since your last visit?No    2. Have you seen or consulted any other health care providers outside of the [facility] since your last visit?  Include any pap smears or colon screening. No    3) Do you have an Advance Directive on file? no    4) Are you interested in receiving information on Advance Directives? NO      Patient is accompanied by self I have received verbal consent from Anna Frazier to discuss any/all medical information while they are present in the room.

## 2019-03-05 ENCOUNTER — Encounter

## 2019-03-12 ENCOUNTER — Encounter: Attending: Internal Medicine | Primary: Internal Medicine

## 2019-03-13 ENCOUNTER — Inpatient Hospital Stay: Admit: 2019-03-13 | Payer: PRIVATE HEALTH INSURANCE | Attending: Gastroenterology | Primary: Internal Medicine

## 2019-03-13 DIAGNOSIS — K219 Gastro-esophageal reflux disease without esophagitis: Secondary | ICD-10-CM

## 2019-03-13 MED ORDER — TECHNETIUM TC 99M SULFUR COLLOID
Freq: Once | Status: AC
Start: 2019-03-13 — End: 2019-03-13
  Administered 2019-03-13: 16:00:00 via ORAL

## 2019-03-30 ENCOUNTER — Ambulatory Visit: Attending: Internal Medicine | Primary: Internal Medicine

## 2019-03-30 ENCOUNTER — Ambulatory Visit
Admit: 2019-03-30 | Discharge: 2019-03-30 | Payer: PRIVATE HEALTH INSURANCE | Attending: Internal Medicine | Primary: Internal Medicine

## 2019-03-30 DIAGNOSIS — Z0000 Encounter for general adult medical examination without abnormal findings: Secondary | ICD-10-CM

## 2019-03-30 MED ORDER — ALBUTEROL SULFATE HFA 90 MCG/ACTUATION AEROSOL INHALER
90 mcg/actuation | Freq: Four times a day (QID) | RESPIRATORY_TRACT | 6 refills | Status: DC | PRN
Start: 2019-03-30 — End: 2020-09-28

## 2019-03-30 NOTE — Telephone Encounter (Signed)
[id number]; Anna Frazier states there is a referral from 7/7. Is another referral needed for another scheduled appt for pt?

## 2019-03-30 NOTE — Progress Notes (Signed)
HISTORY OF PRESENT ILLNESS  Anna Frazier is a 23 y.o. female here for complete physical.  She has been suffering from multiple boils.  I have treated her with Bactrim DS, which helped.  Right now one small cyst under her left breast.  No pus point.  No fever.  Has heartburn and acidity.  She has seen GI specialist.  She was diagnosed with a gastroparesis.  Using a nausea pill along with PPI.  Need Pap smear.  And she is up-to-date with Gardasil vaccine.  Has asthma, only needed albuterol inhaler  As needed.  Needed refill.  HPI    Review of Systems   Constitutional: Negative.    HENT: Negative.    Eyes: Negative.    Respiratory: Negative.    Cardiovascular: Negative.    Gastrointestinal: Negative.    Genitourinary: Negative.    Musculoskeletal: Negative.    Skin: Negative.         Cyst on breast   Neurological: Negative.    Endo/Heme/Allergies: Negative.    Psychiatric/Behavioral: Negative.        Physical Exam  Constitutional:       Appearance: Normal appearance.   HENT:      Head: Normocephalic and atraumatic.      Right Ear: Tympanic membrane normal.      Left Ear: Tympanic membrane normal.      Nose: Nose normal.   Eyes:      Pupils: Pupils are equal, round, and reactive to light.   Neck:      Musculoskeletal: Normal range of motion and neck supple.   Cardiovascular:      Rate and Rhythm: Normal rate and regular rhythm.      Pulses: Normal pulses.      Heart sounds: Normal heart sounds.   Pulmonary:      Effort: Pulmonary effort is normal.      Breath sounds: Normal breath sounds.   Abdominal:      General: Abdomen is flat. Bowel sounds are normal.      Palpations: Abdomen is soft.   Musculoskeletal: Normal range of motion.   Skin:     Comments: Small sebaceous cyst on left breast.no pus point.   Neurological:      General: No focal deficit present.      Mental Status: She is alert and oriented to person, place, and time. Mental status is at baseline.   Psychiatric:         Mood and Affect: Mood normal.          Behavior: Behavior normal.         Thought Content: Thought content normal.         ASSESSMENT and PLAN  Diagnoses and all orders for this visit:    1. Routine general medical examination at a health care facility    Seems healthy.  Advised to eat healthy and exercise.  All labs ordered, advised to do all lab work.    2. Asthma    Stable.  Will refill,  -     albuterol (ProAir HFA) 90 mcg/actuation inhaler; Take 2 Puffs by inhalation every six (6) hours as needed for Wheezing. Indications: asthma attack    3. BMI 33.0-33.9,adult  Addressed weight, diet and exercise with patient. Decrease carbohydrates (white foods, sweet foods, sweet drinks and alcohol), increase green leafy vegetables and protein (lean meats and beans) with each meal. Avoid fried foods. Eat 3-5 small meals daily. Do not skip meals. Increase water intake.  Increase physical activity to 30 minutes daily for health benefit or 60 minutes daily to prevent weight regain, as tolerated. Get 7-8 hours uninterrupted sleep nightly.    4. Boils of multiple sites  Improved.  Finished up antibiotic.  5. Gastroparesis  Seeing Dr. Sandu.  A gastric emptying study showed patient has gastroparesis.  Advised her to eat smaller meals with a lot of water.  6. Screening for malignant neoplasm of cervix  She believes she is up-to-date with HPV vaccine.  Will refer,  -     REFERRAL TO OBSTETRICS AND GYNECOLOGY

## 2019-03-30 NOTE — Progress Notes (Signed)
Health Maintenance Due   Topic Date Due   ??? Pneumococcal 0-64 years (1 of 1 - PPSV23) 02/24/2000   ??? HPV Age 9Y-26Y (2 - 3-dose series) 09/11/2013   ??? DTaP/Tdap/Td series (1 - Tdap) 02/24/2015   ??? PAP AKA CERVICAL CYTOLOGY  02/24/2015   ??? Influenza Age 25 to Adult  03/28/2019       Chief Complaint   Patient presents with   ??? Abdominal Pain     diagnosed with gastroparesis   ??? Asthma     refill inhaler       1. Have you been to the ER, urgent care clinic since your last visit?  Hospitalized since your last visit?No    2. Have you seen or consulted any other health care providers outside of the [facility] since your last visit?  Include any pap smears or colon screening. Yes When: saw GI, dx w/ gastroparesis    3) Do you have an Advance Directive on file? no    4) Are you interested in receiving information on Advance Directives? NO      Patient is accompanied by self I have received verbal consent from Anna Frazier to discuss any/all medical information while they are present in the room.

## 2019-03-30 NOTE — Progress Notes (Signed)
HISTORY OF PRESENT ILLNESS  Anna Frazier is a 23 y.o. female here for complete physical.  She has been suffering from multiple boils.  I have treated her with Bactrim DS, which helped.  Right now one small cyst under her left breast.  No pus point.  No fever.  Has heartburn and acidity.  She has seen GI specialist.  She was diagnosed with a gastroparesis.  Using a nausea pill along with PPI.  Need Pap smear.  And she is up-to-date with Gardasil vaccine.  Has asthma, only needed albuterol inhaler  As needed.  Needed refill.  HPI    Review of Systems   Constitutional: Negative.    HENT: Negative.    Eyes: Negative.    Respiratory: Negative.    Cardiovascular: Negative.    Gastrointestinal: Negative.    Genitourinary: Negative.    Musculoskeletal: Negative.    Skin: Negative.         Cyst on breast   Neurological: Negative.    Endo/Heme/Allergies: Negative.    Psychiatric/Behavioral: Negative.        Physical Exam  Constitutional:       Appearance: Normal appearance.   HENT:      Head: Normocephalic and atraumatic.      Right Ear: Tympanic membrane normal.      Left Ear: Tympanic membrane normal.      Nose: Nose normal.   Eyes:      Pupils: Pupils are equal, round, and reactive to light.   Neck:      Musculoskeletal: Normal range of motion and neck supple.   Cardiovascular:      Rate and Rhythm: Normal rate and regular rhythm.      Pulses: Normal pulses.      Heart sounds: Normal heart sounds.   Pulmonary:      Effort: Pulmonary effort is normal.      Breath sounds: Normal breath sounds.   Abdominal:      General: Abdomen is flat. Bowel sounds are normal.      Palpations: Abdomen is soft.   Musculoskeletal: Normal range of motion.   Skin:     Comments: Small sebaceous cyst on left breast.no pus point.   Neurological:      General: No focal deficit present.      Mental Status: She is alert and oriented to person, place, and time. Mental status is at baseline.   Psychiatric:         Mood and Affect: Mood normal.          Behavior: Behavior normal.         Thought Content: Thought content normal.         ASSESSMENT and PLAN  Diagnoses and all orders for this visit:    1. Routine general medical examination at a health care facility    Seems healthy.  Advised to eat healthy and exercise.  All labs ordered, advised to do all lab work.    2. Asthma    Stable.  Will refill,  -     albuterol (ProAir HFA) 90 mcg/actuation inhaler; Take 2 Puffs by inhalation every six (6) hours as needed for Wheezing. Indications: asthma attack    3. BMI 33.0-33.9,adult  Addressed weight, diet and exercise with patient. Decrease carbohydrates (white foods, sweet foods, sweet drinks and alcohol), increase green leafy vegetables and protein (lean meats and beans) with each meal. Avoid fried foods. Eat 3-5 small meals daily. Do not skip meals. Increase water intake.  Increase physical activity to 30 minutes daily for health benefit or 60 minutes daily to prevent weight regain, as tolerated. Get 7-8 hours uninterrupted sleep nightly.    4. Boils of multiple sites  Improved.  Finished up antibiotic.  5. Gastroparesis  Seeing Dr. Charleston PootSandu.  A gastric emptying study showed patient has gastroparesis.  Advised her to eat smaller meals with a lot of water.  6. Screening for malignant neoplasm of cervix  She believes she is up-to-date with HPV vaccine.  Will refer,  -     REFERRAL TO OBSTETRICS AND GYNECOLOGY

## 2019-03-30 NOTE — Progress Notes (Signed)
 Health Maintenance Due   Topic Date Due   . Pneumococcal 0-64 years (1 of 1 - PPSV23) 02/24/2000   . HPV Age 9Y-26Y (2 - 3-dose series) 09/11/2013   . DTaP/Tdap/Td series (1 - Tdap) 02/24/2015   . PAP AKA CERVICAL CYTOLOGY  02/24/2015   . Influenza Age 25 to Adult  03/28/2019       Chief Complaint   Patient presents with   . Abdominal Pain     diagnosed with gastroparesis   . Asthma     refill inhaler       1. Have you been to the ER, urgent care clinic since your last visit?  Hospitalized since your last visit?No    2. Have you seen or consulted any other health care providers outside of the [facility] since your last visit?  Include any pap smears or colon screening. Yes When: saw GI, dx w/ gastroparesis    3) Do you have an Advance Directive on file? no    4) Are you interested in receiving information on Advance Directives? NO      Patient is accompanied by self I have received verbal consent from Anna Frazier to discuss any/all medical information while they are present in the room.

## 2019-03-31 LAB — URINALYSIS W/ RFLX MICROSCOPIC
Bilirubin, Urine: NEGATIVE
Bilirubin: NEGATIVE
Blood, Urine: NEGATIVE
Blood: NEGATIVE
Glucose, UA: NEGATIVE
Glucose: NEGATIVE
Leukocyte Esterase, Urine: NEGATIVE
Leukocyte Esterase: NEGATIVE
Nitrite, Urine: POSITIVE — AB
Nitrites: POSITIVE — AB
Specific Gravity, UA: >=1.03 — AB (ref 1.005–1.030)
Specific Gravity: >=1.03 — AB (ref 1.005–1.030)
Urobilinogen, Urine: 1 mg/dL (ref 0.2–1.0)
Urobilinogen: 1 mg/dL (ref 0.2–1.0)
pH (UA): 5.5 (ref 5.0–7.5)
pH, UA: 5.5 NA (ref 5.0–7.5)

## 2019-03-31 LAB — MICROSCOPIC EXAMINATION
Casts UA: NONE SEEN /lpf
Casts: NONE SEEN /lpf
WBC, UA: NONE SEEN /hpf (ref 0–5)
WBC: NONE SEEN /hpf (ref 0–5)

## 2019-03-31 LAB — METABOLIC PANEL, COMPREHENSIVE
A-G Ratio: 1.2 (ref 1.2–2.2)
ALT (SGPT): 13 IU/L (ref 0–32)
AST (SGOT): 16 IU/L (ref 0–40)
Albumin: 3.9 g/dL (ref 3.9–5.0)
Alk. phosphatase: 84 IU/L (ref 39–117)
BUN/Creatinine ratio: 7 — ABNORMAL LOW (ref 9–23)
BUN: 5 mg/dL — ABNORMAL LOW (ref 6–20)
Bilirubin, total: 0.4 mg/dL (ref 0.0–1.2)
CO2: 22 mmol/L (ref 20–29)
Calcium: 9.4 mg/dL (ref 8.7–10.2)
Chloride: 102 mmol/L (ref 96–106)
Creatinine: 0.76 mg/dL (ref 0.57–1.00)
GFR est AA: 126 mL/min/{1.73_m2} (ref >59)
GFR est non-AA: 109 mL/min/{1.73_m2} (ref >59)
GLOBULIN, TOTAL: 3.3 g/dL (ref 1.5–4.5)
Glucose: 81 mg/dL (ref 65–99)
Potassium: 4.3 mmol/L (ref 3.5–5.2)
Protein, total: 7.2 g/dL (ref 6.0–8.5)
Sodium: 138 mmol/L (ref 134–144)

## 2019-03-31 LAB — CBC WITH AUTOMATED DIFF
ABS. BASOPHILS: 0 10*3/uL (ref 0.0–0.2)
ABS. EOSINOPHILS: 0.1 10*3/uL (ref 0.0–0.4)
ABS. IMM. GRANS.: 0 10*3/uL (ref 0.0–0.1)
ABS. MONOCYTES: 0.7 10*3/uL (ref 0.1–0.9)
ABS. NEUTROPHILS: 5.4 10*3/uL (ref 1.4–7.0)
Abs Lymphocytes: 2 10*3/uL (ref 0.7–3.1)
BASOPHILS: 1 %
EOSINOPHILS: 1 %
HCT: 36.3 % (ref 34.0–46.6)
HGB: 11.5 g/dL (ref 11.1–15.9)
IMMATURE GRANULOCYTES: 0 %
Lymphocytes: 24 %
MCH: 27.3 pg (ref 26.6–33.0)
MCHC: 31.7 g/dL (ref 31.5–35.7)
MCV: 86 fL (ref 79–97)
MONOCYTES: 9 %
NEUTROPHILS: 65 %
PLATELET: 381 10*3/uL (ref 150–450)
RBC: 4.22 x10E6/uL (ref 3.77–5.28)
RDW: 16.8 % — ABNORMAL HIGH (ref 11.7–15.4)
WBC: 8.2 10*3/uL (ref 3.4–10.8)

## 2019-03-31 LAB — HEMOGLOBIN A1C WITH EAG
Estimated average glucose: 88 mg/dL
Hemoglobin A1c: 4.7 % — ABNORMAL LOW (ref 4.8–5.6)

## 2019-03-31 LAB — LIPID PANEL
Cholesterol, Total: 151 mg/dL (ref 100–199)
Cholesterol, total: 151 mg/dL (ref 100–199)
HDL Cholesterol: 20 mg/dL — ABNORMAL LOW (ref >39)
HDL: 20 mg/dL — ABNORMAL LOW (ref >39)
LDL Calculated: 117 mg/dL — ABNORMAL HIGH (ref 0–99)
LDL, calculated: 117 mg/dL — ABNORMAL HIGH (ref 0–99)
Triglyceride: 68 mg/dL (ref 0–149)
Triglycerides: 68 mg/dL (ref 0–149)
VLDL Cholesterol Calculated: 14 mg/dL (ref 5–40)
VLDL, calculated: 14 mg/dL (ref 5–40)

## 2019-03-31 LAB — CVD REPORT

## 2019-03-31 LAB — TSH 3RD GENERATION
TSH: 0.637 u[IU]/mL (ref 0.450–4.500)
TSH: 0.637 u[IU]/mL (ref 0.450–4.500)

## 2019-03-31 LAB — VITAMIN D, 25 HYDROXY: VITAMIN D, 25-HYDROXY: 8.7 ng/mL — ABNORMAL LOW (ref 30.0–100.0)

## 2019-03-31 LAB — CBC WITH AUTO DIFFERENTIAL
Basophils %: 1 %
Basophils Absolute: 0 10*3/uL (ref 0.0–0.2)
Eosinophils %: 1 %
Eosinophils Absolute: 0.1 10*3/uL (ref 0.0–0.4)
Granulocyte Absolute Count: 0 10*3/uL (ref 0.0–0.1)
Hematocrit: 36.3 % (ref 34.0–46.6)
Hemoglobin: 11.5 g/dL (ref 11.1–15.9)
Immature Granulocytes: 0 %
Lymphocytes %: 24 %
Lymphocytes Absolute: 2 10*3/uL (ref 0.7–3.1)
MCH: 27.3 pg (ref 26.6–33.0)
MCHC: 31.7 g/dL (ref 31.5–35.7)
MCV: 86 fL (ref 79–97)
Monocytes %: 9 %
Monocytes Absolute: 0.7 10*3/uL (ref 0.1–0.9)
Neutrophils %: 65 %
Neutrophils Absolute: 5.4 10*3/uL (ref 1.4–7.0)
Platelets: 381 10*3/uL (ref 150–450)
RBC: 4.22 x10E6/uL (ref 3.77–5.28)
RDW: 16.8 % — ABNORMAL HIGH (ref 11.7–15.4)
WBC: 8.2 10*3/uL (ref 3.4–10.8)

## 2019-03-31 LAB — COMPREHENSIVE METABOLIC PANEL
ALT: 13 IU/L (ref 0–32)
AST: 16 IU/L (ref 0–40)
Albumin/Globulin Ratio: 1.2 NA (ref 1.2–2.2)
Albumin: 3.9 g/dL (ref 3.9–5.0)
Alkaline Phosphatase: 84 IU/L (ref 39–117)
BUN: 5 mg/dL — ABNORMAL LOW (ref 6–20)
Bun/Cre Ratio: 7 NA — ABNORMAL LOW (ref 9–23)
CO2: 22 mmol/L (ref 20–29)
Calcium: 9.4 mg/dL (ref 8.7–10.2)
Chloride: 102 mmol/L (ref 96–106)
Creatinine: 0.76 mg/dL (ref 0.57–1.00)
EGFR IF NonAfrican American: 109 mL/min/{1.73_m2} (ref >59)
GFR African American: 126 mL/min/{1.73_m2} (ref >59)
Globulin, Total: 3.3 g/dL (ref 1.5–4.5)
Glucose: 81 mg/dL (ref 65–99)
Potassium: 4.3 mmol/L (ref 3.5–5.2)
Sodium: 138 mmol/L (ref 134–144)
Total Bilirubin: 0.4 mg/dL (ref 0.0–1.2)
Total Protein: 7.2 g/dL (ref 6.0–8.5)

## 2019-03-31 LAB — HEMOGLOBIN A1C W/EAG
Hemoglobin A1C: 4.7 % — ABNORMAL LOW (ref 4.8–5.6)
eAG: 88 mg/dL

## 2019-03-31 LAB — VITAMIN D 25 HYDROXY: Vit D, 25-Hydroxy: 8.7 ng/mL — ABNORMAL LOW (ref 30.0–100.0)

## 2019-04-01 MED ORDER — ERGOCALCIFEROL (VITAMIN D2) 50,000 UNIT CAP
1250 mcg (50,000 unit) | ORAL_CAPSULE | ORAL | 0 refills | Status: DC
Start: 2019-04-01 — End: 2020-09-28

## 2019-04-01 NOTE — Telephone Encounter (Signed)
Spoke to patient today on the phone. She has established care with Dr. Steva Ready and as far as she knows she does not need an insurance referral for this doctor. Pt is going to see her OB and as far as she knows she does not need an insurance referral for this doctor. Pt is to contact [facility] to inform if insurance auth is needed for her ob or if we need to fax referral to them. 04/01/19

## 2019-05-05 ENCOUNTER — Emergency Department (HOSPITAL_COMMUNITY)
Admission: EM | Admit: 2019-05-05 | Discharge: 2019-05-05 | Disposition: A | Discharge status: Home / Self Care | Payer: Medicaid Other | Attending: Emergency Medicine | Admitting: Emergency Medicine

## 2019-05-05 ENCOUNTER — Emergency Department (HOSPITAL_COMMUNITY): Payer: Medicaid Other

## 2019-05-05 DIAGNOSIS — J45909 Unspecified asthma, uncomplicated: Secondary | ICD-10-CM | POA: Insufficient documentation

## 2019-05-05 DIAGNOSIS — T7421XA Adult sexual abuse, confirmed, initial encounter: Secondary | ICD-10-CM | POA: Insufficient documentation

## 2019-05-05 LAB — URINALYSIS, ROUTINE W REFLEX MICROSCOPIC
Bilirubin Urine: NEGATIVE
Glucose, UA: NEGATIVE mg/dL
Hgb urine dipstick: NEGATIVE
Ketones, ur: NEGATIVE mg/dL
Leukocytes,Ua: NEGATIVE
Nitrite: NEGATIVE
Protein, ur: NEGATIVE mg/dL
Specific Gravity, Urine: 1.015 (ref 1.005–1.030)
pH: 5 (ref 5.0–8.0)

## 2019-05-05 LAB — CBC
HCT: 37.2 % (ref 36.0–46.0)
Hemoglobin: 11.8 g/dL — ABNORMAL LOW (ref 12.0–15.0)
MCH: 28.6 pg (ref 26.0–34.0)
MCHC: 31.7 g/dL (ref 30.0–36.0)
MCV: 90.1 fL (ref 80.0–100.0)
Platelets: 230 10*3/uL (ref 150–400)
RBC: 4.13 MIL/uL (ref 3.87–5.11)
RDW: 13.6 % (ref 11.5–15.5)
WBC: 5.3 10*3/uL (ref 4.0–10.5)
nRBC: 0 % (ref 0.0–0.2)

## 2019-05-05 LAB — COMPREHENSIVE METABOLIC PANEL
ALT: 15 U/L (ref 0–44)
AST: 19 U/L (ref 15–41)
Albumin: 3.9 g/dL (ref 3.5–5.0)
Alkaline Phosphatase: 53 U/L (ref 38–126)
Anion gap: 9 (ref 5–15)
BUN: 13 mg/dL (ref 6–20)
CO2: 22 mmol/L (ref 22–32)
Calcium: 9.4 mg/dL (ref 8.9–10.3)
Chloride: 108 mmol/L (ref 98–111)
Creatinine, Ser: 1.1 mg/dL — ABNORMAL HIGH (ref 0.44–1.00)
GFR calc Af Amer: >60 mL/min (ref >60)
GFR calc non Af Amer: >60 mL/min (ref >60)
Glucose, Bld: 94 mg/dL (ref 70–99)
Potassium: 4 mmol/L (ref 3.5–5.1)
Sodium: 139 mmol/L (ref 135–145)
Total Bilirubin: 0.8 mg/dL (ref 0.3–1.2)
Total Protein: 7.1 g/dL (ref 6.5–8.1)

## 2019-05-05 LAB — LIPASE, BLOOD: Lipase: 34 U/L (ref 11–51)

## 2019-05-05 MED ORDER — CEFTRIAXONE SODIUM 250 MG IJ SOLR
250.0000 mg | Freq: Once | INTRAMUSCULAR | Status: AC
  Administered 2019-05-05: 250 mg via INTRAMUSCULAR
  Filled 2019-05-05: qty 250

## 2019-05-05 MED ORDER — METRONIDAZOLE 500 MG PO TABS
2000.0000 mg | ORAL_TABLET | Freq: Once | ORAL | Status: AC
  Administered 2019-05-05: 2000 mg via ORAL
  Filled 2019-05-05: qty 4

## 2019-05-05 MED ORDER — LIDOCAINE HCL (PF) 1 % IJ SOLN: 0.9000 mL | Freq: Once | INTRAMUSCULAR | Status: DC

## 2019-05-05 MED ORDER — AZITHROMYCIN 250 MG PO TABS
1000.0000 mg | ORAL_TABLET | Freq: Once | ORAL | Status: AC
  Administered 2019-05-05: 1000 mg via ORAL
  Filled 2019-05-05: qty 4
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
## 2019-05-05 NOTE — ED Notes (Signed)
Patient verbalizes understanding of discharge instructions. Opportunity for questioning and answers were provided. Armband removed by staff, pt discharged from ED ambulatory to home.  

## 2019-05-05 NOTE — SANE Note (Addendum)
On 05/05/2019, at approximately 1320 hours, I spoke with the pt about what treatment options were available to her.  The pt requested STI prophylactic medications for an incident that occurred approximately 7 days ago (~last Tuesday, 04-28-2019).  The pt was advised that she was outside the treatment window for Emergency Contraception, but that a urine POC test would need to be performed on the pt during this visit.  The pt verbalized her understanding.    The SANE Orders were entered in Epic for the pt.  The pt also declined a pelvic exam.  The pt advised that she will follow-up with STI testing & Pregnancy testing at the [name], on 05-27-19 (an appointment that was already scheduled for her prior to this encounter).  The pt will continue counseling services with [facility].  The pt denied having any pain during this visit, and declined to speak with law enforcement.  The pt's requests for treatment were discussed with the ED Provider.  The SANE/[facility]NE [employer]) consult has been completed. The physician has been notified. Please contact the SANE/FNE nurse on call (listed in [place]) with any further concerns.

## 2019-05-05 NOTE — ED Triage Notes (Signed)
C/o LRQ pain x 2 days, reports cough x 1 week.  No NVD, but experiences a "woozy" spell daily at 5pm

## 2019-05-07 NOTE — Discharge Instructions (Signed)
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 discoloration, vaginal irritation including itching or discharge, loss of feeling (numbness) in parts of your body, feeling like the room is spinning, funny or bad taste in your mouth. This list may not describe all possible side effects.  If you notice other effects not listed above, contact your doctor.  You may report side effects to the Food & Drug Administration (FDA) at 1-800-FDA-1088. PRECAUTIONS:  Your doctor or healthcare provider needs to know if you have any of the following conditions:  kidney disease, liver disease, irregular heartbeat or heart disease, an unusual or  allergic reaction to azithromycin, erythromycin, other macrolide antibiotics, foods, dyes, or preservatives, are pregnant or trying to get pregnant, are breast-feeding.   Tell your doctor or healthcare provider if your symptoms do not improve.  Do not treat diarrhea with over the counter products.  Contact  you doctor if you have diarrhea that lasts more than 2 days or if it is severe and watery.   DRUG INTERACTIONS:  Do not take this medicine with lincomycin.  This medicine may also interact with the following medications:  amiodaron, antacids (because they may keep azithromycin from working properly), cyclosporine (Gengraf, Neoral, Sandimmune), digoxin (Digitek, Lanoxin), dihydroergotamine or ergotamine, Cafergot, Ergomar, Migranal, magnesium, nelfinavir, phenytoin, warfarin (Coumadin).  Make sure your doctor or healthcare provider knows if you are also using atorvastatin (Lipitor), cetirizine (Zyrtec),  medications for HIV or AIDS (efavirenz, indinavir, nelfinavir, zidovudine, Retrovir, Videx, or Viracept), or medications for seizures (carbamazepine, hexobarbital, phenytoin, Carbartrol, Dilantin, or Tegretol). This document does not contain all possible interactions.  Give your doctor or healthcare provider a list of all the medications, herbs, non-prescription drugs, or dietary supplements you use.  Also tell them if you smoke, drink alcohol, or use illegal drugs.  Some items may interact with your medicine.   NOTES:  Do not share this medication with others.  If you think you have taken too much of this medicine, contact a poison control center or emergency room at once. MISSED DOSE:  If you miss a dose, take it as soon as you can.  If it is almost time for your next dose, take only that dose.  Do not take double or extra doses. STORAGE:  Store at room temperature between 59-86 degrees F (15-30 degrees C), away from light and moisture.  Do not store in the bathroom.  Keep all medicines away from children and pets.  Do not flush medications down the toilet or pour them into the drain unless instructed to do so.  Properly discard this product when it is expired or no longer needed.  Consult your pharmacist or local waste disposal company for more details about how to safely discard this product.

## 2019-05-16 ENCOUNTER — Emergency Department (HOSPITAL_COMMUNITY)
Admission: EM | Admit: 2019-05-16 | Discharge: 2019-05-16 | Disposition: A | Discharge status: Home / Self Care | Payer: Medicaid Other | Attending: Emergency Medicine | Admitting: Emergency Medicine

## 2019-05-16 ENCOUNTER — Encounter (HOSPITAL_COMMUNITY): Payer: Self-pay | Admitting: *Deleted

## 2019-05-16 ENCOUNTER — Other Ambulatory Visit: Payer: Self-pay

## 2019-05-16 DIAGNOSIS — J45909 Unspecified asthma, uncomplicated: Secondary | ICD-10-CM | POA: Insufficient documentation

## 2019-05-16 DIAGNOSIS — N760 Acute vaginitis: Secondary | ICD-10-CM | POA: Insufficient documentation

## 2019-05-16 DIAGNOSIS — Z0441 Encounter for examination and observation following alleged adult rape: Secondary | ICD-10-CM | POA: Insufficient documentation

## 2019-05-16 DIAGNOSIS — B9689 Other specified bacterial agents as the cause of diseases classified elsewhere: Secondary | ICD-10-CM

## 2019-05-16 LAB — WET PREP, GENITAL
Sperm: NONE SEEN
Trich, Wet Prep: NONE SEEN
Yeast Wet Prep HPF POC: NONE SEEN

## 2019-05-16 LAB — POC URINE PREG, ED: Preg Test, Ur: NEGATIVE

## 2019-05-16 MED ORDER — METRONIDAZOLE 500 MG PO TABS: 500.0000 mg | ORAL_TABLET | Freq: Two times a day (BID) | ORAL | 0 refills | Status: AC

## 2019-05-16 MED ORDER — LIDOCAINE HCL (PF) 1 % IJ SOLN: 0.9000 mL | Freq: Once | INTRAMUSCULAR | Status: DC

## 2019-05-16 MED ORDER — AZITHROMYCIN 250 MG PO TABS
1000.0000 mg | ORAL_TABLET | Freq: Once | ORAL | Status: AC
  Administered 2019-05-16: 1000 mg via ORAL
  Filled 2019-05-16: qty 4

## 2019-05-16 MED ORDER — LIDOCAINE HCL (PF) 1 % IJ SOLN
INTRAMUSCULAR | Status: AC
  Administered 2019-05-16: 0.9 mL
  Filled 2019-05-16: qty 5

## 2019-05-16 MED ORDER — CEFTRIAXONE SODIUM 250 MG IJ SOLR
250.0000 mg | Freq: Once | INTRAMUSCULAR | Status: AC
  Administered 2019-05-16: 250 mg via INTRAMUSCULAR
  Filled 2019-05-16: qty 250

## 2019-05-16 MED ORDER — METRONIDAZOLE 500 MG PO TABS
2000.0000 mg | ORAL_TABLET | Freq: Once | ORAL | Status: AC
  Administered 2019-05-16: 2000 mg via ORAL
  Filled 2019-05-16: qty 4
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
## 2019-05-16 NOTE — SANE Note (Signed)
The SANE/FNE (Forensic Nurse Examiner) consult has been completed. The primary or charge RN and physician have been notified. Please contact the SANE/FNE nurse on call (listed in Amion) with any further concerns.  

## 2019-05-16 NOTE — ED Notes (Signed)
Gave pt sandwich and apple juice
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
## 2019-05-16 NOTE — ED Notes (Signed)
Sane nurse was offered to assist patient with care. Pt declined.

## 2019-05-16 NOTE — Discharge Instructions (Signed)
You can reach out to the family [place] for support. Starting tomorrow, take the antibiotic as prescribed until gone. Follow-up closely with your primary care provider in 2 weeks for follow-up STD testing. Return to the emergency department for new or concerning symptoms.

## 2019-05-17 NOTE — SANE Note (Signed)
I was contacted by P.A. [place] Robinson regarding this patient.  [place] says Engineer, civil (consulting) were offered multiple times and the patient adamantly declined any contact with our department. [place] states the patient is asking for sexually transmitted infection medications and called to find out how to get into the SANE order set.   We talked through Forensic Service medication protocol for all STI prophylactics including how to get to the order set.

## 2019-05-27 ENCOUNTER — Other Ambulatory Visit: Payer: Self-pay

## 2019-05-27 ENCOUNTER — Ambulatory Visit: Payer: Medicaid Other

## 2019-05-27 ENCOUNTER — Encounter: Payer: Self-pay | Admitting: Family Medicine

## 2019-05-27 ENCOUNTER — Ambulatory Visit: Payer: Medicaid Other | Attending: Family Medicine | Admitting: Family Medicine

## 2019-05-27 VITALS — BP 109/73 | HR 83 | Temp 99.7°F | Resp 20 | Ht 67.0 in | Wt 165.0 lb

## 2019-05-27 DIAGNOSIS — R55 Syncope and collapse: Secondary | ICD-10-CM | POA: Diagnosis not present

## 2019-05-27 DIAGNOSIS — Z09 Encounter for follow-up examination after completed treatment for conditions other than malignant neoplasm: Secondary | ICD-10-CM

## 2019-05-27 DIAGNOSIS — H1131 Conjunctival hemorrhage, right eye: Secondary | ICD-10-CM | POA: Diagnosis not present

## 2019-05-27 DIAGNOSIS — F251 Schizoaffective disorder, depressive type: Secondary | ICD-10-CM | POA: Diagnosis not present

## 2019-05-27 DIAGNOSIS — R42 Dizziness and giddiness: Secondary | ICD-10-CM

## 2019-05-27 NOTE — Progress Notes (Signed)
Blood vessel in right eye Feels like she will faint and gets dizzy. Has concerns about BP. Had 1 syncope episode.   Would like a higher Rx for Abilify

## 2019-05-27 NOTE — Patient Instructions (Signed)
Subconjunctival Hemorrhage Subconjunctival hemorrhage is bleeding that happens between the white part of your eye (sclera) and the clear membrane that covers the outside of your eye (conjunctiva). There are many tiny blood vessels near the surface of your eye. A subconjunctival hemorrhage happens when one or more of these vessels breaks and bleeds, causing a red patch to appear on your eye. This is similar to a bruise. Depending on the amount of bleeding, the red patch may only cover a small area of your eye or it may cover the entire visible part of the sclera. If a lot of blood collects under the conjunctiva, there may also be swelling. Subconjunctival hemorrhages do not affect your vision or cause pain, but your eye may feel irritated if there is swelling. Subconjunctival hemorrhages usually do not require treatment, and they usually disappear on their own within two weeks. What are the causes? This condition may be caused by:  Mild trauma, such as rubbing your eye too hard.  Blunt injuries, such as from playing sports or having contact with a deployed airbag.  Coughing, sneezing, or vomiting.  Straining, such as when lifting a heavy object.  High blood pressure.  Recent eye surgery.  Diabetes.  Certain medicines, especially blood thinners (anticoagulants).  Other conditions, such as eye tumors, bleeding disorders, or blood vessel abnormalities. Subconjunctival hemorrhages can also happen without an obvious cause. What are the signs or symptoms? Symptoms of this condition include:  A bright red or dark red patch on the white part of the eye. The red area may: ? Spread out to cover a larger area of the eye before it goes away. ? Turn brownish-yellow before it goes away.  Swelling around the eye.  Mild eye irritation. How is this diagnosed? This condition is diagnosed with a physical exam. If your subconjunctival hemorrhage was caused by trauma, your health care provider may refer  you to an eye specialist (ophthalmologist) or another specialist to check for other injuries. You may have other tests, including:  An eye exam.  A blood pressure check.  Blood tests to check for bleeding disorders. If your subconjunctival hemorrhage was caused by trauma, X-rays or a CT scan may be done to check for other injuries. How is this treated? Usually, treatment is not needed for this condition. If you have discomfort, your health care provider may recommend eye drops or cold compresses. Follow these instructions at home:  Take over-the-counter and prescription medicines only as directed by your health care provider.  Use eye drops or cold compresses to help with discomfort as directed by your health care provider.  Avoid activities, things, and environments that may irritate or injure your eye.  Keep all follow-up visits as told by your health care provider. This is important. Contact a health care provider if:  You have pain in your eye.  The bleeding does not go away within 3 weeks.  You keep getting new subconjunctival hemorrhages. Get help right away if:  Your vision changes or you have difficulty seeing.  You suddenly develop severe sensitivity to light.  You develop a severe headache, persistent vomiting, confusion, or abnormal tiredness (lethargy).  Your eye seems to bulge or protrude from your eye socket.  You develop unexplained bruises on your body.  You have unexplained bleeding in another area of your body. Summary  Subconjunctival hemorrhage is bleeding that happens between the white part of your eye and the clear membrane that covers the outside of your eye.  This condition   is similar to a bruise.  Subconjunctival hemorrhages usually do not require treatment, and they usually disappear on their own within two weeks.  Use eye drops or cold compresses to help with discomfort as directed by your health care provider. This information is not  intended to replace advice given to you by your health care provider. Make sure you discuss any questions you have with your health care provider. Document Released: 08/13/2005 Document Revised: 05/14/2018 Document Reviewed: 05/14/2018 Elsevier Patient Education  2020 [employer]  
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
## 2019-05-28 ENCOUNTER — Encounter: Payer: Self-pay | Admitting: Neurology

## 2019-05-28 LAB — BASIC METABOLIC PANEL WITH GFR
BUN/Creatinine Ratio: 11 (ref 9–23)
BUN: 11 mg/dL (ref 6–20)
CO2: 23 mmol/L (ref 20–29)
Calcium: 9.1 mg/dL (ref 8.7–10.2)
Chloride: 110 mmol/L — ABNORMAL HIGH (ref 96–106)
Creatinine, Ser: 0.97 mg/dL (ref 0.57–1.00)
GFR calc Af Amer: 94 mL/min/1.73
GFR calc non Af Amer: 81 mL/min/1.73
Glucose: 80 mg/dL (ref 65–99)
Potassium: 4.1 mmol/L (ref 3.5–5.2)
Sodium: 141 mmol/L (ref 134–144)

## 2019-05-31 ENCOUNTER — Encounter: Payer: Self-pay | Admitting: Family Medicine

## 2019-06-14 ENCOUNTER — Other Ambulatory Visit: Payer: Self-pay

## 2019-06-14 ENCOUNTER — Emergency Department (HOSPITAL_COMMUNITY)
Admission: EM | Admit: 2019-06-14 | Discharge: 2019-06-14 | Disposition: A | Discharge status: Home / Self Care | Payer: Medicaid Other | Attending: Emergency Medicine | Admitting: Emergency Medicine

## 2019-06-14 ENCOUNTER — Encounter (HOSPITAL_COMMUNITY): Payer: Self-pay

## 2019-06-14 DIAGNOSIS — N898 Other specified noninflammatory disorders of vagina: Secondary | ICD-10-CM | POA: Insufficient documentation

## 2019-06-14 LAB — URINALYSIS, ROUTINE W REFLEX MICROSCOPIC
Bacteria, UA: NONE SEEN
Bilirubin Urine: NEGATIVE
Glucose, UA: NEGATIVE mg/dL
Ketones, ur: NEGATIVE mg/dL
Leukocytes,Ua: NEGATIVE
Nitrite: NEGATIVE
Protein, ur: NEGATIVE mg/dL
Specific Gravity, Urine: 1.018 (ref 1.005–1.030)
pH: 6 (ref 5.0–8.0)

## 2019-06-14 LAB — POC URINE PREG, ED: Preg Test, Ur: NEGATIVE

## 2019-06-14 NOTE — ED Provider Notes (Signed)
[facility] EMERGENCY DEPARTMENT Provider Note   CSN: [id number] Arrival date & time: 06/14/19  9326     History   Chief Complaint Chief Complaint  Patient presents with  . vaginal complaint    HPI Melissa [name] is a 23 y.o. female.     23yo female presents with complaint of a painful sore on her right labia. Initially states this has been present for 2 months, then states she noticed the area 2 days ago. Area feels irritated, painful with wiping while using the bathroom. Denies vaginal discharge, itching, new sexual partners. LMP a few days ago. No other complaints or concerns.      Past Medical History:  Diagnosis Date  . Asthma   . Bipolar 1 disorder ([facility])   . Depression     Patient Active Problem List   Diagnosis Date Noted  . Schizoaffective disorder, depressive type ([facility]) 07/19/2016    Past Surgical History:  Procedure Laterality Date  . NO PAST SURGERIES       OB History    Gravida  1   Para  0   Term  0   Preterm  0   AB  1   Living  0     SAB  1   TAB  0   Ectopic  0   Multiple  0   Live Births  0            Home Medications    Prior to Admission medications   Medication Sig Start Date End Date Taking? Authorizing Provider  ARIPiprazole ER (ABILIFY MAINTENA) 400 MG SRER injection Inject 400 mg into the muscle every 28 (twenty-eight) days.    [provider]    Family History Family History  Problem Relation Age of Onset  . Asthma Mother   . Hypertension Father     Social History Social History   Tobacco Use  . Smoking status: Never Smoker  . Smokeless tobacco: Never Used  Substance Use Topics  . Alcohol use: No  . Drug use: Yes    Frequency: 4.0 times per week    Types: Marijuana     Allergies   Patient has no known allergies.   Review of Systems Review of Systems  Constitutional: Negative for fever.  Gastrointestinal: Negative for abdominal pain, constipation, diarrhea,  nausea and vomiting.  Genitourinary: Negative for dysuria, frequency, pelvic pain, urgency, vaginal bleeding, vaginal discharge and vaginal pain.  Musculoskeletal: Negative for arthralgias and myalgias.  Skin: Positive for wound.  Allergic/Immunologic: Negative for immunocompromised state.  Hematological: Negative for adenopathy.  Psychiatric/Behavioral: Negative for confusion.  All other systems reviewed and are negative.    Physical Exam Updated Vital Signs BP 110/60 (BP Location: Right Arm)   Pulse 84   Temp 97.8 F (36.6 C) (Oral)   Resp 12   Ht 5\' 7"  (1.702 m)   Wt 74.8 kg   SpO2 100%   BMI 25.84 kg/m   Physical Exam Vitals signs and nursing note reviewed. Exam conducted with a chaperone present.  Constitutional:      General: She is not in acute distress.    Appearance: She is well-developed. She is not diaphoretic.  HENT:     Head: Normocephalic and atraumatic.  Pulmonary:     Effort: Pulmonary effort is normal.  Genitourinary:    General: Normal vulva.     Pubic Area: No rash or pubic lice.      Vagina: No vaginal discharge.  Neurological:     Mental Status: She is alert and oriented to person, place, and time.  Psychiatric:        Behavior: Behavior normal.      ED Treatments / Results  Labs (all labs ordered are listed, but only abnormal results are displayed) Labs Reviewed  URINALYSIS, ROUTINE W REFLEX MICROSCOPIC - Abnormal; Notable for the following components:      Result Value   Hgb urine dipstick SMALL (*)    All other components within normal limits  POC URINE PREG, ED    EKG None  Radiology No results found.  Procedures Procedures (including critical care time)  Medications Ordered in ED Medications - No data to display   Initial Impression / Assessment and Plan / ED Course  I have reviewed the triage vital signs and the nursing notes.  Pertinent labs & imaging results that were available during my care of the patient were  reviewed by me and considered in my medical decision making (see chart for details).  Clinical Course as of Jun 13 948  Sun Jun 13, 6673  7561 23 year old female presents with complaint of sore on her right labia for the past 2 days.  No history of herpes previously.  Chaperone present for exam, no evidence of any vaginal sores or lesions at this time.  Patient checked the area herself, unable to locate the previous identified sore.  Advised patient to monitor area, if sore returns, consider RPR versus herpes culture, advised patient this could be done through her PCP office, the health department, [facility], return as needed.   [LM]    Clinical Course User Index [LM] Tacy Learn, PA-C      Final Clinical Impressions(s) / ED Diagnoses   Final diagnoses:  Vaginal sore    ED Discharge Orders    None       Tacy Learn, PA-C 06/14/19 [id number]    Isla Pence, MD 06/14/19 1014

## 2019-06-14 NOTE — ED Triage Notes (Signed)
Patient complains of raw area on labia x 2 months. States that it is irritating daily, NAD

## 2019-06-14 NOTE — Discharge Instructions (Addendum)
Follow-up with your primary care provider or [place] or the women's clinic for STD screening if needed or desired.  If your sore returns, follow-up with any of the above resources, consider testing for herpes.  Your pregnancy test today is negative.  [facility] [place] [facility] [phone] Walk in GYN clinic 4PM-7:30PM Monday-Wednesday
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
## 2019-06-21 ENCOUNTER — Encounter (HOSPITAL_COMMUNITY): Payer: Self-pay | Admitting: Emergency Medicine

## 2019-06-21 ENCOUNTER — Other Ambulatory Visit: Payer: Self-pay

## 2019-06-21 ENCOUNTER — Emergency Department (HOSPITAL_COMMUNITY)
Admission: EM | Admit: 2019-06-21 | Discharge: 2019-06-21 | Disposition: A | Discharge status: Home / Self Care | Payer: Medicaid Other | Attending: Emergency Medicine | Admitting: Emergency Medicine

## 2019-06-21 DIAGNOSIS — B373 Candidiasis of vulva and vagina: Secondary | ICD-10-CM | POA: Diagnosis not present

## 2019-06-21 DIAGNOSIS — J45909 Unspecified asthma, uncomplicated: Secondary | ICD-10-CM | POA: Insufficient documentation

## 2019-06-21 DIAGNOSIS — Z79899 Other long term (current) drug therapy: Secondary | ICD-10-CM | POA: Diagnosis not present

## 2019-06-21 DIAGNOSIS — Z202 Contact with and (suspected) exposure to infections with a predominantly sexual mode of transmission: Secondary | ICD-10-CM | POA: Insufficient documentation

## 2019-06-21 DIAGNOSIS — N898 Other specified noninflammatory disorders of vagina: Secondary | ICD-10-CM | POA: Diagnosis present

## 2019-06-21 DIAGNOSIS — B3731 Acute candidiasis of vulva and vagina: Secondary | ICD-10-CM

## 2019-06-21 LAB — URINALYSIS, ROUTINE W REFLEX MICROSCOPIC
Bilirubin Urine: NEGATIVE
Glucose, UA: NEGATIVE mg/dL
Hgb urine dipstick: NEGATIVE
Ketones, ur: 20 mg/dL — AB
Leukocytes,Ua: NEGATIVE
Nitrite: NEGATIVE
Protein, ur: NEGATIVE mg/dL
Specific Gravity, Urine: 1.02 (ref 1.005–1.030)
pH: 5 (ref 5.0–8.0)

## 2019-06-21 LAB — PREGNANCY, URINE: Preg Test, Ur: NEGATIVE

## 2019-06-21 LAB — WET PREP, GENITAL
Clue Cells Wet Prep HPF POC: NONE SEEN
Sperm: NONE SEEN
Trich, Wet Prep: NONE SEEN

## 2019-06-21 MED ORDER — FLUCONAZOLE 150 MG PO TABS
150.0000 mg | ORAL_TABLET | Freq: Once | ORAL | Status: AC
  Administered 2019-06-21: 150 mg via ORAL
  Filled 2019-06-21: qty 1

## 2019-06-21 MED ORDER — CEFTRIAXONE SODIUM 250 MG IJ SOLR
250.0000 mg | Freq: Once | INTRAMUSCULAR | Status: AC
  Administered 2019-06-21: 250 mg via INTRAMUSCULAR
  Filled 2019-06-21: qty 250

## 2019-06-21 MED ORDER — AZITHROMYCIN 250 MG PO TABS
1000.0000 mg | ORAL_TABLET | Freq: Once | ORAL | Status: AC
  Administered 2019-06-21: 1000 mg via ORAL
  Filled 2019-06-21: qty 4

## 2019-06-21 MED ORDER — LIDOCAINE HCL (PF) 1 % IJ SOLN
5.0000 mL | Freq: Once | INTRAMUSCULAR | Status: AC
  Administered 2019-06-21: 5 mL
  Filled 2019-06-21: qty 5

## 2019-06-21 MED ORDER — FLUCONAZOLE 150 MG PO TABS: 150.0000 mg | ORAL_TABLET | Freq: Every day | ORAL | 0 refills | Status: DC

## 2019-06-21 MED ORDER — ONDANSETRON 4 MG PO TBDP
4.0000 mg | ORAL_TABLET | Freq: Once | ORAL | Status: AC
  Administered 2019-06-21: 4 mg via ORAL
  Filled 2019-06-21: qty 1

## 2019-06-21 NOTE — ED Provider Notes (Signed)
[facility] EMERGENCY DEPARTMENT Provider Note   CSN: [id number] Arrival date & time: 06/21/19  1223     History   Chief Complaint Chief Complaint  Patient presents with  . SEXUALLY TRANSMITTED DISEASE    HPI Melissa [name] is a 23 y.o. female with history of asthma, bipolar 1 disorder who presents with vaginal discharge for the past 2 weeks.  She has had some foul odor associated.  She also reports an irritated area her labia.  Patient reports the last time she was here she threw up a lot of the medication given for STD prophylaxis and she is unsure if it completely treated.  Per chart review, I cannot find results from GC/chlamydia at this visit.  Patient denies any abdominal pain, urinary symptoms, fevers.     HPI  Past Medical History:  Diagnosis Date  . Asthma   . Bipolar 1 disorder ([place])   . Depression     Patient Active Problem List   Diagnosis Date Noted  . Schizoaffective disorder, depressive type ([place]) 07/19/2016    Past Surgical History:  Procedure Laterality Date  . NO PAST SURGERIES       OB History    Gravida  1   Para  0   Term  0   Preterm  0   AB  1   Living  0     SAB  1   TAB  0   Ectopic  0   Multiple  0   Live Births  0            Home Medications    Prior to Admission medications   Medication Sig Start Date End Date Taking? Authorizing Provider  ARIPiprazole ER (ABILIFY MAINTENA) 400 MG SRER injection Inject 400 mg into the muscle every 28 (twenty-eight) days.    [provider]  fluconazole (DIFLUCAN) 150 MG tablet Take 1 tablet (150 mg total) by mouth daily for 3 days. 06/21/19 06/24/19  Frederica Kuster, PA-C    Family History Family History  Problem Relation Age of Onset  . Asthma Mother   . Hypertension Father     Social History Social History   Tobacco Use  . Smoking status: Never Smoker  . Smokeless tobacco: Never Used  Substance Use Topics  . Alcohol use: No  . Drug  use: Yes    Frequency: 4.0 times per week    Types: Marijuana     Allergies   Patient has no known allergies.   Review of Systems Review of Systems  Constitutional: Negative for chills and fever.  HENT: Negative for facial swelling and sore throat.   Respiratory: Negative for shortness of breath.   Cardiovascular: Negative for chest pain.  Gastrointestinal: Negative for abdominal pain, nausea and vomiting.  Genitourinary: Positive for vaginal discharge. Negative for dysuria.  Musculoskeletal: Negative for back pain.  Skin: Negative for rash and wound.  Neurological: Negative for headaches.  Psychiatric/Behavioral: The patient is not nervous/anxious.      Physical Exam Updated Vital Signs BP 108/74   Pulse 97   Temp 99.1 F (37.3 C) (Oral)   Resp 16   Ht 5\' 7"  (1.702 m)   Wt 74.8 kg   LMP 06/07/2019   SpO2 100%   BMI 25.84 kg/m   Physical Exam Vitals signs and nursing note reviewed. Exam conducted with a chaperone present.  Constitutional:      General: She is not in acute distress.  Appearance: She is well-developed. She is not diaphoretic.  HENT:     Head: Normocephalic and atraumatic.     Mouth/Throat:     Pharynx: No oropharyngeal exudate.  Eyes:     General: No scleral icterus.       Right eye: No discharge.        Left eye: No discharge.     Conjunctiva/sclera: Conjunctivae normal.     Pupils: Pupils are equal, round, and reactive to light.  Neck:     Musculoskeletal: Normal range of motion and neck supple.     Thyroid: No thyromegaly.  Cardiovascular:     Rate and Rhythm: Normal rate and regular rhythm.     Heart sounds: Normal heart sounds. No murmur. No friction rub. No gallop.   Pulmonary:     Effort: Pulmonary effort is normal. No respiratory distress.     Breath sounds: Normal breath sounds. No stridor. No wheezing or rales.  Abdominal:     General: Bowel sounds are normal. There is no distension.     Palpations: Abdomen is soft.      Tenderness: There is no abdominal tenderness. There is no guarding or rebound.  Genitourinary:    Vagina: Vaginal discharge (white, cottage cheese like) present.     Cervix: No cervical motion tenderness.     Uterus: Normal.      Adnexa:        Right: No tenderness.         Left: No tenderness.      Lymphadenopathy:     Cervical: No cervical adenopathy.  Skin:    General: Skin is warm and dry.     Coloration: Skin is not pale.     Findings: No rash.  Neurological:     Mental Status: She is alert.     Coordination: Coordination normal.      ED Treatments / Results  Labs (all labs ordered are listed, but only abnormal results are displayed) Labs Reviewed  WET PREP, GENITAL - Abnormal; Notable for the following components:      Result Value   Yeast Wet Prep HPF POC   (*)    Value: Specimen diluted due to transport tube containing more than 1 ml of saline, interpret results with caution.   WBC, Wet Prep HPF POC MANY (*)    All other components within normal limits  URINALYSIS, ROUTINE W REFLEX MICROSCOPIC - Abnormal; Notable for the following components:   Ketones, ur 20 (*)    All other components within normal limits  HSV CULTURE AND TYPING  PREGNANCY, URINE  GC/CHLAMYDIA PROBE AMP ([place]) NOT AT [name]    EKG None  Radiology No results found.  Procedures Procedures (including critical care time)  Medications Ordered in ED Medications  cefTRIAXone (ROCEPHIN) injection 250 mg (has no administration in time range)  azithromycin (ZITHROMAX) tablet 1,000 mg (has no administration in time range)  fluconazole (DIFLUCAN) tablet 150 mg (has no administration in time range)  ondansetron (ZOFRAN-ODT) disintegrating tablet 4 mg (has no administration in time range)  lidocaine (PF) (XYLOCAINE) 1 % injection 5 mL (has no administration in time range)     Initial Impression / Assessment and Plan / ED Course  I have reviewed the triage vital signs and the nursing  notes.  Pertinent labs & imaging results that were available during my care of the patient were reviewed by me and considered in my medical decision making (see chart for details).  Patient reports vaginal discharge with foul odor for the past 2 weeks.  She is found to have yeast.  She would like to be treated for STD prophylactically however.  Patient treated in the ED for STI with Rocephin, azithromycin. Patient advised to inform and treat all sexual partners.  Pt advised on safe sex practices and understands that they have GC/Chlamydia cultures pending and will result in 2-3 days. HIV and RPR declined. Pt encouraged to follow up at local health department for future STI checks. No concern for PID.  There is a blood pressure of 230/130 on patient chart, which is an error.  Discussed return precautions. Pt appears safe for discharge.    Final Clinical Impressions(s) / ED Diagnoses   Final diagnoses:  Vaginal discharge  Vaginal candidiasis    ED Discharge Orders         Ordered    fluconazole (DIFLUCAN) 150 MG tablet  Daily     06/21/19 1700           Emi HolesLaw, Catrina Fellenz M, [place]PA-C 06/21/19 1701    Little, Ambrose Finlandachel Morgan, MD 06/23/19 1338

## 2019-06-21 NOTE — Discharge Instructions (Signed)
You have been treated for gonorrhea and chlamydia today, as well as yeast.  Make sure to repeat dose of Diflucan in 3 days if you still have discharge.  You will be called in 3 days if any of your tests return positive. In that case, please make all of your sexual partners aware that they will need to be treated as well. Abstain from intercourse for one week until you have both been treated. Use condoms in the future to help prevent sexually transmitted disease and unwanted pregnancy. You can go to the health department in the future for free STD testing.

## 2019-06-21 NOTE — ED Notes (Signed)
Pt asking how long before she leaves 

## 2019-06-21 NOTE — ED Triage Notes (Signed)
Pt reports a foul odor and vaginal discharge. Pt reports discharge has been going on for 2 weeks and the foul odor just started this morning.

## 2019-06-22 ENCOUNTER — Telehealth: Payer: Self-pay | Admitting: Licensed Clinical Social Worker

## 2019-06-22 ENCOUNTER — Ambulatory Visit (INDEPENDENT_AMBULATORY_CARE_PROVIDER_SITE_OTHER): Payer: Medicaid Other | Admitting: Neurology

## 2019-06-22 ENCOUNTER — Encounter: Payer: Self-pay | Admitting: Neurology

## 2019-06-22 VITALS — BP 100/67 | HR 78 | Ht 68.0 in | Wt 164.2 lb

## 2019-06-22 DIAGNOSIS — R55 Syncope and collapse: Secondary | ICD-10-CM

## 2019-06-22 DIAGNOSIS — G450 Vertebro-basilar artery syndrome: Secondary | ICD-10-CM

## 2019-06-22 NOTE — Addendum Note (Signed)
Addended by: Ranae Plumber on: 06/22/2019 02:52 PM   Modules accepted: Orders

## 2019-06-22 NOTE — Patient Instructions (Signed)
I will contact you with appropriate tests Will likely refer you to cardiology. Further recommendations pending results.

## 2019-06-22 NOTE — Telephone Encounter (Signed)
Call placed to patient. LCSW introduced self and explained role at [facility]. Pt was informed of consult to follow up with pt regarding behavioral health and/or resource needs.   Pt shared that she is receiving medication management, psychotherapy, and ACTT services through [employer] [facility] for the last two years. She requested an increase in medication (Abilify) however psychiatrist added a new medication to regimen. Pt is concerned about side effects with future pregnancies.   LCSW provided validation and support. Pt was strongly encouraged to discuss concerns with behavioral health team. LCSW also informed pt that she could complete ROI for LCSW or PCP to discuss treatment plan with [facility]. Pt was appreciative for the call. No additional concerns noted.

## 2019-06-23 LAB — GC/CHLAMYDIA PROBE AMP (~~LOC~~) NOT AT ARMC
Chlamydia: NEGATIVE
Neisseria Gonorrhea: NEGATIVE

## 2019-06-23 LAB — HSV CULTURE AND TYPING

## 2019-06-29 ENCOUNTER — Ambulatory Visit (HOSPITAL_COMMUNITY): Payer: Medicaid Other | Attending: Neurology

## 2019-07-08 ENCOUNTER — Ambulatory Visit: Payer: Medicaid Other | Admitting: Family Medicine

## 2019-09-28 ENCOUNTER — Ambulatory Visit: Attending: Internal Medicine | Primary: Internal Medicine

## 2020-06-11 DIAGNOSIS — J9819 Other pulmonary collapse: Secondary | ICD-10-CM

## 2020-06-11 HISTORY — DX: Other pulmonary collapse: J98.19

## 2020-06-11 HISTORY — PX: OTHER SURGICAL HISTORY: SHX169

## 2020-06-12 ENCOUNTER — Other Ambulatory Visit: Payer: Self-pay

## 2020-06-12 ENCOUNTER — Observation Stay (HOSPITAL_COMMUNITY): Payer: Medicaid Other

## 2020-06-12 ENCOUNTER — Inpatient Hospital Stay (HOSPITAL_COMMUNITY)
Admission: EM | Admit: 2020-06-12 | Discharge: 2020-06-15 | DRG: 200 | Disposition: A | Discharge status: Home / Self Care | Payer: Medicaid Other | Attending: Surgery | Admitting: Surgery

## 2020-06-12 ENCOUNTER — Emergency Department (HOSPITAL_COMMUNITY): Payer: Medicaid Other

## 2020-06-12 ENCOUNTER — Encounter (HOSPITAL_COMMUNITY): Payer: Self-pay | Admitting: Emergency Medicine

## 2020-06-12 DIAGNOSIS — S2249XA Multiple fractures of ribs, unspecified side, initial encounter for closed fracture: Secondary | ICD-10-CM

## 2020-06-12 DIAGNOSIS — F319 Bipolar disorder, unspecified: Secondary | ICD-10-CM | POA: Diagnosis present

## 2020-06-12 DIAGNOSIS — Z825 Family history of asthma and other chronic lower respiratory diseases: Secondary | ICD-10-CM

## 2020-06-12 DIAGNOSIS — J939 Pneumothorax, unspecified: Secondary | ICD-10-CM

## 2020-06-12 DIAGNOSIS — Z20822 Contact with and (suspected) exposure to covid-19: Secondary | ICD-10-CM | POA: Diagnosis present

## 2020-06-12 DIAGNOSIS — S61305A Unspecified open wound of left ring finger with damage to nail, initial encounter: Secondary | ICD-10-CM | POA: Diagnosis present

## 2020-06-12 DIAGNOSIS — Z8249 Family history of ischemic heart disease and other diseases of the circulatory system: Secondary | ICD-10-CM

## 2020-06-12 DIAGNOSIS — Z23 Encounter for immunization: Secondary | ICD-10-CM

## 2020-06-12 DIAGNOSIS — S270XXA Traumatic pneumothorax, initial encounter: Principal | ICD-10-CM

## 2020-06-12 DIAGNOSIS — Z4682 Encounter for fitting and adjustment of non-vascular catheter: Secondary | ICD-10-CM

## 2020-06-12 DIAGNOSIS — F251 Schizoaffective disorder, depressive type: Secondary | ICD-10-CM | POA: Diagnosis present

## 2020-06-12 DIAGNOSIS — W109XXA Fall (on) (from) unspecified stairs and steps, initial encounter: Secondary | ICD-10-CM | POA: Diagnosis present

## 2020-06-12 DIAGNOSIS — S2232XA Fracture of one rib, left side, initial encounter for closed fracture: Secondary | ICD-10-CM | POA: Diagnosis present

## 2020-06-12 HISTORY — DX: Traumatic pneumothorax, initial encounter: S27.0XXA

## 2020-06-12 LAB — CBC WITH DIFFERENTIAL/PLATELET
Abs Immature Granulocytes: 0.04 10*3/uL (ref 0.00–0.07)
Basophils Absolute: 0 10*3/uL (ref 0.0–0.1)
Basophils Relative: 0 %
Eosinophils Absolute: 0.1 10*3/uL (ref 0.0–0.5)
Eosinophils Relative: 1 %
HCT: 36.7 % (ref 36.0–46.0)
Hemoglobin: 11.7 g/dL — ABNORMAL LOW (ref 12.0–15.0)
Immature Granulocytes: 0 %
Lymphocytes Relative: 16 %
Lymphs Abs: 1.5 10*3/uL (ref 0.7–4.0)
MCH: 29 pg (ref 26.0–34.0)
MCHC: 31.9 g/dL (ref 30.0–36.0)
MCV: 90.8 fL (ref 80.0–100.0)
Monocytes Absolute: 0.7 10*3/uL (ref 0.1–1.0)
Monocytes Relative: 7 %
Neutro Abs: 7.2 10*3/uL (ref 1.7–7.7)
Neutrophils Relative %: 76 %
Platelets: 277 10*3/uL (ref 150–400)
RBC: 4.04 MIL/uL (ref 3.87–5.11)
RDW: 13.2 % (ref 11.5–15.5)
WBC: 9.4 10*3/uL (ref 4.0–10.5)
nRBC: 0 % (ref 0.0–0.2)

## 2020-06-12 LAB — RESPIRATORY PANEL BY RT PCR (FLU A&B, COVID)
Influenza A by PCR: NEGATIVE
Influenza B by PCR: NEGATIVE
SARS Coronavirus 2 by RT PCR: NEGATIVE

## 2020-06-12 LAB — PREGNANCY, URINE: Preg Test, Ur: NEGATIVE

## 2020-06-12 LAB — BASIC METABOLIC PANEL
Anion gap: 9 (ref 5–15)
BUN: 9 mg/dL (ref 6–20)
CO2: 22 mmol/L (ref 22–32)
Calcium: 9 mg/dL (ref 8.9–10.3)
Chloride: 105 mmol/L (ref 98–111)
Creatinine, Ser: 0.86 mg/dL (ref 0.44–1.00)
GFR, Estimated: >60 mL/min (ref >60)
Glucose, Bld: 93 mg/dL (ref 70–99)
Potassium: 3.7 mmol/L (ref 3.5–5.1)
Sodium: 136 mmol/L (ref 135–145)

## 2020-06-12 LAB — HIV ANTIBODY (ROUTINE TESTING W REFLEX): HIV Screen 4th Generation wRfx: NONREACTIVE

## 2020-06-12 MED ORDER — SODIUM CHLORIDE 0.9 % IV SOLN: 250.0000 mL | INTRAVENOUS | Status: DC | PRN

## 2020-06-12 MED ORDER — GABAPENTIN 300 MG PO CAPS
300.0000 mg | ORAL_CAPSULE | Freq: Three times a day (TID) | ORAL | Status: DC
  Administered 2020-06-12 – 2020-06-14 (×7): 300 mg via ORAL
  Filled 2020-06-12 (×7): qty 1

## 2020-06-12 MED ORDER — FENTANYL CITRATE (PF) 100 MCG/2ML IJ SOLN
100.0000 ug | Freq: Once | INTRAMUSCULAR | Status: AC
  Administered 2020-06-12: 100 ug via INTRAVENOUS
  Filled 2020-06-12: qty 2

## 2020-06-12 MED ORDER — DIAZEPAM 5 MG PO TABS
5.0000 mg | ORAL_TABLET | Freq: Once | ORAL | Status: AC
  Administered 2020-06-12: 5 mg via ORAL
  Filled 2020-06-12: qty 1

## 2020-06-12 MED ORDER — LIDOCAINE-EPINEPHRINE 1 %-1:100000 IJ SOLN
10.0000 mL | Freq: Once | INTRAMUSCULAR | Status: AC
  Administered 2020-06-12: 10 mL via INTRADERMAL
  Filled 2020-06-12: qty 1

## 2020-06-12 MED ORDER — ONDANSETRON HCL 4 MG/2ML IJ SOLN
4.0000 mg | Freq: Four times a day (QID) | INTRAMUSCULAR | Status: DC | PRN
  Administered 2020-06-13 – 2020-06-14 (×2): 4 mg via INTRAVENOUS
  Filled 2020-06-12 (×2): qty 2

## 2020-06-12 MED ORDER — OXYCODONE HCL 5 MG PO TABS
5.0000 mg | ORAL_TABLET | ORAL | Status: DC | PRN
  Administered 2020-06-12 – 2020-06-15 (×6): 5 mg via ORAL
  Filled 2020-06-12 (×6): qty 1

## 2020-06-12 MED ORDER — IBUPROFEN 800 MG PO TABS
800.0000 mg | ORAL_TABLET | Freq: Three times a day (TID) | ORAL | Status: DC
  Administered 2020-06-12 – 2020-06-14 (×8): 800 mg via ORAL
  Filled 2020-06-12 (×9): qty 1

## 2020-06-12 MED ORDER — DOCUSATE SODIUM 100 MG PO CAPS
100.0000 mg | ORAL_CAPSULE | Freq: Two times a day (BID) | ORAL | Status: DC
  Administered 2020-06-13 – 2020-06-15 (×4): 100 mg via ORAL
  Filled 2020-06-12 (×4): qty 1

## 2020-06-12 MED ORDER — SODIUM CHLORIDE 0.9% FLUSH
3.0000 mL | Freq: Two times a day (BID) | INTRAVENOUS | Status: DC
  Administered 2020-06-13 – 2020-06-15 (×5): 3 mL via INTRAVENOUS

## 2020-06-12 MED ORDER — METHOCARBAMOL 500 MG PO TABS
500.0000 mg | ORAL_TABLET | Freq: Four times a day (QID) | ORAL | Status: DC | PRN
  Administered 2020-06-13 – 2020-06-15 (×2): 500 mg via ORAL
  Filled 2020-06-12 (×2): qty 1

## 2020-06-12 MED ORDER — HYDROMORPHONE HCL 1 MG/ML IJ SOLN
0.5000 mg | INTRAMUSCULAR | Status: DC | PRN
  Administered 2020-06-13 – 2020-06-14 (×2): 0.5 mg via INTRAVENOUS
  Filled 2020-06-12 (×2): qty 1

## 2020-06-12 MED ORDER — TETANUS-DIPHTH-ACELL PERTUSSIS 5-2.5-18.5 LF-MCG/0.5 IM SUSP
0.5000 mL | Freq: Once | INTRAMUSCULAR | Status: AC
  Administered 2020-06-12: 0.5 mL via INTRAMUSCULAR
  Filled 2020-06-12: qty 0.5

## 2020-06-12 MED ORDER — ACETAMINOPHEN 325 MG PO TABS
650.0000 mg | ORAL_TABLET | Freq: Four times a day (QID) | ORAL | Status: DC
  Administered 2020-06-12 – 2020-06-15 (×12): 650 mg via ORAL
  Filled 2020-06-12 (×13): qty 2

## 2020-06-12 MED ORDER — ONDANSETRON 4 MG PO TBDP: 4.0000 mg | ORAL_TABLET | Freq: Four times a day (QID) | ORAL | Status: DC | PRN

## 2020-06-12 MED ORDER — SODIUM CHLORIDE 0.9% FLUSH: 3.0000 mL | INTRAVENOUS | Status: DC | PRN

## 2020-06-12 NOTE — ED Triage Notes (Signed)
Pt states she slipped and fell down steps around 12pm.  C/o approx 2 inch laceration to L lateral side (below axilla) and pulled nail off of L 4th digit.  Denies LOC.  Denies neck and back pain.

## 2020-06-12 NOTE — H&P (Addendum)
Surgical Evaluation  Chief Complaint: fall  HPI: 24 year old woman who sustained a fall down a few stairs around noon today.  She landed on her left and did sustain a laceration on the left side of her chest wall.  She also noted a fourth digit fingernail avulsion and injuries to other fingernails. Did hit her head but no loss of consciousness. Denies headache, vision change or dizziness. Complains of pain located in the left chest which is aching and sharp in quality, some radiation to the back, was sudden onset with the injury and is moderate in severity.  Aggravated by deep breaths and by laying flat.  This was been constant since the event. Currently she is experiencing extreme anxiety about getting an IV started.   No Known Allergies  Past Medical History:  Diagnosis Date  . Asthma   . Bipolar 1 disorder ([facility])   . Depression     Past Surgical History:  Procedure Laterality Date  . NO PAST SURGERIES      Family History  Problem Relation Age of Onset  . Asthma Mother   . Hypertension Father     Social History   Socioeconomic History  . Marital status: Single    Spouse name: Not on file  . Number of children: 0  . Years of education: Not on file  . Highest education level: Some college, no degree  Occupational History  . Not on file  Tobacco Use  . Smoking status: Never Smoker  . Smokeless tobacco: Never Used  Vaping Use  . Vaping Use: Never used  Substance and Sexual Activity  . Alcohol use: No  . Drug use: Yes    Frequency: 4.0 times per week    Types: Marijuana  . Sexual activity: Yes    Birth control/protection: Condom  Other Topics Concern  . Not on file  Social History Narrative  . Not on file   Social Determinants of Health   Financial Resource Strain:   . Difficulty of Paying Living Expenses: Not on file  Food Insecurity:   . Worried About Programme researcher, broadcasting/film/video in the Last Year: Not on file  . Ran Out of Food in the Last Year: Not on file   Transportation Needs:   . Lack of Transportation (Medical): Not on file  . Lack of Transportation (Non-Medical): Not on file  Physical Activity:   . Days of Exercise per Week: Not on file  . Minutes of Exercise per Session: Not on file  Stress:   . Feeling of Stress : Not on file  Social Connections:   . Frequency of Communication with Friends and Family: Not on file  . Frequency of Social Gatherings with Friends and Family: Not on file  . Attends Religious Services: Not on file  . Active Member of Clubs or Organizations: Not on file  . Attends Banker Meetings: Not on file  . Marital Status: Not on file    No current facility-administered medications on file prior to encounter.   Current Outpatient Medications on File Prior to Encounter  Medication Sig Dispense Refill  . ARIPiprazole ER (ABILIFY MAINTENA) 400 MG SRER injection Inject 400 mg into the muscle every 28 (twenty-eight) days.      Review of Systems: a complete, 10pt review of systems was completed with pertinent positives and negatives as documented in the HPI  Physical Exam: Vitals:   06/12/20 1344  BP: (!) 105/92  Pulse: (!) 101  Resp: 12  Temp: 98.7  F (37.1 C)  SpO2: 100%   Gen: A&Ox3, no distress  Eyes: lids and conjunctivae normal, no icterus. Pupils equally round and reactive to light. Small contusion along right brow Neck: supple without mass or thyromegaly, no cspine tenderness, trachea midline no crepitus or hematoma Chest: respiratory effort is normal. No crepitus, tender along left chest wall, there is a 2cm vertical laceration with exposed subcu fat in the mid-axillary line, no obvious air leak here  Cardiovascular: RRR with palpable distal pulses, no pedal edema Gastrointestinal: soft, nondistended, nontender. No mass, hepatomegaly or splenomegaly. No hernia. Lymphatic: no lymphadenopathy in the neck or groin Muscoloskeletal: no clubbing or cyanosis of the fingers.  Strength is  symmetrical throughout.  Range of motion of bilateral upper and lower extremities normal without pain, crepitation or contracture. Neuro: GCS 15. cranial nerves grossly intact.  Sensation intact to light touch diffusely. Psych: appropriate mood and affect, normal insight/judgment intact  Skin: warm and dry   CBC Latest Ref Rng & Units 05/05/2019 10/31/2018 09/05/2018  WBC 4.0 - 10.5 K/uL 5.3 6.0 -  Hemoglobin 12.0 - 15.0 g/dL 11.8(L) 12.1 13.3  Hematocrit 36 - 46 % 37.2 35.5 39.0  Platelets 150 - 400 K/uL 230 284 -    CMP Latest Ref Rng & Units 05/27/2019 05/05/2019 10/31/2018  Glucose 65 - 99 mg/dL 80 94 696(V)  BUN 6 - 20 mg/dL 11 13 10   Creatinine 0.57 - 1.00 mg/dL 8.93) 8.10(F  Sodium 134 - 144 mmol/L 141 139 141  Potassium 3.5 - 5.2 mmol/L 4.1 4.0 4.3  Chloride 96 - 106 mmol/L 110(H) 108 103  CO2 20 - 29 mmol/L 23 22 24   Calcium 8.7 - 10.2 mg/dL 9.1 9.4 9.4  Total Protein 6.5 - 8.1 g/dL - 7.1 -  Total Bilirubin 0.3 - 1.2 mg/dL - 0.8 -  Alkaline Phos 38 - 126 U/L - 53 -  AST 15 - 41 U/L - 19 -  ALT 0 - 44 U/L - 15 -    No results found for: INR, PROTIME  Imaging: DG Chest Port 1 View  Result Date: 06/12/2020 CLINICAL DATA:  Recent fall with left-sided chest pain, initial encounter EXAM: PORTABLE CHEST 1 VIEW COMPARISON:  05/05/2019 FINDINGS: Large left pneumothorax is noted. There is approximately 3.7 cm of excursion superiorly in the apex. Left seventh rib fracture is noted laterally. No other definitive fracture is seen. IMPRESSION: Left seventh rib fracture with associated large pneumothorax. Critical Value/emergent results were called by telephone at the time of interpretation on 06/12/2020 at 4:22 pm to Dr. 07/05/2019 , who verbally acknowledged these results. Electronically Signed   By: 06/14/2020 M.D.   On: 06/12/2020 16:23     A/P: 24yo s/p fall down stairs. Has had a chest xray; I do not think additional imaging is warranted at this time given benign abdominal exam and  normal neurologic status.   Left 7th rib fx with large pneumothorax, lateral chest wall laceration: chest tube to -20 (Dr. Alcide Clever), multimodal pain control, aggressive pulm toilet, repeat cxr in AM. Currently, the patient is not even allowing an IV to be placed.   Admit for observation/ chest tube management to med-surg  Patient Active Problem List   Diagnosis Date Noted  . Pneumothorax, traumatic 06/12/2020  . Schizoaffective disorder, depressive type ([facility]) 07/19/2016       06/14/2020, MD [facility]  See AMION to contact appropriate on-call provider

## 2020-06-12 NOTE — ED Provider Notes (Signed)
[facility] Provider Note   CSN: [id number] Arrival date & time: 06/12/20  1329     History Chief Complaint  Patient presents with  . Laceration  . Fall    Melissa [name] is a 24 y.o. female.  24 yo F with a cc of a fall.  This occurred about lunchtime today.  The patient fell down a few stairs and she thinks she got her left side stuck on a nail because she had a laceration in her left side.  She is also complaining of her fingernails falling off.  She had fake nails on her fingers and lost a couple during the fall.  Had loss of the nail to the left fourth digit.  The history is provided by the patient.  Laceration Associated symptoms: no fever   Fall Associated symptoms include chest pain. Pertinent negatives include no headaches and no shortness of breath.  Chest Pain Pain location:  L chest Pain quality: aching and sharp   Pain radiates to:  Does not radiate Pain severity:  Moderate Onset quality:  Sudden Duration:  2 hours Timing:  Constant Chronicity:  New Context: breathing   Relieved by:  Nothing Worsened by:  Nothing Ineffective treatments:  None tried Associated symptoms: no dizziness, no fever, no headache, no nausea, no palpitations, no shortness of breath and no vomiting        Past Medical History:  Diagnosis Date  . Asthma   . Bipolar 1 disorder (HCC)   . Depression     Patient Active Problem List   Diagnosis Date Noted  . Pneumothorax, traumatic 06/12/2020  . Schizoaffective disorder, depressive type (HCC) 07/19/2016    Past Surgical History:  Procedure Laterality Date  . NO PAST SURGERIES       OB History    Gravida  1   Para  0   Term  0   Preterm  0   AB  1   Living  0     SAB  1   TAB  0   Ectopic  0   Multiple  0   Live Births  0           Family History  Problem Relation Age of Onset  . Asthma Mother   . Hypertension Father     Social History   Tobacco Use   . Smoking status: Never Smoker  . Smokeless tobacco: Never Used  Vaping Use  . Vaping Use: Never used  Substance Use Topics  . Alcohol use: No  . Drug use: Yes    Frequency: 4.0 times per week    Types: Marijuana    Home Medications Prior to Admission medications   Medication Sig Start Date End Date Taking? Authorizing Provider  ARIPiprazole ER (ABILIFY MAINTENA) 400 MG SRER injection Inject 400 mg into the muscle every 28 (twenty-eight) days.    [provider]    Allergies    Patient has no known allergies.  Review of Systems   Review of Systems  Constitutional: Negative for chills and fever.  HENT: Negative for congestion and rhinorrhea.   Eyes: Negative for redness and visual disturbance.  Respiratory: Negative for shortness of breath and wheezing.   Cardiovascular: Positive for chest pain. Negative for palpitations.  Gastrointestinal: Negative for nausea and vomiting.  Genitourinary: Negative for dysuria and urgency.  Musculoskeletal: Negative for arthralgias and myalgias.  Skin: Negative for pallor and wound.  Neurological: Negative for dizziness and headaches.  Physical Exam Updated Vital Signs BP 119/69 (BP Location: Right Arm)   Pulse 96   Temp 98.5 F (36.9 C) (Oral)   Resp 18   LMP 06/02/2020   SpO2 97%   Physical Exam Vitals and nursing note reviewed.  Constitutional:      General: She is not in acute distress.    Appearance: She is well-developed. She is not diaphoretic.  HENT:     Head: Normocephalic and atraumatic.  Eyes:     Pupils: Pupils are equal, round, and reactive to light.  Cardiovascular:     Rate and Rhythm: Normal rate and regular rhythm.     Heart sounds: No murmur heard.  No friction rub. No gallop.   Pulmonary:     Effort: Pulmonary effort is normal.     Breath sounds: No wheezing or rales.  Abdominal:     General: There is no distension.     Palpations: Abdomen is soft.     Tenderness: There is no abdominal  tenderness.  Musculoskeletal:        General: No tenderness.     Cervical back: Normal range of motion and neck supple.     Comments: Laceration along the anterior axillary line about ribs 2 through 4.  Mild tenderness about the left posterior rib angles.  Avulsion of some cosmetic nails, left fourth digit with intact nail bed with otherwise loss of the nail.  No laceration.  Skin:    General: Skin is warm and dry.  Neurological:     Mental Status: She is alert and oriented to person, place, and time.  Psychiatric:        Behavior: Behavior normal.     ED Results / Procedures / Treatments   Labs (all labs ordered are listed, but only abnormal results are displayed) Labs Reviewed  CBC WITH DIFFERENTIAL/PLATELET - Abnormal; Notable for the following components:      Result Value   Hemoglobin 11.7 (*)    All other components within normal limits  RESPIRATORY PANEL BY RT PCR (FLU A&B, COVID)  PREGNANCY, URINE  BASIC METABOLIC PANEL  HIV ANTIBODY (ROUTINE TESTING W REFLEX)  CBC  BASIC METABOLIC PANEL    EKG None  Radiology DG Chest Port 1 View  Result Date: 06/12/2020 CLINICAL DATA:  Recent fall with left-sided chest pain, initial encounter EXAM: PORTABLE CHEST 1 VIEW COMPARISON:  05/05/2019 FINDINGS: Large left pneumothorax is noted. There is approximately 3.7 cm of excursion superiorly in the apex. Left seventh rib fracture is noted laterally. No other definitive fracture is seen. IMPRESSION: Left seventh rib fracture with associated large pneumothorax. Critical Value/emergent results were called by telephone at the time of interpretation on 06/12/2020 at 4:22 pm to Dr. Melene Plan , who verbally acknowledged these results. Electronically Signed   By: Alcide Clever M.D.   On: 06/12/2020 16:23    Procedures CHEST TUBE INSERTION  Date/Time: 06/12/2020 6:54 PM Performed by: Melene Plan, DO Authorized by: Melene Plan, DO   Consent:    Consent obtained:  Verbal   Consent given  by:  Patient   Risks discussed:  Bleeding, incomplete drainage and infection   Alternatives discussed:  No treatment, delayed treatment and alternative treatment Pre-procedure details:    Skin preparation:  ChloraPrep   Preparation: Patient was prepped and draped in the usual sterile fashion   Sedation:    Sedation type:  Anxiolysis Anesthesia (see MAR for exact dosages):    Anesthesia method:  Local infiltration  Local anesthetic:  Lidocaine 2% WITH epi Procedure details:    Placement location:  L anterior   Scalpel size:  11   Tube size (French): 14.   Ultrasound guidance: no     Tension pneumothorax: no     Tube connected to:  Water seal   Drainage characteristics:  Air only   Suture material:  2-0 silk   Dressing:  4x4 sterile gauze Post-procedure details:    Post-insertion x-ray findings: tube in good position     Patient tolerance of procedure:  Tolerated well, no immediate complications .Marland KitchenLaceration Repair  Date/Time: 06/12/2020 6:55 PM Performed by: Melene Plan, DO Authorized by: Melene Plan, DO   Consent:    Consent obtained:  Verbal   Consent given by:  Patient   Risks discussed:  Infection, pain, poor cosmetic result and poor wound healing   Alternatives discussed:  No treatment, delayed treatment and observation Anesthesia (see MAR for exact dosages):    Anesthesia method:  Local infiltration   Local anesthetic:  Lidocaine 2% WITH epi Laceration details:    Location:  Trunk   Trunk location:  L axilla   Length (cm):  2.8 Repair type:    Repair type:  Simple Pre-procedure details:    Preparation:  Patient was prepped and draped in usual sterile fashion Exploration:    Hemostasis achieved with:  Epinephrine and direct pressure   Wound exploration: entire depth of wound probed and visualized     Wound extent: no nerve damage noted and no vascular damage noted     Contaminated: no   Treatment:    Area cleansed with:  Saline   Amount of cleaning:  Standard    Irrigation solution:  Sterile saline   Irrigation volume:  40   Irrigation method:  Syringe Skin repair:    Repair method:  Sutures   Suture size:  3-0   Suture material:  Nylon   Suture technique:  Simple interrupted   Number of sutures:  4 Approximation:    Approximation:  Close Post-procedure details:    Dressing:  Open (no dressing)   Patient tolerance of procedure:  Tolerated well, no immediate complications   (including critical care time)  Medications Ordered in ED Medications  sodium chloride flush (NS) 0.9 % injection 3 mL (has no administration in time range)  sodium chloride flush (NS) 0.9 % injection 3 mL (has no administration in time range)  0.9 %  sodium chloride infusion (has no administration in time range)  ondansetron (ZOFRAN-ODT) disintegrating tablet 4 mg (has no administration in time range)    Or  ondansetron (ZOFRAN) injection 4 mg (has no administration in time range)  docusate sodium (COLACE) capsule 100 mg (has no administration in time range)  acetaminophen (TYLENOL) tablet 650 mg (650 mg Oral Given 06/12/20 1814)  gabapentin (NEURONTIN) capsule 300 mg (300 mg Oral Given 06/12/20 1814)  HYDROmorphone (DILAUDID) injection 0.5 mg (has no administration in time range)  ibuprofen (ADVIL) tablet 800 mg (800 mg Oral Given 06/12/20 1814)  methocarbamol (ROBAXIN) tablet 500 mg (has no administration in time range)  oxyCODONE (Oxy IR/ROXICODONE) immediate release tablet 5 mg (has no administration in time range)  lidocaine-EPINEPHrine (XYLOCAINE W/EPI) 1 %-1:100000 (with pres) injection 10 mL (10 mLs Intradermal Given 06/12/20 1603)  fentaNYL (SUBLIMAZE) injection 100 mcg (100 mcg Intravenous Given 06/12/20 1812)  Tdap (BOOSTRIX) injection 0.5 mL (0.5 mLs Intramuscular Given 06/12/20 1815)  diazepam (VALIUM) tablet 5 mg (5 mg Oral Given by Other 06/12/20  1815)    ED Course  I have reviewed the triage vital signs and the nursing notes.  Pertinent labs &  imaging results that were available during my care of the patient were reviewed by me and considered in my medical decision making (see chart for details).    MDM Rules/Calculators/A&P                          24 yo F with a chief complaints of a fall.  The patient lost her balance while walking down the stairs and landed on her left side.  Complaining of left-sided chest pain that is worse with deep breathing.  She suffered a laceration to the area as well.  She also had some injuries to most of her fingernails.  Plain films viewed by me with left-sided pneumothorax.  Will discuss with trauma.  Tetanus updated.  Of note the patient also thinks that she is newly pregnant.  Think she is 4 weeks late on her menstrual cycle.  Chest tube placed at bedside.  Trauma admit.   CRITICAL CARE Performed by: Rae Roam   Total critical care time: 35 minutes  Critical care time was exclusive of separately billable procedures and treating other patients.  Critical care was necessary to treat or prevent imminent or life-threatening deterioration.  Critical care was time spent personally by me on the following activities: development of treatment plan with patient and/or surrogate as well as nursing, discussions with consultants, evaluation of patient's response to treatment, examination of patient, obtaining history from patient or surrogate, ordering and performing treatments and interventions, ordering and review of laboratory studies, ordering and review of radiographic studies, pulse oximetry and re-evaluation of patient's condition.  The patients results and plan were reviewed and discussed.   Any x-rays performed were independently reviewed by myself.   Differential diagnosis were considered with the presenting HPI.  Medications  sodium chloride flush (NS) 0.9 % injection 3 mL (has no administration in time range)  sodium chloride flush (NS) 0.9 % injection 3 mL (has no administration  in time range)  0.9 %  sodium chloride infusion (has no administration in time range)  ondansetron (ZOFRAN-ODT) disintegrating tablet 4 mg (has no administration in time range)    Or  ondansetron (ZOFRAN) injection 4 mg (has no administration in time range)  docusate sodium (COLACE) capsule 100 mg (has no administration in time range)  acetaminophen (TYLENOL) tablet 650 mg (650 mg Oral Given 06/12/20 1814)  gabapentin (NEURONTIN) capsule 300 mg (300 mg Oral Given 06/12/20 1814)  HYDROmorphone (DILAUDID) injection 0.5 mg (has no administration in time range)  ibuprofen (ADVIL) tablet 800 mg (800 mg Oral Given 06/12/20 1814)  methocarbamol (ROBAXIN) tablet 500 mg (has no administration in time range)  oxyCODONE (Oxy IR/ROXICODONE) immediate release tablet 5 mg (has no administration in time range)  lidocaine-EPINEPHrine (XYLOCAINE W/EPI) 1 %-1:100000 (with pres) injection 10 mL (10 mLs Intradermal Given 06/12/20 1603)  fentaNYL (SUBLIMAZE) injection 100 mcg (100 mcg Intravenous Given 06/12/20 1812)  Tdap (BOOSTRIX) injection 0.5 mL (0.5 mLs Intramuscular Given 06/12/20 1815)  diazepam (VALIUM) tablet 5 mg (5 mg Oral Given by Other 06/12/20 1815)    Vitals:   06/12/20 1615 06/12/20 1630 06/12/20 1649 06/12/20 1853  BP: 111/81 107/74  119/69  Pulse: 94 90  96  Resp:    18  Temp:    98.5 F (36.9 C)  TempSrc:    Oral  SpO2: 100%  100% 100% 97%    Final diagnoses:  Traumatic pneumothorax, initial encounter    Admission/ observation were discussed with the admitting physician, patient and/or family and they are comfortable with the plan.    Final Clinical Impression(s) / ED Diagnoses Final diagnoses:  Traumatic pneumothorax, initial encounter    Rx / DC Orders ED Discharge Orders    None       Melene Plan, DO 06/12/20 1857

## 2020-06-12 NOTE — ED Notes (Signed)
Pt panicking on the room and no letting RN's to start IV side or get blood from her, ED provider notified.

## 2020-06-13 ENCOUNTER — Observation Stay (HOSPITAL_COMMUNITY): Payer: Medicaid Other

## 2020-06-13 DIAGNOSIS — Z8249 Family history of ischemic heart disease and other diseases of the circulatory system: Secondary | ICD-10-CM | POA: Diagnosis not present

## 2020-06-13 DIAGNOSIS — S2232XA Fracture of one rib, left side, initial encounter for closed fracture: Secondary | ICD-10-CM | POA: Diagnosis present

## 2020-06-13 DIAGNOSIS — S270XXA Traumatic pneumothorax, initial encounter: Secondary | ICD-10-CM | POA: Diagnosis not present

## 2020-06-13 DIAGNOSIS — Z825 Family history of asthma and other chronic lower respiratory diseases: Secondary | ICD-10-CM | POA: Diagnosis not present

## 2020-06-13 DIAGNOSIS — F251 Schizoaffective disorder, depressive type: Secondary | ICD-10-CM | POA: Diagnosis present

## 2020-06-13 DIAGNOSIS — S2249XA Multiple fractures of ribs, unspecified side, initial encounter for closed fracture: Secondary | ICD-10-CM | POA: Diagnosis not present

## 2020-06-13 DIAGNOSIS — Z20822 Contact with and (suspected) exposure to covid-19: Secondary | ICD-10-CM | POA: Diagnosis present

## 2020-06-13 DIAGNOSIS — Z23 Encounter for immunization: Secondary | ICD-10-CM | POA: Diagnosis not present

## 2020-06-13 DIAGNOSIS — F319 Bipolar disorder, unspecified: Secondary | ICD-10-CM | POA: Diagnosis present

## 2020-06-13 DIAGNOSIS — S61305A Unspecified open wound of left ring finger with damage to nail, initial encounter: Secondary | ICD-10-CM | POA: Diagnosis present

## 2020-06-13 DIAGNOSIS — W109XXA Fall (on) (from) unspecified stairs and steps, initial encounter: Secondary | ICD-10-CM | POA: Diagnosis present

## 2020-06-13 LAB — CBC
HCT: 33.8 % — ABNORMAL LOW (ref 36.0–46.0)
Hemoglobin: 10.8 g/dL — ABNORMAL LOW (ref 12.0–15.0)
MCH: 29.2 pg (ref 26.0–34.0)
MCHC: 32 g/dL (ref 30.0–36.0)
MCV: 91.4 fL (ref 80.0–100.0)
Platelets: 255 10*3/uL (ref 150–400)
RBC: 3.7 MIL/uL — ABNORMAL LOW (ref 3.87–5.11)
RDW: 13.5 % (ref 11.5–15.5)
WBC: 7.3 10*3/uL (ref 4.0–10.5)
nRBC: 0 % (ref 0.0–0.2)

## 2020-06-13 LAB — BASIC METABOLIC PANEL
Anion gap: 8 (ref 5–15)
BUN: 6 mg/dL (ref 6–20)
CO2: 25 mmol/L (ref 22–32)
Calcium: 8.7 mg/dL — ABNORMAL LOW (ref 8.9–10.3)
Chloride: 105 mmol/L (ref 98–111)
Creatinine, Ser: 0.85 mg/dL (ref 0.44–1.00)
GFR, Estimated: >60 mL/min (ref >60)
Glucose, Bld: 84 mg/dL (ref 70–99)
Potassium: 3.6 mmol/L (ref 3.5–5.1)
Sodium: 138 mmol/L (ref 135–145)

## 2020-06-13 MED ORDER — ENOXAPARIN SODIUM 30 MG/0.3ML ~~LOC~~ SOLN
30.0000 mg | Freq: Two times a day (BID) | SUBCUTANEOUS | Status: DC
  Administered 2020-06-13 – 2020-06-15 (×4): 30 mg via SUBCUTANEOUS
  Filled 2020-06-13 (×4): qty 0.3

## 2020-06-13 NOTE — ED Notes (Signed)
Ordered breakfast 

## 2020-06-13 NOTE — ED Notes (Signed)
Lunch Tray Ordered @ 1050. 

## 2020-06-13 NOTE — Progress Notes (Signed)
Subjective: CC: Notes some left sided chest wall pain around where the chest tube is located. No other areas of pain. Pain well controlled on current medication regimen. No sob. On RA. Tolerating cld without abdominal pain, n/v. No extremity pain or other areas of pain. No other complaints. Has not gotten out of bed. Voiding.   Lives at home alone but does have family in the area that could help after discharge. She reports she is not currently working. She does not take daily medications. No alcohol, tobacco or IDU.  ROS: See above, otherwise other systems negative    Objective: Vital signs in last 24 hours: Temp:  [97.8 F (36.6 C)-98.7 F (37.1 C)] 97.8 F (36.6 C) (10/18 0802) Pulse Rate:  [64-101] 66 (10/18 0700) Resp:  [12-28] 18 (10/18 0800) BP: (100-119)/(65-92) 101/68 (10/18 0800) SpO2:  [96 %-100 %] 100 % (10/18 0802) FiO2 (%):  [21 %] 21 % (10/17 1649)    Intake/Output from previous day: No intake/output data recorded. Intake/Output this shift: No intake/output data recorded.  PE: Gen:  Alert, NAD, pleasant HEENT: EOM's intact, pupils equal and round Card:  RRR, no M/G/R heard Pulm:  CTAB, no W/R/R, effort normal. On RA. CT in place, dressing c/d/i. Laceration dressing c/d/i. CT on -20. No output. No air leak.  Abd: Soft, NT/ND, +BS Ext:  Active rom of BUE and BLE's without pain. No edema, or tenderness BUE/BLE Psych: A&Ox3  Skin: no rashes noted, warm and dry   Lab Results:  Recent Labs    06/12/20 1805 06/13/20 0452  WBC 9.4 7.3  HGB 11.7* 10.8*  HCT 36.7 33.8*  PLT 277 255   BMET Recent Labs    06/12/20 1805 06/13/20 0452  NA 136 138  K 3.7 3.6  CL 105 105  CO2 22 25  GLUCOSE 93 84  BUN 9 6  CREATININE 0.86 0.85  CALCIUM 9.0 8.7*   PT/INR No results for input(s): LABPROT, INR in the last 72 hours. CMP     Component Value Date/Time   NA 138 06/13/2020 0452   NA 141 05/27/2019 1704   K 3.6 06/13/2020 0452   CL 105 06/13/2020  0452   CO2 25 06/13/2020 0452   GLUCOSE 84 06/13/2020 0452   BUN 6 06/13/2020 0452   BUN 11 05/27/2019 1704   CREATININE 0.85 06/13/2020 0452   CALCIUM 8.7 (L) 06/13/2020 0452   PROT 7.1 05/05/2019 0950   ALBUMIN 3.9 05/05/2019 0950   AST 19 05/05/2019 0950   ALT 15 05/05/2019 0950   ALKPHOS 53 05/05/2019 0950   BILITOT 0.8 05/05/2019 0950   GFRNONAA >60 06/13/2020 0452   GFRAA 94 05/27/2019 1704   Lipase     Component Value Date/Time   LIPASE 34 05/05/2019 0950       Studies/Results: DG Chest Port 1 View  Result Date: 06/12/2020 CLINICAL DATA:  Chest tube placement EXAM: PORTABLE CHEST 1 VIEW COMPARISON:  06/12/2020 FINDINGS: Left chest tube is been placed. Re-expansion of the left lung. No residual pneumothorax. Left base atelectasis. Right lung clear. Heart is normal size. IMPRESSION: Re-expansion of the left lung after insertion of left chest tube. Left base atelectasis. Electronically Signed   By: Charlett Nose M.D.   On: 06/12/2020 19:17   DG Chest Port 1 View  Result Date: 06/12/2020 CLINICAL DATA:  Recent fall with left-sided chest pain, initial encounter EXAM: PORTABLE CHEST 1 VIEW COMPARISON:  05/05/2019 FINDINGS: Large left pneumothorax is  noted. There is approximately 3.7 cm of excursion superiorly in the apex. Left seventh rib fracture is noted laterally. No other definitive fracture is seen. IMPRESSION: Left seventh rib fracture with associated large pneumothorax. Critical Value/emergent results were called by telephone at the time of interpretation on 06/12/2020 at 4:22 pm to Dr. Melene Plan , who verbally acknowledged these results. Electronically Signed   By: Alcide Clever M.D.   On: 06/12/2020 16:23    Anti-infectives: Anti-infectives (From admission, onward)   None       Assessment/Plan Fall down stairs Left rib fx w/ PTX - AM CXR without obvious PTX. Await final read. Keep CT on -20 as it has been < 24 hours. AM CXR. Multimodal pain control. Pulm toilet.  PT Chest wall laceration - s/p repair by EDP FEN - Reg VTE - SCDs, start Lovenox ID - None Foley - None Dispo - AM CXR. CT to suction. PT   LOS: 0 days    Jacinto Halim , [name] Surgery 06/13/2020, 8:09 AM Please see Amion for pager number during day hours 7:00am-4:30pm

## 2020-06-13 NOTE — ED Notes (Signed)
Pt provided with ice pack for swelling to right eye. Per request.

## 2020-06-13 NOTE — ED Notes (Signed)
PT at bedside.

## 2020-06-13 NOTE — ED Notes (Signed)
Offered to place ice pack on her right eye , patient refused.

## 2020-06-13 NOTE — Progress Notes (Signed)
Pt was seen for initial mobility training, and was confined to remain connected to the suction on her chest tube. Pt is able to get up and down with minor contact, but is in some pain.  Her plan is to focus on gait and to stair training as she is able, depending on the attachment of the chest tube.   06/13/20 1300  PT Visit Information  Last PT Received On 06/12/20  Assistance Needed +1  History of Present Illness 24 yo female with reported fall on the stairs presented to ED with L 7th rib fracture and pneumothorax, chest tube inserted.   Has 4th fingernail avulsion, as well as other fingernail injuries.  PMHx:  bipolar disorder, depression, asthma, schizoaffective disorder,   Precautions  Precautions Fall  Precaution Comments monitor O2 sats, HR  Restrictions  Weight Bearing Restrictions No  Other Position/Activity Restrictions L 4th finger nail avulsion  Home Living  Family/patient expects to be discharged to: Private residence  Living Arrangements Alone  Available Help at Discharge Family;Available PRN/intermittently  Type of Home House  Home Access Stairs to enter  Entrance Stairs-Number of Steps flight  Entrance Stairs-Rails Can reach both;Right;Left  Home Layout One level  Information systems manager  Home Equipment None  Additional Comments pt is up walking independently with no AD normally  Prior Function  Level of Independence Independent  Communication  Communication No difficulties  Pain Assessment  Pain Assessment Faces  Faces Pain Scale 4  Pain Location L side primarily with new chest tube  Pain Descriptors / Indicators Operative site guarding  Pain Intervention(s) Monitored during session;Premedicated before session  Cognition  Arousal/Alertness Awake/alert  Behavior During Therapy WFL for tasks assessed/performed  Overall Cognitive Status Within Functional Limits for tasks assessed  General Comments pt is able to discuss history  Upper Extremity Assessment  Upper  Extremity Assessment Overall WFL for tasks assessed  Lower Extremity Assessment  Lower Extremity Assessment Overall WFL for tasks assessed  Cervical / Trunk Assessment  Cervical / Trunk Assessment Other exceptions (L side chest tube and )  Cervical / Trunk Exceptions chest tube  Bed Mobility  Overal bed mobility Needs Assistance  Bed Mobility Supine to Sit;Sit to Supine  Supine to sit Supervision  Sit to supine Supervision  General bed mobility comments supervision and assistance for lines  Transfers  Overall transfer level Needs assistance  Equipment used None  Transfers Sit to/from Stand  Sit to Stand Min guard  General transfer comment min guard due to lines  Ambulation/Gait  Ambulation/Gait assistance Min guard  Gait Distance (Feet) 10 Feet  Assistive device 1 person hand held assist  Gait Pattern/deviations Step-to pattern;Decreased stride length;Narrow base of support  General Gait Details sidestepping bedside due to her connection to chest tube suction and being unable to seal for longer walk  Gait velocity reduced  Gait velocity interpretation <1.31 ft/sec, indicative of household ambulator  Balance  Overall balance assessment Needs assistance  Sitting-balance support Feet supported  Sitting balance-Leahy Scale Good  Standing balance-Leahy Scale Fair  General Comments  General comments (skin integrity, edema, etc.) Pt was able to walk sidestepping with maintenenance of balance with min guard on UE's   PT - End of Session  Activity Tolerance Patient tolerated treatment well;Patient limited by pain  Patient left in bed;with call bell/phone within reach  Nurse Communication Mobility status  PT Assessment  PT Recommendation/Assessment Patient needs continued PT services  PT Visit Diagnosis Unsteadiness on feet (R26.81);Pain  Pain - Right/Left  Left  Pain - part of body  (ribcage)  PT Problem List Decreased activity tolerance;Decreased balance;Cardiopulmonary status  limiting activity  Barriers to Discharge Inaccessible home environment;Decreased caregiver support  PT Plan  PT Frequency (ACUTE ONLY) Min 3X/week  PT Treatment/Interventions (ACUTE ONLY) DME instruction;Gait training;Stair training;Functional mobility training;Therapeutic activities;Therapeutic exercise;Balance training;Neuromuscular re-education;Patient/family education  AM-PAC PT "6 Clicks" Mobility Outcome Measure (Version 2)  Help needed turning from your back to your side while in a flat bed without using bedrails? 4  Help needed moving from lying on your back to sitting on the side of a flat bed without using bedrails? 3  Help needed moving to and from a bed to a chair (including a wheelchair)? 3  Help needed standing up from a chair using your arms (e.g., wheelchair or bedside chair)? 3  Help needed to walk in hospital room? 3  Help needed climbing 3-5 steps with a railing?  3  6 Click Score 19  Consider Recommendation of Discharge To: Home with [facility]  PT Recommendation  Follow Up Recommendations Home health PT;Supervision for mobility/OOB  PT equipment None recommended by PT  Individuals Consulted  Consulted and Agree with Results and Recommendations Patient  Acute Rehab PT Goals  Patient Stated Goal to get up and walk longer trips  PT Goal Formulation With patient  Time For Goal Achievement 06/20/20  Potential to Achieve Goals Good  PT Time Calculation  PT Start Time (ACUTE ONLY) 1049  PT Stop Time (ACUTE ONLY) 1112  PT Time Calculation (min) (ACUTE ONLY) 23 min  PT General Charges  $$ ACUTE PT VISIT 1 Visit  PT Evaluation  $PT Eval Moderate Complexity 1 Mod  PT Treatments  $Gait Training 8-22 mins  Written Expression  Dominant Hand Right    Samul Dada, PT MS Acute Rehab Dept. Number: [facility] [id number] and [facility] [phone]

## 2020-06-13 NOTE — ED Notes (Signed)
Document Chest tube prior to this RN arrival. Chest tube left side of chest to water seal connected to low intermittent suction

## 2020-06-14 ENCOUNTER — Inpatient Hospital Stay (HOSPITAL_COMMUNITY): Payer: Medicaid Other

## 2020-06-14 NOTE — Progress Notes (Signed)
Pt arrived from ED via wheelchair, respirations even and unlabored on room air. She walked to bed with stand by assist and was steady on her feet. Chest tube to left lateral ribcage; dressing clean dry and intact. Chest tube to 20 mm H2O suction and low intermittent wall suction. She has a 2 cm laceration to her left lateral ribcage above the chest tube site that it clean, dry, and approximated with 4 sutures in place. Dressing is dry gauze and it is CDI. Pt has right knee abrasion with transparent dressing, CDI but with serous drainage pooling inside dressing. Her right eye is bruised and slightly swollen. She has an abrasion to her anterior lower arm that is dry and scabbed. There is also and old, well healed keloid type scar to her right posterior bicept. She has a 20 g LAC PIV. I placed SCD's to her bilateral lower extremities. Oriented to room and protocols. Callbell in reach, encouraged to call with any needs. 06/14/2020 @ 0305 Manson Allan, RN

## 2020-06-14 NOTE — Progress Notes (Signed)
Subjective: No issues.  Denies SOB.  Wanting to shower. Eating well.  ROS: See above, otherwise other systems negative  Objective: Vital signs in last 24 hours: Temp:  [97.7 F (36.5 C)-97.9 F (36.6 C)] 97.7 F (36.5 C) (10/19 0305) Pulse Rate:  [59-146] 68 (10/19 0305) Resp:  [13-25] 17 (10/19 0305) BP: (98-117)/(58-87) 107/75 (10/19 0305) SpO2:  [98 %-100 %] 100 % (10/19 0305) Weight:  [74.8 kg-78.7 kg] 78.7 kg (10/19 0524) Last BM Date: 06/12/20  Intake/Output from previous day: 10/18 0701 - 10/19 0700 In: -  Out: 711 [Urine:700; Chest Tube:11] Intake/Output this shift: No intake/output data recorded.  PE: Gen: NAD Heart: regular Lungs: CTAB, chest tube in place with no air leak.  Minimal output in cannister.  CT site is clean and intact, laceration site c/d/i Abd: soft, +BS, ND Ext: MAE  Lab Results:  Recent Labs    06/12/20 1805 06/13/20 0452  WBC 9.4 7.3  HGB 11.7* 10.8*  HCT 36.7 33.8*  PLT 277 255   BMET Recent Labs    06/12/20 1805 06/13/20 0452  NA 136 138  K 3.7 3.6  CL 105 105  CO2 22 25  GLUCOSE 93 84  BUN 9 6  CREATININE 0.86 0.85  CALCIUM 9.0 8.7*   PT/INR No results for input(s): LABPROT, INR in the last 72 hours. CMP     Component Value Date/Time   NA 138 06/13/2020 0452   NA 141 05/27/2019 1704   K 3.6 06/13/2020 0452   CL 105 06/13/2020 0452   CO2 25 06/13/2020 0452   GLUCOSE 84 06/13/2020 0452   BUN 6 06/13/2020 0452   BUN 11 05/27/2019 1704   CREATININE 0.85 06/13/2020 0452   CALCIUM 8.7 (L) 06/13/2020 0452   PROT 7.1 05/05/2019 0950   ALBUMIN 3.9 05/05/2019 0950   AST 19 05/05/2019 0950   ALT 15 05/05/2019 0950   ALKPHOS 53 05/05/2019 0950   BILITOT 0.8 05/05/2019 0950   GFRNONAA >60 06/13/2020 0452   GFRAA 94 05/27/2019 1704   Lipase     Component Value Date/Time   LIPASE 34 05/05/2019 0950       Studies/Results: DG Chest Port 1 View  Result Date: 06/13/2020 CLINICAL DATA:  Fall, left chest  pain EXAM: PORTABLE CHEST 1 VIEW COMPARISON:  06/12/2020 FINDINGS: Small bore mid lung zone lateral pigtail chest tube is again seen. Left pneumothorax has been evacuated and there is no residual pneumothorax or pleural effusion identified. No definite left rib fractures identified on this examination. Right lung is clear. Cardiac size within normal limits. Pulmonary vascularity is normal. No acute bone abnormality. IMPRESSION: Left chest tube in place. No pneumothorax. No definite rib fracture identified. Dedicated left rib series may be more helpful for further evaluation. Electronically Signed   By: Helyn Numbers MD   On: 06/13/2020 10:54   DG Chest Port 1 View  Result Date: 06/12/2020 CLINICAL DATA:  Chest tube placement EXAM: PORTABLE CHEST 1 VIEW COMPARISON:  06/12/2020 FINDINGS: Left chest tube is been placed. Re-expansion of the left lung. No residual pneumothorax. Left base atelectasis. Right lung clear. Heart is normal size. IMPRESSION: Re-expansion of the left lung after insertion of left chest tube. Left base atelectasis. Electronically Signed   By: Charlett Nose M.D.   On: 06/12/2020 19:17   DG Chest Port 1 View  Result Date: 06/12/2020 CLINICAL DATA:  Recent fall with left-sided chest pain, initial encounter EXAM: PORTABLE CHEST 1 VIEW  COMPARISON:  05/05/2019 FINDINGS: Large left pneumothorax is noted. There is approximately 3.7 cm of excursion superiorly in the apex. Left seventh rib fracture is noted laterally. No other definitive fracture is seen. IMPRESSION: Left seventh rib fracture with associated large pneumothorax. Critical Value/emergent results were called by telephone at the time of interpretation on 06/12/2020 at 4:22 pm to Dr. Melene Plan , who verbally acknowledged these results. Electronically Signed   By: Alcide Clever M.D.   On: 06/12/2020 16:23    Anti-infectives: Anti-infectives (From admission, onward)   None       Assessment/Plan Fall down stairs Left rib fx w/  PTX - no obvious PTX on CXR.  Waterseal.  Repeat film in am and hopefully DC CT tomorrow am Chest wall laceration - s/p repair by EDP FEN - Reg VTE - SCDs, start Lovenox ID - None Foley - None Dispo - AM CXR. CT to waterseal, likely home tomorrow if able to DC CT   LOS: 1 day    Letha Cape , [facility] 06/14/2020, 7:29 AM Please see Amion for pager number during day hours 7:00am-4:30pm or 7:00am -11:30am on weekends

## 2020-06-14 NOTE — TOC CAGE-AID Note (Addendum)
Transition of Care [facility]) - CAGE-AID Screening   Patient Details  Name: Melissa [name] MRN: [id number] Date of Birth: 11-09-1995  [facility] CM/SW Contact:    Emeterio Reeve, [place] Phone Number: 06/14/2020, 12:32 PM   Clinical Narrative:  CSW met with pt at bedside. CSW introduced self and explained role at the hospital.  Pt denies alcohol use. Pt denies substance use. Pt did not need any resources at this time.    CAGE-AID Screening:    Have You Ever Felt You Ought to Cut Down on Your Drinking or Drug Use?: No Have People Annoyed You By Critizing Your Drinking Or Drug Use?: No Have You Felt Bad Or Guilty About Your Drinking Or Drug Use?: No Have You Ever Had a Drink or Used Drugs First Thing In The Morning to Steady Your Nerves or to Get Rid of a Hangover?: No CAGE-AID Score: 0  Substance Abuse Education Offered: Yes  Substance abuse interventions: Patient Counseling  Emeterio Reeve, Latanya Presser, [place] Social Worker [phone]

## 2020-06-14 NOTE — Progress Notes (Signed)
Physical Therapy Treatment Patient Details Name: Melissa [name] MRN: [id number] DOB: 11/07/1995 Today's Date: 06/14/2020    History of Present Illness 24 yo female with reported fall on the stairs presented to ED with L 7th rib fracture and pneumothorax, chest tube inserted.   Has 4th fingernail avulsion, as well as other fingernail injuries.  PMHx:  bipolar disorder, depression, asthma, schizoaffective disorder,     PT Comments    Patient progressing well towards physical therapy goals. Patient ambulated 500' with min guard and no AD. Patient unsteady on turns, however with subsequent trials seemed to improve. Patient continues to be limited by pain, generalized weakness. Anticipate no further PT needs following discharge. PT will continue to follow. Attempt stairs next session for safe home accessibility.     Follow Up Recommendations  No PT follow up;Supervision for mobility/OOB     Equipment Recommendations  None recommended by PT    Recommendations for Other Services       Precautions / Restrictions Precautions Precautions: Fall Restrictions Weight Bearing Restrictions: No    Mobility  Bed Mobility Overal bed mobility: Needs Assistance Bed Mobility: Supine to Sit;Sit to Supine     Supine to sit: Supervision Sit to supine: Supervision   General bed mobility comments: supervision and assistance for lines  Transfers Overall transfer level: Needs assistance Equipment used: None Transfers: Sit to/from Stand Sit to Stand: Supervision         General transfer comment: Initially upon standing, patient required min guard due to increased pain  Ambulation/Gait Ambulation/Gait assistance: Min guard Gait Distance (Feet): 500 Feet Assistive device: None Gait Pattern/deviations: Step-through pattern;Decreased stride length;Narrow base of support (Hands close to chest due to pain)     General Gait Details: Patient motivated to ambulate further distances, however  needs assistance for minor balance deficits during turns   Optometrist    Modified Rankin (Stroke Patients Only)       Balance Overall balance assessment: Needs assistance Sitting-balance support: Feet supported Sitting balance-Leahy Scale: Good     Standing balance support: No upper extremity supported;During functional activity Standing balance-Leahy Scale: Fair Standing balance comment: Patient hesitant with turns due to instability with ambulating                             Cognition Arousal/Alertness: Awake/alert Behavior During Therapy: WFL for tasks assessed/performed Overall Cognitive Status: Within Functional Limits for tasks assessed                                        Exercises      General Comments        Pertinent Vitals/Pain Pain Assessment: Faces Faces Pain Scale: Hurts little more Pain Location: L side, chest tube location Pain Descriptors / Indicators: Operative site guarding Pain Intervention(s): Monitored during session    Home Living                      Prior Function            PT Goals (current goals can now be found in the care plan section) Acute Rehab PT Goals Patient Stated Goal: to keep walking and go home PT Goal Formulation: With patient Time For Goal Achievement: 06/20/20 Potential to Achieve Goals: Good  Progress towards PT goals: Progressing toward goals    Frequency    Min 3X/week      PT Plan Current plan remains appropriate    Co-evaluation              AM-PAC PT "6 Clicks" Mobility   Outcome Measure  Help needed turning from your back to your side while in a flat bed without using bedrails?: None Help needed moving from lying on your back to sitting on the side of a flat bed without using bedrails?: None Help needed moving to and from a bed to a chair (including a wheelchair)?: None Help needed standing up from a chair using  your arms (e.g., wheelchair or bedside chair)?: A Little Help needed to walk in hospital room?: A Little Help needed climbing 3-5 steps with a railing? : A Little 6 Click Score: 21    End of Session   Activity Tolerance: Patient tolerated treatment well Patient left: in bed;with call bell/phone within reach Nurse Communication: Mobility status PT Visit Diagnosis: Unsteadiness on feet (R26.81);Pain Pain - Right/Left: Left Pain - part of body:  (rib cage)     Time: 6160-7371 PT Time Calculation (min) (ACUTE ONLY): 15 min  Charges:  $Therapeutic Activity: 8-22 mins                     Gregor Hams, PT, DPT Acute Rehabilitation Services Pager [phone] Office [phone]    Zannie Kehr Allred 06/14/2020, 2:43 PM

## 2020-06-14 NOTE — Plan of Care (Signed)
  Problem: Education: Goal: Knowledge of General Education information will improve Description Including pain rating scale, medication(s)/side effects and non-pharmacologic comfort measures Outcome: Progressing   

## 2020-06-15 ENCOUNTER — Inpatient Hospital Stay (HOSPITAL_COMMUNITY): Payer: Medicaid Other

## 2020-06-15 MED ORDER — ACETAMINOPHEN 325 MG PO TABS: 650.0000 mg | ORAL_TABLET | Freq: Four times a day (QID) | ORAL | Status: DC

## 2020-06-15 MED ORDER — ACETAMINOPHEN 325 MG PO TABS
650.0000 mg | ORAL_TABLET | Freq: Four times a day (QID) | ORAL | Status: DC | PRN
Start: 2020-06-15 — End: 2021-04-26

## 2020-06-15 MED ORDER — IBUPROFEN 800 MG PO TABS
800.0000 mg | ORAL_TABLET | Freq: Three times a day (TID) | ORAL | 0 refills | Status: DC | PRN
Start: 2020-06-15 — End: 2020-06-15

## 2020-06-15 MED ORDER — OXYCODONE HCL 5 MG PO TABS
5.0000 mg | ORAL_TABLET | ORAL | 0 refills | Status: DC | PRN
Start: 2020-06-15 — End: 2020-09-10

## 2020-06-15 NOTE — Progress Notes (Signed)
Physical Therapy Treatment and Discharge Patient Details Name: Melissa [name] MRN: [id number] DOB: 07/12/1996 Today's Date: 06/15/2020    History of Present Illness 24 yo female with reported fall on the stairs presented to ED with L 7th rib fracture and pneumothorax, chest tube inserted.   Has 4th fingernail avulsion, as well as other fingernail injuries.  PMHx:  bipolar disorder, depression, asthma, schizoaffective disorder,     PT Comments    Patient has met all goals adequately for discharge. Patient is modI for bed mobility, transfers, and ambulation, requires supervision for stairs for safety. Educated patient about safety when returning home,i.e refraining from texting while walking and limiting distractions. No PT follow up needed. Patient is no longer in need of skilled PT services.     Follow Up Recommendations  No PT follow up     Equipment Recommendations  None recommended by PT    Recommendations for Other Services       Precautions / Restrictions Precautions Precautions: Fall Restrictions Weight Bearing Restrictions: No Other Position/Activity Restrictions: L 4th finger nail avulsion    Mobility  Bed Mobility Overal bed mobility: Modified Independent                Transfers Overall transfer level: Modified independent Equipment used: None Transfers: Sit to/from Stand Sit to Stand: Modified independent (Device/Increase time)            Ambulation/Gait Ambulation/Gait assistance: Modified independent (Device/Increase time) Gait Distance (Feet): 250 Feet Assistive device: None Gait Pattern/deviations: Step-through pattern;Wide base of support (hands close to chest for comfort)   Gait velocity interpretation: >2.62 ft/sec, indicative of community ambulatory     Stairs Stairs: Yes Stairs assistance: Supervision Stair Management: One rail Right;Alternating pattern Number of Stairs: 12     Wheelchair Mobility    Modified Rankin  (Stroke Patients Only)       Balance Overall balance assessment: Modified Independent                                          Cognition Arousal/Alertness: Awake/alert Behavior During Therapy: WFL for tasks assessed/performed Overall Cognitive Status: Within Functional Limits for tasks assessed                                        Exercises      General Comments        Pertinent Vitals/Pain Pain Assessment: Faces Faces Pain Scale: Hurts a little bit Pain Location: L side, rib cage Pain Descriptors / Indicators: Pressure;Operative site guarding Pain Intervention(s): Monitored during session    Home Living                      Prior Function            PT Goals (current goals can now be found in the care plan section) Acute Rehab PT Goals Patient Stated Goal: to go home PT Goal Formulation: With patient Time For Goal Achievement: 06/20/20 Potential to Achieve Goals: Good Progress towards PT goals: Goals met/education completed, patient discharged from PT    Frequency    Min 3X/week      PT Plan Current plan remains appropriate    Co-evaluation              AM-PAC PT "  6 Clicks" Mobility   Outcome Measure  Help needed turning from your back to your side while in a flat bed without using bedrails?: None Help needed moving from lying on your back to sitting on the side of a flat bed without using bedrails?: None Help needed moving to and from a bed to a chair (including a wheelchair)?: None Help needed standing up from a chair using your arms (e.g., wheelchair or bedside chair)?: None Help needed to walk in hospital room?: None Help needed climbing 3-5 steps with a railing? : None 6 Click Score: 24    End of Session   Activity Tolerance: Patient tolerated treatment well Patient left: in bed;with call bell/phone within reach Nurse Communication: Mobility status PT Visit Diagnosis: Unsteadiness on feet  (R26.81);Pain Pain - Right/Left: Left Pain - part of body:  (rib cage)     Time: 1510-1520 PT Time Calculation (min) (ACUTE ONLY): 10 min  Charges:  $Therapeutic Activity: 8-22 mins                     Perrin Maltese, PT, DPT Acute Rehabilitation Services Pager [phone] Office [phone]    Melene Plan Allred 06/15/2020, 3:34 PM

## 2020-06-15 NOTE — Discharge Summary (Signed)
    Patient ID: Melissa [name] [id number] 07-09-1996 24 y.o.  Admit date: 06/12/2020 Discharge date: 06/15/2020  Admitting Diagnosis: Fall Left PTX Left-sided rib fracture  Discharge Diagnosis Patient Active Problem List   Diagnosis Date Noted  . Pneumothorax, traumatic 06/12/2020  . Schizoaffective disorder, depressive type (HCC) 07/19/2016  Fall Left PTX Left-sided rib fracture  Consultants none  Reason for Admission: 24 year old woman who sustained a fall down a few stairs around noon today.  She landed on her left and did sustain a laceration on the left side of her chest wall.  She also noted a fourth digit fingernail avulsion and injuries to other fingernails. Did hit her head but no loss of consciousness. Denies headache, vision change or dizziness. Complains of pain located in the left chest which is aching and sharp in quality, some radiation to the back, was sudden onset with the injury and is moderate in severity.  Aggravated by deep breaths and by laying flat.  This was been constant since the event. Currently she is experiencing extreme anxiety about getting an IV started.   Procedures Placement of a pigtail chest tube catheter  Hospital Course:  The patient was admitted after chest tube placement in the ED.  Her tube remained on suction for 24 hrs.  Repeat film showed resolution of PTX.  She was placed to waterseal with no further issues.  Her tube was then removed and follow up CXR showed no residual PTX.  She was stable for DC home.  She will follow up for suture removal of laceration site as well as trauma clinic follow up and repeat CXR.  Allergies as of 06/15/2020   No Known Allergies     Medication List    TAKE these medications   acetaminophen 325 MG tablet Commonly known as: TYLENOL Take 2 tablets (650 mg total) by mouth every 6 (six) hours as needed.   oxyCODONE 5 MG immediate release tablet Commonly known as: Oxy IR/ROXICODONE Take 1 tablet (5  mg total) by mouth every 4 (four) hours as needed for severe pain or breakthrough pain.         Follow-up Information    [facility] [place] Follow up in 1 week(s).   Specialty: General Surgery Why: This will be a nurse visit only to remove your staples from underneath your left arm Contact information: [address] [place] [address] [phone]        [facility] Follow up in 2 week(s).   Why: Arrive 30 minutes before your appointment for check in and paperwork process.  This is to follow up on your collapsed lung that has improved. Contact information: [address] [place] [address] [phone]       [name] Follow up on 06/29/2020.   Why: Please go get a chest x-ray before your trauma clinic appointment.  The order will be sent. you just need to walk in and tell them you need a chest x-ray Contact information: [address] [place] [address] [phone]               Signed: Leary Roca, [facility] 06/15/2020, 2:51 PM Please see Amion for pager number during day hours 7:00am-4:30pm, 7-11:30am on Weekends

## 2020-06-15 NOTE — Discharge Instructions (Signed)
Rib Fracture  A rib fracture is a break or crack in one of the bones of the ribs. The ribs are like a cage that goes around your upper chest. A broken or cracked rib is often painful, but most do not cause other problems. Most rib fractures usually heal on their own in 1-3 months. Follow these instructions at home: Managing pain, stiffness, and swelling  If directed, apply ice to the injured area. ? Put ice in a plastic bag. ? Place a towel between your skin and the bag. ? Leave the ice on for 20 minutes, 2-3 times a day.  Take over-the-counter and prescription medicines only as told by your doctor. Activity  Avoid activities that cause pain to the injured area. Protect your injured area.  Slowly increase activity as told by your doctor. General instructions  Do deep breathing as told by your doctor. You may be told to: ? Take deep breaths many times a day. ? Cough many times a day while hugging a pillow. ? Use a device (incentive spirometer) to do deep breathing many times a day.  Drink enough fluid to keep your pee (urine) clear or pale yellow.  Do not wear a rib belt or binder. These do not allow you to breathe deeply.  Keep all follow-up visits as told by your doctor. This is important. Contact a doctor if:  You have a fever. Get help right away if:  You have trouble breathing.  You are short of breath.  You cannot stop coughing.  You cough up thick or bloody spit (sputum).  You feel sick to your stomach (nauseous), throw up (vomit), or have belly (abdominal) pain.  Your pain gets worse and medicine does not help. Summary  A rib fracture is a break or crack in one of the bones of the ribs.  Apply ice to the injured area and take medicines for pain as told by your doctor.  Take deep breaths and cough many times a day. Hug a pillow every time you cough. This information is not intended to replace advice given to you by your health care provider. Make sure you  discuss any questions you have with your health care provider. Document Revised: 07/26/2017 Document Reviewed: 11/13/2016 Elsevier Patient Education  2020 [employer]   Pneumothorax A pneumothorax is commonly called a collapsed lung. It is a condition in which air leaks from a lung and builds up between the thin layer of tissue that covers the lungs (visceral pleura) and the interior wall of the chest cavity (parietal pleura). The air gets trapped outside the lung, between the lung and the chest wall (pleural space). The air takes up space and prevents the lung from fully expanding. This condition sometimes occurs suddenly with no apparent cause. The buildup of air may be small or large. A small pneumothorax may go away on its own. A large pneumothorax will require treatment and hospitalization. What are the causes? This condition may be caused by:  Trauma and injury to the chest wall.  Surgery and other medical procedures.  A complication of an underlying lung problem, especially chronic obstructive pulmonary disease (COPD) or emphysema. Sometimes the cause of this condition is not known. What increases the risk? You are more likely to develop this condition if:  You have an underlying lung problem.  You smoke.  You are 3420-26 years old, female, tall, and underweight.  You have a personal or family history of pneumothorax.  You have an eating  disorder (anorexia nervosa). This condition can also happen quickly, even in people with no history of lung problems. What are the signs or symptoms? Sometimes a pneumothorax will have no symptoms. When symptoms are present, they can include:  Chest pain.  Shortness of breath.  Increased rate of breathing.  Bluish color to your lips or skin (cyanosis). How is this diagnosed? This condition may be diagnosed by:  A medical history and physical exam.  A chest X-ray, chest CT scan, or ultrasound. How is this treated? Treatment depends  on how severe your condition is. The goal of treatment is to remove the extra air and allow your lung to expand back to its normal size.  For a small pneumothorax: ? No treatment may be needed. ? Extra oxygen is sometimes used to make it go away more quickly.  For a large pneumothorax or a pneumothorax that is causing symptoms, a procedure is done to drain the air from your lungs. To do this, a health care provider may use: ? A needle with a syringe. This is used to suck air from a pleural space where no additional leakage is taking place. ? A chest tube. This is used to suck air where there is ongoing leakage into the pleural space. The chest tube may need to remain in place for several days until the air leak has healed.  In more severe cases, surgery may be needed to repair the damage that is causing the leak.  If you have multiple pneumothorax episodes or have an air leak that will not heal, a procedure called a pleurodesis may be done. A medicine is placed in the pleural space to irritate the tissues around the lung so that the lung will stick to the chest wall, seal any leaks, and stop any buildup of air in that space. If you have an underlying lung problem, severe symptoms, or a large pneumothorax you will usually need to stay in the hospital. Follow these instructions at home: Lifestyle  Do not use any products that contain nicotine or tobacco, such as cigarettes and e-cigarettes. These are major risk factors in pneumothorax. If you need help quitting, ask your health care provider.  Do not lift anything that is heavier than 10 lb (4.5 kg), or the limit that your health care provider tells you, until he or she says that it is safe.  Avoid activities that take a lot of effort (strenuous) for as long as told by your health care provider.  Return to your normal activities as told by your health care provider. Ask your health care provider what activities are safe for you.  Do not fly in  an airplane or scuba dive until your health care provider says it is okay. General instructions  Take over-the-counter and prescription medicines only as told by your health care provider.  If a cough or pain makes it difficult for you to sleep at night, try sleeping in a semi-upright position in a recliner or by using 2 or 3 pillows.  If you had a chest tube and it was removed, ask your health care provider when you can remove the bandage (dressing). While the dressing is in place, do not allow it to get wet.  Keep all follow-up visits as told by your health care provider. This is important. Contact a health care provider if:  You cough up thick mucus (sputum) that is yellow or green in color.  You were treated with a chest tube, and you have  redness, increasing pain, or discharge at the site where it was placed. Get help right away if:  You have increasing chest pain or shortness of breath.  You have a cough that will not go away.  You begin coughing up blood.  You have pain that is getting worse or is not controlled with medicines.  The site where your chest tube was located opens up.  You feel air coming out of the site where the chest tube was placed.  You have a fever or persistent symptoms for more than 2-3 days.  You have a fever and your symptoms suddenly get worse. These symptoms may represent a serious problem that is an emergency. Do not wait to see if the symptoms will go away. Get medical help right away. Call your local emergency services (911 in the [place] Do not drive yourself to the hospital. Summary  A pneumothorax, commonly called a collapsed lung, is a condition in which air leaks from a lung and gets trapped between the lung and the chest wall (pleural space).  The buildup of air may be small or large. A small pneumothorax may go away on its own. A large pneumothorax will require treatment and hospitalization.  Treatment for this condition depends on how  severe the pneumothorax is. The goal of treatment is to remove the extra air and allow the lung to expand back to its normal size. This information is not intended to replace advice given to you by your health care provider. Make sure you discuss any questions you have with your health care provider. Document Revised: 07/26/2017 Document Reviewed: 07/22/2017 Elsevier Patient Education  2020 [employer]  Rib Fracture  A rib fracture is a break or crack in one of the bones of the ribs. The ribs are like a cage that goes around your upper chest. A broken or cracked rib is often painful, but most do not cause other problems. Most rib fractures usually heal on their own in 1-3 months. Follow these instructions at home: Managing pain, stiffness, and swelling  If directed, apply ice to the injured area. ? Put ice in a plastic bag. ? Place a towel between your skin and the bag. ? Leave the ice on for 20 minutes, 2-3 times a day.  Take over-the-counter and prescription medicines only as told by your doctor. Activity  Avoid activities that cause pain to the injured area. Protect your injured area.  Slowly increase activity as told by your doctor. General instructions  Do deep breathing as told by your doctor. You may be told to: ? Take deep breaths many times a day. ? Cough many times a day while hugging a pillow. ? Use a device (incentive spirometer) to do deep breathing many times a day.  Drink enough fluid to keep your pee (urine) clear or pale yellow.  Do not wear a rib belt or binder. These do not allow you to breathe deeply.  Keep all follow-up visits as told by your doctor. This is important. Contact a doctor if:  You have a fever. Get help right away if:  You have trouble breathing.  You are short of breath.  You cannot stop coughing.  You cough up thick or bloody spit (sputum).  You feel sick to your stomach (nauseous), throw up (vomit), or have belly (abdominal)  pain.  Your pain gets worse and medicine does not help. Summary  A rib fracture is a break or crack in one of the bones of the  ribs.  Apply ice to the injured area and take medicines for pain as told by your doctor.  Take deep breaths and cough many times a day. Hug a pillow every time you cough. This information is not intended to replace advice given to you by your health care provider. Make sure you discuss any questions you have with your health care provider. Document Revised: 07/26/2017 Document Reviewed: 11/13/2016 Elsevier Patient Education  2020 [employer] Inc.   Pneumothorax A pneumothorax is commonly called a collapsed lung. It is a condition in which air leaks from a lung and builds up between the thin layer of tissue that covers the lungs (visceral pleura) and the interior wall of the chest cavity (parietal pleura). The air gets trapped outside the lung, between the lung and the chest wall (pleural space). The air takes up space and prevents the lung from fully expanding. This condition sometimes occurs suddenly with no apparent cause. The buildup of air may be small or large. A small pneumothorax may go away on its own. A large pneumothorax will require treatment and hospitalization. What are the causes? This condition may be caused by:  Trauma and injury to the chest wall.  Surgery and other medical procedures.  A complication of an underlying lung problem, especially chronic obstructive pulmonary disease (COPD) or emphysema. Sometimes the cause of this condition is not known. What increases the risk? You are more likely to develop this condition if:  You have an underlying lung problem.  You smoke.  You are 34-48 years old, female, tall, and underweight.  You have a personal or family history of pneumothorax.  You have an eating disorder (anorexia nervosa). This condition can also happen quickly, even in people with no history of lung problems. What are the signs  or symptoms? Sometimes a pneumothorax will have no symptoms. When symptoms are present, they can include:  Chest pain.  Shortness of breath.  Increased rate of breathing.  Bluish color to your lips or skin (cyanosis). How is this diagnosed? This condition may be diagnosed by:  A medical history and physical exam.  A chest X-ray, chest CT scan, or ultrasound. How is this treated? Treatment depends on how severe your condition is. The goal of treatment is to remove the extra air and allow your lung to expand back to its normal size.  For a small pneumothorax: ? No treatment may be needed. ? Extra oxygen is sometimes used to make it go away more quickly.  For a large pneumothorax or a pneumothorax that is causing symptoms, a procedure is done to drain the air from your lungs. To do this, a health care provider may use: ? A needle with a syringe. This is used to suck air from a pleural space where no additional leakage is taking place. ? A chest tube. This is used to suck air where there is ongoing leakage into the pleural space. The chest tube may need to remain in place for several days until the air leak has healed.  In more severe cases, surgery may be needed to repair the damage that is causing the leak.  If you have multiple pneumothorax episodes or have an air leak that will not heal, a procedure called a pleurodesis may be done. A medicine is placed in the pleural space to irritate the tissues around the lung so that the lung will stick to the chest wall, seal any leaks, and stop any buildup of air in that space. If you  have an underlying lung problem, severe symptoms, or a large pneumothorax you will usually need to stay in the hospital. Follow these instructions at home: Lifestyle  Do not use any products that contain nicotine or tobacco, such as cigarettes and e-cigarettes. These are major risk factors in pneumothorax. If you need help quitting, ask your health care  provider.  Do not lift anything that is heavier than 10 lb (4.5 kg), or the limit that your health care provider tells you, until he or she says that it is safe.  Avoid activities that take a lot of effort (strenuous) for as long as told by your health care provider.  Return to your normal activities as told by your health care provider. Ask your health care provider what activities are safe for you.  Do not fly in an airplane or scuba dive until your health care provider says it is okay. General instructions  Take over-the-counter and prescription medicines only as told by your health care provider.  If a cough or pain makes it difficult for you to sleep at night, try sleeping in a semi-upright position in a recliner or by using 2 or 3 pillows.  If you had a chest tube and it was removed, ask your health care provider when you can remove the bandage (dressing). While the dressing is in place, do not allow it to get wet.  Keep all follow-up visits as told by your health care provider. This is important. Contact a health care provider if:  You cough up thick mucus (sputum) that is yellow or green in color.  You were treated with a chest tube, and you have redness, increasing pain, or discharge at the site where it was placed. Get help right away if:  You have increasing chest pain or shortness of breath.  You have a cough that will not go away.  You begin coughing up blood.  You have pain that is getting worse or is not controlled with medicines.  The site where your chest tube was located opens up.  You feel air coming out of the site where the chest tube was placed.  You have a fever or persistent symptoms for more than 2-3 days.  You have a fever and your symptoms suddenly get worse. These symptoms may represent a serious problem that is an emergency. Do not wait to see if the symptoms will go away. Get medical help right away. Call your local emergency services (911 in the  [place] Do not drive yourself to the hospital. Summary  A pneumothorax, commonly called a collapsed lung, is a condition in which air leaks from a lung and gets trapped between the lung and the chest wall (pleural space).  The buildup of air may be small or large. A small pneumothorax may go away on its own. A large pneumothorax will require treatment and hospitalization.  Treatment for this condition depends on how severe the pneumothorax is. The goal of treatment is to remove the extra air and allow the lung to expand back to its normal size. This information is not intended to replace advice given to you by your health care provider. Make sure you discuss any questions you have with your health care provider. Document Revised: 07/26/2017 Document Reviewed: 07/22/2017 Elsevier Patient Education  2020 [employer].

## 2020-06-16 ENCOUNTER — Telehealth: Payer: Self-pay

## 2020-06-16 NOTE — Telephone Encounter (Signed)
Transition Care Management Follow-up Telephone Call Date of discharge and from where:[facility] on 06/15/2020  Unable to reach pt , left VM to call back the center. Name and info provided.

## 2020-06-17 ENCOUNTER — Telehealth: Payer: Self-pay

## 2020-06-17 NOTE — Telephone Encounter (Signed)
Transition Care Management Follow-up Telephone Call Date of discharge and from where:[facility] on 06/15/2020  2 nd attempt, Unable to reach pt , left VM to call back the center. Name and info provided Pt does not have F/U apt scheduled .  Letter will be sent.

## 2020-06-20 ENCOUNTER — Telehealth: Payer: Self-pay

## 2020-06-20 NOTE — Telephone Encounter (Signed)
Letter sent to patient requesting that she contact this office to schedule a follow up appointment as we have not been able to reach her.

## 2020-06-30 ENCOUNTER — Other Ambulatory Visit: Payer: Self-pay

## 2020-06-30 ENCOUNTER — Inpatient Hospital Stay (HOSPITAL_COMMUNITY)
Admission: AD | Admit: 2020-06-30 | Discharge: 2020-06-30 | Disposition: A | Discharge status: Home / Self Care | Payer: Medicaid Other | Attending: Obstetrics and Gynecology | Admitting: Obstetrics and Gynecology

## 2020-06-30 DIAGNOSIS — Z3202 Encounter for pregnancy test, result negative: Secondary | ICD-10-CM | POA: Diagnosis not present

## 2020-06-30 DIAGNOSIS — N946 Dysmenorrhea, unspecified: Secondary | ICD-10-CM

## 2020-06-30 DIAGNOSIS — R1030 Lower abdominal pain, unspecified: Secondary | ICD-10-CM | POA: Diagnosis not present

## 2020-06-30 DIAGNOSIS — R1110 Vomiting, unspecified: Secondary | ICD-10-CM | POA: Insufficient documentation

## 2020-06-30 LAB — POCT PREGNANCY, URINE: Preg Test, Ur: NEGATIVE

## 2020-06-30 NOTE — MAU Note (Addendum)
Patient reports via EMS for gradual onset abdominal cramping today followed by vaginal bleeding that resembled the amount of a period.  Pain occurred intermittently in her lower abdomen and was accompanied by emesis.  Took 1 naproxen for the pain then vomited it up.  LMP 06/02/2020

## 2020-06-30 NOTE — MAU Provider Note (Addendum)
History     CSN: [id number]  Arrival date and time: 06/30/20 1255   First Provider Initiated Contact with Patient 06/30/20 1507      Chief Complaint  Patient presents with  . Abdominal Pain  . Vaginal Bleeding   Melissa [name] is a 24 yo G1P0010 who presented to the MAU via EMS for gradual onset abdominal cramping today followed by vaginal bleeding that resembled the amount of a period.  Pain occurred intermittently in her lower abdomen and was accompanied by emesis.  Took 1 naproxen for the pain then vomited it up.  LMP 06/02/2020. She is currently having unprotected sex in hopes of becoming pregnant.    OB History    Gravida  1   Para  0   Term  0   Preterm  0   AB  1   Living  0     SAB  1   TAB  0   Ectopic  0   Multiple  0   Live Births  0           Past Medical History:  Diagnosis Date  . Asthma   . Bipolar 1 disorder (HCC)   . Depression     Past Surgical History:  Procedure Laterality Date  . NO PAST SURGERIES      Family History  Problem Relation Age of Onset  . Asthma Mother   . Hypertension Father     Social History   Tobacco Use  . Smoking status: Never Smoker  . Smokeless tobacco: Never Used  Vaping Use  . Vaping Use: Never used  Substance Use Topics  . Alcohol use: No  . Drug use: Yes    Frequency: 4.0 times per week    Types: Marijuana    Allergies: No Known Allergies  Medications Prior to Admission  Medication Sig Dispense Refill Last Dose  . acetaminophen (TYLENOL) 325 MG tablet Take 2 tablets (650 mg total) by mouth every 6 (six) hours as needed.     Marland Kitchen oxyCODONE (OXY IR/ROXICODONE) 5 MG immediate release tablet Take 1 tablet (5 mg total) by mouth every 4 (four) hours as needed for severe pain or breakthrough pain. 15 tablet 0     Review of Systems  Constitutional: Negative for diaphoresis and fever.  Respiratory: Negative for cough and shortness of breath.   Cardiovascular: Negative for chest pain and  palpitations.  Gastrointestinal: Positive for abdominal pain, nausea and vomiting.  Genitourinary: Positive for vaginal bleeding and vaginal discharge. Negative for dysuria, flank pain, menstrual problem and pelvic pain.   Physical Exam   Blood pressure 113/75, pulse (!) 51, temperature 98.5 F (36.9 C), resp. rate 17, last menstrual period 06/30/2020, SpO2 100 %.  Physical Exam Constitutional:      Appearance: She is well-developed and normal weight.  HENT:     Head: Normocephalic.  Cardiovascular:     Rate and Rhythm: Normal rate.  Pulmonary:     Effort: Pulmonary effort is normal.  Abdominal:     General: Abdomen is flat. There is no distension.  Skin:    General: Skin is warm.  Neurological:     General: No focal deficit present.     Mental Status: She is alert and oriented to person, place, and time.  Psychiatric:        Mood and Affect: Affect is blunt and flat.        Behavior: Behavior is slowed and withdrawn.  Cognition and Memory: Cognition is impaired.     MAU Course  Procedures  MDM Ms. Tarkowski is a 24 yo G7P0010 female who presented today with abdominal cramping, severe pain, and vaginal bleeding consistent with her normal menstrual cycle. Timing of this episode and LMP of 06/02/2020 indicate regular menses. She is currently trying to become pregnant and hoped she was pregnant. She asked why the pregnancy tests kept coming back negative. We discussed her now two normal menstrual cycles since her last unprotected intercourse and how repeated negative pregnancy tests indicated she was not pregnant. She declines birth control as she is hoping to become pregnant.   Assessment and Plan  A: Normal menstrual cycle  P: Discharge home with return precautions. Counseling provided on how to determine if she is pregnant, including missed periods and positive pregnancy tests.   Fayette Pho 06/30/2020, 3:07 PM    CNM attestation:  I have seen and examined this  patient and agree with above documentation in the resident's note.   KIAH VANALSTINE is a 24 y.o. G1P0010 at Unknown reporting vaginal bleeding. She arrived by EMS.   PE: Patient Vitals for the past 24 hrs:  BP Temp Pulse Resp SpO2  06/30/20 1351 113/75 98.5 F (36.9 C) (!) 51 17 100 %   Gen: calm comfortable, NAD Resp: normal effort, no distress Heart: Regular rate Abd: Soft, NT  ROS, labs, PMH reviewed  Orders Placed This Encounter  Procedures  . Pregnancy, urine POC  . Discharge patient Discharge disposition: 01-Home or Self Care; Discharge patient date: 06/30/2020   No orders of the defined types were placed in this encounter.  MDM Patient has negative pregnancy test here in MAU. Due to no other complaints and negative pregnancy test, no further work-up was done.   Assessment: 1. Difficult menstruation    Plan: -Discharge home -Advised patient to start gyn care if she is worried about fertility and getting pregnant.  Marylene Land, CNM 06/30/2020 3:55 PM

## 2020-06-30 NOTE — Discharge Instructions (Signed)
Menstruation    Menstruation, also known as a menstrual period, is the monthly shedding of the lining of the uterus. The uterus is the organ in the lower abdomen where a baby grows during pregnancy. Menstruation involves the passing of blood, tissue, fluid, and mucus. The flow of blood usually occurs during 3-7 consecutive days each month.  Girls usually start their periods between the ages of 12 and 14, but some girls may be older or younger when they start their period. Some girls have regular monthly menstrual cycles right from the beginning. However, it is not unusual to have only a couple of drops of blood or spotting when first starting to have periods. It is also not unusual to have two periods a month or miss a month or two when first starting to have periods. Women will continue to have periods until they reach menopause, which usually occurs between the ages of 48 and 55.  What are the symptoms?  During your period, you pass blood, tissue, fluid, and mucus out of your vagina. Periods are different for each woman and girl. You may experience:  · Bleeding that lasts for 3-7 days. A little more or less bleeding is normal.  · Occasional heavy bleeding.  · Cramps in the lower abdomen.  · Aching or pain in the lower back area.  · Sore breasts.  · Dizziness.  · Nausea.  · Diarrhea.  Other symptoms may occur 5-10 days before your menstrual period starts. These symptoms are referred to as premenstrual syndrome (PMS). These symptoms can include:  · Headache.  · Breast tenderness and swelling.  · Bloating.  · Tiredness (fatigue).  · Mood changes.  · Craving for certain foods.  How does the menstrual cycle happen?  A period is part of a woman's menstrual cycle, which is a series of changes that the body goes through to get ready to become pregnant. The menstrual cycle usually lasts about 28 days, meaning that you will get your period about every 28 days if you do not get pregnant. However, some women get their periods  as soon as every 21 days or as late as every 35 days.  Hormones control the menstrual cycle. Hormones are chemicals that the body produces to regulate different body functions. These hormones trigger changes in your uterus. Every month, the lining of your uterus gets thicker to prepare for pregnancy. And every month that you do not get pregnant, your uterus gets rid of its thick lining and cleans itself out. This is your period.  How do I know if my period is not normal?  Periods are different for everyone. Your period may last for a longer or shorter time than usual, and bleeding may be light or heavy.  Signs that your period may not be normal include:  · Bleeding very heavily, such as soaking through a tampon or pad in 1-2 hours.  · Bleeding for many more days than normal.  · Bleeding after you have sex.  · Cramps that are so painful that you cannot do your daily activities.  · Cramps that get much worse than they used to be.  · Bleeding in between periods.  · Missing your period for longer than 3 months.  · Your menstrual cycle becoming irregular, when it used to be regular.  Follow these instructions at home:  · Keep track of your periods by using a calendar.  · If you use tampons, use the least absorbent possible to avoid complications such   over-the-counter pain reliever as told by your health care provider. ? Use a heating pad or heat wrap on your abdomen to ease cramping. ? Exercise 3-5 times a week or more. ? Avoid foods and drinks that you know will make your symptoms worse before or during your period. This includes foods that contain:  Caffeine.  Salt.  Sugar. Contact a health care provider if:  You have signs that your period may not be normal.  You develop a fever with your period.  Your  periods are lasting more than 7 days.  You develop clots with your period and never had clots before.  You cannot get relief for your symptoms from over-the-counter medicine.  Your period has not started, and it has been longer than 35 days. Get help right away if:  Your period is so heavy that you have to change pads or tampons every 30 minutes.  You have any symptoms of toxic shock syndrome (TSS), such as: ? A high fever. ? Vomiting or diarrhea. ? Red skin that looks like a sunburn. ? Red eyes. ? Fainting or feeling dizzy. ? Sore throat. ? Muscle aches. If you develop any of these symptoms, visit your health care provider immediately. TSS is a serious health condition that can be caused by wearing a tampon for too long. Summary  Menstruation, also known as a menstrual period, is the monthly shedding of the lining of the uterus.  During your period, you pass blood, tissue, fluid, and mucus out of your vagina.  Keep track of your periods by using a calendar.  Contact a health care provider if you have signs that your period may not be normal. This information is not intended to replace advice given to you by your health care provider. Make sure you discuss any questions you have with your health care provider. Document Revised: 07/26/2017 Document Reviewed: 10/10/2016 Elsevier Patient Education  2020 [employer].   Preparing for Pregnancy If you are considering becoming pregnant, make an appointment to see your regular health care provider to learn how to prepare for a safe and healthy pregnancy (preconception care). During a preconception care visit, your health care provider will:  Do a complete physical exam, including a Pap test.  Take a complete medical history.  Give you information, answer your questions, and help you resolve problems. Preconception checklist Medical history  Tell your health care provider about any current or past medical conditions. Your  pregnancy or your ability to become pregnant may be affected by chronic conditions, such as diabetes, chronic hypertension, and thyroid problems.  Include your family's medical history as well as your partner's medical history.  Tell your health care provider about any history of STIs (sexually transmitted infections).These can affect your pregnancy. In some cases, they can be passed to your baby. Discuss any concerns that you have about STIs.  If indicated, discuss the benefits of genetic testing. This testing will show whether there are any genetic conditions that may be passed from you or your partner to your baby.  Tell your health care provider about: ? Any problems you have had with conception or pregnancy. ? Any medicines you take. These include vitamins, herbal supplements, and over-the-counter medicines. ? Your history of immunizations. Discuss any vaccinations that you may need. Diet  Ask your health care provider what to include in a healthy diet that has a balance of nutrients. This is especially important when you are pregnant or preparing to become pregnant.  Ask your  health care provider to help you reach a healthy weight before pregnancy. ? If you are overweight, you may be at higher risk for certain complications, such as high blood pressure, diabetes, and preterm birth. ? If you are underweight, you are more likely to have a baby who has a low birth weight. Lifestyle, work, and home  Let your health care provider know: ? About any lifestyle habits that you have, such as alcohol use, drug use, or smoking. ? About recreational activities that may put you at risk during pregnancy, such as downhill skiing and certain exercise programs. ? Tell your health care provider about any international travel, especially any travel to places with an active [place] virus outbreak. ? About harmful substances that you may be exposed to at work or at home. These include chemicals, pesticides,  radiation, or even litter boxes. ? If you do not feel safe at home. Mental health  Tell your health care provider about: ? Any history of mental health conditions, including feelings of depression, sadness, or anxiety. ? Any medicines that you take for a mental health condition. These include herbs and supplements. Home instructions to prepare for pregnancy Lifestyle   Eat a balanced diet. This includes fresh fruits and vegetables, whole grains, lean meats, low-fat dairy products, healthy fats, and foods that are high in fiber. Ask to meet with a nutritionist or registered dietitian for assistance with meal planning and goals.  Get regular exercise. Try to be active for at least 30 minutes a day on most days of the week. Ask your health care provider which activities are safe during pregnancy.  Do not use any products that contain nicotine or tobacco, such as cigarettes and e-cigarettes. If you need help quitting, ask your health care provider.  Do not drink alcohol.  Do not take illegal drugs.  Maintain a healthy weight. Ask your health care provider what weight range is right for you. General instructions  Keep an accurate record of your menstrual periods. This makes it easier for your health care provider to determine your baby's due date.  Begin taking prenatal vitamins and folic acid supplements daily as directed by your health care provider.  Manage any chronic conditions, such as high blood pressure and diabetes, as told by your health care provider. This is important. How do I know that I am pregnant? You may be pregnant if you have been sexually active and you miss your period. Symptoms of early pregnancy include:  Mild cramping.  Very light vaginal bleeding (spotting).  Feeling unusually tired.  Nausea and vomiting (morning sickness). If you have any of these symptoms and you suspect that you might be pregnant, you can take a home pregnancy test. These tests check for  a hormone in your urine (human chorionic gonadotropin, or hCG). A woman's body begins to make this hormone during early pregnancy. These tests are very accurate. Wait until at least the first day after you miss your period to take one. If the test shows that you are pregnant (you get a positive result), call your health care provider to make an appointment for prenatal care. What should I do if I become pregnant?      Make an appointment with your health care provider as soon as you suspect you are pregnant.  Do not use any products that contain nicotine, such as cigarettes, chewing tobacco, and e-cigarettes. If you need help quitting, ask your health care provider.  Do not drink alcoholic beverages. Alcohol is  related to a number of birth defects.  Avoid toxic odors and chemicals.  You may continue to have sexual intercourse if it does not cause pain or other problems, such as vaginal bleeding. This information is not intended to replace advice given to you by your health care provider. Make sure you discuss any questions you have with your health care provider. Document Revised: 08/15/2017 Document Reviewed: 03/04/2016 Elsevier Patient Education  2020 [employer].

## 2020-07-02 ENCOUNTER — Encounter (HOSPITAL_COMMUNITY): Payer: Self-pay | Admitting: Emergency Medicine

## 2020-07-02 ENCOUNTER — Other Ambulatory Visit: Payer: Self-pay

## 2020-07-02 ENCOUNTER — Emergency Department (HOSPITAL_COMMUNITY)
Admission: EM | Admit: 2020-07-02 | Discharge: 2020-07-02 | Disposition: A | Discharge status: Home / Self Care | Payer: Medicaid Other | Attending: Emergency Medicine | Admitting: Emergency Medicine

## 2020-07-02 DIAGNOSIS — A64 Unspecified sexually transmitted disease: Secondary | ICD-10-CM | POA: Insufficient documentation

## 2020-07-02 DIAGNOSIS — R109 Unspecified abdominal pain: Secondary | ICD-10-CM | POA: Diagnosis not present

## 2020-07-02 DIAGNOSIS — Z5321 Procedure and treatment not carried out due to patient leaving prior to being seen by health care provider: Secondary | ICD-10-CM | POA: Insufficient documentation

## 2020-07-02 LAB — COMPREHENSIVE METABOLIC PANEL
ALT: 18 U/L (ref 0–44)
AST: 19 U/L (ref 15–41)
Albumin: 3.7 g/dL (ref 3.5–5.0)
Alkaline Phosphatase: 61 U/L (ref 38–126)
Anion gap: 8 (ref 5–15)
BUN: 11 mg/dL (ref 6–20)
CO2: 23 mmol/L (ref 22–32)
Calcium: 8.9 mg/dL (ref 8.9–10.3)
Chloride: 107 mmol/L (ref 98–111)
Creatinine, Ser: 0.84 mg/dL (ref 0.44–1.00)
GFR, Estimated: >60 mL/min (ref >60)
Glucose, Bld: 94 mg/dL (ref 70–99)
Potassium: 3.4 mmol/L — ABNORMAL LOW (ref 3.5–5.1)
Sodium: 138 mmol/L (ref 135–145)
Total Bilirubin: 0.4 mg/dL (ref 0.3–1.2)
Total Protein: 6.9 g/dL (ref 6.5–8.1)

## 2020-07-02 LAB — URINALYSIS, ROUTINE W REFLEX MICROSCOPIC
Bilirubin Urine: NEGATIVE
Glucose, UA: NEGATIVE mg/dL
Ketones, ur: NEGATIVE mg/dL
Leukocytes,Ua: NEGATIVE
Nitrite: NEGATIVE
Protein, ur: 30 mg/dL — AB
Specific Gravity, Urine: 1.034 — ABNORMAL HIGH (ref 1.005–1.030)
pH: 5 (ref 5.0–8.0)

## 2020-07-02 LAB — CBC WITH DIFFERENTIAL/PLATELET
Abs Immature Granulocytes: 0.01 10*3/uL (ref 0.00–0.07)
Basophils Absolute: 0.1 10*3/uL (ref 0.0–0.1)
Basophils Relative: 1 %
Eosinophils Absolute: 0.4 10*3/uL (ref 0.0–0.5)
Eosinophils Relative: 6 %
HCT: 34.2 % — ABNORMAL LOW (ref 36.0–46.0)
Hemoglobin: 10.9 g/dL — ABNORMAL LOW (ref 12.0–15.0)
Immature Granulocytes: 0 %
Lymphocytes Relative: 38 %
Lymphs Abs: 2.6 10*3/uL (ref 0.7–4.0)
MCH: 29.1 pg (ref 26.0–34.0)
MCHC: 31.9 g/dL (ref 30.0–36.0)
MCV: 91.4 fL (ref 80.0–100.0)
Monocytes Absolute: 0.7 10*3/uL (ref 0.1–1.0)
Monocytes Relative: 10 %
Neutro Abs: 3.1 10*3/uL (ref 1.7–7.7)
Neutrophils Relative %: 45 %
Platelets: 302 10*3/uL (ref 150–400)
RBC: 3.74 MIL/uL — ABNORMAL LOW (ref 3.87–5.11)
RDW: 13.5 % (ref 11.5–15.5)
WBC: 6.8 10*3/uL (ref 4.0–10.5)
nRBC: 0 % (ref 0.0–0.2)

## 2020-07-02 NOTE — ED Triage Notes (Signed)
Pt to ED with c/o menstrual cramps.  Also st's she has STD and needs treatment for same

## 2020-07-02 NOTE — ED Notes (Signed)
Unable to locate pt in lobby  

## 2020-08-03 ENCOUNTER — Encounter: Payer: Self-pay | Admitting: Family Medicine

## 2020-08-20 ENCOUNTER — Other Ambulatory Visit: Payer: Self-pay

## 2020-08-20 ENCOUNTER — Encounter (HOSPITAL_COMMUNITY): Payer: Self-pay

## 2020-08-20 ENCOUNTER — Emergency Department (HOSPITAL_COMMUNITY)
Admission: EM | Admit: 2020-08-20 | Discharge: 2020-08-20 | Disposition: A | Discharge status: Home / Self Care | Payer: Medicaid Other | Attending: Emergency Medicine | Admitting: Emergency Medicine

## 2020-08-20 DIAGNOSIS — Z3201 Encounter for pregnancy test, result positive: Secondary | ICD-10-CM | POA: Insufficient documentation

## 2020-08-20 DIAGNOSIS — R1032 Left lower quadrant pain: Secondary | ICD-10-CM | POA: Diagnosis present

## 2020-08-20 DIAGNOSIS — J45909 Unspecified asthma, uncomplicated: Secondary | ICD-10-CM | POA: Diagnosis not present

## 2020-08-20 LAB — COMPREHENSIVE METABOLIC PANEL
ALT: 15 U/L (ref 0–44)
AST: 18 U/L (ref 15–41)
Albumin: 3.6 g/dL (ref 3.5–5.0)
Alkaline Phosphatase: 58 U/L (ref 38–126)
Anion gap: 10 (ref 5–15)
BUN: 8 mg/dL (ref 6–20)
CO2: 20 mmol/L — ABNORMAL LOW (ref 22–32)
Calcium: 8.9 mg/dL (ref 8.9–10.3)
Chloride: 106 mmol/L (ref 98–111)
Creatinine, Ser: 0.9 mg/dL (ref 0.44–1.00)
GFR, Estimated: >60 mL/min (ref >60)
Glucose, Bld: 80 mg/dL (ref 70–99)
Potassium: 3.6 mmol/L (ref 3.5–5.1)
Sodium: 136 mmol/L (ref 135–145)
Total Bilirubin: 0.4 mg/dL (ref 0.3–1.2)
Total Protein: 7.1 g/dL (ref 6.5–8.1)

## 2020-08-20 LAB — CBC
HCT: 36.4 % (ref 36.0–46.0)
Hemoglobin: 11.4 g/dL — ABNORMAL LOW (ref 12.0–15.0)
MCH: 28.1 pg (ref 26.0–34.0)
MCHC: 31.3 g/dL (ref 30.0–36.0)
MCV: 89.7 fL (ref 80.0–100.0)
Platelets: 236 10*3/uL (ref 150–400)
RBC: 4.06 MIL/uL (ref 3.87–5.11)
RDW: 13.1 % (ref 11.5–15.5)
WBC: 5.8 10*3/uL (ref 4.0–10.5)
nRBC: 0 % (ref 0.0–0.2)

## 2020-08-20 LAB — URINALYSIS, ROUTINE W REFLEX MICROSCOPIC
Bacteria, UA: NONE SEEN
Bilirubin Urine: NEGATIVE
Glucose, UA: NEGATIVE mg/dL
Hgb urine dipstick: NEGATIVE
Ketones, ur: NEGATIVE mg/dL
Leukocytes,Ua: NEGATIVE
Nitrite: NEGATIVE
Protein, ur: NEGATIVE mg/dL
Specific Gravity, Urine: 1.023 (ref 1.005–1.030)
pH: 5 (ref 5.0–8.0)

## 2020-08-20 LAB — HCG, QUANTITATIVE, PREGNANCY: hCG, Beta Chain, Quant, S: 20 m[IU]/mL — ABNORMAL HIGH (ref <5)

## 2020-08-20 LAB — I-STAT BETA HCG BLOOD, ED (MC, WL, AP ONLY): I-stat hCG, quantitative: 19.1 m[IU]/mL — ABNORMAL HIGH (ref <5)

## 2020-08-20 LAB — LIPASE, BLOOD: Lipase: 30 U/L (ref 11–51)

## 2020-08-20 NOTE — Discharge Instructions (Addendum)
You are seen today for evaluation of pregnancy. You have tested positive for pregnancy today - congratulations.   You may follow for results of your pregnancy hormone on your MyChart.  You should follow-up with an OB/GYN for recheck of your pregnancy hormone in the next 48 hours.   Return to the emergency department if you develop any abdominal pain, vaginal bleeding, nausea or vomiting that does not stop or any other new severe symptoms.

## 2020-08-20 NOTE — ED Triage Notes (Signed)
Pt reports left sided abd pain that radiates to her back with some nausea.

## 2020-08-20 NOTE — ED Notes (Signed)
Pt d/c by MD and is provided w/ d/c instructions and follow up care, Pt is out of ED ambulatory by self  

## 2020-08-20 NOTE — ED Provider Notes (Signed)
[facility]   CSN: [id number] Arrival date & time: 08/20/20  1322     History Chief Complaint  Patient presents with  . Abdominal Pain  . Back Pain    Melissa [name] is a 24 y.o. female who presents with concern for mild left-sided abdominal cramping that radiates around to her back, as well as intermittent nausea for the last 5 days.  Patient expresses desire to determine if she is pregnant at this time.  She states that she took a home test yesterday and it was very faintly positive. LMP 07/27/2020.  Patient states that she was diagnosed with a chemical pregnancy in August of this year.  Endorses desire to conceive.  Patient states she is no longer having abdominal pain, nausea at this time.  Denies of vomiting, diarrhea, vaginal bleeding or discharge, denies pelvic pain at this time.  States that this time she feels very well, and she is here primarily to determine if she is pregnant so that she may inform her family at [employer] celebration. "I wanted a doctor to tell me I am pregnant, so my parents know it's true this time."  I personally reviewed this patient's medical records.  She has a history of depression, bipolar 1 disorder, asthma, history of traumatic pneumothorax.  She otherwise carries no medical diagnoses and is not on any medications every day.  States she is not currently on any mental health medications.    HPI     Past Medical History:  Diagnosis Date  . Asthma   . Bipolar 1 disorder (HCC)   . Depression     Patient Active Problem List   Diagnosis Date Noted  . Pneumothorax, traumatic 06/12/2020  . Schizoaffective disorder, depressive type (HCC) 07/19/2016    Past Surgical History:  Procedure Laterality Date  . NO PAST SURGERIES       OB History    Gravida  1   Para  0   Term  0   Preterm  0   AB  1   Living  0     SAB  1   IAB  0   Ectopic  0   Multiple  0   Live  Births  0           Family History  Problem Relation Age of Onset  . Asthma Mother   . Hypertension Father     Social History   Tobacco Use  . Smoking status: Never Smoker  . Smokeless tobacco: Never Used  Vaping Use  . Vaping Use: Never used  Substance Use Topics  . Alcohol use: No  . Drug use: Yes    Frequency: 4.0 times per week    Types: Marijuana    Home Medications Prior to Admission medications   Medication Sig Start Date End Date Taking? Authorizing Provider  acetaminophen (TYLENOL) 325 MG tablet Take 2 tablets (650 mg total) by mouth every 6 (six) hours as needed. 06/15/20   Maczis, Elmer SowMichael M, PA-C  oxyCODONE (OXY IR/ROXICODONE) 5 MG immediate release tablet Take 1 tablet (5 mg total) by mouth every 4 (four) hours as needed for severe pain or breakthrough pain. 06/15/20   Barnetta Chapelsborne, Kelly, PA-C    Allergies    Patient has no known allergies.  Review of Systems   Review of Systems  Constitutional: Negative for activity change, appetite change, chills, diaphoresis, fatigue and fever.  HENT: Negative.   Respiratory: Negative.  Cardiovascular: Negative.   Gastrointestinal: Positive for abdominal pain and nausea. Negative for blood in stool, constipation, diarrhea and vomiting.  Genitourinary: Negative for dysuria, hematuria, menstrual problem, pelvic pain, urgency, vaginal bleeding, vaginal discharge and vaginal pain.  Musculoskeletal: Negative.   Skin: Negative.   Neurological: Negative.   Hematological: Negative.     Physical Exam Updated Vital Signs BP 116/73   Pulse 88   Temp 98.6 F (37 C) (Oral)   Resp 16   SpO2 100%   Physical Exam Vitals and nursing note reviewed.  Constitutional:      General: She is not in acute distress.    Appearance: She is normal weight. She is not ill-appearing.  HENT:     Head: Normocephalic and atraumatic.     Right Ear: External ear normal.     Left Ear: External ear normal.     Nose: Nose normal.      Mouth/Throat:     Mouth: Mucous membranes are moist.     Pharynx: Oropharynx is clear. Uvula midline. No oropharyngeal exudate, posterior oropharyngeal erythema or uvula swelling.     Tonsils: No tonsillar exudate.  Eyes:     General: Vision grossly intact.        Right eye: No discharge.        Left eye: No discharge.     Extraocular Movements: Extraocular movements intact.     Conjunctiva/sclera: Conjunctivae normal.     Pupils: Pupils are equal, round, and reactive to light.  Neck:     Trachea: Trachea and phonation normal.  Cardiovascular:     Rate and Rhythm: Normal rate and regular rhythm.     Pulses: Normal pulses.          Radial pulses are 2+ on the right side and 2+ on the left side.       Dorsalis pedis pulses are 2+ on the right side and 2+ on the left side.     Heart sounds: Normal heart sounds. No murmur heard.   Pulmonary:     Effort: Pulmonary effort is normal. No respiratory distress.     Breath sounds: Normal breath sounds. No wheezing or rales.  Chest:     Chest wall: No swelling, tenderness, crepitus or edema.  Abdominal:     General: Bowel sounds are normal. There is no distension.     Palpations: Abdomen is soft.     Tenderness: There is no abdominal tenderness.  Musculoskeletal:        General: No deformity.     Cervical back: Neck supple. No crepitus. No pain with movement, spinous process tenderness or muscular tenderness.     Right lower leg: No edema.     Left lower leg: No edema.  Lymphadenopathy:     Cervical: No cervical adenopathy.  Skin:    General: Skin is warm and dry.     Capillary Refill: Capillary refill takes less than 2 seconds.  Neurological:     General: No focal deficit present.     Mental Status: She is alert and oriented to person, place, and time. Mental status is at baseline.  Psychiatric:        Mood and Affect: Mood normal.     ED Results / Procedures / Treatments   Labs (all labs ordered are listed, but only abnormal  results are displayed) Labs Reviewed  COMPREHENSIVE METABOLIC PANEL - Abnormal; Notable for the following components:      Result Value   CO2 20 (*)  All other components within normal limits  CBC - Abnormal; Notable for the following components:   Hemoglobin 11.4 (*)    All other components within normal limits  URINALYSIS, ROUTINE W REFLEX MICROSCOPIC - Abnormal; Notable for the following components:   APPearance HAZY (*)    All other components within normal limits  HCG, QUANTITATIVE, PREGNANCY - Abnormal; Notable for the following components:   hCG, Beta Chain, Quant, S 20 (*)    All other components within normal limits  I-STAT BETA HCG BLOOD, ED ([facility], [facility], [facility] ONLY) - Abnormal; Notable for the following components:   I-stat hCG, quantitative 19.1 (*)    All other components within normal limits  LIPASE, BLOOD    EKG None  Radiology No results found.  Procedures Procedures (including critical care time)  Medications Ordered in ED Medications - No data to display  ED Course  I have reviewed the triage vital signs and the nursing notes.  Pertinent labs & imaging results that were available during my care of the patient were reviewed by me and considered in my medical decision making (see chart for details).    MDM Rules/Calculators/A&P                         24 year old female presents to the emergency department with desire to determine if she is pregnant.  History of mild intermittent left-sided cramping and intermittent nausea over the last 5 days.  LMP 07/27/2020.   Vital signs are normal on intake.  Laboratory studies obtained in triage are as follows.  CBC unremarkable, CMP unremarkable.  UA unremarkable.  I-STAT hCG positive for pregnancy at 19.1.  Physical exam of the patient is very reassuring.  Cardiopulmonary exam is normal, abdominal exam is benign.  Shared decision making with patient regarding role for pelvic exam at this time.  Patient declined  pelvic exam today, reiterates that she just wants to know for sure if she is pregnant or not.  I informed the patient that based on her point-of-care test she is positive for pregnancy at this time, however since she has yet to miss a menstrual cycle, she is likely very early in pregnancy.  Patient requesting testing to "tell me how pregnant I am"; explained role for quantitative hCG testing, patient clearly expressed desire to proceed with quantitative hCG.  Quantitative hCG elevated at 20.  Informed patient that she needs to follow-up with her OB/GYN at the health department for recheck of her hCG in 48 hours, as well as to establish obstetric care.  No role for pelvic ultrasound at this time, as patient is not having any pain, nausea, or vaginal discharge. Patient requesting ultrasound to "I want to see my baby".  I explained explained to patient that there is no role for ultrasound at this time, as we would not be able to see a fetus in the uterus until hCG is a minimum of 1500.  No further work-up is warranted in the emergency department at this time.    Melissa [name] voiced understanding of her medical evaluation and treatment plan.  Her questions were answered to her expressed satisfaction.  Return precautions were given.  Patient is well-appearing, stable, and appropriate for discharge at this time.  Final Clinical Impression(s) / ED Diagnoses Final diagnoses:  Pregnancy test positive    Rx / DC Orders ED Discharge Orders    None       Paris Lore, PA-C 08/21/20 0901  Terrilee Files, MD 08/21/20 [phone]

## 2020-08-27 NOTE — L&D Delivery Note (Signed)
Obstetrical Delivery Note   Date of Delivery:   04/25/2021 Primary OB:   [facility] Gestational Age/EDD: [redacted]w[redacted]d  Reason for Admission: Scheduled IOL Antepartum complications: FGR. fetal Tetralolgy of Fallot. Schizoaffective disorder. GBS positive  Delivered By:   Cornelia Copa MD  Delivery Type:   vacuum, low  Delivery Details:   Called to see patient for increased pain and pressure. SVE was cervix completely dilated with BBOW. Patient amenable to AROM. NICU team called and assembled and AROM performed for clear fluid. Patient not pushing well at all and I recommend VAVD which she was amenable to. Foley cathether removed. Fetus DOA and Kiwi vacuum placed at flexion point and used per manufacturer's instructions and patient delivered with next contraction from +3.   Anesthesia:    epidural Intrapartum complications: Psychiatric issues GBS:    Positive   Laceration:    "Skid marks" on bilateral labia minora; left one repaired at 3-0 vicryl with figure of eight stitch Episiotomy:    none Rectal exam:   deferred Placenta:    Delivered and expressed via active management. Intact: yes. To pathology: yes.  Delayed Cord Clamping: yes Estimated Blood Loss:   Baby:    Liveborn female, APGARs 8/9, weight 2530gm  Cornelia Copa. MD Attending [facility])

## 2020-09-10 ENCOUNTER — Encounter (HOSPITAL_COMMUNITY): Payer: Self-pay | Admitting: Obstetrics & Gynecology

## 2020-09-10 ENCOUNTER — Other Ambulatory Visit: Payer: Self-pay

## 2020-09-10 ENCOUNTER — Inpatient Hospital Stay (HOSPITAL_COMMUNITY): Payer: Medicaid Other

## 2020-09-10 ENCOUNTER — Inpatient Hospital Stay (HOSPITAL_COMMUNITY)
Admission: AD | Admit: 2020-09-10 | Discharge: 2020-09-10 | Disposition: A | Discharge status: Home / Self Care | Payer: Medicaid Other | Attending: Obstetrics & Gynecology | Admitting: Obstetrics & Gynecology

## 2020-09-10 DIAGNOSIS — O468X1 Other antepartum hemorrhage, first trimester: Secondary | ICD-10-CM

## 2020-09-10 DIAGNOSIS — O208 Other hemorrhage in early pregnancy: Secondary | ICD-10-CM | POA: Diagnosis not present

## 2020-09-10 DIAGNOSIS — O26891 Other specified pregnancy related conditions, first trimester: Secondary | ICD-10-CM

## 2020-09-10 DIAGNOSIS — IMO0001 Reserved for inherently not codable concepts without codable children: Secondary | ICD-10-CM

## 2020-09-10 DIAGNOSIS — R109 Unspecified abdominal pain: Secondary | ICD-10-CM | POA: Diagnosis not present

## 2020-09-10 DIAGNOSIS — O418X10 Other specified disorders of amniotic fluid and membranes, first trimester, not applicable or unspecified: Secondary | ICD-10-CM

## 2020-09-10 DIAGNOSIS — Z3A01 Less than 8 weeks gestation of pregnancy: Secondary | ICD-10-CM | POA: Diagnosis not present

## 2020-09-10 DIAGNOSIS — O26899 Other specified pregnancy related conditions, unspecified trimester: Secondary | ICD-10-CM

## 2020-09-10 DIAGNOSIS — Z3491 Encounter for supervision of normal pregnancy, unspecified, first trimester: Secondary | ICD-10-CM

## 2020-09-10 HISTORY — DX: Bipolar disorder, current episode mixed, unspecified: F31.60

## 2020-09-10 HISTORY — DX: Post-traumatic stress disorder, unspecified: F43.10

## 2020-09-10 HISTORY — DX: Anxiety disorder, unspecified: F41.9

## 2020-09-10 LAB — WET PREP, GENITAL
Clue Cells Wet Prep HPF POC: NONE SEEN
Sperm: NONE SEEN
Trich, Wet Prep: NONE SEEN
Yeast Wet Prep HPF POC: NONE SEEN

## 2020-09-10 LAB — URINALYSIS, ROUTINE W REFLEX MICROSCOPIC
Bacteria, UA: NONE SEEN
Bilirubin Urine: NEGATIVE
Glucose, UA: NEGATIVE mg/dL
Hgb urine dipstick: NEGATIVE
Ketones, ur: 20 mg/dL — AB
Leukocytes,Ua: NEGATIVE
Nitrite: NEGATIVE
Protein, ur: 30 mg/dL — AB
Specific Gravity, Urine: 1.026 (ref 1.005–1.030)
pH: 5 (ref 5.0–8.0)

## 2020-09-10 LAB — ABO/RH: ABO/RH(D): B POS

## 2020-09-10 LAB — CBC
HCT: 36.4 % (ref 36.0–46.0)
Hemoglobin: 12.6 g/dL (ref 12.0–15.0)
MCH: 29.2 pg (ref 26.0–34.0)
MCHC: 34.6 g/dL (ref 30.0–36.0)
MCV: 84.5 fL (ref 80.0–100.0)
Platelets: 305 10*3/uL (ref 150–400)
RBC: 4.31 MIL/uL (ref 3.87–5.11)
RDW: 13.1 % (ref 11.5–15.5)
WBC: 6.1 10*3/uL (ref 4.0–10.5)
nRBC: 0 % (ref 0.0–0.2)

## 2020-09-10 LAB — HCG, QUANTITATIVE, PREGNANCY: hCG, Beta Chain, Quant, S: 63155 m[IU]/mL — ABNORMAL HIGH (ref <5)

## 2020-09-10 MED ORDER — PRENATAL 27-0.8 MG PO TABS: 1.0000 | ORAL_TABLET | Freq: Every day | ORAL | 11 refills | Status: DC

## 2020-09-10 NOTE — MAU Note (Signed)
Melissa [name] is a 25 y.o. here in MAU reporting: pt arrived via EMS complaining of abdominal cramping. Pain is intermittent and occurs about 3 times per day. The other day had bleeding after sex but none since then.  Onset of complaint: ongoing  Pain score: 2/10  Vitals:   09/10/20 1204  BP: 107/81  Pulse: 99  Resp: 18  Temp: 99 F (37.2 C)  SpO2: 100%     Lab orders placed from triage: UA

## 2020-09-10 NOTE — MAU Provider Note (Signed)
History     CSN: [id number]  Arrival date and time: 09/10/20 1142   Event Date/Time   First Provider Initiated Contact with Patient 09/10/20 1243      Chief Complaint  Patient presents with  . Abdominal Pain   HPI Melissa [name] is a 25 y.o. G2P0010 at [redacted]w[redacted]d who presents via EMS for abdominal pain. She states she has been ongoing abdominal pain that she rates a 2/10 for several weeks. She states she has also seen some bleeding but none today. She is requesting an STD check.   OB History    Gravida  2   Para  0   Term  0   Preterm  0   AB  1   Living  0     SAB  1   IAB  0   Ectopic  0   Multiple  0   Live Births  0           Past Medical History:  Diagnosis Date  . Anxiety   . Asthma   . Bipolar 1 disorder (HCC)   . Bipolar affective, mixed (HCC)   . Collapsed lung 06/11/2020  . Depression   . Post traumatic stress disorder (PTSD) 2016  . PTSD (post-traumatic stress disorder)   . PTSD (post-traumatic stress disorder)     Past Surgical History:  Procedure Laterality Date  . chest tube  06/11/2020  . NO PAST SURGERIES      Family History  Problem Relation Age of Onset  . Asthma Mother   . Hypertension Father     Social History   Tobacco Use  . Smoking status: Never Smoker  . Smokeless tobacco: Never Used  Vaping Use  . Vaping Use: Never used  Substance Use Topics  . Alcohol use: No  . Drug use: Yes    Frequency: 4.0 times per week    Types: Marijuana    Allergies: No Known Allergies  Medications Prior to Admission  Medication Sig Dispense Refill Last Dose  . acetaminophen (TYLENOL) 325 MG tablet Take 2 tablets (650 mg total) by mouth every 6 (six) hours as needed.     Marland Kitchen oxyCODONE (OXY IR/ROXICODONE) 5 MG immediate release tablet Take 1 tablet (5 mg total) by mouth every 4 (four) hours as needed for severe pain or breakthrough pain. 15 tablet 0     Review of Systems  Constitutional: Negative.  Negative for fatigue and  fever.  HENT: Negative.   Respiratory: Negative.  Negative for shortness of breath.   Cardiovascular: Negative.  Negative for chest pain.  Gastrointestinal: Negative.  Negative for abdominal pain, constipation, diarrhea, nausea and vomiting.  Genitourinary: Negative.  Negative for dysuria.  Neurological: Negative.  Negative for dizziness and headaches.   Physical Exam   Blood pressure 107/81, pulse 99, temperature 99 F (37.2 C), temperature source Oral, resp. rate 18, height 5\' 8"  (1.727 m), weight 74.6 kg, last menstrual period 07/27/2020, SpO2 100 %.  Physical Exam Vitals and nursing note reviewed.  Constitutional:      General: She is not in acute distress.    Appearance: She is well-developed and well-nourished.  HENT:     Head: Normocephalic.  Eyes:     Pupils: Pupils are equal, round, and reactive to light.  Cardiovascular:     Rate and Rhythm: Normal rate and regular rhythm.     Heart sounds: Normal heart sounds.  Pulmonary:     Effort: Pulmonary effort is normal. No respiratory  distress.     Breath sounds: Normal breath sounds.  Abdominal:     General: Bowel sounds are normal. There is no distension.     Palpations: Abdomen is soft.     Tenderness: There is no abdominal tenderness.  Skin:    General: Skin is warm and dry.  Neurological:     Mental Status: She is alert and oriented to person, place, and time.  Psychiatric:        Mood and Affect: Mood and affect normal.        Behavior: Behavior normal.        Thought Content: Thought content normal.        Judgment: Judgment normal.     MAU Course  Procedures Results for orders placed or performed during the hospital encounter of 09/10/20 (from the past 24 hour(s))  CBC     Status: None   Collection Time: 09/10/20 12:18 PM  Result Value Ref Range   WBC 6.1 4.0 - 10.5 K/uL   RBC 4.31 3.87 - 5.11 MIL/uL   Hemoglobin 12.6 12.0 - 15.0 g/dL   HCT 11.9 14.7 - 82.9 %   MCV 84.5 80.0 - 100.0 fL   MCH 29.2 26.0  - 34.0 pg   MCHC 34.6 30.0 - 36.0 g/dL   RDW 56.2 13.0 - 86.5 %   Platelets 305 150 - 400 K/uL   nRBC 0.0 0.0 - 0.2 %  hCG, quantitative, pregnancy     Status: Abnormal   Collection Time: 09/10/20 12:18 PM  Result Value Ref Range   hCG, Beta Chain, Quant, S 63,155 (H) <5 mIU/mL  ABO/Rh     Status: None   Collection Time: 09/10/20 12:18 PM  Result Value Ref Range   ABO/RH(D) B POS    No rh immune globuloin      NOT A RH IMMUNE GLOBULIN CANDIDATE, PT RH POSITIVE Performed at [facility], [address], [place], [place] [address]   Wet prep, genital     Status: Abnormal   Collection Time: 09/10/20 12:35 PM   Specimen: Vaginal  Result Value Ref Range   Yeast Wet Prep HPF POC NONE SEEN NONE SEEN   Trich, Wet Prep NONE SEEN NONE SEEN   Clue Cells Wet Prep HPF POC NONE SEEN NONE SEEN   WBC, Wet Prep HPF POC RARE (A) NONE SEEN   Sperm NONE SEEN    US OB Comp Less 14 Wks  Result Date: 09/10/2020 CLINICAL DATA:  25 year old pregnant female presents with abdominal pain. Quantitative beta HCG 63,155. EDC by LMP: 05/03/2021, projecting to an expected gestational age of [redacted] weeks 3 days EXAM: OBSTETRIC <14 WK ULTRASOUND TECHNIQUE: Transabdominal ultrasound was performed for evaluation of the gestation as well as the maternal uterus and adnexal regions. COMPARISON:  No prior scans from this gestation. FINDINGS: Intrauterine gestational sac: Single Yolk sac:  Visualized. Embryo:  Visualized. Cardiac Activity: Visualized. Heart Rate: 128 bpm CRL:   5.3 mm   6 w 1 d                  [place] EDC: 05/05/2021 Subchorionic hemorrhage: Possible small perigestational bleed in the posterior uterine cavity involving less than 30% of the gestational sac circumference. Maternal uterus/adnexae: Right ovary measures 3.4 x 2.3 x 2.3 cm. Left ovary measures 4.7 x 2.6 x 2.2 cm. No abnormal ovarian or adnexal masses. IMPRESSION: 1. Single living intrauterine gestation at 6 weeks 1 day by crown-rump length, concordant  with  provided menstrual dating. Possible small perigestational bleed. Normal embryonic cardiac activity. 2. No ovarian or adnexal abnormality. Electronically Signed   By: Delbert Phenix M.D.   On: 09/10/2020 14:54   MDM UA, UPT CBC, HCG ABO/Rh- B Pos Wet prep and gc/chlamydia US OB Comp Less 14 weeks with Transvaginal   Assessment and Plan   1. Normal intrauterine pregnancy on prenatal ultrasound in first trimester   2. Abdominal pain affecting pregnancy   3. [redacted] weeks gestation of pregnancy   4. Subchorionic hemorrhage of placenta in first trimester, single or unspecified fetus    -Discharge home in stable condition -Rx for prenatal vitamins sent to patient's pharmacy -First trimester and subchorionic hemorrhage precautions discussed -Patient advised to follow-up with OB to establish prenatal care -Patient may return to MAU as needed or if her condition were to change or worsen   Rolm Bookbinder CNM 09/10/2020, 12:43 PM

## 2020-09-10 NOTE — Discharge Instructions (Signed)
Prenatal Care Providers           [facility]  [address] [phone]  [facility] @ [facility]   [address]  [phone]  [facility] @ [facility]       [address] [phone]            [facility] for [facility] @ [facility]     [phone]          [facility] for [facility] @ [facility]   [address] [phone]  [facility] for [facility]  [address] [phone]     [facility] for [facility] @ Family [address] [place])  [address]   [phone]     [facility]  Phone: [phone]  [facility]  Phone: [phone]  [facility] Phone: [phone]  [facility] Phone: [phone]  [facility] Phone: [phone]  [facility] Phone: [phone]  [facility]  Phone: [phone]  Safe Medications in Pregnancy   Acne: Benzoyl Peroxide Salicylic Acid  Backache/Headache: Tylenol: 2 regular strength every 4 hours OR              2 Extra strength every 6 hours  Colds/Coughs/Allergies: Benadryl (alcohol free) 25 mg every 6 hours as needed Breath right strips Claritin Cepacol throat lozenges Chloraseptic throat spray Cold-Eeze- up to three times per day Cough drops, alcohol free Flonase (by prescription only) Guaifenesin Mucinex Robitussin DM (plain only, alcohol free) Saline nasal spray/drops Sudafed (pseudoephedrine) & Actifed ** use only after [redacted] weeks gestation and if you do not have high blood pressure Tylenol Vicks Vaporub Zinc lozenges Zyrtec   Constipation: Colace Ducolax suppositories Fleet enema Glycerin suppositories Metamucil Milk of magnesia Miralax Senokot Smooth move tea  Diarrhea: Kaopectate Imodium A-D  *NO pepto  Bismol  Hemorrhoids: Anusol Anusol HC Preparation H Tucks  Indigestion: Tums Maalox Mylanta Zantac  Pepcid  Insomnia: Benadryl (alcohol free) 25mg  every 6 hours as needed Tylenol PM Unisom, no Gelcaps  Leg Cramps: Tums MagGel  Nausea/Vomiting:  Bonine Dramamine Emetrol Ginger extract Sea bands Meclizine  Nausea medication to take during pregnancy:  Unisom (doxylamine succinate 25 mg tablets) Take one tablet daily at bedtime. If symptoms are not adequately controlled, the dose can be increased to a maximum recommended dose of two tablets daily (1/2 tablet in the morning, 1/2 tablet mid-afternoon and one at bedtime). Vitamin B6 100mg  tablets. Take one tablet twice a day (up to 200 mg per day).  Skin Rashes: Aveeno products Benadryl cream or 25mg  every 6 hours as needed Calamine Lotion 1% cortisone cream  Yeast infection: Gyne-lotrimin 7 Monistat 7   **If taking multiple medications, please check labels to avoid duplicating the same active ingredients **take medication as directed on the label ** Do not exceed 4000 mg of tylenol in 24 hours **Do not take medications that contain aspirin or ibuprofen     Obstetrics: Normal and Problem Pregnancies (7th ed., pp. 102-121). [place], [place]: Elsevier."> Textbook of Family Medicine (9th ed., pp. [phone]). [place], [place]: Elsevier [employer].">  First Trimester of Pregnancy  The first trimester of pregnancy starts on the first day of your last menstrual period until the end of week 12. This is months 1 through 3 of pregnancy. A week after a sperm fertilizes an egg, the egg will  implant into the wall of the uterus and begin to develop into a baby. By the end of 12 weeks, all the baby's organs will be formed and the baby will be 2-3 inches in size. Body changes during your first trimester Your body goes through many changes during pregnancy. The changes vary and generally return to normal after your baby is  born. Physical changes  You may gain or lose weight.  Your breasts may begin to grow larger and become tender. The tissue that surrounds your nipples (areola) may become darker.  Dark spots or blotches (chloasma or mask of pregnancy) may develop on your face.  You may have changes in your hair. These can include thickening or thinning of your hair or changes in texture. Health changes  You may feel nauseous, and you may vomit.  You may have heartburn.  You may develop headaches.  You may develop constipation.  Your gums may bleed and may be sensitive to brushing and flossing. Other changes  You may tire easily.  You may urinate more often.  Your menstrual periods will stop.  You may have a loss of appetite.  You may develop cravings for certain kinds of food.  You may have changes in your emotions from day to day.  You may have more vivid and strange dreams. Follow these instructions at home: Medicines  Follow your health care provider's instructions regarding medicine use. Specific medicines may be either safe or unsafe to take during pregnancy. Do not take any medicines unless told to by your health care provider.  Take a prenatal vitamin that contains at least 600 micrograms (mcg) of folic acid. Eating and drinking  Eat a healthy diet that includes fresh fruits and vegetables, whole grains, good sources of protein such as meat, eggs, or tofu, and low-fat dairy products.  Avoid raw meat and unpasteurized juice, milk, and cheese. These carry germs that can harm you and your baby.  If you feel nauseous or you vomit: ? Eat 4 or 5 small meals a day instead of 3 large meals. ? Try eating a few soda crackers. ? Drink liquids between meals instead of during meals.  You may need to take these actions to prevent or treat constipation: ? Drink enough fluid to keep your urine pale yellow. ? Eat foods that are high in fiber, such as beans, whole grains, and fresh fruits  and vegetables. ? Limit foods that are high in fat and processed sugars, such as fried or sweet foods. Activity  Exercise only as directed by your health care provider. Most people can continue their usual exercise routine during pregnancy. Try to exercise for 30 minutes at least 5 days a week.  Stop exercising if you develop pain or cramping in the lower abdomen or lower back.  Avoid exercising if it is very hot or humid or if you are at high altitude.  Avoid heavy lifting.  If you choose to, you may have sex unless your health care provider tells you not to. Relieving pain and discomfort  Wear a good support bra to relieve breast tenderness.  Rest with your legs elevated if you have leg cramps or low back pain.  If you develop bulging veins (varicose veins) in your legs: ? Wear support hose as told by your health care provider. ? Elevate your feet for 15 minutes, 3-4 times a day. ? Limit salt in your diet. Safety  Wear your seat belt at all times when driving or riding in  a car.  Talk with your health care provider if someone is verbally or physically abusive to you.  Talk with your health care provider if you are feeling sad or have thoughts of hurting yourself. Lifestyle  Do not use hot tubs, steam rooms, or saunas.  Do not douche. Do not use tampons or scented sanitary pads.  Do not use herbal remedies, alcohol, illegal drugs, or medicines that are not approved by your health care provider. Chemicals in these products can harm your baby.  Do not use any products that contain nicotine or tobacco, such as cigarettes, e-cigarettes, and chewing tobacco. If you need help quitting, ask your health care provider.  Avoid cat litter boxes and soil used by cats. These carry germs that can cause birth defects in the baby and possibly loss of the unborn baby (fetus) by miscarriage or stillbirth. General instructions  During routine prenatal visits in the first trimester, your  health care provider will do a physical exam, perform necessary tests, and ask you how things are going. Keep all follow-up visits. This is important.  Ask for help if you have counseling or nutritional needs during pregnancy. Your health care provider can offer advice or refer you to specialists for help with various needs.  Schedule a dentist appointment. At home, brush your teeth with a soft toothbrush. Floss gently.  Write down your questions. Take them to your prenatal visits. Where to find more information  American Pregnancy Association: americanpregnancy.org  [employer] of [employer]st[employer]ians and Gynecologists: https://www.todd-brady.net/  Office on [employer] Health: MightyReward.co.nz Contact a health care provider if you have:  Dizziness.  A fever.  Mild pelvic cramps, pelvic pressure, or nagging pain in the abdominal area.  Nausea, vomiting, or diarrhea that lasts for 24 hours or longer.  A bad-smelling vaginal discharge.  Pain when you urinate.  Known exposure to a contagious illness, such as chickenpox, measles, Zika virus, HIV, or hepatitis. Get help right away if you have:  Spotting or bleeding from your vagina.  Severe abdominal cramping or pain.  Shortness of breath or chest pain.  Any kind of trauma, such as from a fall or a car crash.  New or increased pain, swelling, or redness in an arm or leg. Summary  The first trimester of pregnancy starts on the first day of your last menstrual period until the end of week 12 (months 1 through 3).  Eating 4 or 5 small meals a day rather than 3 large meals may help to relieve nausea and vomiting.  Do not use any products that contain nicotine or tobacco, such as cigarettes, e-cigarettes, and chewing tobacco. If you need help quitting, ask your health care provider.  Keep all follow-up visits. This is important. This information is not intended to replace advice given to you by your health care  provider. Make sure you discuss any questions you have with your health care provider. Document Revised: 01/20/2020 Document Reviewed: 11/26/2019 Elsevier Patient Education  2021 [employer].

## 2020-09-12 LAB — GC/CHLAMYDIA PROBE AMP (~~LOC~~) NOT AT ARMC
Chlamydia: NEGATIVE
Comment: NEGATIVE
Comment: NORMAL
Neisseria Gonorrhea: NEGATIVE

## 2020-09-26 LAB — OB RESULTS CONSOLE GBS: GBS: POSITIVE

## 2020-09-26 LAB — OB RESULTS CONSOLE GC/CHLAMYDIA
Chlamydia: NEGATIVE
Gonorrhea: NEGATIVE

## 2020-09-26 LAB — OB RESULTS CONSOLE HEPATITIS B SURFACE ANTIGEN: Hepatitis B Surface Ag: NEGATIVE

## 2020-09-26 LAB — OB RESULTS CONSOLE ABO/RH: RH Type: POSITIVE

## 2020-09-26 LAB — OB RESULTS CONSOLE ANTIBODY SCREEN: Antibody Screen: NEGATIVE

## 2020-09-26 LAB — OB RESULTS CONSOLE HIV ANTIBODY (ROUTINE TESTING): HIV: NONREACTIVE

## 2020-09-26 LAB — OB RESULTS CONSOLE RPR: RPR: NONREACTIVE

## 2020-09-26 LAB — OB RESULTS CONSOLE RUBELLA ANTIBODY, IGM: Rubella: IMMUNE

## 2020-09-27 ENCOUNTER — Other Ambulatory Visit: Payer: Self-pay | Admitting: Family Medicine

## 2020-09-27 DIAGNOSIS — Z369 Encounter for antenatal screening, unspecified: Secondary | ICD-10-CM

## 2020-09-28 ENCOUNTER — Ambulatory Visit: Attending: Internal Medicine | Primary: Internal Medicine

## 2020-09-28 ENCOUNTER — Ambulatory Visit: Admit: 2020-09-28 | Payer: PRIVATE HEALTH INSURANCE | Attending: Internal Medicine | Primary: Internal Medicine

## 2020-09-28 DIAGNOSIS — Z0000 Encounter for general adult medical examination without abnormal findings: Secondary | ICD-10-CM

## 2020-09-28 MED ORDER — PANTOPRAZOLE 40 MG TAB, DELAYED RELEASE
40 mg | ORAL_TABLET | Freq: Every day | ORAL | 1 refills | Status: AC | PRN
Start: 2020-09-28 — End: ?

## 2020-09-28 MED ORDER — ALBUTEROL SULFATE HFA 90 MCG/ACTUATION AEROSOL INHALER
90 mcg/actuation | Freq: Four times a day (QID) | RESPIRATORY_TRACT | 2 refills | Status: AC | PRN
Start: 2020-09-28 — End: ?

## 2020-09-28 NOTE — Progress Notes (Signed)
 Rm    Chief Complaint   Patient presents with   . Physical        Visit Vitals  BP 118/78 (BP 1 Location: Left upper arm, BP Patient Position: Sitting, BP Cuff Size: Adult)   Pulse (!) 101   Temp 98.3 F (36.8 C) (Oral)   Resp 18   Ht 5' (1.524 m)   Wt 151 lb (68.5 kg)   LMP 09/04/2020   SpO2 98%   BMI 29.49 kg/m        1. Have you been to the ER, urgent care clinic since your last visit?  Hospitalized since your last visit?No    2. Have you seen or consulted any other health care providers outside of the [facility] since your last visit?  Include any pap smears or colon screening. No     Health Maintenance Due   Topic Date Due   . Hepatitis C Screening  Never done   . COVID-19 Vaccine (1) Never done   . Pneumococcal 0-64 years (1 of 2 - PPSV23) Never done   . HPV Age 9Y-26Y (2 - 3-dose series) 09/11/2013   . DTaP/Tdap/Td series (1 - Tdap) Never done   . Pap Smear  Never done   . Depression Screen  03/29/2020   . Flu Vaccine (1) Never done        3 most recent PHQ Screens 09/28/2020   Little interest or pleasure in doing things Not at all   Feeling down, depressed, irritable, or hopeless Not at all   Total Score PHQ 2 0        Fall Risk Assessment, last 12 mths 03/30/2019   Able to walk? Yes   Fall in past 12 months? No       No flowsheet data found.

## 2020-09-28 NOTE — Progress Notes (Signed)
Hgb is low, 10.6.  Notify of any s/sx of bleeding. Is patient experiencing heavy menstrual cycles? Please add iron profile and ferritin if able.    LDL cholesterol mildly elevated. Recommend that patient watch diet for fatty foods and exercise as tolerated.     Vitamin D is very low.Vitamin D 50,000 units weekly x 12 weeks.  After completion of 12 weeks, recommend OTC Vitamin D3 1000 units daily.    Otherwise, stable labs

## 2020-09-28 NOTE — Progress Notes (Signed)
HISTORY OF PRESENT ILLNESS  Anna Frazier is a 25 y.o. female here for complete physical.  She drinks alcohol.  Has gastritis from endoscopy.  Taking Protonix as needed.  Need refill.  She is drinking hard liquor, trying to cut back on it.  Feeling sad and depressed some days.  Would like to talk to therapist.  Otherwise active, working full-time.  Received Covid vaccine, refused all other vaccines.  HPI      Review of Systems   Constitutional: Negative.    HENT: Negative.    Eyes: Negative.    Respiratory: Negative.    Cardiovascular: Negative.    Gastrointestinal: Negative.    Genitourinary: Negative.    Musculoskeletal: Negative.    Skin: Negative.    Neurological: Negative.    Endo/Heme/Allergies: Negative.    Psychiatric/Behavioral: Negative.        Physical Exam  Constitutional:       Appearance: Normal appearance.   HENT:      Head: Normocephalic and atraumatic.      Right Ear: Tympanic membrane normal.      Left Ear: Tympanic membrane normal.      Nose: Nose normal.   Eyes:      Pupils: Pupils are equal, round, and reactive to light.   Cardiovascular:      Rate and Rhythm: Normal rate and regular rhythm.      Pulses: Normal pulses.      Heart sounds: Normal heart sounds.   Pulmonary:      Effort: Pulmonary effort is normal.      Breath sounds: Normal breath sounds.   Abdominal:      General: Abdomen is flat. Bowel sounds are normal.      Palpations: Abdomen is soft.   Musculoskeletal:         General: Normal range of motion.      Cervical back: Normal range of motion and neck supple.   Skin:     General: Skin is warm.   Neurological:      General: No focal deficit present.      Mental Status: She is alert and oriented to person, place, and time. Mental status is at baseline.   Psychiatric:         Mood and Affect: Mood normal.         Behavior: Behavior normal.         Thought Content: Thought content normal.      Comments: Sad, she does not believe she is depressed.         ASSESSMENT and PLAN  Diagnoses  and all orders for this visit:    1. Routine general medical examination at a health care facility    Seems healthy.  Advised to eat healthy and exercise.  Will check,  -     CBC WITH AUTOMATED DIFF  -     METABOLIC PANEL, COMPREHENSIVE  -     TSH 3RD GENERATION  -     LIPID PANEL  -     URINALYSIS W/ REFLEX CULTURE    2. Asthma    We will give,  -     albuterol (ProAir HFA) 90 mcg/actuation inhaler; Take 2 Puffs by inhalation every six (6) hours as needed for Wheezing. Indications: asthma attack    3. Gastritis without bleeding, unspecified chronicity, unspecified gastritis type    Might be alcohol induced.  She had endoscopy done which showed gastritis.  Advised to avoid spicy food.  Will give,  -  pantoprazole (PROTONIX) 40 mg tablet; Take 1 Tablet by mouth daily as needed (heart burn).    4. Alcohol dependence with unspecified alcohol-induced disorder (HCC)    She she drinks hard liquor.  Advised her to cut back on drinking.   She is advised to take a B complex vitamin every day.   Will check,  -     VITAMIN B12    5. B12 deficiency  -     VITAMIN B12    6. Anhedonia  She do not believe she is depressed.  She is advised to talk to therapist.  7. Vitamin D deficiency  -     VITAMIN D, 25 HYDROXY    8. Need for hepatitis C screening test  -     HEPATITIS C AB    Discussed expected course/resolution/complications of diagnosis in detail with patient.   Medication risks/benefits/costs/interactions/alternatives discussed with patient.   Discussed COVID-19 infection precaution with patient.  Pt was given an after visit summary which includes diagnoses, current medications & vitals.   Pt expressed understanding with the diagnosis and plan.

## 2020-09-29 LAB — METABOLIC PANEL, COMPREHENSIVE
A-G Ratio: 1 — ABNORMAL LOW (ref 1.2–2.2)
ALT (SGPT): 10 IU/L (ref 0–32)
AST (SGOT): 9 IU/L (ref 0–40)
Albumin: 4 g/dL (ref 3.9–5.0)
Alk. phosphatase: 79 IU/L (ref 44–121)
BUN/Creatinine ratio: 10 (ref 9–23)
BUN: 8 mg/dL (ref 6–20)
Bilirubin, total: 0.3 mg/dL (ref 0.0–1.2)
CO2: 20 mmol/L (ref 20–29)
Calcium: 9 mg/dL (ref 8.7–10.2)
Chloride: 104 mmol/L (ref 96–106)
Creatinine: 0.84 mg/dL (ref 0.57–1.00)
GFR est AA: 111 mL/min/{1.73_m2} (ref >59)
GFR est non-AA: 96 mL/min/{1.73_m2} (ref >59)
GLOBULIN, TOTAL: 3.9 g/dL (ref 1.5–4.5)
Glucose: 87 mg/dL (ref 65–99)
Potassium: 4.3 mmol/L (ref 3.5–5.2)
Protein, total: 7.9 g/dL (ref 6.0–8.5)
Sodium: 139 mmol/L (ref 134–144)

## 2020-09-29 LAB — CBC WITH AUTOMATED DIFF
ABS. BASOPHILS: 0 10*3/uL (ref 0.0–0.2)
ABS. EOSINOPHILS: 0 10*3/uL (ref 0.0–0.4)
ABS. IMM. GRANS.: 0 10*3/uL (ref 0.0–0.1)
ABS. MONOCYTES: 0.7 10*3/uL (ref 0.1–0.9)
ABS. NEUTROPHILS: 6.3 10*3/uL (ref 1.4–7.0)
Abs Lymphocytes: 1.5 10*3/uL (ref 0.7–3.1)
BASOPHILS: 0 %
EOSINOPHILS: 0 %
HCT: 34.8 % (ref 34.0–46.6)
HGB: 10.6 g/dL — ABNORMAL LOW (ref 11.1–15.9)
IMMATURE GRANULOCYTES: 1 %
Lymphocytes: 18 %
MCH: 25.7 pg — ABNORMAL LOW (ref 26.6–33.0)
MCHC: 30.5 g/dL — ABNORMAL LOW (ref 31.5–35.7)
MCV: 84 fL (ref 79–97)
MONOCYTES: 8 %
NEUTROPHILS: 73 %
PLATELET: 395 10*3/uL (ref 150–450)
RBC: 4.13 x10E6/uL (ref 3.77–5.28)
RDW: 15.6 % — ABNORMAL HIGH (ref 11.7–15.4)
WBC: 8.6 10*3/uL (ref 3.4–10.8)

## 2020-09-29 LAB — LIPID PANEL
Cholesterol, Total: 166 mg/dL (ref 100–199)
Cholesterol, total: 166 mg/dL (ref 100–199)
HDL Cholesterol: 42 mg/dL (ref >39)
HDL: 42 mg/dL (ref >39)
LDL Calculated: 116 mg/dL — ABNORMAL HIGH (ref 0–99)
LDL, calculated: 116 mg/dL — ABNORMAL HIGH (ref 0–99)
Triglyceride: 40 mg/dL (ref 0–149)
Triglycerides: 40 mg/dL (ref 0–149)
VLDL, calculated: 8 mg/dL (ref 5–40)
VLDL: 8 mg/dL (ref 5–40)

## 2020-09-29 LAB — URINALYSIS W/ REFLEX CULTURE
Bilirubin, Urine: NEGATIVE
Bilirubin: NEGATIVE
Blood, Urine: NEGATIVE
Blood: NEGATIVE
Glucose, UA: NEGATIVE
Glucose: NEGATIVE
Ketone: NEGATIVE
Ketones, Urine: NEGATIVE
Leukocyte Esterase, Urine: NEGATIVE
Leukocyte Esterase: NEGATIVE
Nitrite, Urine: NEGATIVE
Nitrites: NEGATIVE
Specific Gravity, UA: 1.025 NA (ref 1.005–1.030)
Specific Gravity: 1.025 (ref 1.005–1.030)
Urobilinogen, Urine: 1 mg/dL (ref 0.2–1.0)
Urobilinogen: 1 mg/dL (ref 0.2–1.0)
pH (UA): 7.5 (ref 5.0–7.5)
pH, UA: 7.5 NA (ref 5.0–7.5)

## 2020-09-29 LAB — MICROSCOPIC EXAMINATION
Casts UA: NONE SEEN /lpf
Casts: NONE SEEN /lpf
RBC, UA: NONE SEEN /hpf (ref 0–2)
RBC: NONE SEEN /hpf (ref 0–2)
WBC, UA: NONE SEEN /hpf (ref 0–5)
WBC: NONE SEEN /hpf (ref 0–5)

## 2020-09-29 LAB — VITAMIN D, 25 HYDROXY: VITAMIN D, 25-HYDROXY: <4 ng/mL — ABNORMAL LOW (ref 30.0–100.0)

## 2020-09-29 LAB — HEPATITIS C AB: Hep C Virus Ab: <0.1 s/co ratio (ref 0.0–0.9)

## 2020-09-29 LAB — VITAMIN B12
Vitamin B-12: 278 pg/mL (ref 232–1245)
Vitamin B12: 278 pg/mL (ref 232–1245)

## 2020-09-29 LAB — CVD REPORT

## 2020-09-29 LAB — TSH 3RD GENERATION
TSH: 1.66 u[IU]/mL (ref 0.450–4.500)
TSH: 1.66 u[IU]/mL (ref 0.450–4.500)

## 2020-09-29 LAB — CBC WITH AUTO DIFFERENTIAL
Basophils %: 0 %
Basophils Absolute: 0 10*3/uL (ref 0.0–0.2)
Eosinophils %: 0 %
Eosinophils Absolute: 0 10*3/uL (ref 0.0–0.4)
Granulocyte Absolute Count: 0 10*3/uL (ref 0.0–0.1)
Hematocrit: 34.8 % (ref 34.0–46.6)
Hemoglobin: 10.6 g/dL — ABNORMAL LOW (ref 11.1–15.9)
Immature Granulocytes: 1 %
Lymphocytes %: 18 %
Lymphocytes Absolute: 1.5 10*3/uL (ref 0.7–3.1)
MCH: 25.7 pg — ABNORMAL LOW (ref 26.6–33.0)
MCHC: 30.5 g/dL — ABNORMAL LOW (ref 31.5–35.7)
MCV: 84 fL (ref 79–97)
Monocytes %: 8 %
Monocytes Absolute: 0.7 10*3/uL (ref 0.1–0.9)
Neutrophils %: 73 %
Neutrophils Absolute: 6.3 10*3/uL (ref 1.4–7.0)
Platelets: 395 10*3/uL (ref 150–450)
RBC: 4.13 x10E6/uL (ref 3.77–5.28)
RDW: 15.6 % — ABNORMAL HIGH (ref 11.7–15.4)
WBC: 8.6 10*3/uL (ref 3.4–10.8)

## 2020-09-29 LAB — COMPREHENSIVE METABOLIC PANEL
ALT: 10 IU/L (ref 0–32)
AST: 9 IU/L (ref 0–40)
Albumin/Globulin Ratio: 1 NA — ABNORMAL LOW (ref 1.2–2.2)
Albumin: 4 g/dL (ref 3.9–5.0)
Alkaline Phosphatase: 79 IU/L (ref 44–121)
BUN: 8 mg/dL (ref 6–20)
Bun/Cre Ratio: 10 NA (ref 9–23)
CO2: 20 mmol/L (ref 20–29)
Calcium: 9 mg/dL (ref 8.7–10.2)
Chloride: 104 mmol/L (ref 96–106)
Creatinine: 0.84 mg/dL (ref 0.57–1.00)
EGFR IF NonAfrican American: 96 mL/min/{1.73_m2} (ref >59)
GFR African American: 111 mL/min/{1.73_m2} (ref >59)
Globulin, Total: 3.9 g/dL (ref 1.5–4.5)
Glucose: 87 mg/dL (ref 65–99)
Potassium: 4.3 mmol/L (ref 3.5–5.2)
Sodium: 139 mmol/L (ref 134–144)
Total Bilirubin: 0.3 mg/dL (ref 0.0–1.2)
Total Protein: 7.9 g/dL (ref 6.0–8.5)

## 2020-09-29 LAB — HEPATITIS C ANTIBODY: HCV Ab: <0.1 s/co ratio (ref 0.0–0.9)

## 2020-09-29 LAB — VITAMIN D 25 HYDROXY: Vit D, 25-Hydroxy: <4 ng/mL — ABNORMAL LOW (ref 30.0–100.0)

## 2020-10-04 MED ORDER — ERGOCALCIFEROL (VITAMIN D2) 50,000 UNIT CAP
1250 mcg (50,000 unit) | ORAL_CAPSULE | ORAL | 0 refills | Status: AC
Start: 2020-10-04 — End: 2020-12-21

## 2020-10-06 NOTE — Telephone Encounter (Signed)
Call placed to pt to discuss lab results, left VM for call back. Also mailed out letter

## 2020-10-06 NOTE — Telephone Encounter (Signed)
Returned call to pt, left VM for call back

## 2020-10-06 NOTE — Telephone Encounter (Signed)
-----   Message from Bonnetta Barry, NP sent at 10/04/2020  5:15 PM EST -----  Hgb is low, 10.6.  Notify of any s/sx of bleeding. Is patient experiencing heavy menstrual cycles? Please add iron profile and ferritin if able.    LDL cholesterol mildly elevated. Recommend that patient watch diet for fatty foods and exercise as tolerated.     Vitamin D is very low.Vitamin D 50,000 units weekly x 12 weeks.  After completion of 12 weeks, recommend OTC Vitamin D3 1000 units daily.    Otherwise, stable labs

## 2020-10-06 NOTE — Telephone Encounter (Signed)
Karrie please call pt back she has another question

## 2020-10-06 NOTE — Telephone Encounter (Signed)
Pt called back we discussed her lab results and recommendations. She voiced understanding

## 2020-10-07 NOTE — Telephone Encounter (Signed)
Returned call to pt, She states she was able to answer her own question.

## 2020-10-26 ENCOUNTER — Ambulatory Visit: Payer: Medicaid Other

## 2020-10-26 ENCOUNTER — Ambulatory Visit: Payer: Medicaid Other | Admitting: *Deleted

## 2020-10-26 ENCOUNTER — Ambulatory Visit: Payer: Medicaid Other | Attending: Family Medicine

## 2020-10-26 ENCOUNTER — Other Ambulatory Visit: Payer: Self-pay

## 2020-10-26 VITALS — BP 123/84 | HR 99 | Wt 149.8 lb

## 2020-10-26 DIAGNOSIS — Z3A13 13 weeks gestation of pregnancy: Secondary | ICD-10-CM | POA: Diagnosis not present

## 2020-10-26 DIAGNOSIS — Z3682 Encounter for antenatal screening for nuchal translucency: Secondary | ICD-10-CM | POA: Diagnosis not present

## 2020-10-26 DIAGNOSIS — O4592 Premature separation of placenta, unspecified, second trimester: Secondary | ICD-10-CM | POA: Diagnosis not present

## 2020-10-26 DIAGNOSIS — Z363 Encounter for antenatal screening for malformations: Secondary | ICD-10-CM | POA: Diagnosis not present

## 2020-10-26 DIAGNOSIS — Z1379 Encounter for other screening for genetic and chromosomal anomalies: Secondary | ICD-10-CM

## 2020-10-26 DIAGNOSIS — Z369 Encounter for antenatal screening, unspecified: Secondary | ICD-10-CM

## 2020-10-31 ENCOUNTER — Telehealth: Payer: Self-pay | Admitting: Genetic Counselor

## 2020-10-31 LAB — MATERNIT21 PLUS CORE+SCA
Fetal Fraction: 6
Monosomy X (Turner Syndrome): NOT DETECTED
Result (T21): NEGATIVE
Trisomy 13 (Patau syndrome): NEGATIVE
Trisomy 18 (Edwards syndrome): NEGATIVE
Trisomy 21 (Down syndrome): NEGATIVE
XXX (Triple X Syndrome): NOT DETECTED
XXY (Klinefelter Syndrome): NOT DETECTED
XYY (Jacobs Syndrome): NOT DETECTED

## 2020-10-31 NOTE — Telephone Encounter (Signed)
I called Ms. Adebayo to discuss her negative noninvasive prenatal screening (NIPS) result. Specifically, Ms. Benningfield had MaterniT21 NIPS through [employer]. These negative results demonstrated an expected representation of chromosome 21, 18, 13, and sex chromosome material, greatly reducing the likelihood of trisomies 94, 76, or 7 and sex chromosome aneuploidies for the pregnancy. Ms. Erck requested to know about the expected fetal sex, which is female.  NIPS analyzes placental DNA in maternal circulation. NIPS is considered to be highly specific and sensitive, but is not considered to be a diagnostic test. We reviewed that this testing identifies 91-99% of pregnancies with trisomies 54, 8, and 50, as well as sex chromosome aneuploidies, but does not test for all genetic conditions. Diagnostic testing via chorionic villus sampling or amniocentesis is available should Ms. Colquitt be interested in confirming this result. She confirmed that she had no questions about these results at this time. She will return for genetic counseling due to her family history of congenital heart defects this Wednesday at 2:30 PM.  Gershon Crane, MS, [facility] Genetic Counselor

## 2020-11-02 ENCOUNTER — Ambulatory Visit: Payer: Medicaid Other | Attending: Obstetrics & Gynecology | Admitting: Genetic Counselor

## 2020-11-02 ENCOUNTER — Other Ambulatory Visit: Payer: Self-pay

## 2020-11-02 ENCOUNTER — Ambulatory Visit: Payer: Self-pay | Admitting: Genetic Counselor

## 2020-11-02 DIAGNOSIS — Z315 Encounter for genetic counseling: Secondary | ICD-10-CM | POA: Diagnosis not present

## 2020-11-02 DIAGNOSIS — Z8279 Family history of other congenital malformations, deformations and chromosomal abnormalities: Secondary | ICD-10-CM

## 2020-11-02 NOTE — Progress Notes (Signed)
11/02/2020  Melissa [name] 05-08-1996 MRN: [id number] DOV: 11/02/2020  Melissa [name] presented to the [facility] for a genetics consultation regarding her family history of congenital heart defects. Melissa [name] presented to her appointment alone.   Indication for genetic counseling - Family history of congenital heart defects  Prenatal history  Melissa [name] is a G75P0010, 25 y.o. female. Her current pregnancy has completed [redacted]w[redacted]d(Estimated Date of Delivery: 05/03/21). Melissa [name] one prior miscarriage at 187 weeks gestation.  Melissa [name] exposure to environmental toxins or chemical agents. She denied the use of alcohol, tobacco or street drugs. She reported taking prenatal vitamins and antibiotics. She denied significant viral illnesses, fevers, and bleeding during the course of her pregnancy. She reported a personal history of mental illnesses. Her medical and surgical histories were otherwise noncontributory.  Family History  A three generation pedigree was drafted and reviewed. The family history is remarkable for the following:  - Melissa [name]'s brother and father reportedly were born with "holes in their heart". Neither of them have required surgical repair. Melissa [name] and a maternal aunt reportedly have a heart problem that Melissa [name] not have further details on. Her other maternal aunt has congestive heart failure. See Discussion section for more details.  - Melissa [name] a personal and family history of mental illness. She has been diagnosed with anxiety, depression, PTSD, and schizoaffective disorder, bipolar type. Her mother has a history of depression. Her father, a maternal aunt, and a maternal first cousin all have mental illnesses as well.   - Melissa [name] family history is also significant for thyroid problems in her mother and a learning disability in a maternal first cousin.   The remaining family histories  were reviewed and found to be noncontributory for birth defects, intellectual disability, recurrent pregnancy loss, and known genetic conditions. Ms. WPariseauhad limited information about the father of the pregnancy; thus, risk assessment was limited.    The patient's ancestry is African American. The father of the pregnancy's ancestry is African American and "mixed" (no further details known). Ashkenazi Jewish ancestry and consanguinity were denied. Pedigree will be scanned under Media.  Discussion  Family history of congenital heart defects:  Melissa [name] referred for genetic counseling to discuss her family history of congenital heart defects (CHDs) in her brother and father. We reviewed that CHDs can be isolated or a feature of an underlying genetic condition. CHDs are most often multifactorial in etiology, but can also result from chromosome aberrations, single gene conditions, or teratogenic exposures. CHDs occur in ~0.5% of the general population. If Melissa [name] brother and father have CHDs that are nonsyndromic, the risk of recurrence for a CHD in the current fetus would likely be slightly elevated over the general population risk for congenital birth defects (3-5%). If however, there is an underlying genetic condition that caused their CHDs, the chance of recurrence for the fetus could be up to 50% depend on the inheritance of the condition.   Melissa [name] counseled that based on her personal and family histories (see Family History section), it is possible that a genetic condition such as 22q11.2 deletion syndrome could be present in the family. 22q11.2 deletion syndrome, also called DiGeorge syndrome or velocardiofacial syndrome, is caused by the deletion of genes on one end of chromosome 22 (the q11.2 region of the chromosome). Possible features associated with this condition include CHDs, palate abnormalities, immune system dysfunction, hypocalcemia, thyroid problems,  growth  hormone deficiency, feeding difficulties, renal abnormalities, hearing loss, seizures, skeletal abnormalities, distinctive facial features, speech impairment, learning or behavioral differences, and psychiatric illnesses. Symptoms may vary significantly from person to person, even among affected relatives from the same family. Approximately 93% of individuals with 22q11.2 deletion syndrome have a de novo deletion of the 22q11.2 region that occurred for the first time in that individual. Approximately 10% of individuals inherit the deletion from one parent who also carries the deletion of the 22q11.2 region. 22q11.2 deletion syndrome is inherited in an autosomal dominant pattern, meaning that an affected individual has a 1 in 2 (50%) chance to pass on the condition and have an affected child with each pregnancy.  Testing options:  We reviewed that Melissa [name] had MaterniT21 noninvasive prenatal screening (NIPS) that was low-risk for Down syndrome, trisomy 31, trisomy 30, and sex chromosome aneuploidies (see 3/7 telephone note). She was informed that additional analysis for microdeletions such as 22q11.2 deletion syndrome and other chromosomal deletions or duplications could be performed on her blood sample via [employer]. MaterniT Genome is a form of NIPS that provides information about seven clinically relevant microdeletion regions and gains or losses of chromosomal material ? 7 Mb across all chromosomes (rather than a select few). Like MaterniT21 NIPS, MaterniT Genome is not diagnostic. A positive result requires confirmation by amniocentesis, and a negative test result does not rule out all chromosomal deletions/duplications.  Melissa [name] was also counseled regarding diagnostic testing via amniocentesis available from 16 weeks' gestation. We discussed the technical aspects of the procedure and quoted up to a 1 in 500 (0.2%) risk for spontaneous pregnancy loss or other adverse pregnancy outcomes as a  result of amniocentesis. Cultured cells from an amniocentesis sample allow for the visualization of a fetal karyotype, which can detect >99% of large chromosomal aberrations. Chromosomal microarray can also be performed to identify smaller deletions or duplications of fetal chromosomal material.   Finally, Ms. Doxtater was informed that she may follow the pregnancy via routine ultrasounds. Given her family history, it is recommended that she have her anatomy ultrasound performed here in Maternal Fetal Medicine. If there is concern for a CHD in the current pregnancy, she would be referred for a fetal echocardiogram. Postnatal genetic testing is also possible if clinically indicated.  Ms. Gunnerson declined MaterniT Genome today. She also is not interested in pursuing diagnostic testing, preferring instead to only have her routine ultrasounds. She understands that [employer] remains an option throughout pregnancy and that amniocentesis is available at any point after 16 weeks and that she may opt to undergo testing at a later date should she change her mind.  Carrier screening:  Per ACOG recommendation, carrier screening for hemoglobinopathies, cystic fibrosis (CF) and spinal muscular atrophy (SMA) was discussed including information about the conditions, rationale for testing, autosomal recessive inheritance, and the option of prenatal diagnosis. Per records, it appears that Ms. Marzo may have had carrier screening for CF and hemoglobinopathies ordered by her OBGYN provider. However, these results were not available for my review. I offered additional carrier screening for SMA, which Ms. Jalomo declined at this time. Without carrier screening to refine risk and based on ethnicity alone, Ms. Regan's chance of being a carrier for SMA is 1 in 47. Ms. Aispuro was informed that select hemoglobinopathies, CF, and SMA are included on [place]'s newborn screen.   Plan:  Additional screening and  diagnostic testing were declined today. It is recommended that Ms. Caslin have her  anatomy ultrasound performed in Maternal Fetal Medicine based on her family history of CHDs. She was scheduled for this appointment today. Ms. Majkowski understands that screening tests, including ultrasound, cannot rule out all birth defects or genetic syndromes.   I counseled Ms. Wedekind regarding the above risks and available options. The approximate face-to-face time with the genetic counselor was 40 minutes.  In summary:  Reviewed family history concerns  Family history of congenital heart defects  Family history also significant for mental illness, learning disabilities, and thyroid problems  Discussed congenital heart defects, including information about causes (including 22q11.2 deletion syndrome) and recurrence risks  Risk for birth defects may be greater than population risk given family history  Exact recurrence risk depends on underlying cause  Reviewed low-risk NIPS result  Reduction in risk for Down syndrome, trisomy 39, trisomy 23, and sex chromosome aneuploidies  Offered additional testing and screening  Declined MaterniT Genome NIPS add-on, SMA carrier screening, and diagnostic testing  Melissa Manis, MS, Counselling psychologist

## 2020-11-03 ENCOUNTER — Other Ambulatory Visit: Payer: Self-pay | Admitting: *Deleted

## 2020-11-03 DIAGNOSIS — Z8279 Family history of other congenital malformations, deformations and chromosomal abnormalities: Secondary | ICD-10-CM

## 2020-12-07 ENCOUNTER — Other Ambulatory Visit: Payer: Self-pay

## 2020-12-07 ENCOUNTER — Ambulatory Visit: Payer: Medicaid Other | Admitting: *Deleted

## 2020-12-07 ENCOUNTER — Encounter: Payer: Self-pay | Admitting: *Deleted

## 2020-12-07 ENCOUNTER — Ambulatory Visit: Payer: Medicaid Other | Attending: Obstetrics

## 2020-12-07 VITALS — BP 128/69 | HR 90

## 2020-12-07 DIAGNOSIS — Z363 Encounter for antenatal screening for malformations: Secondary | ICD-10-CM | POA: Diagnosis not present

## 2020-12-07 DIAGNOSIS — Z8279 Family history of other congenital malformations, deformations and chromosomal abnormalities: Secondary | ICD-10-CM | POA: Diagnosis not present

## 2020-12-07 DIAGNOSIS — Z3689 Encounter for other specified antenatal screening: Secondary | ICD-10-CM

## 2020-12-07 DIAGNOSIS — Z3A19 19 weeks gestation of pregnancy: Secondary | ICD-10-CM | POA: Diagnosis not present

## 2020-12-20 ENCOUNTER — Telehealth: Payer: Self-pay

## 2020-12-20 NOTE — Telephone Encounter (Signed)
Patient scheduled for a Fetal Echo at [facility] 02/02/21 @ 10am

## 2021-01-30 ENCOUNTER — Inpatient Hospital Stay (HOSPITAL_COMMUNITY)
Admission: AD | Admit: 2021-01-30 | Discharge: 2021-01-30 | Disposition: A | Discharge status: Home / Self Care | Payer: Medicaid Other | Attending: Obstetrics and Gynecology | Admitting: Obstetrics and Gynecology

## 2021-01-30 ENCOUNTER — Encounter (HOSPITAL_COMMUNITY): Payer: Self-pay | Admitting: Obstetrics and Gynecology

## 2021-01-30 ENCOUNTER — Other Ambulatory Visit: Payer: Self-pay

## 2021-01-30 DIAGNOSIS — O26899 Other specified pregnancy related conditions, unspecified trimester: Secondary | ICD-10-CM

## 2021-01-30 DIAGNOSIS — O26892 Other specified pregnancy related conditions, second trimester: Secondary | ICD-10-CM | POA: Diagnosis not present

## 2021-01-30 DIAGNOSIS — S0230XA Fracture of orbital floor, unspecified side, initial encounter for closed fracture: Secondary | ICD-10-CM

## 2021-01-30 DIAGNOSIS — R109 Unspecified abdominal pain: Secondary | ICD-10-CM | POA: Diagnosis not present

## 2021-01-30 DIAGNOSIS — Z3A26 26 weeks gestation of pregnancy: Secondary | ICD-10-CM

## 2021-01-30 HISTORY — DX: Fracture of orbital floor, unspecified side, initial encounter for closed fracture: S02.30XA

## 2021-01-30 LAB — URINALYSIS, ROUTINE W REFLEX MICROSCOPIC
Bilirubin Urine: NEGATIVE
Glucose, UA: NEGATIVE mg/dL
Hgb urine dipstick: NEGATIVE
Ketones, ur: NEGATIVE mg/dL
Leukocytes,Ua: NEGATIVE
Nitrite: NEGATIVE
Protein, ur: NEGATIVE mg/dL
Specific Gravity, Urine: 1.012 (ref 1.005–1.030)
pH: 7 (ref 5.0–8.0)

## 2021-01-30 NOTE — Discharge Instructions (Signed)
Abdominal Pain During Pregnancy Abdominal pain is common during pregnancy and has many possible causes. Some causes are more serious than others, and sometimes the cause is not known. Abdominal pain can be a sign that labor is starting. It can also be caused by normal growth of your baby causing stretching of muscles and ligaments during pregnancy. Always tell your health care provider if you have any abdominal pain. Follow these instructions at home:  Do not have sex or put anything in your vagina until your pain goes away completely.  Get plenty of rest until your pain improves.  Drink enough fluid to keep your urine pale yellow.  Take over-the-counter and prescription medicines only as told by your health care provider.  Keep all follow-up visits. This is important.   Contact a health care provider if:  Your pain continues or gets worse after resting.  You have lower abdominal pain that: ? Comes and goes at regular intervals. ? Spreads to your back. ? Is similar to menstrual cramps.  You have pain or burning when you urinate. Get help right away if:  You have a fever, chills, or shortness of breath.  You have vaginal bleeding.  You are leaking fluid or passing tissue from your vagina.  You have vomiting or diarrhea that lasts for more than 24 hours.  Your baby is moving less than usual.  You feel very weak or faint.  You develop severe pain in your upper abdomen. Summary  Abdominal pain is common during pregnancy and has many possible causes.  If you experience abdominal pain during pregnancy, tell your health care provider right away.  Follow your health care provider's home care instructions and keep all follow-up visits as told. This information is not intended to replace advice given to you by your health care provider. Make sure you discuss any questions you have with your health care provider. Document Revised: 04/26/2020 Document Reviewed: 04/26/2020 Elsevier  Patient Education  2021 Elsevier Inc.  

## 2021-01-30 NOTE — MAU Provider Note (Signed)
History     CSN: [id number]  Arrival date and time: 01/30/21 0320   Event Date/Time   First Provider Initiated Contact with Patient 01/30/21 0402      Chief Complaint  Patient presents with  . Contractions   HPI Melissa [name] is a 25 y.o. G2P0010 at [redacted]w[redacted]d who presents via EMS for abdominal cramping. Symptoms started early this morning after using the bathroom. Reports cramping that was intermittent while in the shower. Episode of vomiting preceded cramping. Abdominal pain has not continued & she currently rates her pain 0/10. Denies diarrhea, constipation, dysuria, vaginal bleeding, or LOF. Reports good fetal movement.   OB History    Gravida  2   Para  0   Term  0   Preterm  0   AB  1   Living  0     SAB  1   IAB  0   Ectopic  0   Multiple  0   Live Births  0           Past Medical History:  Diagnosis Date  . Anxiety   . Asthma   . Bipolar 1 disorder (HCC)   . Chlamydia   . Collapsed lung 06/11/2020  . Depression   . Dizziness   . Gonorrhea   . PTSD (post-traumatic stress disorder)   . Rib fracture   . Syncope     Past Surgical History:  Procedure Laterality Date  . chest tube  06/11/2020    Family History  Problem Relation Age of Onset  . Asthma Mother   . Hypertension Father     Social History   Tobacco Use  . Smoking status: Never Smoker  . Smokeless tobacco: Never Used  Vaping Use  . Vaping Use: Never used  Substance Use Topics  . Alcohol use: Not Currently  . Drug use: Not Currently    Types: Marijuana, Cocaine    Comment: 7 years    Allergies: No Known Allergies  Medications Prior to Admission  Medication Sig Dispense Refill Last Dose  . acetaminophen (TYLENOL) 325 MG tablet Take 2 tablets (650 mg total) by mouth every 6 (six) hours as needed.     . Prenatal Vit-Fe Fumarate-FA (MULTIVITAMIN-PRENATAL) 27-0.8 MG TABS tablet Take 1 tablet by mouth daily at 12 noon. 30 tablet 11     Review of Systems   Constitutional: Negative.   Gastrointestinal: Positive for abdominal pain, nausea and vomiting. Negative for constipation and diarrhea.  Genitourinary: Negative.    Physical Exam   Blood pressure 115/69, pulse 89, temperature 98.3 F (36.8 C), temperature source Oral, resp. rate 17, height 5\' 7"  (1.702 m), weight 73.9 kg, last menstrual period 07/27/2020, SpO2 100 %.  Physical Exam Vitals and nursing note reviewed. Exam conducted with a chaperone present.  Constitutional:      General: She is not in acute distress.    Appearance: Normal appearance. She is normal weight.  HENT:     Head: Normocephalic and atraumatic.  Eyes:     General: No scleral icterus. Pulmonary:     Effort: Pulmonary effort is normal. No respiratory distress.  Abdominal:     Palpations: Abdomen is soft.     Tenderness: There is no abdominal tenderness. There is no right CVA tenderness or left CVA tenderness.     Comments: Gravid uterus  Genitourinary:    Comments: Dilation: Closed Effacement (%): Thick Cervical Position: Posterior Station: -3 Exam by:: [email or web address] NP  Skin:  General: Skin is warm and dry.  Neurological:     Mental Status: She is alert.  Psychiatric:        Mood and Affect: Mood normal.        Behavior: Behavior normal.    NST:  Baseline: 155 bpm, Variability: Good {> 6 bpm), Accelerations: Non-reactive but appropriate for gestational age and Decelerations: Absent   MAU Course  Procedures Results for orders placed or performed during the hospital encounter of 01/30/21 (from the past 24 hour(s))  Urinalysis, Routine w reflex microscopic Urine, Clean Catch     Status: None   Collection Time: 01/30/21  3:44 AM  Result Value Ref Range   Color, Urine YELLOW YELLOW   APPearance CLEAR CLEAR   Specific Gravity, Urine 1.012 1.005 - 1.030   pH 7.0 5.0 - 8.0   Glucose, UA NEGATIVE NEGATIVE mg/dL   Hgb urine dipstick NEGATIVE NEGATIVE   Bilirubin Urine NEGATIVE NEGATIVE   Ketones,  ur NEGATIVE NEGATIVE mg/dL   Protein, ur NEGATIVE NEGATIVE mg/dL   Nitrite NEGATIVE NEGATIVE   Leukocytes,Ua NEGATIVE NEGATIVE    MDM Patient presented via EMS for abdominal cramping. She is asymptomatic in MAU, cervix is closed, and flat TOCO. Reports a UTI earlier in this pregnancy but didn't complete her antibiotics. Denies any urinary symptoms. She is afebrile & has no CVA tenderness. U/a today is negative.   Assessment and Plan   1. Abdominal cramping affecting pregnancy   2. [redacted] weeks gestation of pregnancy    -reviewed PTL precautions & reasons to return to MAU -keep f/u with [facility]  Judeth Horn 01/30/2021, 4:03 AM

## 2021-01-30 NOTE — MAU Note (Signed)
..  Melissa [name] is a 25 y.o. at [redacted]w[redacted]d here in MAU via EMS reporting: around 2:30am she felt abdominal pain describes it as tightening and realising that lasted for about 1 hour. Denies vaginal bleeding or leaking of fluid. +FM Pain score: 0/10 currently

## 2021-03-01 ENCOUNTER — Emergency Department: Admit: 2021-03-01 | Payer: PRIVATE HEALTH INSURANCE | Primary: Internal Medicine

## 2021-03-01 ENCOUNTER — Inpatient Hospital Stay
Admit: 2021-03-01 | Discharge: 2021-03-01 | Disposition: A | Discharge status: Home / Self Care | Payer: PRIVATE HEALTH INSURANCE | Attending: Emergency Medicine

## 2021-03-01 DIAGNOSIS — S50312A Abrasion of left elbow, initial encounter: Secondary | ICD-10-CM

## 2021-03-01 MED ORDER — NAPROXEN 500 MG TAB
500 mg | ORAL_TABLET | Freq: Two times a day (BID) | ORAL | 0 refills | Status: AC | PRN
Start: 2021-03-01 — End: ?

## 2021-03-01 MED ORDER — METHOCARBAMOL 500 MG TAB
500 mg | ORAL_TABLET | Freq: Every evening | ORAL | 0 refills | Status: AC | PRN
Start: 2021-03-01 — End: ?

## 2021-03-01 MED ORDER — IBUPROFEN 600 MG TAB
600 mg | ORAL | Status: AC
Start: 2021-03-01 — End: 2021-03-01
  Administered 2021-03-01: 17:00:00 via ORAL

## 2021-03-01 MED FILL — IBUPROFEN 600 MG TAB: 600 mg | ORAL | Qty: 1
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
## 2021-03-01 NOTE — ED Notes (Signed)
Patient given discharge instructions. No questions or concerns at this time. Patient in no acute distress. Patient ambulatory out of unit at discharge.
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
## 2021-04-10 ENCOUNTER — Ambulatory Visit: Payer: Medicaid Other | Attending: Family

## 2021-04-10 ENCOUNTER — Ambulatory Visit: Payer: Medicaid Other | Admitting: *Deleted

## 2021-04-10 ENCOUNTER — Other Ambulatory Visit: Payer: Self-pay | Admitting: Family

## 2021-04-10 ENCOUNTER — Other Ambulatory Visit: Payer: Self-pay | Admitting: *Deleted

## 2021-04-10 ENCOUNTER — Ambulatory Visit (HOSPITAL_BASED_OUTPATIENT_CLINIC_OR_DEPARTMENT_OTHER): Payer: Medicaid Other | Admitting: Maternal & Fetal Medicine

## 2021-04-10 ENCOUNTER — Other Ambulatory Visit: Payer: Self-pay

## 2021-04-10 VITALS — BP 115/82 | HR 105

## 2021-04-10 DIAGNOSIS — O365931 Maternal care for other known or suspected poor fetal growth, third trimester, fetus 1: Secondary | ICD-10-CM

## 2021-04-10 DIAGNOSIS — O26849 Uterine size-date discrepancy, unspecified trimester: Secondary | ICD-10-CM

## 2021-04-10 DIAGNOSIS — O26843 Uterine size-date discrepancy, third trimester: Secondary | ICD-10-CM | POA: Insufficient documentation

## 2021-04-10 DIAGNOSIS — Z3A36 36 weeks gestation of pregnancy: Secondary | ICD-10-CM

## 2021-04-10 DIAGNOSIS — O365930 Maternal care for other known or suspected poor fetal growth, third trimester, not applicable or unspecified: Secondary | ICD-10-CM

## 2021-04-10 DIAGNOSIS — Z364 Encounter for antenatal screening for fetal growth retardation: Secondary | ICD-10-CM

## 2021-04-10 NOTE — Progress Notes (Signed)
MFM Brief Consult  Ms. Battey is a 25 yo G2P0 at [redacted]w[redacted]d with a single intrauterine pregnancy here for a follow up growth due to late prenatal care. Normal anatomy with measurements less than dates due to FGR EFW 7th%.   There is good fetal movement and amniotic fluid volume The UA Dopplers are normal without evidence of AEDF or REDF Biophysical profile 8/8  I discussed today's visit with a diagnosis of IUGR. I explained that the etiology includes placental insufficiency, chronic disease, infection, aneuploidy and other genetic syndromes. She has a low risk NIPS. She has no additional risk factors for chronic disease. At this time I explained the diagnosis, evaluation and management to include on going fetal growth and weekly antenatal testing to include UA Dopplers. If the EFW < 3rd% or abnormal testing, I recommend delivery at 37 weeks otherwise if all is normal consider delivery at 39 weeks.   Recommendations Continue weekly testing. Consider delivery at 39 weeks. You may consider a growth at 3 weeks if desired  I spent 30 minutes with > 50% in face to face consultation.  Novella Olive, MD.

## 2021-04-17 ENCOUNTER — Encounter: Payer: Self-pay | Admitting: *Deleted

## 2021-04-17 ENCOUNTER — Ambulatory Visit (HOSPITAL_BASED_OUTPATIENT_CLINIC_OR_DEPARTMENT_OTHER): Payer: Medicaid Other

## 2021-04-17 ENCOUNTER — Ambulatory Visit: Payer: Medicaid Other | Attending: Maternal & Fetal Medicine | Admitting: *Deleted

## 2021-04-17 ENCOUNTER — Other Ambulatory Visit (HOSPITAL_COMMUNITY): Payer: Self-pay | Admitting: Advanced Practice Midwife

## 2021-04-17 ENCOUNTER — Other Ambulatory Visit: Payer: Self-pay

## 2021-04-17 VITALS — BP 113/75 | HR 83

## 2021-04-17 DIAGNOSIS — Z3A37 37 weeks gestation of pregnancy: Secondary | ICD-10-CM | POA: Insufficient documentation

## 2021-04-17 DIAGNOSIS — Z362 Encounter for other antenatal screening follow-up: Secondary | ICD-10-CM

## 2021-04-17 DIAGNOSIS — O26843 Uterine size-date discrepancy, third trimester: Secondary | ICD-10-CM | POA: Diagnosis not present

## 2021-04-17 DIAGNOSIS — O365930 Maternal care for other known or suspected poor fetal growth, third trimester, not applicable or unspecified: Secondary | ICD-10-CM | POA: Diagnosis not present

## 2021-04-17 DIAGNOSIS — O365931 Maternal care for other known or suspected poor fetal growth, third trimester, fetus 1: Secondary | ICD-10-CM | POA: Diagnosis not present

## 2021-04-19 ENCOUNTER — Telehealth (HOSPITAL_COMMUNITY): Payer: Self-pay | Admitting: *Deleted

## 2021-04-19 ENCOUNTER — Ambulatory Visit: Payer: Medicaid Other

## 2021-04-19 ENCOUNTER — Other Ambulatory Visit: Payer: Self-pay

## 2021-04-19 DIAGNOSIS — F209 Schizophrenia, unspecified: Secondary | ICD-10-CM | POA: Insufficient documentation

## 2021-04-19 DIAGNOSIS — F419 Anxiety disorder, unspecified: Secondary | ICD-10-CM | POA: Insufficient documentation

## 2021-04-19 NOTE — Telephone Encounter (Signed)
Preadmission screen  

## 2021-04-21 NOTE — Progress Notes (Addendum)
MFM Note  The patient had a fetal echocardiogram performed with [facility] pediatric cardiology.    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    Dr. Elizebeth Brooking believes that she can deliver at [facility].   The baby will need an echocardiogram after birth to confirm the anatomy.  He does not think that anything needs to be done in the newborn period.    He will see the baby after discharge and suspect that the baby will most likely need surgical repair sometime between 11 to 74 months of age.    Due to IUGR, she is already scheduled for induction.

## 2021-04-22 ENCOUNTER — Inpatient Hospital Stay (HOSPITAL_COMMUNITY): Payer: Medicaid Other

## 2021-04-23 ENCOUNTER — Inpatient Hospital Stay (HOSPITAL_COMMUNITY)
Admission: AD | Admit: 2021-04-23 | Discharge: 2021-04-26 | DRG: 807 | Disposition: A | Discharge status: Home / Self Care | Payer: Medicaid Other | Attending: Obstetrics and Gynecology | Admitting: Obstetrics and Gynecology

## 2021-04-23 ENCOUNTER — Other Ambulatory Visit: Payer: Self-pay

## 2021-04-23 ENCOUNTER — Encounter (HOSPITAL_COMMUNITY): Payer: Self-pay | Admitting: Family Medicine

## 2021-04-23 DIAGNOSIS — O99824 Streptococcus B carrier state complicating childbirth: Secondary | ICD-10-CM | POA: Diagnosis present

## 2021-04-23 DIAGNOSIS — O365990 Maternal care for other known or suspected poor fetal growth, unspecified trimester, not applicable or unspecified: Secondary | ICD-10-CM

## 2021-04-23 DIAGNOSIS — Z20822 Contact with and (suspected) exposure to covid-19: Secondary | ICD-10-CM | POA: Diagnosis present

## 2021-04-23 DIAGNOSIS — Z3A38 38 weeks gestation of pregnancy: Secondary | ICD-10-CM | POA: Diagnosis not present

## 2021-04-23 DIAGNOSIS — O365930 Maternal care for other known or suspected poor fetal growth, third trimester, not applicable or unspecified: Principal | ICD-10-CM | POA: Diagnosis present

## 2021-04-23 DIAGNOSIS — O358XX0 Maternal care for other (suspected) fetal abnormality and damage, not applicable or unspecified: Secondary | ICD-10-CM | POA: Diagnosis present

## 2021-04-23 DIAGNOSIS — O99820 Streptococcus B carrier state complicating pregnancy: Secondary | ICD-10-CM

## 2021-04-23 DIAGNOSIS — O368331 Maternal care for abnormalities of the fetal heart rate or rhythm, third trimester, fetus 1: Secondary | ICD-10-CM | POA: Diagnosis not present

## 2021-04-23 HISTORY — DX: Maternal care for other known or suspected poor fetal growth, unspecified trimester, not applicable or unspecified: O36.5990

## 2021-04-23 LAB — CBC
HCT: 29.7 % — ABNORMAL LOW (ref 36.0–46.0)
Hemoglobin: 10 g/dL — ABNORMAL LOW (ref 12.0–15.0)
MCH: 29.6 pg (ref 26.0–34.0)
MCHC: 33.7 g/dL (ref 30.0–36.0)
MCV: 87.9 fL (ref 80.0–100.0)
Platelets: 181 10*3/uL (ref 150–400)
RBC: 3.38 MIL/uL — ABNORMAL LOW (ref 3.87–5.11)
RDW: 14.3 % (ref 11.5–15.5)
WBC: 6.7 10*3/uL (ref 4.0–10.5)
nRBC: 0 % (ref 0.0–0.2)

## 2021-04-23 LAB — TYPE AND SCREEN
ABO/RH(D): B POS
Antibody Screen: NEGATIVE

## 2021-04-23 MED ORDER — ONDANSETRON HCL 4 MG/2ML IJ SOLN: 4.0000 mg | Freq: Four times a day (QID) | INTRAMUSCULAR | Status: DC | PRN

## 2021-04-23 MED ORDER — FENTANYL CITRATE (PF) 100 MCG/2ML IJ SOLN
50.0000 ug | INTRAMUSCULAR | Status: DC | PRN
  Administered 2021-04-24: 100 ug via INTRAVENOUS
  Filled 2021-04-23: qty 2

## 2021-04-23 MED ORDER — LIDOCAINE HCL (PF) 1 % IJ SOLN: 30.0000 mL | INTRAMUSCULAR | Status: DC | PRN

## 2021-04-23 MED ORDER — OXYCODONE-ACETAMINOPHEN 5-325 MG PO TABS: 2.0000 | ORAL_TABLET | ORAL | Status: DC | PRN

## 2021-04-23 MED ORDER — TERBUTALINE SULFATE 1 MG/ML IJ SOLN: 0.2500 mg | Freq: Once | INTRAMUSCULAR | Status: DC | PRN

## 2021-04-23 MED ORDER — OXYCODONE-ACETAMINOPHEN 5-325 MG PO TABS: 1.0000 | ORAL_TABLET | ORAL | Status: DC | PRN

## 2021-04-23 MED ORDER — SODIUM CHLORIDE 0.9 % IV SOLN
5.0000 10*6.[IU] | Freq: Once | INTRAVENOUS | Status: AC
  Administered 2021-04-23: 5 10*6.[IU] via INTRAVENOUS
  Filled 2021-04-23: qty 5

## 2021-04-23 MED ORDER — SOD CITRATE-CITRIC ACID 500-334 MG/5ML PO SOLN: 30.0000 mL | ORAL | Status: DC | PRN

## 2021-04-23 MED ORDER — MISOPROSTOL 25 MCG QUARTER TABLET: 25.0000 ug | ORAL_TABLET | ORAL | Status: DC | PRN

## 2021-04-23 MED ORDER — PENICILLIN G POT IN DEXTROSE 60000 UNIT/ML IV SOLN
3.0000 10*6.[IU] | INTRAVENOUS | Status: DC
Start: 2021-04-23 — End: 2021-04-25
  Administered 2021-04-23 – 2021-04-25 (×7): 3 10*6.[IU] via INTRAVENOUS
  Filled 2021-04-23 (×8): qty 50

## 2021-04-23 MED ORDER — HYDROXYZINE HCL 50 MG PO TABS
50.0000 mg | ORAL_TABLET | Freq: Four times a day (QID) | ORAL | Status: DC | PRN
Start: 2021-04-23 — End: 2021-04-25

## 2021-04-23 MED ORDER — LACTATED RINGERS IV SOLN: INTRAVENOUS | Status: DC

## 2021-04-23 MED ORDER — ACETAMINOPHEN 325 MG PO TABS
650.0000 mg | ORAL_TABLET | ORAL | Status: DC | PRN
Start: 2021-04-23 — End: 2021-04-25
  Administered 2021-04-24: 650 mg via ORAL
  Filled 2021-04-23 (×2): qty 2

## 2021-04-23 MED ORDER — MISOPROSTOL 50MCG HALF TABLET
50.0000 ug | ORAL_TABLET | ORAL | Status: DC | PRN
  Administered 2021-04-23: 50 ug via BUCCAL
  Filled 2021-04-23: qty 1

## 2021-04-23 MED ORDER — OXYTOCIN-SODIUM CHLORIDE 30-0.9 UT/500ML-% IV SOLN
2.5000 [IU]/h | INTRAVENOUS | Status: DC
  Administered 2021-04-25 (×2): 2.5 [IU]/h via INTRAVENOUS
  Filled 2021-04-23: qty 500

## 2021-04-23 MED ORDER — ZOLPIDEM TARTRATE 5 MG PO TABS: 5.0000 mg | ORAL_TABLET | Freq: Every evening | ORAL | Status: DC | PRN

## 2021-04-23 MED ORDER — OXYTOCIN BOLUS FROM INFUSION: 333.0000 mL | Freq: Once | INTRAVENOUS | Status: DC

## 2021-04-23 MED ORDER — FLEET ENEMA 7-19 GM/118ML RE ENEM: 1.0000 | ENEMA | RECTAL | Status: DC | PRN

## 2021-04-23 MED ORDER — LACTATED RINGERS IV SOLN: 500.0000 mL | INTRAVENOUS | Status: DC | PRN

## 2021-04-23 NOTE — Progress Notes (Signed)
Patient returned from using the bathroom and refusing to be placed back on EFM.  Pt allowed RN to rest [place] and toco on abdomen while discussing pain relief options.  Informed pt that baby would need to be monitored if pain medications were given.  Pt requesting to intermittently monitor and wants to wait on receiving any pain medications at this time.  Notified Thressa Sheller, CNM.  Plan to intermittently EFM monitor Q4min as pt refusing continuous monitoring.

## 2021-04-23 NOTE — H&P (Addendum)
OBSTETRIC ADMISSION HISTORY AND PHYSICAL  Melissa [name] is a 25 y.o. female G2P0010 with IUP at [redacted]w[redacted]d by LMP c/w early [place] presenting for IOL for IUGR and tetralogy of fallot. She reports +FMs, No LOF, no VB, no blurry vision, headaches or peripheral edema, and RUQ pain.  She plans on breast and bottle feeding. She requests IUD for birth control. 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 -GBS bacteriuria: will tx with PCN -Schizoaffective d/o -Chlamydia: + on 08/25/20, tx with 1 g Azithro, neg TOC January 2022 -Marijuana use: + in first trimester, negative after patient found out about pregnancy  Past Medical History: Past Medical History:  Diagnosis Date   Anxiety    Asthma    Bipolar 1 disorder (HCC)    Chlamydia    Collapsed lung 06/11/2020   Depression    Dizziness    Gonorrhea    PTSD (post-traumatic stress disorder)    Rib fracture    Syncope     Past Surgical History: Past Surgical History:  Procedure Laterality Date   chest tube  06/11/2020    Obstetrical History: OB History     Gravida  2   Para  0   Term  0   Preterm  0   AB  1   Living  0      SAB  1   IAB  0   Ectopic  0   Multiple  0   Live Births  0           Social History Social History   Socioeconomic History   Marital status: Single    Spouse name: Not on file   Number  of children: 0   Years of education: Not on file   Highest education level: Some college, no degree  Occupational History   Not on file  Tobacco Use   Smoking status: Never   Smokeless tobacco: Never  Vaping Use   Vaping Use: Never used  Substance and Sexual Activity   Alcohol use: Not Currently   Drug use: Not Currently    Types: Marijuana, Cocaine    Comment: 7 years   Sexual activity: Yes  Other Topics Concern   Not on file  Social History Narrative   Not on file   Social Determinants of Health   Financial Resource Strain: Not on file  Food Insecurity: Not on file  Transportation Needs: Not on file  Physical Activity: Not on file  Stress: Not on file  Social Connections: Not on file    Family History: Family History  Problem Relation Age of Onset   Asthma Mother    Hypertension Father     Allergies: No Known Allergies  Medications Prior to Admission  Medication Sig Dispense Refill Last Dose   acetaminophen (TYLENOL) 325 MG tablet Take 2 tablets (650 mg total) by mouth every 6 (six) hours as needed.  Prenatal Vit-Fe Fumarate-FA (MULTIVITAMIN-PRENATAL) 27-0.8 MG TABS tablet Take 1 tablet by mouth daily at 12 noon. 30 tablet 11 Unknown     Review of Systems   All systems reviewed and negative except as stated in HPI  Last menstrual period 07/27/2020. General appearance: alert, cooperative, appears stated age, and no distress Lungs: clear to auscultation bilaterally Heart: regular rate and rhythm Abdomen: soft, non-tender; bowel sounds normal Pelvic: normal external female genitalia Extremities: Homans sign is negative, no sign of DVT DTR's 2+ Presentation: cephalic Fetal monitoringBaseline: 150 bpm, Variability: Good {> 6 bpm), Accelerations: Reactive, and Decelerations: Absent Uterine activity: irregular    Prenatal labs: ABO, Rh: B/Positive/-- (01/31 0000) Antibody: Negative (01/31 0000) Rubella: Immune (01/31 0000) RPR: Nonreactive (01/31  0000)  HBsAg: Negative (01/31 0000)  HIV: Non-reactive (01/31 0000)  GBS: Positive/-- (01/31 0000)  1 hr Glucola 82 Genetic screening  Quad screen WNL, LR NIPS Anatomy US Tetraology of Fallot, IUGR  Prenatal Transfer Tool  Maternal Diabetes: No Genetic Screening: Normal Maternal Ultrasounds/Referrals: Fetal Heart Anomalies Fetal Ultrasounds or other Referrals:  Fetal echo Tet of Fallot Maternal Substance Abuse:  Yes:  Type: Marijuana Significant Maternal Medications:  None Significant Maternal Lab Results: Group B Strep positive  No results found for this or any previous visit (from the past 24 hour(s)).  Patient Active Problem List   Diagnosis Date Noted   Fetal growth retardation, antepartum 04/23/2021   Blow out fracture of orbit (HCC) 01/30/2021   Pneumothorax, traumatic 06/12/2020   Schizoaffective disorder, depressive type (HCC) 07/19/2016    Assessment/Plan:  Melissa [name] is a 25 y.o. G2P0010 at [redacted]w[redacted]d here for IOL d/t IUGR and tetralogy of fallot.  #Labor: Will start induction with cervical ripening phase: cytotec + FB. #Pain: Unsure, possibly IV pain meds to eventually epidural #FWB: Cat I #ID:  GBS +> PCN #MOF: breast and bottle #MOC: outpatient IUD #Circ:  N/a  #IUGR and Tetralogy of fallot: Patient's father and brother have h/o VSD. At initial anatomy US, MFM recommended fetal echo for good measure, but patient never had this done. IUGR noticed after patient measured small for dates in mid August. A subsequent [place] confirmed 7th percentile. A BPP/and doppler then revealed probable tetralogy of fallot on 04/17/21 and patient had a confirmatory fetal echo on 04/19/21 with Dr. Elizebeth Brooking. NICU notified that patient is being induced, will call NICU team for delivery. Fetus will need echo after delivery.  #Schizoaffective d/o: Patient has been stable, not on meds. Will have SW evaluate pp.  Shirlean Mylar, MD  04/23/2021, 1:18 PM   Attestation of Supervision of  Student:  I confirm that I have verified the information documented in the  resident's  note and that I have also personally reperformed the history, physical exam and all medical decision making activities.  I have verified that all services and findings are accurately documented in this student's note; and I agree with management and plan as outlined in the documentation. I have also made any necessary editorial changes.  Attempted to place cooks catheter. Unable at this time. Cervix only 1cm dilated and cooks would coil out of the cervix. Will give dose of cytotec now and re-attempt with a speculum in a few hours.   Thressa Sheller DNP, CNM  04/23/21  3:36 PM

## 2021-04-23 NOTE — Progress Notes (Signed)
Labor Progress Note Melissa [name] is a 25 y.o. G2P0010 at [redacted]w[redacted]d presented for IOL due to FGR.   S: Doing well, FB still in place. Resting.   O:  BP (!) 106/57 (BP Location: Right Arm)   Pulse 60   Temp 98.3 F (36.8 C) (Oral)   Resp 16   Ht 5\' 7"  (1.702 m)   Wt 79.3 kg   LMP 07/27/2020   SpO2 98%   BMI 27.38 kg/m  EFM: 150/mod/15x15/none   CVE: Dilation: 1 Effacement (%): 50 Cervical Position: Posterior Presentation: Vertex Exam by:: [email or web address], CNM   A&P: 26 y.o. G2P0010 [redacted]w[redacted]d. #Labor: FB still in place. S/p cytotec x1. Plan for pit after FB falls out.   #Pain: PRN  #FWB: Cat 1  #GBS positive (PCN)    #FGR and Tetralogy of Fallot: 7th percentile with normal dopplers on 8/23 U/S. NICU at delivery.    9/23, DO 9:29 PM

## 2021-04-23 NOTE — Progress Notes (Signed)
Melissa [name] is a 25 y.o. G2P0010 at [redacted]w[redacted]d by admitted for induction of labor due to Poor fetal growth.  Subjective: Patient doing well. No questions. Feeling some cramping and discomfort.   Objective: BP 122/82   Pulse 76   Temp 98.3 F (36.8 C) (Oral)   Resp 16   Ht 5\' 7"  (1.702 m)   Wt 79.3 kg   LMP 07/27/2020   SpO2 98%   BMI 27.38 kg/m  No intake/output data recorded. No intake/output data recorded.  FHT:  FHR: 150 bpm, variability: moderate,  accelerations:  Present,  decelerations:  Absent UC:   frequent UI SVE:   Dilation: 1 Effacement (%): 50 Exam by:: [email or web address], CNM  Foley bulb placement: Informed consent obtained. Patient placed in lithotomy position and cervix visualized with speculum. Cooks cath gently guided through the cervix. 60cc of sterile water placed in the uterine balloon. Patient tolerated well.    Labs: Lab Results  Component Value Date   WBC 6.7 04/23/2021   HGB 10.0 (L) 04/23/2021   HCT 29.7 (L) 04/23/2021   MCV 87.9 04/23/2021   PLT 181 04/23/2021    Assessment / Plan: Cervical ripening phase of labor   Labor:  FB placed. Too much uterine activity for another dose of cytotec. Will monitor and consider another cytotec or pitocin later as needed.  Preeclampsia:   NA Fetal Wellbeing:  Category I Pain Control:  Labor support without medications I/D:  n/a Anticipated MOD:  NSVD   04/25/2021 DNP, CNM  04/23/21  6:10 PM

## 2021-04-24 ENCOUNTER — Inpatient Hospital Stay (HOSPITAL_COMMUNITY): Payer: Medicaid Other | Admitting: Anesthesiology

## 2021-04-24 ENCOUNTER — Ambulatory Visit: Payer: Medicaid Other

## 2021-04-24 LAB — RPR: RPR Ser Ql: NONREACTIVE

## 2021-04-24 LAB — SARS CORONAVIRUS 2 (TAT 6-24 HRS): SARS Coronavirus 2: NEGATIVE

## 2021-04-24 MED ORDER — PHENYLEPHRINE 40 MCG/ML (10ML) SYRINGE FOR IV PUSH (FOR BLOOD PRESSURE SUPPORT): 80.0000 ug | PREFILLED_SYRINGE | INTRAVENOUS | Status: DC | PRN

## 2021-04-24 MED ORDER — EPHEDRINE 5 MG/ML INJ: 10.0000 mg | INTRAVENOUS | Status: DC | PRN

## 2021-04-24 MED ORDER — FENTANYL-BUPIVACAINE-NACL 0.5-0.125-0.9 MG/250ML-% EP SOLN
12.0000 mL/h | EPIDURAL | Status: DC | PRN
  Administered 2021-04-25: 12 mL/h via EPIDURAL
  Filled 2021-04-24: qty 250

## 2021-04-24 MED ORDER — TERBUTALINE SULFATE 1 MG/ML IJ SOLN: 0.2500 mg | Freq: Once | INTRAMUSCULAR | Status: DC | PRN

## 2021-04-24 MED ORDER — DIPHENHYDRAMINE HCL 50 MG/ML IJ SOLN
12.5000 mg | INTRAMUSCULAR | Status: DC | PRN
Start: 2021-04-24 — End: 2021-04-25

## 2021-04-24 MED ORDER — LACTATED RINGERS IV SOLN: 500.0000 mL | Freq: Once | INTRAVENOUS | Status: DC

## 2021-04-24 MED ORDER — LACTATED RINGERS IV SOLN
500.0000 mL | Freq: Once | INTRAVENOUS | Status: AC
  Administered 2021-04-25: 500 mL via INTRAVENOUS

## 2021-04-24 MED ORDER — OXYTOCIN-SODIUM CHLORIDE 30-0.9 UT/500ML-% IV SOLN
1.0000 m[IU]/min | INTRAVENOUS | Status: DC
  Administered 2021-04-24: 2 m[IU]/min via INTRAVENOUS
  Filled 2021-04-24: qty 500

## 2021-04-24 MED ORDER — PHENYLEPHRINE 40 MCG/ML (10ML) SYRINGE FOR IV PUSH (FOR BLOOD PRESSURE SUPPORT)
80.0000 ug | PREFILLED_SYRINGE | INTRAVENOUS | Status: DC | PRN
  Filled 2021-04-24: qty 10

## 2021-04-24 NOTE — Progress Notes (Signed)
Melissa [name] is a 25 y.o. G2P0010 at [redacted]w[redacted]d admitted for induction of labor due to IUGR and tetralogy of fallot.  Subjective: Patient feeling more uncomfortable with contractions  Objective: BP 120/78   Pulse 76   Temp 98.6 F (37 C) (Oral)   Resp 18   Ht 5\' 7"  (1.702 m)   Wt 79.3 kg   LMP 07/27/2020   SpO2 98%   BMI 27.38 kg/m  No intake/output data recorded. No intake/output data recorded.  FHT:  FHR: 145 bpm, variability: moderate,  accelerations:  Present,  decelerations:  Absent UC:   regular, every 5 minutes SVE:   Dilation: 5.5 Effacement (%): 90 Station: -1 Exam by:: Dr [email or web address]  Labs: Lab Results  Component Value Date   WBC 6.7 04/23/2021   HGB 10.0 (L) 04/23/2021   HCT 29.7 (L) 04/23/2021   MCV 87.9 04/23/2021   PLT 181 04/23/2021    Assessment / Plan: Induction of labor due to IUGR and tetralogy of fallot,  progressing well on pitocin  Labor:  No significant change since last check. Will continue to titrate pitocin and eventually AROM. Preeclampsia:   n/a. BP stable. Fetal Wellbeing:  Category I Pain Control:  IV pain meds, Nitrous Oxide, and eventually epidural I/D:   GBS pos> adequate PCN ppx Anticipated MOD:  NSVD  04/25/2021 04/24/2021, 1:10 PM

## 2021-04-24 NOTE — Progress Notes (Signed)
Called to beside by RN. Patient requesting Pitocin be stopped because her contractions are too strong. Discussed at length importance of strong, regular contractions in order to allow for progression of labor and ultimately delivery. Patient stated that she felt her contractions were strong enough and she did not want to the Pitocin anymore. She also requested that her IV be taken out. Discussed that she needed to keep her IV in place for her safety in the event that emergency medications/intervention are needed. Pitocin stopped for now. Recommended that patient take some time to re-evaluate her options for pain control in order to allow her to progress in her labor at this point. Patient agreed to think about this and will make a decision. Will return to bedside in 15-20 min to discuss again with patient.  Evalina Field, MD

## 2021-04-24 NOTE — Progress Notes (Signed)
MD at bedside talking extensively with patient about the risks of wanting to leave the hospital. Explained that her baby is severe IUGR and has a greater risk of being a stillborn. Patient stated "if I go home and I have a stillborn then that is just what was supposed to happen". Patient was offered several options to improve labor experience and educated thoroughly of all of her options. MD to follow up with patient.

## 2021-04-24 NOTE — Progress Notes (Signed)
Labor Progress Note Melissa [name] is a 25 y.o. G2P0010 at [redacted]w[redacted]d presented for IOL due to IUGR (7th %ile) and Tetralogy of Fallot S: Starting to feel contractions more strongly with most recent increase in Pitocin rate (now at 8). No other complaints at this time.   O:  BP (!) 102/49   Pulse 65   Temp 98.3 F (36.8 C) (Oral)   Resp 16   Ht 5\' 7"  (1.702 m)   Wt 79.3 kg   LMP 07/27/2020   SpO2 98%   BMI 27.38 kg/m  EFM: 145/moderate/accelerations present  CVE: Dilation: 5 Effacement (%): 70 Cervical Position: Posterior Station: -2 Presentation: Vertex Exam by:: Dr. [email or web address]   A&P: 25 y.o. G2P0010 [redacted]w[redacted]d IOL for IUGR #Labor: Progressing well. Beginning to have stronger contractions, has room to go up with pitocin dose #Pain: PRN, planning epidural but not yet #FWB: Cat I  #GBS positive > PCN  #IUGR and Tetralogy of Fallot: NICU at delivery   [redacted]w[redacted]d, MD 6:44 AM

## 2021-04-24 NOTE — Significant Event (Signed)
Notified by fellow that patient was both refusing interventions for induction of labor and also requesting to be discharged home.  I went to bedside and had a long conversation with the patient that was witnessed by her labor RN Wynelle Cleveland. I first started by clarifying her understanding of why she was here and why we were inducing her labor. Patient aware that her baby is IUGR (2.4% on last growth [place]) and that this is the indication for induction. I then pressed further but patient was unaware of the consequences of IUGR. I then explained that there is in an increase in the rate of stillbirth for fetuses affected by IUGR, and additionally the rate of this complication is significantly reduced by delivering prior to the EDD.   We then discussed her main concerns around labor. Primarily patient is concerned about the pain she is feeling and that it is not natural. She feels that the pitocin is causing undue pain and unnatural sensations, and she would prefer to wait for natural labor as she feels that it would not be as painful. I asked if she knew what her options for pain control are and she was able to immediately tell me her options (IV pain meds, epidural, and nitrous). I asked how she felt about these. She did not like how nitrous makes her feel, she did not feel that IV pain meds were very effective, and she is scared of getting an epidural.  We explored her concern around epidurals in detail. She reported several concerns: first that it would make the birth experience less valid; second that it would cause long term back pain; three that there could be significant complications with possible injury to her spine. I addressed these concerns in detail, specifically that all birth experiences are valid and meaningful whether they are epiduralized or not; that decades of research has shown no association with back pain; and that by the very nature and location of how an epidural is placed injury to the  spinal cord is incredibly unlikely if not almost impossible.   I then suggested several options to address patient's concern. I reviewed that it seemed like her primary concern was pain control, and recommended an epidural. As an alternative, we could perform AROM and allow her labor to take over naturally and defer pain control. In the ideal setting I recommended doing both of these things together.   Patient flatly declined any further interventions despite this long conversation and asked to be discharged. I asked if she understood the consequences of this action and without any prompting or additional questioning she stated "I understand my baby might die if I leave but I have to put me first." Patient again made multiple statements to the same effect and clearly demonstrated capacity during our conversation.   Her mother was at bedside during all these conversations and was appropriately concerned about her daughter's desire to leave AMA. She reported she has been trying to convince her daughter to stay but has been unsuccessful.  I asked patient if she was willing to wait a bit until I could consult with the hospital ethics team on this matter and she was amenable to this. I spoke with the ethics team who's input is very much appreciated. See there excellent note--in summary patient does demonstrate capacity and should be allowed to leave if she forces the issue.  In the mean time I discussed the case further with Dr. Debroah Loop and Dr. Vergie Living. We determined we would offer  her overnight observation on the antepartum unit. I returned to speak with the patient and offered her overnight observation with continuous fetal monitoring and she accepted this proposal. During our conversation she had a painful contraction and I said to her this seemed like the best course of action for the time being. I expressed my relief to her and also discussed (this had not come up in our last conversation) that given she  appears to be in latent labor and we do not know how quickly she may progress later on, given her baby's heart condition (known Tetralogy of Fallot) we should avoid any scenario in which she might deliver out of hospital.  Transfer order place and [name] charge nurse updated on plan. Care turned over to Dr. Vergie Living at change of shift.

## 2021-04-24 NOTE — Ethics Note (Signed)
Ethics Consult Note  Initial contact w/ medical team 04/24/21, which was on patient's hospital day 1   Source of Consult: Dr. Crissie Reese Current attending physician/service: Venora Maples, MD  Reason(s) for consult and ethical question(s): Patient making decisions against medical advice that may impact infant, therefore IS MEDICAL TEAM ETHICALLY OBLIGATED TO OVERRIDE HER DECISION / DESIRE TO LEAVE AMA   Information-gathering: Discussion with source of consult Chart review 04/24/21   Narrative:  Medical facts: 25 yo G1 at 38.[redacted] weeks gestation, IUGR and Tetraolgy of Fallot make this very high risk of stillbirth. Fetal monitoring is reassuring thus far. Patient came in for induction of labor but decided against Pitocin and against epidural pain control, she wants to leave the hospital and go into labor naturally. Per Dr. Crissie Reese, she has capacity to make decisions, give informed consent/informed refusal of treatment, though she does have a history of schizoaffective disorder. She voices that it will be better for her/baby to go into labor naturally. She has been educated on risks/benefits of al available interventions for labor induction and for pain control, as well as risks to infant if born outside hospital setting / NICU.    With regard to the applicable underlying ethical principles, the standards of ethical care and relevant resources were discussed directly with the attending physician, after speaking to two other members of Ethics Committe.  Ethics committee strives to ensure that all necessary and appropriate steps are taken, such that all decisions made for this patient are ethically justifiable. Ethics offers the following recommendations:     Recommendations:  1) If patient has CAPACITY to make her own decisions, her autonomy should be respected. If she insists on refusal of medical interventions, as long as she understands the risks to her and to her infant, she is free to make  those choices, though those choices may seem irrational/unreasonable to her medical team. In the interest of shared decision-making, we encourage the medical team to: Discuss patient's values which are impacting her decision-making: does she value "natural" over "medical" interventions for any personal reason, religious reason? Does she have specific concerns about how medical interventions / standards of care will be harmful to her or conflict with her values, and what can the medical team do or offer that may be acceptable? Discuss interventions/monitoring that align with patient's wishes but still allow her to remain close to intensive medical care for her and her infant's safety: she seems to have her and her infant's best interest at heart, how can the medical team facilitate safety, even if she is not undergoing active intervention? What monitoring (admission in hospital or outpatient intensive follow-up) would be acceptable and fit with her values?  Discuss, explicitly, what to expect in a worst case scenario if she delivers outside the hospital: what will this baby look like (cyanotic, unresponsive - which can be very scary for patient and for other support persons)? What is her plan if/when she does go into labor on her own and can't get to a hospital? Would discussion with a NICU physician be helpful to determine risks/benefits?  2) Psychiatry evaluation, while not required to determine capacity, may be of use if  there is concern that her psychiatric condition(s) is/are affecting her ability to judge what is in her own best interest, and how best this may be managed  there is concern she might meet criteria for psychiatric hold as a danger to herself  Both these concerns, based on my discussion w/ the attending  physician, do not seem to be particularly relevant in this case. In any case in which a patient with psychiatric/psychological history is making decisions that seem irrational, a second  opinion about their mental state may be helpful.  3) Attending physician was advised that this committee DOES NOT OFFER LEGAL ADVICE.        Thank you for this consult. Ethics will continue to follow this case.   Dr. Crissie Reese has my personal cell phone number and has my permission to share this number at their discretion.  Secure message on Epic is also welcome but may not receive an immediate response.  Please reference AMION for on-call committee member if needed - I am on call now.    Sunnie Nielsen [facility] Ethics Committee

## 2021-04-24 NOTE — Progress Notes (Signed)
Labor Progress Note LINDELL TUSSEY is a 25 y.o. G2P0010 at [redacted]w[redacted]d presented for IOL due to FGR.  S: Doing well, FB came out. Feeling occasional contractions every few minutes or so.   O:  BP 107/62   Pulse 67   Temp 98.3 F (36.8 C) (Oral)   Resp 16   Ht 5\' 7"  (1.702 m)   Wt 79.3 kg   LMP 07/27/2020   SpO2 98%   BMI 27.38 kg/m  EFM: 145/mod/15x15/none  CVE: Dilation: 4 Effacement (%): 80 Cervical Position: Posterior Station: -2 Presentation: Vertex Exam by:: Dr. [email or web address]   A&P: 25 y.o. G2P0010 [redacted]w[redacted]d  #Labor: Progressing well now s/p FB and cytotec x1. Plan to transition to pit 2x2.  #Pain: PRN  #FWB: Cat 1  #GBS positive PCN   #FGR and Tetralogy of Fallot: 7th percentile with normal dopplers on 8/23 U/S. NICU at delivery.   9/23, DO 3:55 AM

## 2021-04-24 NOTE — Progress Notes (Signed)
Went in to patient room to adjust FHR monitors and patient had removed the monitors, taken her IV out, and had gotten dressed. Stated that she is waiting for her mother to come back into the room and the she was leaving. MD notified.

## 2021-04-24 NOTE — Progress Notes (Signed)
2105 - Pt requesting to be transferred to L&D for epidural. Dr. Vergie Living notified, ordered received for transfer.  2125 - Called L&D charge nurse for bed. No bed available at this time. Pt notified about bed situation and offered IV pain medication. RN told pt she will need SVE before IV pain medication. Pt agreed.  2152 - Pt took herself off monitor and does not want to be monitored. Explained to pt, RN will be unable to administer IV pain med without fetal monitoring.   2157 - Pt agreed to be monitored and pain med given. Pt resting at this time. Will continue to monitor.

## 2021-04-24 NOTE — Progress Notes (Signed)
OB Note  Fetal tracing stable.  Patient resting and declining SVE from me or RN. Patient currently not requesting to leave. Continue exp management.  Charge nurse asked to let peds know that patient will probably not allow up to deliver upstairs and to have all necessary equipment that they may need down here when the time comes and same goes for OB delivery supplies   Cornelia Copa MD Attending Center for [facility] (Faculty Practice) GYN Consult Phone: [phone] (M-F, [phone]) & [phone] (Off hours, weekends, holidays) Time: 2102

## 2021-04-24 NOTE — Progress Notes (Addendum)
OB Note  FNP Burt Ek contacted me and stated that someone would be by in the morning but if patient is wanting to leave that she agrees that IVC is warranted and gave me the number to the ED SW to help with this process.  I went back into the patient's room. She stated that she was feeling abdominal pain every few minutes and it was feeling stronger. She was asking to have a chair so she could shower and I said that we will find one for her but if I or a nurse could check her cervix in the meantime and she said that that was okay but after she got to see her dad and after he had to leave when visiting hours ended at 2000.  I met both her parents in the lobby and they were very appreciate of our care and I told them the plan and I hoped that with her feeling more uncomfortable and especially if her cervix is dilating more that she would voluntarily stay.  I told them that I would see them again after the visitation and that the mom was planning on being her support person overnight  Fetus remains category II due to occasional slight variables. Tocometry not tracing well b/c patient is laying on her side to rest.  Restart PCN for GBS positive if patient becomes amenable; she is s/p at least two doses with last one at 1018am today.   Durene Romans MD Attending Center for [facility] (Faculty Practice) GYN Consult Phone: [phone] (M-F, 0800-1700) & [phone] (Off hours, weekends, holidays) Time: [id number]

## 2021-04-24 NOTE — Progress Notes (Signed)
OB Note  I was called by the nurse because patient now wants to go home again. I went down to antepartum to see her and discuss with her re: this.  She states she wants to go home because she wants to take a shower. She also states she wants to go home because she's hurting. She also states she wants to go home because the FOB will be there.    I told her that we can get her a shower chair for her room but unfortunately there are no bath tubs on the unit.  I told her that the last place a pregnant patient should go is home if she's hurting, unless she wants to have a baby at home, which I told her is highly not recommended given that this baby is small and with a heart defect.  I asked her if she would let me check her cervix to see if her cervix is any different; she has been decline exams but her last one was at 1115am today and it was 5-6cm/90% effaced and -1 station; she declined my offer. I asked her what if she had the baby's head in the vagina and she was close to delivery on my check, would she stay and she said that she wouldn't.   I asked her if the FOB knows that she's in earl labor and that everyone (doctors, nurses, her mom and her her dad) are pleading with her to stay in the hospital and she said that yes the FOB knows and that he is "yelling" at her to stay in the hospital.    Fetus is currently category II due to occasional slight variable decels and she is not tracing well on the tocometry but during my 15 minute talk with her, she had at least one moderately uncomfortable contraction.  Her mother is present in the room and is pleading with her to stay. I asked the patient if she was willing to have other people from the hospital come and talk to her about the situation. She asked if they could come to her house and I told her that they could not. I asked her if she was willing to stay and talk to them. She stated that she would if they could come quickly.    I paged the psych APP  covering the ED at [phone] x 2 and received no answer. I also called the 24-7 [facility] app contact number at [phone] x 2 and received no answer. I called the 5p-8a back up md - service line and she answered and recommended I place an actual psych consult in the computer and that she feels that the patient does not have capacity and an IVC is indicated.   I called the [facility] ED APP number and she again recommended placing a consult in the computer and seeing if any tele psych services are available  Per notebook that came with device, I placed a consult to TTS and I called [phone] but no answered. I called a pager number at [phone] and left my call back but received no call back   Cornelia Copa MD Attending Center for [employer] (Faculty Practice) 04/24/2021 Time: [phone]

## 2021-04-24 NOTE — Progress Notes (Signed)
Requested MD at bedside, pt had clamped off IV at the site stating that she wanted the pitocin stopped because the contractions were to strong. RN educated about the importance of the pitocin and the normal progression of labor, including expectations of pain progressing and options for pain management. Pt increasingly agitated, MD notified.

## 2021-04-24 NOTE — Anesthesia Preprocedure Evaluation (Signed)
Anesthesia Evaluation  Patient identified by MRN, date of birth, ID band Patient awake    Reviewed: Allergy & Precautions, Patient's Chart, lab work & pertinent test results  History of Anesthesia Complications Negative for: history of anesthetic complications  Airway Mallampati: II  TM Distance: >3 FB Neck ROM: Full    Dental no notable dental hx.    Pulmonary asthma ,    Pulmonary exam normal        Cardiovascular negative cardio ROS Normal cardiovascular exam     Neuro/Psych Anxiety Depression Bipolar Disorder Schizophrenia    GI/Hepatic negative GI ROS, Neg liver ROS,   Endo/Other  negative endocrine ROS  Renal/GU negative Renal ROS  negative genitourinary   Musculoskeletal negative musculoskeletal ROS (+)   Abdominal   Peds  Hematology negative hematology ROS (+)   Anesthesia Other Findings Day of surgery medications reviewed with patient.  Reproductive/Obstetrics (+) Pregnancy                             Anesthesia Physical Anesthesia Plan  ASA: 2  Anesthesia Plan: Epidural   Post-op Pain Management:    Induction:   PONV Risk Score and Plan: Treatment may vary due to age or medical condition  Airway Management Planned: Natural Airway  Additional Equipment:   Intra-op Plan:   Post-operative Plan:   Informed Consent: I have reviewed the patients History and Physical, chart, labs and discussed the procedure including the risks, benefits and alternatives for the proposed anesthesia with the patient or authorized representative who has indicated his/her understanding and acceptance.       Plan Discussed with:   Anesthesia Plan Comments:         Anesthesia Quick Evaluation

## 2021-04-25 ENCOUNTER — Encounter (HOSPITAL_COMMUNITY): Payer: Self-pay | Admitting: Family Medicine

## 2021-04-25 DIAGNOSIS — Z3A38 38 weeks gestation of pregnancy: Secondary | ICD-10-CM

## 2021-04-25 DIAGNOSIS — O99820 Streptococcus B carrier state complicating pregnancy: Secondary | ICD-10-CM

## 2021-04-25 DIAGNOSIS — O365930 Maternal care for other known or suspected poor fetal growth, third trimester, not applicable or unspecified: Secondary | ICD-10-CM

## 2021-04-25 DIAGNOSIS — O368331 Maternal care for abnormalities of the fetal heart rate or rhythm, third trimester, fetus 1: Secondary | ICD-10-CM

## 2021-04-25 MED ORDER — BUPIVACAINE HCL (PF) 0.25 % IJ SOLN
INTRAMUSCULAR | Status: DC | PRN
Start: 2021-04-25 — End: 2021-04-25
  Administered 2021-04-25: 8 mL via EPIDURAL

## 2021-04-25 MED ORDER — IBUPROFEN 600 MG PO TABS
600.0000 mg | ORAL_TABLET | Freq: Four times a day (QID) | ORAL | Status: DC
  Administered 2021-04-25 – 2021-04-26 (×5): 600 mg via ORAL
  Filled 2021-04-25 (×6): qty 1

## 2021-04-25 MED ORDER — DIPHENHYDRAMINE HCL 25 MG PO CAPS: 25.0000 mg | ORAL_CAPSULE | Freq: Four times a day (QID) | ORAL | Status: DC | PRN

## 2021-04-25 MED ORDER — ONDANSETRON HCL 4 MG/2ML IJ SOLN: 4.0000 mg | INTRAMUSCULAR | Status: DC | PRN

## 2021-04-25 MED ORDER — BENZOCAINE-MENTHOL 20-0.5 % EX AERO: 1.0000 "application " | INHALATION_SPRAY | CUTANEOUS | Status: DC | PRN

## 2021-04-25 MED ORDER — LIDOCAINE HCL (PF) 1 % IJ SOLN
INTRAMUSCULAR | Status: DC | PRN
  Administered 2021-04-25 (×2): 4 mL via EPIDURAL

## 2021-04-25 MED ORDER — WITCH HAZEL-GLYCERIN EX PADS: 1.0000 "application " | MEDICATED_PAD | CUTANEOUS | Status: DC | PRN

## 2021-04-25 MED ORDER — TETANUS-DIPHTH-ACELL PERTUSSIS 5-2.5-18.5 LF-MCG/0.5 IM SUSY: 0.5000 mL | PREFILLED_SYRINGE | Freq: Once | INTRAMUSCULAR | Status: DC

## 2021-04-25 MED ORDER — DIBUCAINE (PERIANAL) 1 % EX OINT: 1.0000 "application " | TOPICAL_OINTMENT | CUTANEOUS | Status: DC | PRN

## 2021-04-25 MED ORDER — SIMETHICONE 80 MG PO CHEW: 80.0000 mg | CHEWABLE_TABLET | ORAL | Status: DC | PRN

## 2021-04-25 MED ORDER — COCONUT OIL OIL
1.0000 "application " | TOPICAL_OIL | Status: DC | PRN
  Administered 2021-04-26: 1 via TOPICAL

## 2021-04-25 MED ORDER — ONDANSETRON HCL 4 MG PO TABS: 4.0000 mg | ORAL_TABLET | ORAL | Status: DC | PRN

## 2021-04-25 MED ORDER — OXYTOCIN-SODIUM CHLORIDE 30-0.9 UT/500ML-% IV SOLN: 2.5000 [IU]/h | INTRAVENOUS | Status: DC | PRN

## 2021-04-25 MED ORDER — ACETAMINOPHEN 325 MG PO TABS
650.0000 mg | ORAL_TABLET | ORAL | Status: DC | PRN
  Administered 2021-04-25 – 2021-04-26 (×4): 650 mg via ORAL
  Filled 2021-04-25 (×4): qty 2

## 2021-04-25 MED ORDER — PRENATAL MULTIVITAMIN CH
1.0000 | ORAL_TABLET | Freq: Every day | ORAL | Status: DC
  Administered 2021-04-26: 1 via ORAL
  Filled 2021-04-25: qty 1

## 2021-04-25 NOTE — Lactation Note (Signed)
This note was copied from a baby's chart. Lactation Consultation Note  Patient Name: Melissa [name] Date: 04/25/2021 Reason for consult: 1st time breastfeeding;Primapara;NICU baby;Initial assessment Age:27 hours  Spoke to Cisco from [facility] and she reported that mom hasn't come back from NICU yet. She'll be setting up a DEBP once she comes back.  Maternal Data Has patient been taught Hand Expression?: Yes Does the patient have breastfeeding experience prior to this delivery?: No  Feeding Mother's Current Feeding Choice: Breast Milk and Formula  LATCH Score Latch: Grasps breast easily, tongue down, lips flanged, rhythmical sucking.  Audible Swallowing: None  Type of Nipple: Everted at rest and after stimulation  Comfort (Breast/Nipple): Soft / non-tender  Hold (Positioning): Assistance needed to correctly position infant at breast and maintain latch.  LATCH Score: 7   Lactation Tools Discussed/Used    Interventions Interventions: Breast feeding basics reviewed;DEBP;Education;Assisted with latch;Skin to skin;Breast massage  Discharge Pump:  (no pump at home) [facility] Program: Yes  Consult Status Consult Status: Follow-up Follow-up type: In-patient    Navada Osterhout Venetia Constable 04/25/2021, 3:46 PM

## 2021-04-25 NOTE — Progress Notes (Signed)
OB Note L&D Note  04/25/2021 - 2:36 AM  25 y.o. G2P0010 [redacted]w[redacted]d. Pregnancy complicated by FGR (2414gm on 8/15), fetal tetraology of fallot, schizoaffective d/o, GBS positive  Patient Active Problem List   Diagnosis Date Noted   Fetal growth retardation, antepartum 04/23/2021   Blow out fracture of orbit (HCC) 01/30/2021   Pneumothorax, traumatic 06/12/2020   Schizoaffective disorder, depressive type (HCC) 07/19/2016    Melissa [name] is admitted for IOL for FGR   Subjective:  Patient moved up to L&D a few hours b/c she was okay with staying proceeding with delivery Patient comfortable with an epidural  Objective:   Vitals:   04/25/21 0214 04/25/21 0216 04/25/21 0226 04/25/21 0231  BP: 109/88 111/69 109/68 101/68  Pulse: (!) 136 85 70 85  Resp:      Temp:      TempSrc:      SpO2:      Weight:      Height:        Current Vital Signs 24h Vital Sign Ranges  T 98.6 F (37 C) Temp  Avg: 98.6 F (37 C)  Min: 98.3 F (36.8 C)  Max: 98.8 F (37.1 C)  BP 101/68 BP  Min: 96/60  Max: 142/87  HR 85 Pulse  Avg: 69.3  Min: 52  Max: 136  RR 20 Resp  Avg: 20.9  Min: 16  Max: 98  SaO2 98 %   No data recorded       24 Hour I/O Current Shift I/O  Time Ins Outs 08/29 0701 - 08/30 0700 In: -  Out: 100  08/29 1901 - 08/30 0700 In: -  Out: 100    FHR: 150 baseline, occasional variables, no accels, mod variablity Toco: +irritability, ?q2-8m UCs Gen: NAD SVE: deferred, 8/90/0 at 0115 per RN  Labs:  Recent Labs  Lab 04/23/21 1312  WBC 6.7  HGB 10.0*  HCT 29.7*  PLT 181     Medications Current Facility-Administered Medications  Medication Dose Route Frequency Provider Last Rate Last Admin   acetaminophen (TYLENOL) tablet 650 mg  650 mg Oral Q4H PRN Jacklyn Shell, CNM   650 mg at 04/24/21 2030   diphenhydrAMINE (BENADRYL) injection 12.5 mg  12.5 mg Intravenous Q15 min PRN Mellody Dance, MD       ePHEDrine injection 10 mg  10 mg Intravenous PRN Mellody Dance, MD       ePHEDrine injection 10 mg  10 mg Intravenous PRN Mellody Dance, MD       fentaNYL (SUBLIMAZE) injection 50-100 mcg  50-100 mcg Intravenous Q1H PRN Jacklyn Shell, CNM   100 mcg at 04/24/21 2155   fentaNYL 2 mcg/mL w/ bupivacaine 0.125% in NS 250 mL epidural infusion  12 mL/hr Epidural Continuous PRN Mellody Dance, MD 12 mL/hr at 04/25/21 0010 12 mL/hr at 04/25/21 0010   hydrOXYzine (ATARAX/VISTARIL) tablet 50 mg  50 mg Oral Q6H PRN Cresenzo-Dishmon, Scarlette Calico, CNM       lactated ringers infusion 500 mL  500 mL Intravenous Once Mellody Dance, MD       lactated ringers infusion 500-1,000 mL  500-1,000 mL Intravenous PRN Cresenzo-Dishmon, Scarlette Calico, CNM       lactated ringers infusion   Intravenous Continuous Jacklyn Shell, CNM 125 mL/hr at 04/24/21 2149 New Bag at 04/24/21 2149   lidocaine (PF) (XYLOCAINE) 1 % injection 30 mL  30 mL Subcutaneous PRN Cresenzo-Dishmon, Scarlette Calico, CNM       misoprostol (CYTOTEC)  tablet 50 mcg  50 mcg Buccal Q4H PRN Armando Reichert, CNM   50 mcg at 04/23/21 1358   ondansetron (ZOFRAN) injection 4 mg  4 mg Intravenous Q6H PRN Cresenzo-Dishmon, Scarlette Calico, CNM       oxyCODONE-acetaminophen (PERCOCET/ROXICET) 5-325 MG per tablet 1 tablet  1 tablet Oral Q4H PRN Cresenzo-Dishmon, Scarlette Calico, CNM       oxyCODONE-acetaminophen (PERCOCET/ROXICET) 5-325 MG per tablet 2 tablet  2 tablet Oral Q4H PRN Cresenzo-Dishmon, Scarlette Calico, CNM       oxytocin (PITOCIN) IV BOLUS FROM BAG  333 mL Intravenous Once Cresenzo-Dishmon, Scarlette Calico, CNM       oxytocin (PITOCIN) IV infusion 30 units in NS 500 mL - Premix  2.5 Units/hr Intravenous Continuous Cresenzo-Dishmon, Scarlette Calico, CNM       oxytocin (PITOCIN) IV infusion 30 units in NS 500 mL - Premix  1-40 milli-units/min Intravenous Titrated Allayne Stack, DO   Stopped at 04/24/21 1337   penicillin G potassium 3 Million Units in dextrose 65mL IVPB  3 Million Units Intravenous Q4H Shirlean Mylar, MD 100 mL/hr at 04/24/21  2210 3 Million Units at 04/24/21 2210   PHENYLephrine 40 mcg/ml in normal saline Adult IV Push Syringe (For Blood Pressure Support)  80 mcg Intravenous PRN Mellody Dance, MD       PHENYLephrine 40 mcg/ml in normal saline Adult IV Push Syringe (For Blood Pressure Support)  80 mcg Intravenous PRN Mellody Dance, MD       sodium citrate-citric acid (ORACIT) solution 30 mL  30 mL Oral Q2H PRN Cresenzo-Dishmon, Scarlette Calico, CNM       sodium phosphate (FLEET) 7-19 GM/118ML enema 1 enema  1 enema Rectal PRN Cresenzo-Dishmon, Scarlette Calico, CNM       terbutaline (BRETHINE) injection 0.25 mg  0.25 mg Subcutaneous Once PRN Cresenzo-Dishmon, Scarlette Calico, CNM       terbutaline (BRETHINE) injection 0.25 mg  0.25 mg Subcutaneous Once PRN Beard, Samantha N, DO       zolpidem (AMBIEN) tablet 5 mg  5 mg Oral QHS PRN Cresenzo-Dishmon, Scarlette Calico, CNM       Facility-Administered Medications Ordered in Other Encounters  Medication Dose Route Frequency Provider Last Rate Last Admin   bupivacaine (PF) (MARCAINE) 0.25 % injection   Epidural Anesthesia Intra-op Kaylyn Layer, MD   8 mL at 04/25/21 0211   lidocaine (PF) (XYLOCAINE) 1 % injection   Epidural Anesthesia Intra-op Kaylyn Layer, MD   4 mL at 04/25/21 0010    Assessment & Plan:  Pt stable *IUP: category II tracing but overalll reassuring. See below *IOL: I asked her if it would be okay for AROM and she would like to rest for a little while longer (an hour or so) first. I told her that's fine and I asked her if it was okay to restart the PCN and she was fine with that *Fetal cardiac anomalies: peds at delivery. Needs post birth echo.  *GBS: pt amenable to restarting PCN *Analgesia: comfortable with epidural  Cornelia Copa MD Attending Center for [facility])

## 2021-04-25 NOTE — Anesthesia Procedure Notes (Signed)
Epidural Patient location during procedure: OB Start time: 04/25/2021 12:03 AM End time: 04/25/2021 12:07 AM  Staffing Anesthesiologist: Kaylyn Layer, MD Performed: anesthesiologist   Preanesthetic Checklist Completed: patient identified, IV checked, risks and benefits discussed, monitors and equipment checked, pre-op evaluation and timeout performed  Epidural Patient position: sitting Prep: DuraPrep and site prepped and draped Patient monitoring: continuous pulse ox, blood pressure and heart rate Approach: midline Location: L3-L4 Injection technique: LOR air  Needle:  Needle type: Tuohy  Needle gauge: 17 G Needle length: 9 cm Needle insertion depth: 5 cm Catheter type: closed end flexible Catheter size: 19 Gauge Catheter at skin depth: 10 cm Test dose: negative and Other (1% lidocaine)  Assessment Events: blood not aspirated, injection not painful, no injection resistance, no paresthesia and negative IV test  Additional Notes Patient identified. Risks, benefits, and alternatives discussed with patient including but not limited to bleeding, infection, nerve damage, paralysis, failed block, incomplete pain control, headache, blood pressure changes, nausea, vomiting, reactions to medication, itching, and postpartum back pain. Confirmed with bedside nurse the patient's most recent platelet count. Confirmed with patient that they are not currently taking any anticoagulation, have any bleeding history, or any family history of bleeding disorders. Patient expressed understanding and wished to proceed. All questions were answered. Sterile technique was used throughout the entire procedure. Please see nursing notes for vital signs.   Crisp LOR on first pass. Test dose was given through epidural catheter and negative prior to continuing to dose epidural or start infusion. Warning signs of high block given to the patient including shortness of breath, tingling/numbness in hands, complete  motor block, or any concerning symptoms with instructions to call for help. Patient was given instructions on fall risk and not to get out of bed. All questions and concerns addressed with instructions to call with any issues or inadequate analgesia.  Reason for block:procedure for pain

## 2021-04-25 NOTE — Anesthesia Postprocedure Evaluation (Signed)
Anesthesia Post Note  Patient: Melissa [name]  Procedure(s) Performed: AN AD HOC LABOR EPIDURAL     Patient location during evaluation: Mother Baby Anesthesia Type: Epidural Level of consciousness: awake and alert and oriented Pain management: satisfactory to patient Vital Signs Assessment: post-procedure vital signs reviewed and stable Respiratory status: respiratory function stable Cardiovascular status: stable Postop Assessment: no headache, no backache, epidural receding, patient able to bend at knees, no signs of nausea or vomiting, adequate PO intake and able to ambulate Anesthetic complications: no   No notable events documented.  Last Vitals:  Vitals:   04/25/21 0613 04/25/21 0729  BP: 125/71 (!) 105/50  Pulse: (!) 55 (!) 57  Resp: 17 18  Temp: 36.4 C (!) 36.2 C  SpO2: 100%     Last Pain:  Vitals:   04/25/21 1333  TempSrc:   PainSc: 7    Pain Goal: Patients Stated Pain Goal: 3 (04/25/21 1051)                 Tresa Jolley

## 2021-04-25 NOTE — Lactation Note (Signed)
This note was copied from a baby's chart. Lactation Consultation Note  Patient Name: Girl Shatia Sindoni CRFVO'H Date: 04/25/2021   Age:27 hours   LC Note:  Mother resting; room dark and quiet.  Baby was transferred to the NICU.  Lactation services will be provided by the NICU LC later today.  Mother will transfer to [facility], Room 108.   Maternal Data    Feeding    LATCH Score                    Lactation Tools Discussed/Used    Interventions    Discharge    Consult Status      Santosh Petter R Ioana Louks 04/25/2021, 5:07 AM

## 2021-04-25 NOTE — Lactation Note (Signed)
This note was copied from a baby's chart. Lactation Consultation Note  Patient Name: Melissa [name] [id number] Date: 04/25/2021 Reason for consult: 1st time breastfeeding;Primapara;NICU baby;Initial assessment Age:27 hours  Visited with mom of 8 hours old ETI female, she's a P1 and reported (+) breast changes during the pregnancy. LC assisted with hand expression (no colostrum noted) and latching; this consult took place in baby's room, RN Jen from [facility] will be setting up a DEBP in mom's room.  Baby still receiving fluids through her IV on her foot, LC had to be careful with positioning, tried the cross cradle hold on the left breast and baby latched easily but tires easily as well. No audible swallows noted during this 5 minutes feedings, however baby cueing and sucking with some rhythm.  Reviewed pumping schedule, pumping log and benefits of STS care.  Plan of care:  Encouraged mom to start pumping every 2-3 hours, at least 8 pumping sessions/24 hours STS care was also encouraged as well as putting baby to breast on feeding cues, NICU RN reported baby status changed to ad lib  BF brochure, BF resources and NICU booklet were reviewed. GOB (maternal) present and supportive. All questions and concerns answered, family to call NICU LC PRN.  Maternal Data Has patient been taught Hand Expression?: Yes Does the patient have breastfeeding experience prior to this delivery?: No  Feeding Mother's Current Feeding Choice: Breast Milk and Formula  LATCH Score Latch: Grasps breast easily, tongue down, lips flanged, rhythmical sucking.  Audible Swallowing: None  Type of Nipple: Everted at rest and after stimulation  Comfort (Breast/Nipple): Soft / non-tender  Hold (Positioning): Assistance needed to correctly position infant at breast and maintain latch.  LATCH Score: 7   Lactation Tools Discussed/Used    Interventions Interventions: Breast feeding basics  reviewed;DEBP;Education;Assisted with latch;Skin to skin;Breast massage  Discharge Pump:  (no pump at home) [facility] Program: Yes  Consult Status Consult Status: Follow-up Follow-up type: In-patient    Nigel Ericsson Venetia Constable 04/25/2021, 12:18 PM

## 2021-04-25 NOTE — BH Assessment (Signed)
Clinician sent the following internal IM to pt's team in regards to the TTS Assessment that was ordered:  Good (early) morning! I reviewed this pt's notes and consulted with our NP, Nira Conn, and my fellow TTS and we agree that the best plan of action is to have pt seen by NP Starkes-Perry in the morning. A TTS (MH) assessment would be most appropriate if there was question as to whether pt needed to be admitted to [facility], referred for services, etc. An NP completing a psych assessment will come to the hospital, meet with pt face-to-face, consult with pt's team, etc. Do to pt currently being in active labor, we think an assessment with NP Starkes-Perry will best meet her needs. If possible, please d/c the order for the TTS Assessment and ensure an assessment for psychiatry is ordered in its place.   Please let me know if there are any questions regarding this.   Thank you for thinking of [place] to help your patient! Sam :)

## 2021-04-26 ENCOUNTER — Inpatient Hospital Stay (HOSPITAL_COMMUNITY): Payer: Medicaid Other

## 2021-04-26 ENCOUNTER — Inpatient Hospital Stay (HOSPITAL_COMMUNITY)
Admission: AD | Admit: 2021-04-26 | Payer: Medicaid Other | Source: Home / Self Care | Admitting: Obstetrics and Gynecology

## 2021-04-26 LAB — SURGICAL PATHOLOGY

## 2021-04-26 MED ORDER — IBUPROFEN 600 MG PO TABS
600.0000 mg | ORAL_TABLET | Freq: Four times a day (QID) | ORAL | 0 refills | Status: DC
Start: 2021-04-26 — End: 2021-10-10

## 2021-04-26 NOTE — Progress Notes (Signed)
Teaching complete  pt is going to nicu  to see baby

## 2021-04-26 NOTE — Lactation Note (Signed)
This note was copied from a baby's chart. Lactation Consultation Note Mother to d/c today. Baby has bf in NICU. Mom initiated pumping this morning and will f/u with [facility] for a loaner pump. Ed provided on hand pump use prn.   Patient Name: Girl Olayinka Gathers FUXNA'T Date: 04/26/2021 Reason for consult: Follow-up assessment Age:27 hours  Maternal Data + breast changes during pregnancy, no hx breast trauma or surgery  Interventions Interventions: Education  Discharge Discharge Education: Engorgement and breast care  Consult Status Consult Status: Follow-up Follow-up type: In-patient   Elder Negus, MA IBCLC 04/26/2021, 11:58 AM

## 2021-04-26 NOTE — Progress Notes (Signed)
Pt has not been instructed to pump  pump set up and pt given instruction  to pump  will come back later and review with pt

## 2021-04-26 NOTE — Progress Notes (Signed)
Pt off unit, in nicu   
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
## 2021-04-26 NOTE — Discharge Summary (Signed)
Postpartum Discharge Summary  Date of Service updated 04/26/2021    Patient Name: Melissa [name] DOB: 02-08-1996 MRN: [id number]  Date of admission: 04/23/2021 Delivery date:04/25/2021  Delivering provider: Aletha Halim  Date of discharge: 04/26/2021  Admitting diagnosis: Fetal growth retardation, antepartum [O36.5990] Intrauterine pregnancy: [redacted]w[redacted]d    Secondary diagnosis:  Active Problems:   Fetal growth retardation, antepartum   GBS (group B Streptococcus carrier), +RV culture, currently pregnant  Additional problems: Schizoaffective disorder    Discharge diagnosis: Term Pregnancy Delivered                                              Post partum procedures: no Augmentation: AROM, Pitocin, Cytotec, and IP Foley Complications: None  Hospital course: Induction of Labor With Vaginal Delivery   25y.o. yo G2P0010 at 347w0das admitted to the hospital 04/23/2021 for induction of labor.  Indication for induction:  IUGR and fetal cardiac defect, suspected TOF .  Patient had an uncomplicated labor course as follows: Membrane Rupture Time/Date:  ,   Delivery Method:Vaginal, Spontaneous  Episiotomy: None  Lacerations:  None  Details of delivery can be found in separate delivery note.  Patient had a routine postpartum course. Patient is discharged home 04/26/21.  Newborn Data: Birth date:04/25/2021  Birth time:4:01 AM  Gender:Female  Living status:Living  Apgars:8 ,9  Weight:2530 g   Magnesium Sulfate received: No BMZ received: No Rhophylac:No MMR:No T-DaP:Given prenatally Flu: No Transfusion:No  Physical exam  Vitals:   04/25/21 1959 04/25/21 2352 04/26/21 0335 04/26/21 0802  BP: 116/74 123/84 140/86 107/64  Pulse: 68 65 (!) 59 (!) 57  Resp: '17 18 17 16  ' Temp: 97.8 F (36.6 C) 97.9 F (36.6 C) 97.6 F (36.4 C) 98.3 F (36.8 C)  TempSrc: Oral Oral Oral Oral  SpO2: 100% 100% 100% 97%  Weight:      Height:       General: alert, cooperative, and no  distress Lochia: appropriate Uterine Fundus: firm Incision: N/A DVT Evaluation: No evidence of DVT seen on physical exam. Labs: Lab Results  Component Value Date   WBC 6.7 04/23/2021   HGB 10.0 (L) 04/23/2021   HCT 29.7 (L) 04/23/2021   MCV 87.9 04/23/2021   PLT 181 04/23/2021   CMP Latest Ref Rng & Units 08/20/2020  Glucose 70 - 99 mg/dL 80  BUN 6 - 20 mg/dL 8  Creatinine 0.44 - 1.00 mg/dL 0.90  Sodium 135 - 145 mmol/L 136  Potassium 3.5 - 5.1 mmol/L 3.6  Chloride 98 - 111 mmol/L 106  CO2 22 - 32 mmol/L 20(L)  Calcium 8.9 - 10.3 mg/dL 8.9  Total Protein 6.5 - 8.1 g/dL 7.1  Total Bilirubin 0.3 - 1.2 mg/dL 0.4  Alkaline Phos 38 - 126 U/L 58  AST 15 - 41 U/L 18  ALT 0 - 44 U/L 15   Edinburgh Score: No flowsheet data found.   After visit meds:  Allergies as of 04/26/2021   No Known Allergies      Medication List     STOP taking these medications    acetaminophen 325 MG tablet Commonly known as: TYLENOL       TAKE these medications    ibuprofen 600 MG tablet Commonly known as: ADVIL Take 1 tablet (600 mg total) by mouth every 6 (six) hours.   multivitamin-prenatal 27-0.8 MG  Tabs tablet Take 1 tablet by mouth daily at 12 noon.         Discharge home in stable condition Infant Feeding: Bottle Infant Disposition:NICU Discharge instruction: per After Visit Summary and Postpartum booklet. Activity: Advance as tolerated. Pelvic rest for 6 weeks.  Diet: routine diet Future Appointments:No future appointments. Follow up Visit:  [facility] Follow up in 4 week(s).   Specialty: Obstetrics and Gynecology Contact information: [address] [place] [phone]                 Please schedule this patient for a In person postpartum visit in 4 weeks with the following provider: Any provider. Additional Postpartum F/U:Postpartum Depression checkup   High risk pregnancy complicated by:  IUGR and fetal cardiac defect Delivery mode:  Vaginal, Spontaneous  Anticipated Birth Control:  IUD   04/26/2021 Emeterio Reeve, MD

## 2021-04-27 ENCOUNTER — Ambulatory Visit: Payer: Self-pay

## 2021-04-27 ENCOUNTER — Telehealth (INDEPENDENT_AMBULATORY_CARE_PROVIDER_SITE_OTHER): Payer: Medicaid Other | Admitting: Osteopathic Medicine

## 2021-04-27 NOTE — Lactation Note (Signed)
This note was copied from a baby's chart. Lactation Consultation Note  Patient Name: Girl Ommie Degeorge [id number] Date: 04/27/2021 Reason for consult: Follow-up assessment;1st time breastfeeding;NICU baby;Early term 37-38.6wks Age:27 hours  Lactation followed up with Ms. Soloway. Baby had a desaturation upon entry, and RN entered. As baby recovered, Ms. Glatt was visibly upset. RN offered reassurance and education on "look-fors" related to desat.  I then spoke with Ms. Kratky. She states that pumping is uncomfortable and she does not want to pump. She states that she is latching baby in the evening, and prefers to do that. She expressed concerns that her milk has not "come in." Baby is now 42 hours old. Ms. Badami expressed that she is okay with providing baby ABM.  I educated on strategies to promote milk production including latching baby and hand expression. Ms. Vahey verbalized understanding. I provided our lactation brochure for follow up support. She declined help at this visit; I encouraged her to call for breast feeding help as needed.   Ms. Brian has been referred to follow up with [facility].   Feeding Mother's Current Feeding Choice: Breast Milk and Formula Nipple Type: Slow - flow   Interventions Interventions: Education  Discharge Discharge Education: Outpatient recommendation  Consult Status Consult Status: PRN Date: 04/27/21 Follow-up type: In-patient    Walker Shadow 04/27/2021, 12:57 PM

## 2021-04-29 ENCOUNTER — Ambulatory Visit: Payer: Self-pay

## 2021-04-29 NOTE — Lactation Note (Signed)
This note was copied from a baby's chart. Lactation Consultation Note LC to room at request of RN. Mother's milk is in and she is offering breast and bottle today. She is able to self-latch and baby intermittently looks effective while bf'ing. Baby frequently unlatches after 2-3 audible swallows to breathe and then easily re-latches. Baby will likely tire out quickly using this strategy.   Mother is resistant to her milk going to the milk lab. After a lengthy discussion, the following plan was crafted. Mother was able to teach-back plan and actively participated in the plan of care.  Mother will bottle feed fortified formula q3. Mother will bf on demand between bottle/formula feedings. Mother will pump prn and place her milk in the refrigerator. She will use the cooler bag provided and take any EBM home. Mother will not feed unfortified EBM to baby in NICU.  Patient Name: Girl Dixie Coppa [id number] Date: 04/29/2021 Reason for consult: Follow-up assessment;MD order Age:27 days  Maternal Data  + breast changes. No s/s of engorgement today. Mother is pumping and bf'ing.   Feeding Mother's Current Feeding Choice: Breast Milk and Formula Nipple Type: Nfant Slow Flow (purple)  LATCH Score Latch: Grasps breast easily, tongue down, lips flanged, rhythmical sucking.  Audible Swallowing: Spontaneous and intermittent  Type of Nipple: Everted at rest and after stimulation  Comfort (Breast/Nipple): Soft / non-tender  Hold (Positioning): No assistance needed to correctly position infant at breast.  LATCH Score: 10 (This score is not a good representation of baby's ability. L's were very brief. Baby unlatched to breathe and self re-latched).    Lactation Tools Discussed/Used Tools: Other (comment) (breast pads, cooler bag)  Interventions Interventions: Education  Consult Status Consult Status: Follow-up Follow-up type: In-patient   Elder Negus 04/29/2021, 10:02 AM

## 2021-05-01 ENCOUNTER — Emergency Department (HOSPITAL_COMMUNITY)
Admission: EM | Admit: 2021-05-01 | Discharge: 2021-05-03 | Disposition: A | Discharge status: Other Acute Inpatient Hospital | Payer: Medicaid Other | Attending: Emergency Medicine | Admitting: Emergency Medicine

## 2021-05-01 ENCOUNTER — Encounter (HOSPITAL_COMMUNITY): Payer: Self-pay

## 2021-05-01 ENCOUNTER — Ambulatory Visit: Payer: Self-pay

## 2021-05-01 ENCOUNTER — Other Ambulatory Visit: Payer: Self-pay

## 2021-05-01 DIAGNOSIS — F250 Schizoaffective disorder, bipolar type: Secondary | ICD-10-CM | POA: Insufficient documentation

## 2021-05-01 DIAGNOSIS — Z20822 Contact with and (suspected) exposure to covid-19: Secondary | ICD-10-CM | POA: Diagnosis not present

## 2021-05-01 DIAGNOSIS — Z046 Encounter for general psychiatric examination, requested by authority: Secondary | ICD-10-CM | POA: Diagnosis present

## 2021-05-01 DIAGNOSIS — J45909 Unspecified asthma, uncomplicated: Secondary | ICD-10-CM | POA: Insufficient documentation

## 2021-05-01 DIAGNOSIS — F22 Delusional disorders: Secondary | ICD-10-CM | POA: Insufficient documentation

## 2021-05-01 DIAGNOSIS — Y900 Blood alcohol level of less than 20 mg/100 ml: Secondary | ICD-10-CM | POA: Diagnosis not present

## 2021-05-01 DIAGNOSIS — R451 Restlessness and agitation: Secondary | ICD-10-CM | POA: Insufficient documentation

## 2021-05-01 DIAGNOSIS — Z79899 Other long term (current) drug therapy: Secondary | ICD-10-CM | POA: Insufficient documentation

## 2021-05-01 MED ORDER — ZIPRASIDONE MESYLATE 20 MG IM SOLR
20.0000 mg | Freq: Once | INTRAMUSCULAR | Status: AC | PRN
  Administered 2021-05-03: 20 mg via INTRAMUSCULAR
  Filled 2021-05-01: qty 20

## 2021-05-01 NOTE — ED Triage Notes (Signed)
Patient has a hx of schizophrenia. Patient told dr that she wanted to hurt herself and the baby. Patient was throwing things in the nicu where the baby is located.

## 2021-05-01 NOTE — Lactation Note (Signed)
This note was copied from a baby's chart. Lactation Consultation Note Mother has decreased pumping frequency. She continues to bf when baby is cuing. She has no s/s of engorgement.   Patient Name: Melissa [name] [id number] Date: 05/01/2021 Reason for consult: Follow-up assessment Age:27 days  Maternal Data  Tools: Pump Pumping frequency: 2 x today  Feeding Mother's Current Feeding Choice: Breast Milk and Formula Nipple Type: Nfant Slow Flow (purple)  Interventions Interventions: Education   Consult Status Consult Status: Follow-up Date: 05/01/21 Follow-up type: In-patient   Elder Negus 05/01/2021, 3:03 PM
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
## 2021-05-01 NOTE — ED Provider Notes (Signed)
[facility]-EMERGENCY DEPT Provider Note   CSN: [id number] Arrival date & time: 05/01/21  2317     History Chief Complaint  Patient presents with   Suicidal    Melissa [name] is a 25 y.o. female with a history of schizophrenia, bipolar disorder, PTSD who presents the emergency department by police under IVC.  Per IVC paperwork: Patient has a history of schizophrenia.  Patient told doctor that she wanted to hurt herself and the baby.  Patient was throwing things in the NICU and being aggressive with staff.  On my evaluation, patient was noted to be screaming and cursing at staff when she was brought into the ED.  She is intermittently redirectable.  Speech is rapid, pressured, and tangential, but it sounds as if the patient believes that the staff in the NICU is sexually molesting her child.  Reports that she had a vaginal delivery 5 days ago.  She also seems to have poor insight into her daughter's health.  She states " they will not let her eat and put a tube down her throat.  She is going to develop a vitamin B deficiency if they do not let her outside in the sunlight.  All he wanted to do was take her outside and let her play in the grass."  Reports that she was asking the father of her child to sneak food into the room with the NICU and states that she got into an altercation with staff after they asked her to remove all the food from the room.  She states that she then became very upset and was stating that she wanted to kill herself and was a threat to herself and her baby."  She denies suicidal ideation at this time and states that the the comment was just to get attention so that she could try and get her baby discharged from the hospital.  She denies HI, but says that she was feeling increasingly agitated by NICU staff after they asked her to remove all the food.  She denies auditory visual hallucinations.  She states that CPS became involved today. "They came  in, but then stepped out to take a phone call and then never came back."  She states that she was told that in order to be able to take her daughter out of the hospital that she had to come to be evaluated by psychiatry.  Per police, CPS has taken the infant from the patient's care.  She is requesting something to eat and drink.  History is otherwise limited as she is refusing to answer any questions regarding her own health.  She is refusing exam and to change into scrubs.  Level 5 caveat secondary to psychosis.  The history is provided by the patient, medical records and the police. The history is limited by the condition of the patient. No language interpreter was used.      Past Medical History:  Diagnosis Date   Anxiety    Asthma    Bipolar 1 disorder (HCC)    Chlamydia    Collapsed lung 06/11/2020   Depression    Dizziness    Gonorrhea    PTSD (post-traumatic stress disorder)    Rib fracture    Syncope     Patient Active Problem List   Diagnosis Date Noted   GBS (group B Streptococcus carrier), +RV culture, currently pregnant 04/25/2021   Fetal growth retardation, antepartum 04/23/2021   Blow out fracture of orbit (HCC) 01/30/2021  Pneumothorax, traumatic 06/12/2020   Schizoaffective disorder, depressive type (HCC) 07/19/2016    Past Surgical History:  Procedure Laterality Date   chest tube  06/11/2020     OB History     Gravida  2   Para  0   Term  0   Preterm  0   AB  1   Living  0      SAB  1   IAB  0   Ectopic  0   Multiple  0   Live Births  0           Family History  Problem Relation Age of Onset   Asthma Mother    Hypertension Father     Social History   Tobacco Use   Smoking status: Never   Smokeless tobacco: Never  Vaping Use   Vaping Use: Never used  Substance Use Topics   Alcohol use: Not Currently   Drug use: Not Currently    Types: Marijuana, Cocaine    Comment: 7 years    Home Medications Prior to  Admission medications   Medication Sig Start Date End Date Taking? Authorizing Provider  ibuprofen (ADVIL) 600 MG tablet Take 1 tablet (600 mg total) by mouth every 6 (six) hours. 04/26/21   Adam Phenix, MD  Prenatal Vit-Fe Fumarate-FA (MULTIVITAMIN-PRENATAL) 27-0.8 MG TABS tablet Take 1 tablet by mouth daily at 12 noon. 09/10/20   Rolm Bookbinder, CNM    Allergies    Patient has no known allergies.  Review of Systems   Review of Systems  Unable to perform ROS: Psychiatric disorder   Physical Exam Updated Vital Signs BP (!) 120/93   Pulse 75   Temp 98.2 F (36.8 C) (Oral)   Resp 16   Ht 5\' 7"  (1.702 m)   Wt 68 kg   LMP 07/27/2020   SpO2 100%   BMI 23.49 kg/m   Physical Exam Vitals and nursing note reviewed.  Constitutional:      Appearance: She is well-developed.  HENT:     Head: Normocephalic and atraumatic.     Nose: Nose normal.     Mouth/Throat:     Mouth: Mucous membranes are moist.  Cardiovascular:     Rate and Rhythm: Normal rate.  Pulmonary:     Effort: Pulmonary effort is normal.  Abdominal:     General: Abdomen is flat.  Musculoskeletal:        General: Normal range of motion.     Cervical back: Normal range of motion.  Neurological:     Mental Status: She is alert.  Psychiatric:        Attention and Perception: She does not perceive auditory or visual hallucinations.        Mood and Affect: Affect is labile, flat and angry.        Speech: Speech is rapid and pressured and tangential.        Behavior: Behavior is agitated and aggressive.        Thought Content: Thought content is paranoid and delusional. Thought content does not include homicidal or suicidal ideation. Thought content does not include homicidal or suicidal plan.        Judgment: Judgment is impulsive and inappropriate.    ED Results / Procedures / Treatments   Labs (all labs ordered are listed, but only abnormal results are displayed) Labs Reviewed  COMPREHENSIVE METABOLIC  PANEL - Abnormal; Notable for the following components:      Result Value  Potassium 3.2 (*)    Albumin 3.3 (*)    All other components within normal limits  CBC WITH DIFFERENTIAL/PLATELET - Abnormal; Notable for the following components:   Hemoglobin 11.9 (*)    All other components within normal limits  ACETAMINOPHEN LEVEL - Abnormal; Notable for the following components:   Acetaminophen (Tylenol), Serum <10 (*)    All other components within normal limits  SALICYLATE LEVEL - Abnormal; Notable for the following components:   Salicylate Lvl <7.0 (*)    All other components within normal limits  I-STAT BETA HCG BLOOD, ED ([facility], [facility], [facility] ONLY) - Abnormal; Notable for the following components:   I-stat hCG, quantitative 49.8 (*)    All other components within normal limits  RESP PANEL BY RT-PCR (FLU A&B, COVID) ARPGX2  ETHANOL  RAPID URINE DRUG SCREEN, HOSP PERFORMED  TROPONIN I (HIGH SENSITIVITY)  TROPONIN I (HIGH SENSITIVITY)    EKG EKG Interpretation  Date/Time:  Tuesday May 02 2021 00:20:19 EDT Ventricular Rate:  90 PR Interval:  143 QRS Duration: 82 QT Interval:  335 QTC Calculation: 410 R Axis:   71 Text Interpretation: Sinus rhythm ST elev, probable normal early repol pattern Confirmed by Paula LibraMolpus, John ([id number]) on 05/02/2021 12:30:14 AM  Radiology No results found.  Procedures Procedures   Medications Ordered in ED Medications  ziprasidone (GEODON) injection 20 mg (has no administration in time range)  ondansetron (ZOFRAN) tablet 4 mg (has no administration in time range)  alum & mag hydroxide-simeth (MAALOX/MYLANTA) 200-200-20 MG/5ML suspension 30 mL (has no administration in time range)  acetaminophen (TYLENOL) tablet 650 mg (has no administration in time range)    ED Course  I have reviewed the triage vital signs and the nursing notes.  Pertinent labs & imaging results that were available during my care of the patient were reviewed by me and considered in  my medical decision making (see chart for details).    MDM Rules/Calculators/A&P                            25 year old female with a history of schizophrenia, bipolar disorder, PTSD who presents the emergency department under IVC [facility]  The patient had a vaginal delivery 5 days ago and her infant daughter is currently in the NICU.  Per IVC paperwork, patient was making statements that she was a threat to herself and her infant daughter.  She was becoming increasingly aggressive with staff and throwing objects.  On arrival to the was a long emergency department, patient was agitated and shouting profanities at staff.  She has been able to be intermittently redirected, but affect is very labile, inability to redirect the patient is becoming rapidly less effective with interactions.  Geodon was ordered, but after further interactions, patient was agreeable with plan of care in the emergency department.  Labs and imaging of been reviewed and independently interpreted by me.  I-STAT hCG was obtained as part of order sent with 49.8, which is expected 5 days after a vaginal delivery.  Doubt new pregnancy.  There was concern in [place] to on her EKG for elevation.  This was not present in 3 contiguous leads.  Troponin was obtained and was not elevated.  COVID test is negative.  Labs are otherwise reassuring.  Pt medically cleared at this time. Psych hold orders and home med orders placed. TTS consult pending; please see psych team notes for further documentation of  care/dispo. Pt stable at time of med clearance.     Final Clinical Impression(s) / ED Diagnoses Final diagnoses:  Agitation  Delusion [facility])    Rx / DC Orders ED Discharge Orders     None        Van Seymore A, PA-C 05/02/21 0417    Molpus, Jonny Ruiz, MD 05/02/21 [phone]

## 2021-05-02 LAB — CBC WITH DIFFERENTIAL/PLATELET
Abs Immature Granulocytes: 0.05 10*3/uL (ref 0.00–0.07)
Basophils Absolute: 0 10*3/uL (ref 0.0–0.1)
Basophils Relative: 1 %
Eosinophils Absolute: 0.2 10*3/uL (ref 0.0–0.5)
Eosinophils Relative: 4 %
HCT: 36.7 % (ref 36.0–46.0)
Hemoglobin: 11.9 g/dL — ABNORMAL LOW (ref 12.0–15.0)
Immature Granulocytes: 1 %
Lymphocytes Relative: 39 %
Lymphs Abs: 2.3 10*3/uL (ref 0.7–4.0)
MCH: 28.1 pg (ref 26.0–34.0)
MCHC: 32.4 g/dL (ref 30.0–36.0)
MCV: 86.8 fL (ref 80.0–100.0)
Monocytes Absolute: 0.6 10*3/uL (ref 0.1–1.0)
Monocytes Relative: 10 %
Neutro Abs: 2.7 10*3/uL (ref 1.7–7.7)
Neutrophils Relative %: 45 %
Platelets: 270 10*3/uL (ref 150–400)
RBC: 4.23 MIL/uL (ref 3.87–5.11)
RDW: 14.2 % (ref 11.5–15.5)
WBC: 5.8 10*3/uL (ref 4.0–10.5)
nRBC: 0 % (ref 0.0–0.2)

## 2021-05-02 LAB — COMPREHENSIVE METABOLIC PANEL
ALT: 13 U/L (ref 0–44)
AST: 16 U/L (ref 15–41)
Albumin: 3.3 g/dL — ABNORMAL LOW (ref 3.5–5.0)
Alkaline Phosphatase: 102 U/L (ref 38–126)
Anion gap: 9 (ref 5–15)
BUN: 7 mg/dL (ref 6–20)
CO2: 25 mmol/L (ref 22–32)
Calcium: 9.3 mg/dL (ref 8.9–10.3)
Chloride: 107 mmol/L (ref 98–111)
Creatinine, Ser: 0.7 mg/dL (ref 0.44–1.00)
GFR, Estimated: >60 mL/min (ref >60)
Glucose, Bld: 96 mg/dL (ref 70–99)
Potassium: 3.2 mmol/L — ABNORMAL LOW (ref 3.5–5.1)
Sodium: 141 mmol/L (ref 135–145)
Total Bilirubin: 0.5 mg/dL (ref 0.3–1.2)
Total Protein: 7.3 g/dL (ref 6.5–8.1)

## 2021-05-02 LAB — RESP PANEL BY RT-PCR (FLU A&B, COVID) ARPGX2
Influenza A by PCR: NEGATIVE
Influenza B by PCR: NEGATIVE
SARS Coronavirus 2 by RT PCR: NEGATIVE

## 2021-05-02 LAB — RAPID URINE DRUG SCREEN, HOSP PERFORMED
Amphetamines: NOT DETECTED
Barbiturates: NOT DETECTED
Benzodiazepines: NOT DETECTED
Cocaine: NOT DETECTED
Opiates: NOT DETECTED
Tetrahydrocannabinol: NOT DETECTED

## 2021-05-02 LAB — TROPONIN I (HIGH SENSITIVITY): Troponin I (High Sensitivity): 6 ng/L (ref <18)

## 2021-05-02 LAB — I-STAT BETA HCG BLOOD, ED (MC, WL, AP ONLY): I-stat hCG, quantitative: 49.8 m[IU]/mL — ABNORMAL HIGH (ref <5)

## 2021-05-02 LAB — ACETAMINOPHEN LEVEL: Acetaminophen (Tylenol), Serum: <10 ug/mL — ABNORMAL LOW (ref 10–30)

## 2021-05-02 LAB — ETHANOL: Alcohol, Ethyl (B): <10 mg/dL (ref <10)

## 2021-05-02 LAB — SALICYLATE LEVEL: Salicylate Lvl: <7 mg/dL — ABNORMAL LOW (ref 7.0–30.0)

## 2021-05-02 MED ORDER — ALUM & MAG HYDROXIDE-SIMETH 200-200-20 MG/5ML PO SUSP: 30.0000 mL | Freq: Four times a day (QID) | ORAL | Status: DC | PRN

## 2021-05-02 MED ORDER — ACETAMINOPHEN 325 MG PO TABS: 650.0000 mg | ORAL_TABLET | ORAL | Status: DC | PRN

## 2021-05-02 MED ORDER — ONDANSETRON HCL 4 MG PO TABS: 4.0000 mg | ORAL_TABLET | Freq: Three times a day (TID) | ORAL | Status: DC | PRN

## 2021-05-02 NOTE — ED Notes (Signed)
Pt given a sandwich and some juice.

## 2021-05-02 NOTE — ED Notes (Signed)
Patient offered stretcher, but refused choosing to stay in recliner.

## 2021-05-02 NOTE — ED Notes (Signed)
Spoke with [facility]. Patient will be admitted to the [facility] C unit. Dr Forrestine Him will be admitting provider. # to call for report - [phone] Report can be called anytime after 9 am

## 2021-05-02 NOTE — BH Assessment (Signed)
[facility] Assessment Progress Note   Per Nira Conn, NP this pt requires psychiatric hospitalization at this time.  Pt presents under IVC initiated by pt's father and upheld by EDP Meriel Flavors, MD.  [facility] will not be able to accommodate pt at this time, so, per the directive of Nelly Rout, MD, this writer will seek placement for pt.  At 09:57 I received a call from Kallie Locks with [facility] [phone]).  He reports that pt presents from a Neonatal unit. CPS is currently looking out for pt's needs, but they would like to be kept up to date on what happens with the pt.  This information has been entered into pt's FYI's, and I will follow through.  Doylene Canning, [place] [facility] Coordinator [phone]

## 2021-05-02 NOTE — BH Assessment (Signed)
[facility] Assessment Progress Note   Per Nira Conn, NP, this pt requires psychiatric hospitalization at this time.  Pt presents under IVC initiated by pt's father and upheld by EDP Meriel Flavors, MD.  Per Nelly Rout, MD [facility] does not have an appropriate bed for pt at this time, and pt is therefore to be referred to facilities outside of the [name] system.  The following facilities have been contacted to seek placement for this pt, with results as noted:  Beds available, information sent, decision pending: [facility]   At capacity: [facility] (no female beds)   Doylene Canning, [place] [facility] Coordinator [phone]
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
## 2021-05-02 NOTE — ED Notes (Signed)
Patient has been calm, well mannered, and compliant during the shift so far.

## 2021-05-02 NOTE — Progress Notes (Signed)
TOC CSW was not consulted on this pt, pt is still under Psych Hold however , CSW received a call from DSS CPS SW Kallie Locks [phone]), wanting to speak with someone in regards to pt. CSW referred DSS worker to [employer] TTS.     Valentina Shaggy.Christan Ciccarelli, MSW, LCSWA [facility]  Transitions of Care Clinical Social Worker I Direct Dial: [phone]  Fax: [phone] [email or web address]

## 2021-05-02 NOTE — ED Provider Notes (Signed)
Emergency Medicine Observation Re-evaluation Note  Melissa [name] is a 25 y.o. female, seen on rounds today.  Pt initially presented to the ED for complaints of Suicidal Currently, the patient is under IVC.  Came via PD.  Has history of schizophrenia.  Had thoughts of hurting herself and her baby.  Was being aggressive and throwing things in the NICU.  Physical Exam  BP 119/81   Pulse 65   Temp 98.2 F (36.8 C) (Oral)   Resp 17   Ht 5\' 7"  (1.702 m)   Wt 68 kg   LMP 07/27/2020   SpO2 100%   BMI 23.49 kg/m  Physical Exam General: Awake, alert, no distress Cardiac: Normal rate and rhythm Lungs: Wheezing is even and unlabored Psych: No agitation, does not appear to be responding to internal stimuli.  ED Course / MDM  EKG:EKG Interpretation  Date/Time:  Tuesday May 02 2021 00:20:19 EDT Ventricular Rate:  90 PR Interval:  143 QRS Duration: 82 QT Interval:  335 QTC Calculation: 410 R Axis:   71 Text Interpretation: Sinus rhythm ST elev, probable normal early repol pattern Confirmed by 11-15-1997 (Paula Libra) on 05/02/2021 12:30:14 AM  I have reviewed the labs performed to date as well as medications administered while in observation.  Recent changes in the last 24 hours include patient was IVC'd for psychosis.  Awaiting inpatient psychiatric admission.  Plan  Current plan is for inpatient psychiatric admission. Melissa [name] is under involuntary commitment.      [name], MD 05/02/21 [phone]

## 2021-05-02 NOTE — Progress Notes (Addendum)
CSW received a phone call from Raven with [facility] [phone] reporting that pt is accepted to [facility]. Per Nira Conn, NP pt meets inpatient Behavioral Health criteria. Accepting physician is Dr. Forrestine Him. Phone number for report is [phone], and pt can arrive tomorrow 05/03/21 after 0900am.   CSW communicated via secure chat to update nursing staff about pt's bed offer. CSW informed: Freddie Apley, RN, and Gaetano Net, RN via. CSW also requested that nursing coordinate transportation.   Maryjean Ka, MSW, [facility] 05/02/2021 5:34 PM

## 2021-05-02 NOTE — ED Notes (Signed)
Pt dressed into burgundy scrubs. Personal belongings collected and placed in labeled pt belongings bag; including phone, keys, earrings, socks, shoes, dress. Bag placed in pt belongings cabinet in triage. RN advised. [employer]

## 2021-05-03 MED ORDER — STERILE WATER FOR INJECTION IJ SOLN
INTRAMUSCULAR | Status: AC
  Administered 2021-05-03: 1.2 mL
  Filled 2021-05-03: qty 10

## 2021-05-03 NOTE — ED Notes (Signed)
Patient is yelling and trying to leave. Patient states that she is outpatient and we told her she is ivc'd. Patient continues to say people are being disrespectful.

## 2021-05-03 NOTE — BH Assessment (Signed)
BHH Assessment Progress Note   At 08:21 this writer called Kallie Locks with [facility] CPS to notify him that pt has been accepted to [name] and that she is to be transported his morning.  Call rolled to voice mail and I left a message.  I included the facility's phone number.  I also informed him that, in pt's referral packet yesterday, I had included a note informing them that pt is currently involved with CPS.  I have not received a return call as of this writing.  Doylene Canning, [place] [facility]al [facility] Coordinator [phone]

## 2021-05-03 NOTE — ED Notes (Signed)
Sheriff's department called to arrange transport to [facility]-plan is to transport after 9am

## 2021-05-10 ENCOUNTER — Telehealth (HOSPITAL_COMMUNITY): Payer: Self-pay | Admitting: *Deleted

## 2021-05-10 NOTE — Telephone Encounter (Signed)
Attempted Hospital Discharge Follow-Up Call.  Left voice mail requesting that patient return RN's phone call.  

## 2021-05-17 NOTE — Telephone Encounter (Signed)
error 

## 2021-05-30 ENCOUNTER — Inpatient Hospital Stay (HOSPITAL_COMMUNITY)
Admission: AD | Admit: 2021-05-30 | Discharge: 2021-05-30 | Disposition: A | Discharge status: Home / Self Care | Payer: Medicaid Other | Attending: Obstetrics and Gynecology | Admitting: Obstetrics and Gynecology

## 2021-05-30 ENCOUNTER — Other Ambulatory Visit: Payer: Self-pay

## 2021-05-30 ENCOUNTER — Encounter (HOSPITAL_COMMUNITY): Payer: Self-pay | Admitting: *Deleted

## 2021-05-30 DIAGNOSIS — M7918 Myalgia, other site: Secondary | ICD-10-CM | POA: Insufficient documentation

## 2021-05-30 DIAGNOSIS — Z3202 Encounter for pregnancy test, result negative: Secondary | ICD-10-CM | POA: Insufficient documentation

## 2021-05-30 DIAGNOSIS — M25552 Pain in left hip: Secondary | ICD-10-CM | POA: Diagnosis not present

## 2021-05-30 DIAGNOSIS — M25551 Pain in right hip: Secondary | ICD-10-CM | POA: Insufficient documentation

## 2021-05-30 DIAGNOSIS — N939 Abnormal uterine and vaginal bleeding, unspecified: Secondary | ICD-10-CM | POA: Diagnosis present

## 2021-05-30 LAB — URINALYSIS, ROUTINE W REFLEX MICROSCOPIC
Bilirubin Urine: NEGATIVE
Glucose, UA: NEGATIVE mg/dL
Hgb urine dipstick: NEGATIVE
Ketones, ur: NEGATIVE mg/dL
Leukocytes,Ua: NEGATIVE
Nitrite: NEGATIVE
Protein, ur: NEGATIVE mg/dL
Specific Gravity, Urine: 1.018 (ref 1.005–1.030)
pH: 6 (ref 5.0–8.0)

## 2021-05-30 LAB — POCT PREGNANCY, URINE: Preg Test, Ur: NEGATIVE

## 2021-05-30 NOTE — MAU Provider Note (Signed)
Event Date/Time   First Provider Initiated Contact with Patient 05/30/21 1831      S Ms. Melissa [name] is a 25 y.o. G2P1011 postpartum patient. She is s/p VAVD 04/25/2021. She presents to MAU today with complaints of vaginal spotting and bilateral hip pain.  These are new problems, onset two days ago. She prefers to avoid medication and so has not tried medication for her pain. Patient states she believes she is about to have her first menstrual cycle. She voices concern for pregnancy as she had one unprotected sexual encounter since she gave birth. She has not taken a HPT.   O BP 110/71 (BP Location: Right Arm)   Pulse 90   Temp 98.3 F (36.8 C) (Oral)   Resp 19   Ht 5\' 7"  (1.702 m)   Wt 76.9 kg   SpO2 100%   Breastfeeding Unknown   BMI 26.55 kg/m    Physical Exam Vitals and nursing note reviewed.  Constitutional:      Appearance: Normal appearance. She is not ill-appearing.  Cardiovascular:     Rate and Rhythm: Normal rate.     Pulses: Normal pulses.  Pulmonary:     Effort: Pulmonary effort is normal.  Abdominal:     General: Abdomen is flat.  Skin:    Capillary Refill: Capillary refill takes less than 2 seconds.  Neurological:     Mental Status: She is alert and oriented to person, place, and time.  Psychiatric:        Mood and Affect: Mood normal.        Behavior: Behavior normal.        Thought Content: Thought content normal.        Judgment: Judgment normal.    A Medical screening exam complete Negative urine pregnancy test in MAU Advised Tylenol and Motrin for musculoskeletal pain  P Discharge from MAU in stable condition Patient may return to MAU as needed   F/U Patient has postpartum visit at [facility] on 06/07/2021  08/07/2021, CNM 05/30/2021 7:00 PM

## 2021-05-30 NOTE — Progress Notes (Signed)
MSE completed by S. Reita Cliche, CNM

## 2021-05-30 NOTE — MAU Note (Addendum)
Presents stating she's spotting wiping, reports spotting is dark brown in color.  Hasn't has a menstrual cycle since delivery.  Also states having bilateral hip pain that began 2 days ago.  Reports hasn't taken any meds to treat hip pain. Reports NVD on 04/25/2021.

## 2021-06-15 ENCOUNTER — Ambulatory Visit: Payer: Medicaid Other | Admitting: Family

## 2021-10-10 ENCOUNTER — Inpatient Hospital Stay (HOSPITAL_COMMUNITY)
Admission: AD | Admit: 2021-10-10 | Discharge: 2021-10-10 | Disposition: A | Discharge status: Home / Self Care | Payer: Medicaid Other | Attending: Obstetrics & Gynecology | Admitting: Obstetrics & Gynecology

## 2021-10-10 ENCOUNTER — Encounter (HOSPITAL_COMMUNITY): Payer: Self-pay | Admitting: Obstetrics & Gynecology

## 2021-10-10 ENCOUNTER — Other Ambulatory Visit: Payer: Self-pay

## 2021-10-10 DIAGNOSIS — Z3202 Encounter for pregnancy test, result negative: Secondary | ICD-10-CM

## 2021-10-10 LAB — POCT PREGNANCY, URINE: Preg Test, Ur: NEGATIVE

## 2021-10-10 NOTE — MAU Note (Signed)
...  Melissa [name] is a 26 y.o. at Unknown here in MAU reporting: Strong smell in her urine which makes her think she is pregnant again. She states whenever she is pregnant it has this same smell. She denies any pain, VB, or abnormal discharge. She endorses being sexually active.   LMP: 09/18/2021  Pain score: Denies pain  Lab orders placed from triage:  POCT Pregnancy

## 2021-10-10 NOTE — MAU Provider Note (Signed)
None     S Ms. ALYENE PREDMORE is a 26 y.o. G2P1011 patient who presents to MAU today with complaint of questionable pregnancy and malodor urine.   O BP 125/79 (BP Location: Right Arm)    Pulse 90    Temp 98.4 F (36.9 C) (Oral)    Resp 16    Ht 5\' 7"  (1.702 m)    Wt 87.7 kg    SpO2 100%    BMI 30.28 kg/m  Physical Exam Vitals reviewed.  Constitutional:      Appearance: Normal appearance.  HENT:     Head: Normocephalic and atraumatic.  Eyes:     Conjunctiva/sclera: Conjunctivae normal.  Cardiovascular:     Rate and Rhythm: Normal rate.  Musculoskeletal:        General: Normal range of motion.     Cervical back: Normal range of motion.  Skin:    General: Skin is warm and dry.  Neurological:     Mental Status: She is alert and oriented to person, place, and time.  Psychiatric:        Mood and Affect: Mood normal.        Behavior: Behavior normal.        Thought Content: Thought content normal.    A Medical screening exam complete Negative UPT  P Patient informed that UPT negative. Thanks provider and proceeds to leave triage room! Discharge from MAU in stable condition   , Gerrit Heck 10/10/2021 10:53 AM

## 2021-10-15 ENCOUNTER — Encounter (HOSPITAL_COMMUNITY): Payer: Self-pay | Admitting: Emergency Medicine

## 2021-10-15 ENCOUNTER — Emergency Department (HOSPITAL_COMMUNITY)
Admission: EM | Admit: 2021-10-15 | Discharge: 2021-10-15 | Disposition: A | Discharge status: Home / Self Care | Payer: Medicaid Other | Attending: Emergency Medicine | Admitting: Emergency Medicine

## 2021-10-15 ENCOUNTER — Other Ambulatory Visit: Payer: Self-pay

## 2021-10-15 DIAGNOSIS — N898 Other specified noninflammatory disorders of vagina: Secondary | ICD-10-CM

## 2021-10-15 DIAGNOSIS — J45909 Unspecified asthma, uncomplicated: Secondary | ICD-10-CM | POA: Insufficient documentation

## 2021-10-15 DIAGNOSIS — N939 Abnormal uterine and vaginal bleeding, unspecified: Secondary | ICD-10-CM | POA: Insufficient documentation

## 2021-10-15 LAB — COMPREHENSIVE METABOLIC PANEL
ALT: 15 U/L (ref 0–44)
AST: 18 U/L (ref 15–41)
Albumin: 3.7 g/dL (ref 3.5–5.0)
Alkaline Phosphatase: 66 U/L (ref 38–126)
Anion gap: 9 (ref 5–15)
BUN: 17 mg/dL (ref 6–20)
CO2: 24 mmol/L (ref 22–32)
Calcium: 9.4 mg/dL (ref 8.9–10.3)
Chloride: 106 mmol/L (ref 98–111)
Creatinine, Ser: 0.96 mg/dL (ref 0.44–1.00)
GFR, Estimated: >60 mL/min (ref >60)
Glucose, Bld: 99 mg/dL (ref 70–99)
Potassium: 4.1 mmol/L (ref 3.5–5.1)
Sodium: 139 mmol/L (ref 135–145)
Total Bilirubin: 0.4 mg/dL (ref 0.3–1.2)
Total Protein: 7.2 g/dL (ref 6.5–8.1)

## 2021-10-15 LAB — CBC
HCT: 39.2 % (ref 36.0–46.0)
Hemoglobin: 12.8 g/dL (ref 12.0–15.0)
MCH: 28.7 pg (ref 26.0–34.0)
MCHC: 32.7 g/dL (ref 30.0–36.0)
MCV: 87.9 fL (ref 80.0–100.0)
Platelets: 320 10*3/uL (ref 150–400)
RBC: 4.46 MIL/uL (ref 3.87–5.11)
RDW: 13.3 % (ref 11.5–15.5)
WBC: 8.3 10*3/uL (ref 4.0–10.5)
nRBC: 0 % (ref 0.0–0.2)

## 2021-10-15 LAB — LIPASE, BLOOD: Lipase: 47 U/L (ref 11–51)

## 2021-10-15 LAB — URINALYSIS, MICROSCOPIC (REFLEX): RBC / HPF: >50 RBC/hpf (ref 0–5)

## 2021-10-15 LAB — WET PREP, GENITAL
Clue Cells Wet Prep HPF POC: NONE SEEN
Sperm: NONE SEEN
Trich, Wet Prep: NONE SEEN
WBC, Wet Prep HPF POC: <10 (ref <10)
Yeast Wet Prep HPF POC: NONE SEEN

## 2021-10-15 LAB — I-STAT BETA HCG BLOOD, ED (MC, WL, AP ONLY): I-stat hCG, quantitative: <5 m[IU]/mL (ref <5)

## 2021-10-15 LAB — URINALYSIS, ROUTINE W REFLEX MICROSCOPIC
Bilirubin Urine: NEGATIVE
Glucose, UA: NEGATIVE mg/dL
Ketones, ur: NEGATIVE mg/dL
Leukocytes,Ua: NEGATIVE
Nitrite: NEGATIVE
Protein, ur: NEGATIVE mg/dL
Specific Gravity, Urine: 1.025 (ref 1.005–1.030)
pH: 5.5 (ref 5.0–8.0)

## 2021-10-15 NOTE — ED Triage Notes (Signed)
Pt reports white vaginal discharge "that looks like chicken" and lower abd cramping since yesterday.

## 2021-10-15 NOTE — Discharge Instructions (Signed)
Your wet prep was unremarkable for BV or yeast infection Your gonorrhea/chlamydia swab is still pending, we recommend following up on MyChart to be the result of the swab.  If positive, we recommend presenting to PCP or urgent care to obtain treatment. All partner should be tested for gonorrhea/chlamydia and treated. Please follow-up with your primary care doctor in 2 days for post ED follow-up visit.

## 2021-10-15 NOTE — ED Provider Notes (Signed)
[facility] EMERGENCY DEPARTMENT Provider Note  History   Chief Complaint  Patient presents with   Vaginal Discharge   Abdominal Cramping   The history is provided by the patient.  Vaginal Discharge Quality:  Bloody and white Severity:  Mild Onset quality:  Sudden Duration:  1 day Timing:  Intermittent Progression:  Unchanged Chronicity:  New Context: at rest and recent antibiotic use   Relieved by:  Nothing Worsened by:  Nothing Ineffective treatments:  None tried Associated symptoms: abdominal pain (abd cramping w/ menstrual cycle)   Associated symptoms: no dysuria, no fever, no nausea and no vomiting   Risk factors: unprotected sex   Risk factors: no endometriosis, no foreign body, no gynecological surgery, no immunosuppression and no prior miscarriage   Abdominal Cramping Associated symptoms include abdominal pain (abd cramping w/ menstrual cycle). Pertinent negatives include no chest pain, no headaches and no shortness of breath.    Past Medical History:  Diagnosis Date   Anxiety    Asthma    Bipolar 1 disorder ([place])    Chlamydia    Collapsed lung 06/11/2020   Depression    Dizziness    Gonorrhea    PTSD (post-traumatic stress disorder)    Rib fracture    Syncope     Social History   Tobacco Use   Smoking status: Never   Smokeless tobacco: Never  Vaping Use   Vaping Use: Never used  Substance Use Topics   Alcohol use: Not Currently   Drug use: Not Currently    Types: Marijuana, Cocaine    Comment: 7 years     Family History  Problem Relation Age of Onset   Asthma Mother    Hypertension Father     Review of Systems  Constitutional:  Negative for chills and fever.  HENT:  Negative for congestion and sore throat.   Eyes:  Negative for photophobia and visual disturbance.  Respiratory:  Negative for cough and shortness of breath.   Cardiovascular:  Negative for chest pain and leg swelling.  Gastrointestinal:  Positive for  abdominal pain (abd cramping w/ menstrual cycle). Negative for diarrhea, nausea and vomiting.  Endocrine: Negative.   Genitourinary:  Positive for vaginal discharge. Negative for decreased urine volume, dysuria and flank pain.  Musculoskeletal:  Negative for neck pain and neck stiffness.  Skin:  Negative for rash and wound.  Allergic/Immunologic: Negative.   Neurological:  Negative for speech difficulty and headaches.  Hematological: Negative.   Psychiatric/Behavioral: Negative.      Physical Exam   Today's Vitals   10/15/21 1600 10/15/21 1615 10/15/21 1715 10/15/21 1730  BP: 109/72 100/70 114/76   Pulse: 82     Resp: 18     Temp:      TempSrc:      SpO2: 99%     PainSc:    0-No pain     Physical Exam Vitals and nursing note reviewed. Exam conducted with a chaperone present.  Constitutional:      General: She is not in acute distress.    Appearance: She is well-developed. She is obese. She is not ill-appearing or toxic-appearing.  HENT:     Head: Normocephalic and atraumatic.     Nose: Nose normal. No congestion.     Mouth/Throat:     Mouth: Mucous membranes are moist.     Pharynx: Oropharynx is clear. No oropharyngeal exudate.  Eyes:     Extraocular Movements: Extraocular movements intact.     Pupils: Pupils  are equal, round, and reactive to light.  Cardiovascular:     Rate and Rhythm: Normal rate and regular rhythm.     Pulses: Normal pulses.     Heart sounds: Normal heart sounds. No murmur heard. Pulmonary:     Effort: Pulmonary effort is normal. No respiratory distress.     Breath sounds: Normal breath sounds. No stridor. No wheezing, rhonchi or rales.  Abdominal:     General: There is no distension.     Palpations: Abdomen is soft.     Tenderness: There is no abdominal tenderness. There is no right CVA tenderness, left CVA tenderness, guarding or rebound.     Hernia: No hernia is present. There is no hernia in the left inguinal area or right inguinal area.   Genitourinary:    Exam position: Lithotomy position.     Labia:        Right: No rash or lesion.        Left: No rash or lesion.      Vagina: No signs of injury and foreign body. Bleeding ((On her menstrual cycle)) present. No vaginal discharge or tenderness.     Cervix: Cervical bleeding present. No cervical motion tenderness, discharge, friability or erythema.     Uterus: Normal.      Adnexa: Right adnexa normal and left adnexa normal.       Right: No tenderness.         Left: No tenderness.    Musculoskeletal:        General: No swelling.     Cervical back: Normal range of motion and neck supple. No tenderness.     Right lower leg: No edema.     Left lower leg: No edema.  Skin:    General: Skin is warm and dry.     Capillary Refill: Capillary refill takes less than 2 seconds.     Findings: No rash.  Neurological:     General: No focal deficit present.     Mental Status: She is alert and oriented to person, place, and time. Mental status is at baseline.     Cranial Nerves: No cranial nerve deficit.     Sensory: No sensory deficit.     Motor: No weakness.     Coordination: Coordination normal.  Psychiatric:        Mood and Affect: Mood normal.        Behavior: Behavior normal.    ED Course  Procedures  Medical Decision Making:  Melissa [name] is a 26 y.o. female w/ h/o asthma, anxiety, bipolar, depression, h/o gonorrhea/chlamydia who p/w vaginal discharge.   Patient states she gave birth 6 months ago She endorses 1 day of vaginal discharge (while on her menstrual cycle) that "looks like chicken."  She states she presented to ED to make sure she is "not having a miscarriage."  She denies preceding positive pregnancy test.  She endorses being sexually active with 1 partner, she states that her female partner is not monogamous.  She denies any fever or systemic symptoms.  She denies abdominal pain (other than menstrual cramping).  She denies GI losses.  She denies vaginal  itching or pain.  Denies dysuria or hematuria.  She was seen 5 days ago by OB/GYN with concern for possible pregnancy.  Her pregnancy test at this time is negative.  Of note, the patient reports she "just got off of antibiotics for BV."  On chaperone pelvic examination, there is blood in her vaginal vault but  no appreciable vaginal discharge or injury.  No cervical motion tenderness, discharge, friability, or erythema.  We will obtain basic labs, beta-hCG, UA, wet prep, GC/chlamydia.  If wet prep negative, consider prophylaxis for GC/chlamydia.  ER provider interpretation of Imaging / Radiology:  Not indicated  ER provider interpretation of EKG:  Not indicated  ER provider interpretation of Labs:  CBC: No leukocytosis or acute anemia CMP: No electrolyte abnormality or AKI Lipase: Unremarkable I-STAT beta-hCG: Not pregnant UA with large blood, no UTI (negative nitrite, negative LE, negative WBC) Wet prep: Negative GC/chlamydia: Pending  Key medications administered in the ER:  Medications - No data to display  Diagnoses considered: Etiology likely menstrual cycle.  Wet prep negative for BV or yeast.  UA without evidence of UTI.  Beta hCG negative, and in the context of negative UPT 6 days ago -unlikely miscarriage.  GC/chlamydia pending, patient declined prophylaxis.  No evidence of PID.  Consulted: Not indicated  Spoke with patient in the setting of unremarkable results, offered prophylaxis for GC/chlamydia.  Patient states she would rather not be on antibiotics.  Risk/benefit discussion had at bedside with patient regarding long-term effects of possible untreated infection (if present), patient voices understanding and still would elect to refuse these medications.  Spoke with patient and told her that all partners should be tested.  Patient agreeable to follow-up results of GC/chlamydia swab and treat if positive.  Key discharge instructions: Spoke to patient at bedside, all  questions were answered at this time, close return precautions given, and patient voiced understanding and agreement with plan. Patient discharged in stable condition.   Patient seen in conjunction with Dr. Nicola Girt medical dictation software was used in the creation of this note.   Electronically signed by: Wynetta Fines, MD on 10/16/2021 at 2:19 PM  Clinical Impression:  1. Vaginal discharge     Dispo: Discharge    Wynetta Fines, MD 10/17/21 1332    Lajean Saver, MD 10/19/21 [phone]

## 2021-10-16 LAB — GC/CHLAMYDIA PROBE AMP (~~LOC~~) NOT AT ARMC
Chlamydia: NEGATIVE
Comment: NEGATIVE
Comment: NORMAL
Neisseria Gonorrhea: NEGATIVE

## 2022-02-07 ENCOUNTER — Ambulatory Visit: Payer: Medicaid Other | Admitting: Student

## 2022-03-22 ENCOUNTER — Inpatient Hospital Stay (HOSPITAL_COMMUNITY): Payer: Medicaid Other

## 2022-03-22 ENCOUNTER — Other Ambulatory Visit: Payer: Self-pay

## 2022-03-22 ENCOUNTER — Encounter (HOSPITAL_COMMUNITY): Payer: Self-pay | Admitting: Obstetrics and Gynecology

## 2022-03-22 ENCOUNTER — Inpatient Hospital Stay (HOSPITAL_COMMUNITY)
Admission: AD | Admit: 2022-03-22 | Discharge: 2022-03-22 | Disposition: A | Discharge status: Home / Self Care | Payer: Medicaid Other | Attending: Obstetrics and Gynecology | Admitting: Obstetrics and Gynecology

## 2022-03-22 DIAGNOSIS — Z3A01 Less than 8 weeks gestation of pregnancy: Secondary | ICD-10-CM

## 2022-03-22 DIAGNOSIS — O020 Blighted ovum and nonhydatidiform mole: Secondary | ICD-10-CM

## 2022-03-22 DIAGNOSIS — Z3201 Encounter for pregnancy test, result positive: Secondary | ICD-10-CM | POA: Insufficient documentation

## 2022-03-22 DIAGNOSIS — O26891 Other specified pregnancy related conditions, first trimester: Secondary | ICD-10-CM | POA: Insufficient documentation

## 2022-03-22 DIAGNOSIS — O209 Hemorrhage in early pregnancy, unspecified: Secondary | ICD-10-CM

## 2022-03-22 DIAGNOSIS — O208 Other hemorrhage in early pregnancy: Secondary | ICD-10-CM | POA: Diagnosis not present

## 2022-03-22 LAB — WET PREP, GENITAL
Clue Cells Wet Prep HPF POC: NONE SEEN
Sperm: NONE SEEN
Trich, Wet Prep: NONE SEEN
WBC, Wet Prep HPF POC: <10 (ref <10)
Yeast Wet Prep HPF POC: NONE SEEN

## 2022-03-22 LAB — CBC
HCT: 34.6 % — ABNORMAL LOW (ref 36.0–46.0)
Hemoglobin: 11.5 g/dL — ABNORMAL LOW (ref 12.0–15.0)
MCH: 28.5 pg (ref 26.0–34.0)
MCHC: 33.2 g/dL (ref 30.0–36.0)
MCV: 85.9 fL (ref 80.0–100.0)
Platelets: 283 10*3/uL (ref 150–400)
RBC: 4.03 MIL/uL (ref 3.87–5.11)
RDW: 13.9 % (ref 11.5–15.5)
WBC: 7.8 10*3/uL (ref 4.0–10.5)
nRBC: 0 % (ref 0.0–0.2)

## 2022-03-22 LAB — URINALYSIS, ROUTINE W REFLEX MICROSCOPIC
Bilirubin Urine: NEGATIVE
Glucose, UA: NEGATIVE mg/dL
Ketones, ur: NEGATIVE mg/dL
Nitrite: NEGATIVE
Protein, ur: NEGATIVE mg/dL
Specific Gravity, Urine: 1.012 (ref 1.005–1.030)
WBC, UA: >50 WBC/hpf — ABNORMAL HIGH (ref 0–5)
pH: 5 (ref 5.0–8.0)

## 2022-03-22 LAB — HCG, QUANTITATIVE, PREGNANCY: hCG, Beta Chain, Quant, S: 34779 m[IU]/mL — ABNORMAL HIGH (ref <5)

## 2022-03-22 LAB — POC URINE PREG, ED: Preg Test, Ur: POSITIVE — AB

## 2022-03-22 NOTE — ED Triage Notes (Signed)
Pt via [facility] for c/o vaginal bleeding/abd pain. Pt advises she missed a cycle, took pregnancy test "a few weeks ago & it was positive." Bleeding noted w last pregnancy, no other complaints. Concern for baby/pregnancy, hx miscarriage

## 2022-03-22 NOTE — MAU Provider Note (Signed)
History     CSN: [id number]  Arrival date and time: 03/22/22 1620   Chief Complaint  Patient presents with   Abdominal Pain   Vaginal Bleeding   HPI 26 yo G2P1011 at [redacted]w[redacted]d by LMP who presents with one day of vaginal spotting. Started at 0500 today. Starting as light color - orangish and now pink. Not needing pad per patient.  No pain at this time. No lightheadedness or dizziness    OB History     Gravida  3   Para  1   Term  1   Preterm  0   AB  1   Living  1      SAB  1   IAB  0   Ectopic  0   Multiple  0   Live Births  1           Past Medical History:  Diagnosis Date   Anxiety    Asthma    Bipolar 1 disorder ([facility])    Chlamydia    Collapsed lung 06/11/2020   Depression    Dizziness    Gonorrhea    PTSD (post-traumatic stress disorder)    Rib fracture    Syncope     Past Surgical History:  Procedure Laterality Date   chest tube  06/11/2020    Family History  Problem Relation Age of Onset   Asthma Mother    Hypertension Father     Social History   Tobacco Use   Smoking status: Never   Smokeless tobacco: Never  Vaping Use   Vaping Use: Never used  Substance Use Topics   Alcohol use: Not Currently   Drug use: Not Currently    Types: Marijuana, Cocaine    Comment: 7 years    Allergies: No Known Allergies  Medications Prior to Admission  Medication Sig Dispense Refill Last Dose   Prenatal Vit-Fe Fumarate-FA (PRENATAL MULTIVITAMIN) TABS tablet Take 1 tablet by mouth daily at 12 noon.   03/22/2022   naproxen sodium (ALEVE) 220 MG tablet Take 220-440 mg by mouth daily as needed (pain).       Review of Systems  Constitutional:  Negative for chills and fever.  Respiratory:  Negative for shortness of breath.   Cardiovascular:  Negative for chest pain.  Gastrointestinal:  Negative for abdominal pain, diarrhea, nausea and vomiting.  Endocrine: Negative for polyuria.  Genitourinary:  Negative for dysuria.  Musculoskeletal:   Negative for back pain.  Skin:  Negative for rash.   Physical Exam   Blood pressure 96/62, pulse 81, temperature 98.3 F (36.8 C), temperature source Oral, resp. rate 15, height 5\' 8"  (1.727 m), weight 94.6 kg, last menstrual period 02/09/2022, SpO2 100 %, unknown if currently breastfeeding.  Physical Exam Vitals and nursing note reviewed. Exam conducted with a chaperone present.  HENT:     Head: Normocephalic.  Cardiovascular:     Rate and Rhythm: Normal rate.     Heart sounds: Normal heart sounds.  Pulmonary:     Effort: Pulmonary effort is normal.  Abdominal:     Tenderness: There is no abdominal tenderness.  Genitourinary:    Comments: No blood or discharge on speculum exam.Swabs collected. On visualization - cervix appears closed Skin:    General: Skin is warm.     Capillary Refill: Capillary refill takes less than 2 seconds.  Neurological:     General: No focal deficit present.     Mental Status: She is alert and oriented to person,  place, and time.  Psychiatric:        Mood and Affect: Mood normal.     MAU Course  Procedures US IMPRESSION: Single intrauterine gestation with visible gestational sac and yolk sac but no embryo. Consider sonographic follow-up in 10-14 days to confirm viability. Small subchorionic hemorrhage      MDM 26 yo G2P1011 at [redacted]w[redacted]d by LMP who presents with one day of vaginal spotting. Started at 0500 today. Starting as light color - orangish and now pink. Previously with some cramping. Not needing pad per patient. No prior US in current pregnancy. On exam no blood in vault. Will get HCG and [place] to rule out ectopic/evaluate status and dating of pregnancy. Also CBC and ABO/Rh in case needs blood work or  procedure.   [place]  intrauterine gestation with GS and YS but no embyro. DDX possibly early pregnancy vs. Possible threatened miscarriage. Also with subchorionic hemorrhage. Will plan for serial hcg and follow up viability scan in 10-14 weeks  Swabs  sent for culture as well. No evidence of yeast or BV.   Assessment and Plan    Vaginal spotting and abdominal cramping [place] with intrauterine gestation with GS and YS but no embyro. DDX possibly early pregnancy vs. Possible threatened miscarriage. Also with subchorionic hemorrhage. Blood type B+. HCG 34,779. - plan for repeat hcg in 48 hours in MAU - plan for viability [place] in 10-14 days - scheduled at [facility] - discussed return precautions in detail as well as precautions for subchorionic hemorrhage - stable and ok to DC home  2. [redacted] weeks gestation  Warner Mccreedy, MD, MPH OB Fellow, Faculty Practice

## 2022-03-22 NOTE — ED Provider Triage Note (Signed)
Emergency Medicine Provider OB Triage Evaluation Note  Melissa [name] is a 26 y.o. female, G2P1011, at Unknown gestation who presents to the emergency department with complaints of vaginal bleeding x 1 day.  Ports there is bright red blood every time she wipes, similar symptoms in the past with prior pregnancies.  She is also having some lower abdominal cramping that just began.  Not using pads at this time.  Have a positive test at home, has not had any prenatal appointment.  Review of  Systems  Positive: abdominal pain, vaginal bleeding Negative: nausea, vomiting  Physical Exam  There were no vitals taken for this visit. General: Awake, no distress  HEENT: Atraumatic  Resp: Normal effort  Cardiac: Normal rate Abd: Nondistended, nontender  MSK: Moves all extremities without difficulty Neuro: Speech clear  Medical Decision Making  Pt evaluated for pregnancy concern and is stable for transfer to MAU. Pt is in agreement with plan for transfer.  4:23 PM Discussed with MAU APP, Shawna Orleans, who accepts patient in transfer.  Clinical Impression  No diagnosis found.  Given report to MAU APP, pregnancy test is positive in the ED.  Hemodynamically stable for transfer.       Claude Manges, PA-C 03/22/22 1642

## 2022-03-22 NOTE — Discharge Instructions (Signed)
You came to the MAU because you had bleeding. We did labs which were normal. We also did an Ultrasound which showed a sac in the uterus but we could not see a fetus (which could be because it is too early). We will have you come back for another pregnancy hormone level in 2 days and then again in 4 days. We will also do another ultrasound in 10 days in the office

## 2022-03-22 NOTE — MAU Note (Signed)
...  Melissa [name] is a 26 y.o. at approximately here in MAU reporting: Vaginal bleeding that began this morning around 0500 after having intercourse at that time. She reports initially it was "a light color like almost orange" and around 1400 it turned into a light pink color. Not wearing a pad. Only sees it when she wipes. Last IC this morning. Denies vaginal discharge, vaginal itching, and vaginal odors.  LMP: 02/09/2022 Onset of complaint: This morning Pain score: Denies pain.  Vitals:   03/22/22 1626 03/22/22 1714  BP: 110/74 105/67  Pulse: 86 81  Resp: 16 15  Temp: 98.4 F (36.9 C) 98.3 F (36.8 C)  SpO2: 100% 100%      Lab orders placed from triage: UA

## 2022-03-23 LAB — GC/CHLAMYDIA PROBE AMP (~~LOC~~) NOT AT ARMC
Chlamydia: NEGATIVE
Comment: NEGATIVE
Comment: NORMAL
Neisseria Gonorrhea: NEGATIVE

## 2022-03-23 LAB — ABO/RH: ABO/RH(D): B POS

## 2022-03-24 ENCOUNTER — Inpatient Hospital Stay (HOSPITAL_COMMUNITY): Payer: Medicaid Other | Attending: Family Medicine

## 2022-03-24 ENCOUNTER — Telehealth: Payer: Self-pay | Admitting: Family Medicine

## 2022-03-24 LAB — CULTURE, OB URINE: Culture: >=100000 — AB

## 2022-03-24 MED ORDER — CEFADROXIL 500 MG PO CAPS: 500.0000 mg | ORAL_CAPSULE | Freq: Two times a day (BID) | ORAL | 0 refills | Status: AC

## 2022-03-24 NOTE — Telephone Encounter (Signed)
Called patient to discuss urine culture results. Discussed that she has a UTI and sent Duricef to pharmacy of choice. Patient expressed understanding and confirmed that she doesn't have any allergies.  During the call she also informed me that she does not have transportation to come to the MAU for repeat HCG today. We discussed the reasoning behind 48 hour lab and patient expressed understanding. She states she will try to find transportation but if not could she get labs in office on Monday.  We scheduled her for HCG at [facility] on Mon. We also discussed that her viability [place] on 8/8 is even more helpful in determining the status of her pregnancy. Patient expressed understanding.  Warner Mccreedy, MD, MPH OB Fellow, Faculty Practice

## 2022-03-26 ENCOUNTER — Ambulatory Visit: Payer: Medicaid Other

## 2022-04-02 ENCOUNTER — Other Ambulatory Visit: Payer: Self-pay | Admitting: General Practice

## 2022-04-02 DIAGNOSIS — O3680X0 Pregnancy with inconclusive fetal viability, not applicable or unspecified: Secondary | ICD-10-CM

## 2022-04-03 ENCOUNTER — Ambulatory Visit (INDEPENDENT_AMBULATORY_CARE_PROVIDER_SITE_OTHER): Payer: Medicaid Other

## 2022-04-03 DIAGNOSIS — O3680X0 Pregnancy with inconclusive fetal viability, not applicable or unspecified: Secondary | ICD-10-CM | POA: Diagnosis not present

## 2022-04-19 ENCOUNTER — Encounter (INDEPENDENT_AMBULATORY_CARE_PROVIDER_SITE_OTHER): Payer: Self-pay | Admitting: Primary Care

## 2022-04-19 ENCOUNTER — Ambulatory Visit (INDEPENDENT_AMBULATORY_CARE_PROVIDER_SITE_OTHER): Payer: Medicaid Other | Admitting: Primary Care

## 2022-04-19 VITALS — BP 110/73 | HR 93 | Temp 98.0°F | Ht 68.0 in | Wt 199.8 lb

## 2022-04-19 DIAGNOSIS — Z3A10 10 weeks gestation of pregnancy: Secondary | ICD-10-CM | POA: Diagnosis not present

## 2022-04-19 DIAGNOSIS — R112 Nausea with vomiting, unspecified: Secondary | ICD-10-CM

## 2022-04-19 DIAGNOSIS — Z7689 Persons encountering health services in other specified circumstances: Secondary | ICD-10-CM | POA: Diagnosis not present

## 2022-04-19 MED ORDER — DIMENHYDRINATE 50 MG PO TABS: 25.0000 mg | ORAL_TABLET | Freq: Three times a day (TID) | ORAL | 0 refills | Status: DC | PRN

## 2022-04-19 NOTE — Patient Instructions (Signed)
Eating Plan for Pregnant Women While you are pregnant, your body requires additional nutrition to help support your growing baby. You also have a higher need for some vitamins and minerals, such as folic acid, calcium, iron, and vitamin D. Eating a healthy, well-balanced diet is very important for your health and your baby's health. Your need for extra calories varies over the course of your pregnancy. Pregnancy is divided into three trimesters, with each trimester lasting 3 months. For most women, it is recommended to consume: 150 extra calories a day during the first trimester. 300 extra calories a day during the second trimester. 300 extra calories a day during the third trimester. What are tips for following this plan? Cooking Practice good food safety and cleanliness. Wash your hands before you eat and after you prepare raw meat. Wash all fruits and vegetables well before peeling or eating. Taking these actions can help to prevent foodborne illnesses that can be very dangerous to your baby, such as listeriosis. Ask your health care provider for more information about listeriosis. Make sure that all meats, poultry, and eggs are cooked to food-safe temperatures or "well-done." Meal planning  Eat a variety of foods (especially fruits and vegetables) to get a full range of vitamins and minerals. Two or more servings of fish are recommended each week in order to get the most benefits from omega-3 fatty acids that are found in seafood. Choose fish that are lower in mercury, such as salmon and pollock. Limit your overall intake of foods that have "empty calories." These are foods that have little nutritional value, such as sweets, desserts, candies, and sugar-sweetened beverages. Drinks that contain caffeine are okay to drink, but it is better to avoid caffeine. Keep your total caffeine intake to less than 200 mg each day (which is 12 oz or 355 mL of coffee, tea, or soda) or the limit as told by your  health care provider. General information Do not try to lose weight or go on a diet during pregnancy. Take a prenatal vitamin to help meet your additional vitamin and mineral needs during pregnancy, specifically for folic acid, iron, calcium, and vitamin D. Remember to stay active. Ask your health care provider what types of exercise and activities are safe for you. What does 150 extra calories look like? Healthy options that provide 150 extra calories each day could be any of the following: 6-8 oz (170-227 g) plain low-fat yogurt with  cup (70 g) berries. 1 apple with 2 tsp (11 g) peanut butter. Cut-up vegetables with  cup (60 g) hummus. 8 fl oz (237 mL) low-fat chocolate milk. 1 stick of string cheese with 1 medium orange. 1 peanut butter and jelly sandwich that is made with one slice of whole-wheat bread and 1 tsp (5 g) of peanut butter. For 300 extra calories, you could eat two of these healthy options each day. What is a healthy amount of weight to gain? The right amount of weight gain for you is based on your BMI (body mass index) before you became pregnant. If your BMI was less than 18 (underweight), you should gain 28-40 lb (13-18 kg). If your BMI was 18-24.9 (normal), you should gain 25-35 lb (11-16 kg). If your BMI was 25-29.9 (overweight), you should gain 15-25 lb (7-11 kg). If your BMI was 30 or greater (obese), you should gain 11-20 lb (5-9 kg). What if I am having twins or multiples? Generally, if you are carrying twins or multiples: You may need to eat   300-600 extra calories a day. The recommended range for total weight gain is 25-54 lb (11-25 kg), depending on your BMI before pregnancy. Talk with your health care provider to find out about nutritional needs, weight gain, and exercise that is right for you. What foods should I eat?  Fruits All fruits. Eat a variety of colors and types of fruit. Remember to wash your fruits well before peeling or eating. Vegetables All  vegetables. Eat a variety of colors and types of vegetables. Remember to wash your vegetables well before peeling or eating. Grains All grains. Choose whole grains, such as whole-wheat bread, oatmeal, or brown rice. Meats and other protein foods Lean meats, including chicken, turkey, and lean cuts of beef, veal, or pork. Fish that is higher in omega-3 fatty acids and lower in mercury, such as salmon, herring, mussels, trout, sardines, pollock, shrimp, crab, and lobster. Tofu. Tempeh. Beans. Eggs. Peanut butter and other nut butters. Dairy Pasteurized milk and milk alternatives, such as almond milk. Pasteurized yogurt and pasteurized cheese. Cottage cheese. Sour cream. Beverages Water. Juices that contain 100% fruit juice or vegetable juice. Caffeine-free teas and decaffeinated coffee. Fats and oils Fats and oils are okay to include in moderation. Sweets and desserts Sweets and desserts are okay to include in moderation. Seasoning and other foods All pasteurized condiments. The items listed above may not be a complete list of foods and beverages you can eat. Contact a dietitian for more information. What foods should I avoid? Fruits Raw (unpasteurized) fruit juices. Vegetables Unpasteurized vegetable juices. Meats and other protein foods Precooked or cured meat, such as bologna, hot dogs, sausages, or meat loaves. (If you must eat those meats, reheat them until they are steaming hot.) Refrigerated pate, meat spreads from a meat counter, or smoked seafood that is found in the refrigerated section of a store. Raw or undercooked meats, poultry, and eggs. Raw fish, such as sushi or sashimi. Fish that have high mercury content, such as tilefish, shark, swordfish, and king mackerel. Dairy Unpasteurized milk and any foods that have unpasteurized milk in them. Soft cheeses, such as feta, queso blanco, queso fresco, Brie, Camembert, panela, and blue-veined cheeses (unless they are made with pasteurized  milk, which must be stated on the label). Beverages Alcohol. Sugar-sweetened beverages, such as sodas, teas, or energy drinks. Seasoning and other foods Homemade fermented foods and drinks, such as pickles, sauerkraut, or kombucha drinks. (Store-bought pasteurized versions of these are okay.) Salads that are made in a store or deli, such as ham salad, chicken salad, egg salad, tuna salad, and seafood salad. The items listed above may not be a complete list of foods and beverages you should avoid. Contact a dietitian for more information. Where to find more information To calculate the number of calories you need based on your height, weight, and activity level, you can use an online calculator such as: www.myplate.gov/myplate-plan To calculate how much weight you should gain during pregnancy, you can use an online pregnancy weight gain calculator such as: www.myplate.gov To learn more about eating fish during pregnancy, talk with your health care provider or visit: www.fda.gov Summary While you are pregnant, your body requires additional nutrition to help support your growing baby. Eat a variety of foods, especially fruits and vegetables, to get a full range of vitamins and minerals. Practice good food safety and cleanliness. Wash your hands before you eat and after you prepare raw meat. Wash all fruits and vegetables well before peeling or eating. Taking these actions can   help to prevent foodborne illnesses, such as listeriosis, that can be very dangerous to your baby. Do not eat raw meat or fish. Do not eat fish that have high mercury content, such as tilefish, shark, swordfish, and king mackerel. Do not eat raw (unpasteurized) dairy. Take a prenatal vitamin to help meet your additional vitamin and mineral needs during pregnancy, specifically for folic acid, iron, calcium, and vitamin D. This information is not intended to replace advice given to you by your health care provider. Make sure you  discuss any questions you have with your health care provider. Document Revised: 03/10/2020 Document Reviewed: 03/10/2020 Elsevier Patient Education  2023 Elsevier Inc.  

## 2022-04-19 NOTE — Progress Notes (Signed)
New Patient Office Visit  Subjective    Patient ID: Melissa [name], female    DOB: [birth year]  Age: 26 y.o. MRN: [id number]  CC:  Chief Complaint  Patient presents with   New Patient (Initial Visit)    HPI Ms. Melissa [name] is a 27 year old obese pregnant female presents to establish care. She went to the ED and has a UTI she started medication but stopped because not able to keep food. Explain she would be referred to [facility] and would f/u with her after pregnancy. She has been experiencing nausea and vomiting. She is unable to keep anything down. Patient has No headache, No chest pain, No abdominal pain , No new weakness tingling or numbness, No Cough - shortness of breath    Outpatient Encounter Medications as of 04/19/2022  Medication Sig   Prenatal Vit-Fe Fumarate-FA (PRENATAL MULTIVITAMIN) TABS tablet Take 1 tablet by mouth daily at 12 noon.   No facility-administered encounter medications on file as of 04/19/2022.    Past Medical History:  Diagnosis Date   Anxiety    Asthma    Bipolar 1 disorder (HCC)    Chlamydia    Collapsed lung 06/11/2020   Depression    Dizziness    Gonorrhea    PTSD (post-traumatic stress disorder)    Rib fracture    Syncope     Past Surgical History:  Procedure Laterality Date   chest tube  06/11/2020    Family History  Problem Relation Age of Onset   Asthma Mother    Hypertension Father     Social History   Socioeconomic History   Marital status: Single    Spouse name: Not on file   Number of children: 0   Years of education: Not on file   Highest education level: Some college, no degree  Occupational History   Not on file  Tobacco Use   Smoking status: Never   Smokeless tobacco: Never  Vaping Use   Vaping Use: Never used  Substance and Sexual Activity   Alcohol use: Not Currently   Drug use: Not Currently    Types: Marijuana, Cocaine    Comment: 7 years   Sexual activity: Yes  Other Topics Concern   Not on  file  Social History Narrative   Not on file   Social Determinants of Health   Financial Resource Strain: Not on file  Food Insecurity: Not on file  Transportation Needs: Not on file  Physical Activity: Not on file  Stress: Not on file  Social Connections: Not on file  Intimate Partner Violence: Not on file    ROS Comprehensive ROS Pertinent positive and negative noted in HPI       Objective    LMP 02/09/2022 (Exact Date)   Physical Exam Vitals reviewed.  HENT:     Head: Normocephalic.     Right Ear: Tympanic membrane and external ear normal.     Left Ear: Tympanic membrane and external ear normal.     Nose: Nose normal.  Eyes:     Extraocular Movements: Extraocular movements intact.     Pupils: Pupils are equal, round, and reactive to light.  Cardiovascular:     Rate and Rhythm: Normal rate and regular rhythm.  Pulmonary:     Effort: Pulmonary effort is normal.     Breath sounds: Normal breath sounds.  Abdominal:     General: Bowel sounds are normal.     Palpations: Abdomen is soft.  Musculoskeletal:        General: Normal range of motion.     Cervical back: Normal range of motion.  Skin:    General: Skin is warm and dry.  Neurological:     Mental Status: She is alert and oriented to person, place, and time.  Psychiatric:        Mood and Affect: Mood normal.        Behavior: Behavior normal.   Recent Results (from the past 2160 hour(s))  POC Urine Pregnancy, ED (not at [name])     Status: Abnormal   Collection Time: 03/22/22  4:35 PM  Result Value Ref Range   Preg Test, Ur POSITIVE (A) NEGATIVE    Comment:        THE SENSITIVITY OF THIS METHODOLOGY IS >24 mIU/mL   Culture, OB Urine     Status: Abnormal   Collection Time: 03/22/22  4:35 PM   Specimen: Urine, Random  Result Value Ref Range   Specimen Description URINE, RANDOM    Special Requests NONE    Culture (A)     >=100,000 COLONIES/mL ESCHERICHIA COLI NO GROUP B STREP (S.AGALACTIAE)  ISOLATED Performed at [facility], [address] [place], [place] [address]    Report Status 03/24/2022 FINAL    Organism ID, Bacteria ESCHERICHIA COLI (A)       Susceptibility   Escherichia coli - MIC*    AMPICILLIN <=2 SENSITIVE Sensitive     CEFAZOLIN <=4 SENSITIVE Sensitive     CEFEPIME <=0.12 SENSITIVE Sensitive     CEFTRIAXONE <=0.25 SENSITIVE Sensitive     CIPROFLOXACIN <=0.25 SENSITIVE Sensitive     GENTAMICIN <=1 SENSITIVE Sensitive     IMIPENEM <=0.25 SENSITIVE Sensitive     NITROFURANTOIN <=16 SENSITIVE Sensitive     TRIMETH/SULFA <=20 SENSITIVE Sensitive     AMPICILLIN/SULBACTAM <=2 SENSITIVE Sensitive     PIP/TAZO <=4 SENSITIVE Sensitive     * >=100,000 COLONIES/mL ESCHERICHIA COLI  Urinalysis, Routine w reflex microscopic Urine, Clean Catch     Status: Abnormal   Collection Time: 03/22/22  5:23 PM  Result Value Ref Range   Color, Urine YELLOW YELLOW   APPearance HAZY (A) CLEAR   Specific Gravity, Urine 1.012 1.005 - 1.030   pH 5.0 5.0 - 8.0   Glucose, UA NEGATIVE NEGATIVE mg/dL   Hgb urine dipstick LARGE (A) NEGATIVE   Bilirubin Urine NEGATIVE NEGATIVE   Ketones, ur NEGATIVE NEGATIVE mg/dL   Protein, ur NEGATIVE NEGATIVE mg/dL   Nitrite NEGATIVE NEGATIVE   Leukocytes,Ua LARGE (A) NEGATIVE   RBC / HPF 21-50 0 - 5 RBC/hpf   WBC, UA >50 (H) 0 - 5 WBC/hpf   Bacteria, UA FEW (A) NONE SEEN   Squamous Epithelial / LPF 0-5 0 - 5   Mucus PRESENT     Comment: Performed at [facility], [address] [place], [place] [address]  GC/Chlamydia probe amp ([place])not at [facility]     Status: None   Collection Time: 03/22/22  5:38 PM  Result Value Ref Range   Neisseria Gonorrhea Negative    Chlamydia Negative    Comment Normal Reference [place] Chlamydia - Negative    Comment      Normal Reference Range Neisseria Gonorrhea - Negative  CBC     Status: Abnormal   Collection Time: 03/22/22  6:11 PM  Result Value Ref Range   WBC 7.8 4.0 - 10.5 K/uL   RBC  4.03 3.87 - 5.11  MIL/uL   Hemoglobin 11.5 (L) 12.0 - 15.0 g/dL   HCT 34.6 (L) 36.0 - 46.0 %   MCV 85.9 80.0 - 100.0 fL   MCH 28.5 26.0 - 34.0 pg   MCHC 33.2 30.0 - 36.0 g/dL   RDW 13.9 11.5 - 15.5 %   Platelets 283 150 - 400 K/uL   nRBC 0.0 0.0 - 0.2 %    Comment: Performed at [facility] [address] [place], [place] [address]  hCG, quantitative, pregnancy     Status: Abnormal   Collection Time: 03/22/22  6:11 PM  Result Value Ref Range   hCG, Beta Chain, Quant, S 34,779 (H) <5 mIU/mL    Comment:          GEST. AGE      CONC.  (mIU/mL)   <=1 WEEK        5 - 50     2 WEEKS       50 - 500     3 WEEKS       100 - 10,000     4 WEEKS     1,000 - 30,000     5 WEEKS     3,500 - 115,000   6-8 WEEKS     12,000 - 270,000    12 WEEKS     15,000 - 220,000        FEMALE AND NON-PREGNANT FEMALE:     LESS THAN 5 mIU/mL Performed at [facility], [place] [address] [place], [place] [address]   ABO/Rh     Status: None   Collection Time: 03/22/22  6:11 PM  Result Value Ref Range   ABO/RH(D) B POS    No rh immune globuloin      NOT A RH IMMUNE GLOBULIN CANDIDATE, PT RH POSITIVE Performed at [facility] [address], [place], [place] [address]   Wet prep, genital     Status: None   Collection Time: 03/22/22  6:33 PM   Specimen: PATH Cytology Cervicovaginal Ancillary Only  Result Value Ref Range   Yeast Wet Prep HPF POC NONE SEEN NONE SEEN   Trich, Wet Prep NONE SEEN NONE SEEN   Clue Cells Wet Prep HPF POC NONE SEEN NONE SEEN   WBC, Wet Prep HPF POC <10 <10   Sperm NONE SEEN     Comment: Performed at [facility], [place] [address] [place], [place] [address]       Assessment & Plan:  Melissa [name] was seen today for new patient (initial visit).  Diagnoses and all orders for this visit:  Encounter to establish care Establish care with PCP  [redacted] weeks gestation of pregnancy -     Ambulatory referral to Obstetrics / Gynecology   Nausea and vomiting, unspecified  vomiting type Secondary to pregnancy advise to drink water, sprite, gingeral, applesauce, soups, cracker stay away from fried foods   No follow-ups on file.   Kerin Perna, NP

## 2022-05-16 ENCOUNTER — Inpatient Hospital Stay (HOSPITAL_COMMUNITY)
Admission: AD | Admit: 2022-05-16 | Discharge: 2022-05-17 | Disposition: A | Discharge status: Home / Self Care | Payer: Medicaid Other | Attending: Family Medicine | Admitting: Family Medicine

## 2022-05-16 ENCOUNTER — Encounter (HOSPITAL_COMMUNITY): Payer: Self-pay | Admitting: Family Medicine

## 2022-05-16 DIAGNOSIS — R109 Unspecified abdominal pain: Secondary | ICD-10-CM | POA: Insufficient documentation

## 2022-05-16 DIAGNOSIS — S1093XA Contusion of unspecified part of neck, initial encounter: Secondary | ICD-10-CM | POA: Insufficient documentation

## 2022-05-16 DIAGNOSIS — R0781 Pleurodynia: Secondary | ICD-10-CM | POA: Insufficient documentation

## 2022-05-16 DIAGNOSIS — T7491XA Unspecified adult maltreatment, confirmed, initial encounter: Secondary | ICD-10-CM | POA: Insufficient documentation

## 2022-05-16 DIAGNOSIS — Z3A13 13 weeks gestation of pregnancy: Secondary | ICD-10-CM | POA: Insufficient documentation

## 2022-05-16 DIAGNOSIS — T07XXXA Unspecified multiple injuries, initial encounter: Secondary | ICD-10-CM | POA: Insufficient documentation

## 2022-05-16 DIAGNOSIS — O26891 Other specified pregnancy related conditions, first trimester: Secondary | ICD-10-CM | POA: Insufficient documentation

## 2022-05-16 DIAGNOSIS — O0931 Supervision of pregnancy with insufficient antenatal care, first trimester: Secondary | ICD-10-CM | POA: Insufficient documentation

## 2022-05-16 DIAGNOSIS — S0093XA Contusion of unspecified part of head, initial encounter: Secondary | ICD-10-CM | POA: Insufficient documentation

## 2022-05-16 DIAGNOSIS — O99321 Drug use complicating pregnancy, first trimester: Secondary | ICD-10-CM | POA: Insufficient documentation

## 2022-05-16 DIAGNOSIS — R0989 Other specified symptoms and signs involving the circulatory and respiratory systems: Secondary | ICD-10-CM | POA: Insufficient documentation
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
## 2022-05-17 ENCOUNTER — Inpatient Hospital Stay (HOSPITAL_COMMUNITY): Payer: Medicaid Other

## 2022-05-17 DIAGNOSIS — S0093XA Contusion of unspecified part of head, initial encounter: Secondary | ICD-10-CM | POA: Diagnosis not present

## 2022-05-17 DIAGNOSIS — O26891 Other specified pregnancy related conditions, first trimester: Secondary | ICD-10-CM | POA: Diagnosis not present

## 2022-05-17 DIAGNOSIS — O99321 Drug use complicating pregnancy, first trimester: Secondary | ICD-10-CM | POA: Diagnosis not present

## 2022-05-17 DIAGNOSIS — R109 Unspecified abdominal pain: Secondary | ICD-10-CM | POA: Diagnosis not present

## 2022-05-17 DIAGNOSIS — Z3A13 13 weeks gestation of pregnancy: Secondary | ICD-10-CM | POA: Diagnosis not present

## 2022-05-17 DIAGNOSIS — R0989 Other specified symptoms and signs involving the circulatory and respiratory systems: Secondary | ICD-10-CM | POA: Diagnosis present

## 2022-05-17 DIAGNOSIS — O0931 Supervision of pregnancy with insufficient antenatal care, first trimester: Secondary | ICD-10-CM | POA: Diagnosis not present

## 2022-05-17 DIAGNOSIS — O26892 Other specified pregnancy related conditions, second trimester: Secondary | ICD-10-CM

## 2022-05-17 DIAGNOSIS — F39 Unspecified mood [affective] disorder: Secondary | ICD-10-CM | POA: Insufficient documentation

## 2022-05-17 DIAGNOSIS — T07XXXA Unspecified multiple injuries, initial encounter: Secondary | ICD-10-CM | POA: Diagnosis not present

## 2022-05-17 DIAGNOSIS — S1093XA Contusion of unspecified part of neck, initial encounter: Secondary | ICD-10-CM | POA: Diagnosis not present

## 2022-05-17 DIAGNOSIS — T7491XA Unspecified adult maltreatment, confirmed, initial encounter: Secondary | ICD-10-CM | POA: Diagnosis not present

## 2022-05-17 DIAGNOSIS — R0781 Pleurodynia: Secondary | ICD-10-CM | POA: Diagnosis not present
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
## 2022-05-17 NOTE — Progress Notes (Signed)
Taxi Voucher provided to address on file.  Corene Cornea BSN, Engineer, materials   [facility] [employer] [phone] [email or web address]

## 2022-05-29 ENCOUNTER — Emergency Department: Payer: Medicaid Other

## 2022-05-29 ENCOUNTER — Other Ambulatory Visit: Payer: Self-pay

## 2022-05-29 ENCOUNTER — Emergency Department
Admission: EM | Admit: 2022-05-29 | Discharge: 2022-05-29 | Disposition: A | Discharge status: Home / Self Care | Payer: Medicaid Other | Attending: Emergency Medicine | Admitting: Emergency Medicine

## 2022-05-29 DIAGNOSIS — O26892 Other specified pregnancy related conditions, second trimester: Secondary | ICD-10-CM | POA: Diagnosis present

## 2022-05-29 DIAGNOSIS — J45909 Unspecified asthma, uncomplicated: Secondary | ICD-10-CM | POA: Insufficient documentation

## 2022-05-29 DIAGNOSIS — Z3A16 16 weeks gestation of pregnancy: Secondary | ICD-10-CM | POA: Diagnosis not present

## 2022-05-29 DIAGNOSIS — R109 Unspecified abdominal pain: Secondary | ICD-10-CM

## 2022-05-29 DIAGNOSIS — O99512 Diseases of the respiratory system complicating pregnancy, second trimester: Secondary | ICD-10-CM | POA: Insufficient documentation

## 2022-05-29 DIAGNOSIS — O99012 Anemia complicating pregnancy, second trimester: Secondary | ICD-10-CM | POA: Diagnosis not present

## 2022-05-29 DIAGNOSIS — O26852 Spotting complicating pregnancy, second trimester: Secondary | ICD-10-CM | POA: Insufficient documentation

## 2022-05-29 LAB — URINALYSIS, ROUTINE W REFLEX MICROSCOPIC
Bilirubin Urine: NEGATIVE
Glucose, UA: NEGATIVE mg/dL
Hgb urine dipstick: NEGATIVE
Ketones, ur: NEGATIVE mg/dL
Leukocytes,Ua: NEGATIVE
Nitrite: NEGATIVE
Protein, ur: NEGATIVE mg/dL
Specific Gravity, Urine: 1.021 (ref 1.005–1.030)
pH: 5 (ref 5.0–8.0)

## 2022-05-29 LAB — COMPREHENSIVE METABOLIC PANEL
ALT: 31 U/L (ref 0–44)
AST: 19 U/L (ref 15–41)
Albumin: 3.1 g/dL — ABNORMAL LOW (ref 3.5–5.0)
Alkaline Phosphatase: 63 U/L (ref 38–126)
Anion gap: 5 (ref 5–15)
BUN: <5 mg/dL — ABNORMAL LOW (ref 6–20)
CO2: 21 mmol/L — ABNORMAL LOW (ref 22–32)
Calcium: 8.6 mg/dL — ABNORMAL LOW (ref 8.9–10.3)
Chloride: 109 mmol/L (ref 98–111)
Creatinine, Ser: 0.78 mg/dL (ref 0.44–1.00)
GFR, Estimated: >60 mL/min (ref >60)
Glucose, Bld: 85 mg/dL (ref 70–99)
Potassium: 3.4 mmol/L — ABNORMAL LOW (ref 3.5–5.1)
Sodium: 135 mmol/L (ref 135–145)
Total Bilirubin: 0.3 mg/dL (ref 0.3–1.2)
Total Protein: 6.8 g/dL (ref 6.5–8.1)

## 2022-05-29 LAB — CBC
HCT: 34.4 % — ABNORMAL LOW (ref 36.0–46.0)
Hemoglobin: 11.2 g/dL — ABNORMAL LOW (ref 12.0–15.0)
MCH: 28.8 pg (ref 26.0–34.0)
MCHC: 32.6 g/dL (ref 30.0–36.0)
MCV: 88.4 fL (ref 80.0–100.0)
Platelets: 296 10*3/uL (ref 150–400)
RBC: 3.89 MIL/uL (ref 3.87–5.11)
RDW: 15 % (ref 11.5–15.5)
WBC: 7.8 10*3/uL (ref 4.0–10.5)
nRBC: 0 % (ref 0.0–0.2)

## 2022-05-29 LAB — POC URINE PREG, ED: Preg Test, Ur: POSITIVE — AB

## 2022-05-29 LAB — HCG, QUANTITATIVE, PREGNANCY: hCG, Beta Chain, Quant, S: 33167 m[IU]/mL — ABNORMAL HIGH (ref <5)

## 2022-05-29 LAB — LIPASE, BLOOD: Lipase: 31 U/L (ref 11–51)

## 2022-05-29 NOTE — Discharge Instructions (Addendum)
-  Please follow-up with the OB/GYN listed in these instructions.  You may call them today to schedule an appointment..  Let them know that you were seen here in the emergency department.  -Return to the emergency department at any time if you begin to experience any new or worsening symptoms.

## 2022-05-29 NOTE — ED Triage Notes (Addendum)
Pt comes with c/o lower belly pain for 3 days. Pt states no N/V/D or urinary symptoms.   Pt is 4 months pregnant. Pt also states some spotting for two day.

## 2022-05-29 NOTE — ED Notes (Addendum)
FHR 140

## 2022-05-29 NOTE — ED Provider Notes (Signed)
[facility] Provider Note    Event Date/Time   First MD Initiated Contact with Patient 05/29/22 [id number]     (approximate)   History   Chief Complaint Abdominal Pain   HPI Melissa [name] is a 26 y.o. female, history of schizoaffective disorder (depressive type), mood disorder, anxiety, asthma, presenting to the emergency department for evaluation of abdominal pain.  She states that it has been going on for 3 days.  Described as a crampy, tight sensation.  Reports some vaginal spotting as well.  She states that she should be approximately 4 months pregnant.  She [place], but would like a new one today since she has just moved here.  Denies fever/chills, chest pain, shortness of breath, flank pain, nausea/vomiting, diarrhea, dysuria, headache, rash/lesions, or dizziness/lightheadedness.  History Limitations: No limitations.        Physical Exam  Triage Vital Signs: ED Triage Vitals  Enc Vitals Group     BP 05/29/22 0926 98/73     Pulse Rate 05/29/22 0926 68     Resp 05/29/22 0926 17     Temp 05/29/22 0926 98.4 F (36.9 C)     Temp src --      SpO2 --      Weight --      Height --      Head Circumference --      Peak Flow --      Pain Score 05/29/22 0923 5     Pain Loc --      Pain Edu? --      Excl. in GC? --     Most recent vital signs: Vitals:   05/29/22 0926  BP: 98/73  Pulse: 68  Resp: 17  Temp: 98.4 F (36.9 C)    General: Awake, NAD.  Skin: Warm, dry. No rashes or lesions.  Eyes: PERRL. Conjunctivae normal.  CV: Good peripheral perfusion.  Resp: Normal effort.  Abd: Soft, non-tender. No distention.  Neuro: At baseline. No gross neurological deficits.  Musculoskeletal: Normal ROM of all extremities.  Focused Exam: N/A.  Physical Exam    ED Results / Procedures / Treatments  Labs (all labs ordered are listed, but only abnormal results are displayed) Labs Reviewed  COMPREHENSIVE METABOLIC PANEL -  Abnormal; Notable for the following components:      Result Value   Potassium 3.4 (*)    CO2 21 (*)    BUN <5 (*)    Calcium 8.6 (*)    Albumin 3.1 (*)    All other components within normal limits  CBC - Abnormal; Notable for the following components:   Hemoglobin 11.2 (*)    HCT 34.4 (*)    All other components within normal limits  URINALYSIS, ROUTINE W REFLEX MICROSCOPIC - Abnormal; Notable for the following components:   Color, Urine AMBER (*)    APPearance HAZY (*)    All other components within normal limits  HCG, QUANTITATIVE, PREGNANCY - Abnormal; Notable for the following components:   hCG, Beta Chain, Quant, S 33,167 (*)    All other components within normal limits  POC URINE PREG, ED - Abnormal; Notable for the following components:   Preg Test, Ur Positive (*)    All other components within normal limits  LIPASE, BLOOD     EKG N/A.    RADIOLOGY  ED Provider Interpretation: I personally reviewed and interpreted this ultrasound, single viable fetus at 16 weeks.  US OB Limited >  14 wks  Result Date: 05/29/2022 CLINICAL DATA:  Vaginal bleeding over the last 2 days. EXAM: LIMITED OBSTETRIC ULTRASOUND COMPARISON:  04/03/2022 FINDINGS: Number of Fetuses: 1 Heart Rate:  148 bpm Movement: Present Presentation: Breech Placental Location: Anterior Previa: Not present.  4.3 cm distance from the cervical os. Amniotic Fluid (Subjective):  Within normal limits. AFI: 4.7 cm BPD: 3.3 cm 16 w  2 d MATERNAL FINDINGS: Cervix:  Appears closed.  Cervical length 3.1 cm. Uterus/Adnexae: No abnormality visualized. IMPRESSION: No abnormality seen to explain bleeding. Single viable fetus at 16 weeks 2 days by BPD. Appropriate interval growth. This exam is performed on an emergent basis and does not comprehensively evaluate fetal size, dating, or anatomy; follow-up complete OB US should be considered if further fetal assessment is warranted. Electronically Signed   By: Nelson Chimes M.D.   On:  05/29/2022 10:27    PROCEDURES:  Critical Care performed: N/A.  Procedures    MEDICATIONS ORDERED IN ED: Medications - No data to display   IMPRESSION / MDM / [place] / ED COURSE  I reviewed the triage vital signs and the nursing notes.                              Differential diagnosis includes, but is not limited to, uterine rupture, subchorionic hemorrhage, spontaneous abortion, cervical polyps, fibroids, cystitis.   ED Course Patient appears well, vitals within normal limits.  NAD.  Point-of-care urine pregnancy positive.  Beta hCG 33,167  CBC shows no leukocytosis.  Mild anemia present at 11.2, consistent with previous values.  CMP shows no significant electrolyte abnormalities, AKI, or transaminitis.  Lipase unremarkable at 31.  Assessment/Plan Patient presents with mild abdominal cramping with some vaginal spotting.  She appears well clinically.  Physical exam is unremarkable.  Lab work-up is reassuring.  Urinalysis shows no signs of infection.  Her point-of-care ultrasound does show a viable pregnancy at 16 weeks.  Normal fetal heart tones.  Unclear the etiology of the patient's pain, however I suspect that this is likely benign in nature.  We will provide her with a referral to OB/GYN for follow-up.  Patient was amenable to this plan.  Will discharge.  Considered admission for this patient, but given her stable presentation and unremarkable work-up, she is unlikely to benefit from admission.  Provided the patient with anticipatory guidance, return precautions, and educational material. Encouraged the patient to return to the emergency department at any time if they begin to experience any new or worsening symptoms. Patient expressed understanding and agreed with the plan.   Patient's presentation is most consistent with acute complicated illness / injury requiring diagnostic workup.       FINAL CLINICAL IMPRESSION(S) / ED DIAGNOSES   Final diagnoses:   Abdominal pain during pregnancy in second trimester     Rx / DC Orders   ED Discharge Orders     None        Note:  This document was prepared using Dragon voice recognition software and may include unintentional dictation errors.   Teodoro Spray, [place] 05/29/22 1130    Harvest Dark, MD 05/29/22 1442

## 2022-05-31 ENCOUNTER — Ambulatory Visit (INDEPENDENT_AMBULATORY_CARE_PROVIDER_SITE_OTHER): Payer: Medicaid Other

## 2022-05-31 VITALS — Wt 184.0 lb

## 2022-05-31 DIAGNOSIS — Z3402 Encounter for supervision of normal first pregnancy, second trimester: Secondary | ICD-10-CM

## 2022-05-31 DIAGNOSIS — Z3689 Encounter for other specified antenatal screening: Secondary | ICD-10-CM

## 2022-05-31 DIAGNOSIS — Z369 Encounter for antenatal screening, unspecified: Secondary | ICD-10-CM

## 2022-05-31 DIAGNOSIS — Z3480 Encounter for supervision of other normal pregnancy, unspecified trimester: Secondary | ICD-10-CM
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
## 2022-06-01 ENCOUNTER — Other Ambulatory Visit: Payer: Medicaid Other

## 2022-06-01 ENCOUNTER — Other Ambulatory Visit: Payer: Self-pay

## 2022-06-01 DIAGNOSIS — Z113 Encounter for screening for infections with a predominantly sexual mode of transmission: Secondary | ICD-10-CM

## 2022-06-01 DIAGNOSIS — Z3402 Encounter for supervision of normal first pregnancy, second trimester: Secondary | ICD-10-CM

## 2022-06-01 DIAGNOSIS — Z369 Encounter for antenatal screening, unspecified: Secondary | ICD-10-CM

## 2022-06-01 DIAGNOSIS — Z3A16 16 weeks gestation of pregnancy: Secondary | ICD-10-CM

## 2022-06-02 LAB — CBC/D/PLT+RPR+RH+ABO+RUBIGG...
Antibody Screen: NEGATIVE
Basophils Absolute: 0.1 10*3/uL (ref 0.0–0.2)
Basos: 1 %
EOS (ABSOLUTE): 0.3 10*3/uL (ref 0.0–0.4)
Eos: 3 %
HCV Ab: NONREACTIVE
HIV Screen 4th Generation wRfx: NONREACTIVE
Hematocrit: 36.6 % (ref 34.0–46.6)
Hemoglobin: 12.2 g/dL (ref 11.1–15.9)
Hepatitis B Surface Ag: NEGATIVE
Immature Grans (Abs): 0 10*3/uL (ref 0.0–0.1)
Immature Granulocytes: 0 %
Lymphocytes Absolute: 2.2 10*3/uL (ref 0.7–3.1)
Lymphs: 23 %
MCH: 29 pg (ref 26.6–33.0)
MCHC: 33.3 g/dL (ref 31.5–35.7)
MCV: 87 fL (ref 79–97)
Monocytes Absolute: 0.6 10*3/uL (ref 0.1–0.9)
Monocytes: 6 %
Neutrophils Absolute: 6.3 10*3/uL (ref 1.4–7.0)
Neutrophils: 67 %
Platelets: 324 10*3/uL (ref 150–450)
RBC: 4.21 x10E6/uL (ref 3.77–5.28)
RDW: 13.9 % (ref 11.7–15.4)
RPR Ser Ql: NONREACTIVE
Rh Factor: POSITIVE
Rubella Antibodies, IGG: 7.92 index (ref >0.99)
Varicella zoster IgG: 568 index (ref >165)
WBC: 9.5 10*3/uL (ref 3.4–10.8)

## 2022-06-02 LAB — URINALYSIS, ROUTINE W REFLEX MICROSCOPIC
Bilirubin, UA: NEGATIVE
Glucose, UA: NEGATIVE
Leukocytes,UA: NEGATIVE
Nitrite, UA: NEGATIVE
RBC, UA: NEGATIVE
Specific Gravity, UA: 1.028 (ref 1.005–1.030)
Urobilinogen, Ur: 1 mg/dL (ref 0.2–1.0)
pH, UA: 7 (ref 5.0–7.5)

## 2022-06-02 LAB — MICROSCOPIC EXAMINATION
Bacteria, UA: NONE SEEN
Casts: NONE SEEN /lpf

## 2022-06-02 LAB — HCV INTERPRETATION

## 2022-06-02 IMAGING — DX DG CHEST 1V PORT
1 series · 1 of 1 positions shown · non-contrast
Comparison: Same day.

CLINICAL DATA: Status post chest tube removal.

EXAM:
PORTABLE CHEST 1 VIEW

[chest]
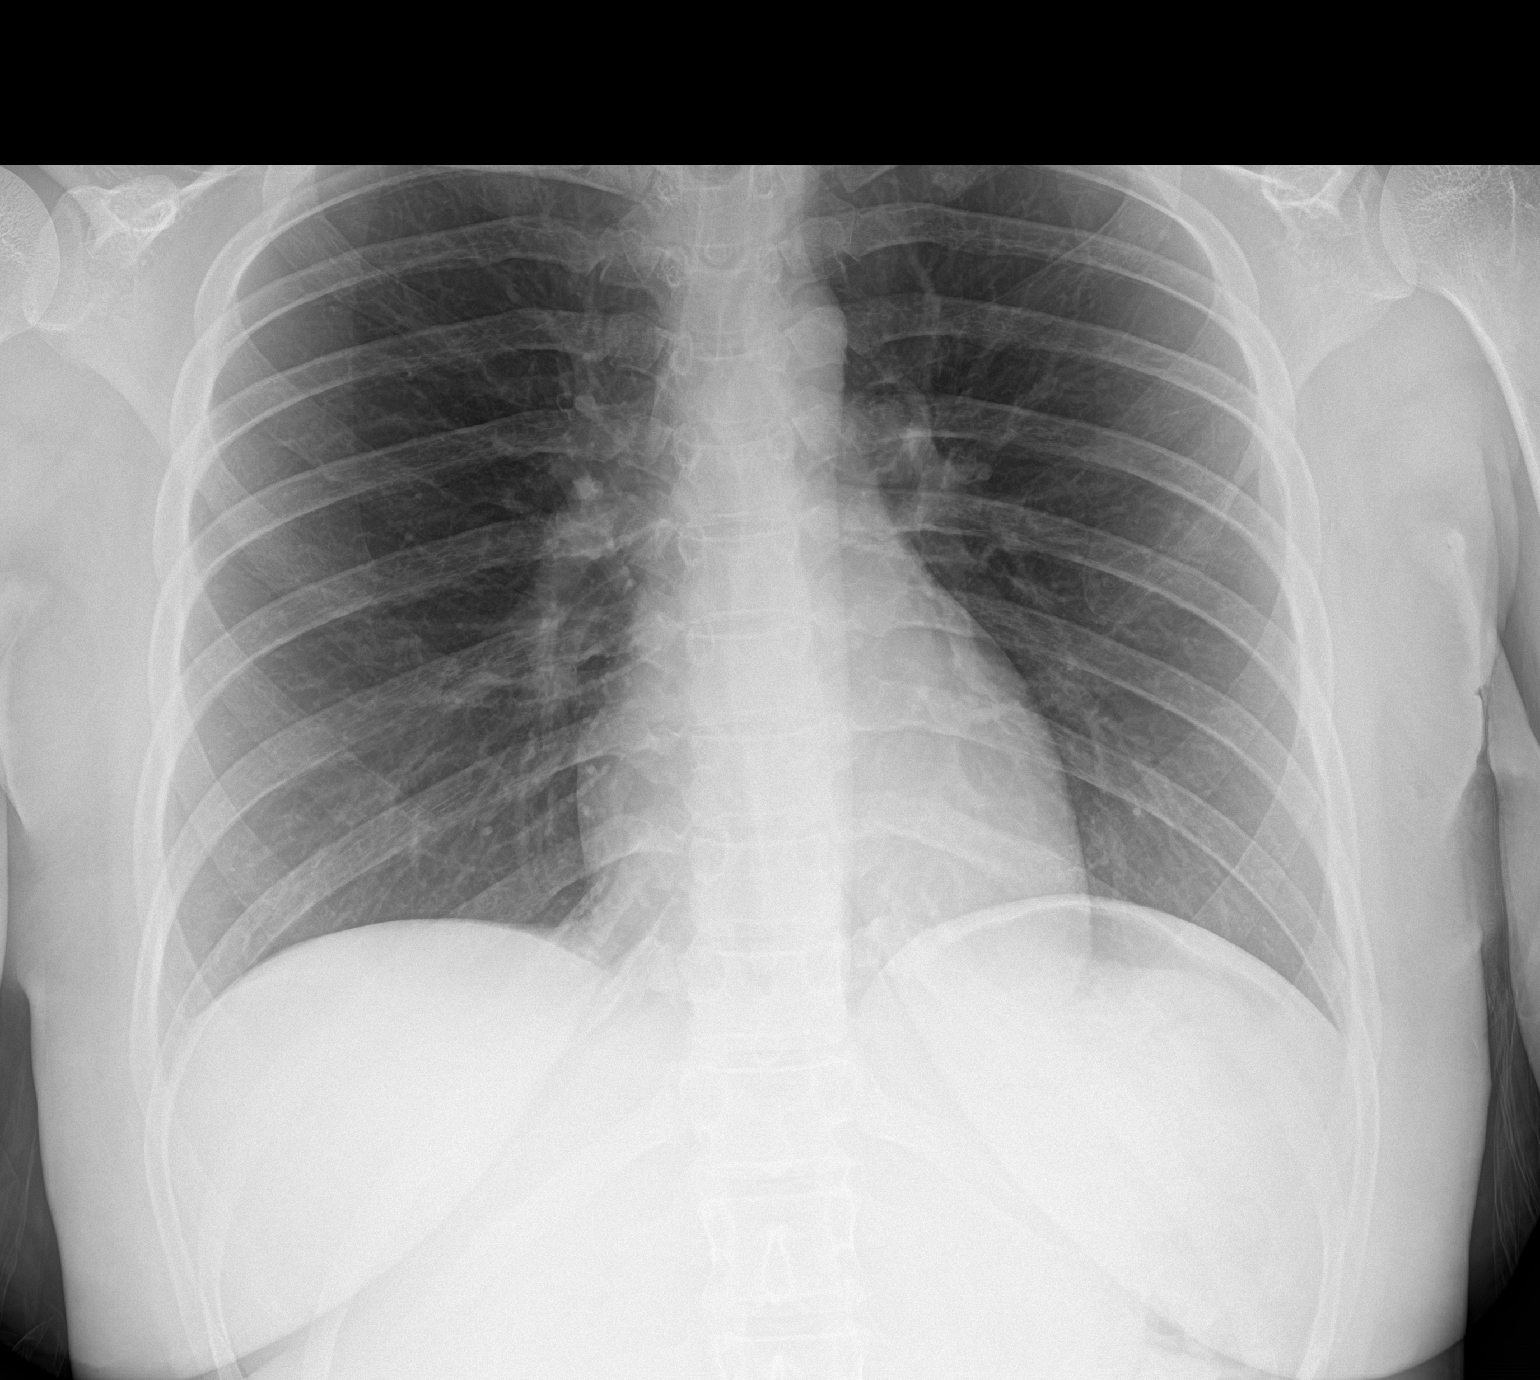

[1 of 1 positions shown; findings below may reference images not displayed]

FINDINGS: The heart size and mediastinal contours are within normal limits.
Both lungs are clear. Left-sided chest tube has been removed. No
pneumothorax or pleural effusion is noted. The visualized skeletal
structures are unremarkable.
IMPRESSION: No pneumothorax status post left-sided chest tube removal.

## 2022-06-03 LAB — URINE CULTURE, OB REFLEX: Organism ID, Bacteria: NO GROWTH

## 2022-06-03 LAB — CULTURE, OB URINE

## 2022-06-04 LAB — GC/CHLAMYDIA PROBE AMP
Chlamydia trachomatis, NAA: NEGATIVE
Neisseria Gonorrhoeae by PCR: NEGATIVE

## 2022-06-06 LAB — PAIN MGT SCRN (14 DRUGS), UR
Amphetamine Scrn, Ur: NEGATIVE ng/mL
BARBITURATE SCREEN URINE: NEGATIVE ng/mL
BENZODIAZEPINE SCREEN, URINE: NEGATIVE ng/mL
Buprenorphine, Urine: NEGATIVE ng/mL
CANNABINOIDS UR QL SCN: POSITIVE ng/mL — AB
Cocaine (Metab) Scrn, Ur: NEGATIVE ng/mL
Creatinine(Crt), U: 363.8 mg/dL — ABNORMAL HIGH (ref 20.0–300.0)
Fentanyl, Urine: NEGATIVE pg/mL
Meperidine Screen, Urine: NEGATIVE ng/mL
Methadone Screen, Urine: NEGATIVE ng/mL
OXYCODONE+OXYMORPHONE UR QL SCN: NEGATIVE ng/mL
Opiate Scrn, Ur: NEGATIVE ng/mL
Ph of Urine: 7 (ref 4.5–8.9)
Phencyclidine Qn, Ur: NEGATIVE ng/mL
Propoxyphene Scrn, Ur: NEGATIVE ng/mL
Tramadol Screen, Urine: NEGATIVE ng/mL

## 2022-06-06 LAB — NICOTINE SCREEN, URINE: Cotinine Ql Scrn, Ur: NEGATIVE ng/mL

## 2022-06-07 ENCOUNTER — Ambulatory Visit: Payer: Medicaid Other | Admitting: Family Medicine

## 2022-06-07 ENCOUNTER — Encounter: Payer: Medicaid Other | Admitting: Obstetrics and Gynecology

## 2022-06-07 DIAGNOSIS — Z3A17 17 weeks gestation of pregnancy: Secondary | ICD-10-CM

## 2022-06-07 DIAGNOSIS — Z124 Encounter for screening for malignant neoplasm of cervix: Secondary | ICD-10-CM

## 2022-06-07 DIAGNOSIS — Z3482 Encounter for supervision of other normal pregnancy, second trimester: Secondary | ICD-10-CM

## 2022-06-07 LAB — MATERNIT 21 PLUS CORE, BLOOD
Fetal Fraction: 6
Result (T21): NEGATIVE
Trisomy 13 (Patau syndrome): NEGATIVE
Trisomy 18 (Edwards syndrome): NEGATIVE
Trisomy 21 (Down syndrome): NEGATIVE

## 2022-06-21 ENCOUNTER — Ambulatory Visit (INDEPENDENT_AMBULATORY_CARE_PROVIDER_SITE_OTHER): Payer: Medicaid Other | Admitting: Primary Care

## 2022-06-27 ENCOUNTER — Other Ambulatory Visit (HOSPITAL_COMMUNITY)
Admission: RE | Admit: 2022-06-27 | Discharge: 2022-06-27 | Disposition: A | Discharge status: Home / Self Care | Payer: Medicaid Other | Source: Ambulatory Visit | Attending: Obstetrics and Gynecology | Admitting: Obstetrics and Gynecology

## 2022-06-27 ENCOUNTER — Encounter: Payer: Self-pay | Admitting: Obstetrics and Gynecology

## 2022-06-27 ENCOUNTER — Other Ambulatory Visit: Payer: Self-pay | Admitting: Obstetrics and Gynecology

## 2022-06-27 ENCOUNTER — Ambulatory Visit (INDEPENDENT_AMBULATORY_CARE_PROVIDER_SITE_OTHER): Payer: Medicaid Other | Admitting: Obstetrics and Gynecology

## 2022-06-27 ENCOUNTER — Other Ambulatory Visit: Payer: Self-pay

## 2022-06-27 VITALS — BP 90/64 | HR 84 | Wt 183.6 lb

## 2022-06-27 DIAGNOSIS — O35BXX0 Maternal care for other (suspected) fetal abnormality and damage, fetal cardiac anomalies, not applicable or unspecified: Secondary | ICD-10-CM

## 2022-06-27 DIAGNOSIS — Z8279 Family history of other congenital malformations, deformations and chromosomal abnormalities: Secondary | ICD-10-CM

## 2022-06-27 DIAGNOSIS — Z3482 Encounter for supervision of other normal pregnancy, second trimester: Secondary | ICD-10-CM

## 2022-06-27 DIAGNOSIS — Z3A19 19 weeks gestation of pregnancy: Secondary | ICD-10-CM

## 2022-06-27 DIAGNOSIS — Z124 Encounter for screening for malignant neoplasm of cervix: Secondary | ICD-10-CM | POA: Diagnosis present

## 2022-06-27 HISTORY — DX: Maternal care for other (suspected) fetal abnormality and damage, fetal cardiac anomalies, not applicable or unspecified: O35.BXX0

## 2022-06-27 LAB — POCT URINALYSIS DIPSTICK
Glucose, UA: NEGATIVE
Protein, UA: NEGATIVE
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
## 2022-06-27 NOTE — Progress Notes (Unsigned)
MFM requires ultrasound with all consults. Order placed for detailed scan per MFM protocol.

## 2022-06-27 NOTE — Progress Notes (Signed)
Patient presents today for New OB physical. She states strong daily fetal movement. She has complaints of shortness of breath whether sitting or with exertion. Patient is due for her pap smear. She had genetic testing, Materniti21 negative, declined AFP. Patient states no other questions or concerns at this time.

## 2022-06-28 ENCOUNTER — Other Ambulatory Visit: Payer: Self-pay | Admitting: Obstetrics and Gynecology

## 2022-06-28 ENCOUNTER — Ambulatory Visit (INDEPENDENT_AMBULATORY_CARE_PROVIDER_SITE_OTHER): Payer: Medicaid Other

## 2022-06-28 DIAGNOSIS — Z363 Encounter for antenatal screening for malformations: Secondary | ICD-10-CM | POA: Diagnosis not present

## 2022-06-28 DIAGNOSIS — Z3A19 19 weeks gestation of pregnancy: Secondary | ICD-10-CM | POA: Diagnosis not present

## 2022-07-02 LAB — CYTOLOGY - PAP: Diagnosis: NEGATIVE

## 2022-07-06 ENCOUNTER — Ambulatory Visit: Payer: Medicaid Other

## 2022-07-25 ENCOUNTER — Ambulatory Visit (INDEPENDENT_AMBULATORY_CARE_PROVIDER_SITE_OTHER): Payer: Medicaid Other | Admitting: Obstetrics

## 2022-07-25 VITALS — BP 109/74 | HR 102 | Wt 185.0 lb

## 2022-07-25 DIAGNOSIS — Z3A23 23 weeks gestation of pregnancy: Secondary | ICD-10-CM

## 2022-07-25 DIAGNOSIS — Z3482 Encounter for supervision of other normal pregnancy, second trimester: Secondary | ICD-10-CM

## 2022-07-25 DIAGNOSIS — O0992 Supervision of high risk pregnancy, unspecified, second trimester: Secondary | ICD-10-CM

## 2022-07-25 LAB — POCT URINALYSIS DIPSTICK OB
Bilirubin, UA: NEGATIVE
Blood, UA: NEGATIVE
Glucose, UA: NEGATIVE
Ketones, UA: NEGATIVE
Leukocytes, UA: NEGATIVE
Nitrite, UA: NEGATIVE
POC,PROTEIN,UA: NEGATIVE
Spec Grav, UA: 1.02 (ref 1.010–1.025)
Urobilinogen, UA: 0.2 E.U./dL
pH, UA: 7 (ref 5.0–8.0)

## 2022-07-25 NOTE — Progress Notes (Signed)
ROB at [redacted]w[redacted]d. Active baby. [place] reports that she has been feeling happy and excited is preparing for baby [place]. She has had a few cramps over the past few days. Reviewed s/s of PTL. Interested in childbirth class. Discussed comfort measures for sciatic pain. Gayland Curry in to assess needs today. Fetal echo/MFM [place] scheduled for 07/31/22.Reviewed 1-hour glucose, RPR CBC at next visit.  Alternate glucose handout given. RTC in 4 weeks.

## 2022-07-31 ENCOUNTER — Other Ambulatory Visit: Payer: Self-pay | Admitting: Obstetrics and Gynecology

## 2022-07-31 ENCOUNTER — Ambulatory Visit (HOSPITAL_BASED_OUTPATIENT_CLINIC_OR_DEPARTMENT_OTHER): Payer: Medicaid Other

## 2022-07-31 ENCOUNTER — Ambulatory Visit: Payer: Medicaid Other | Attending: Obstetrics and Gynecology | Admitting: *Deleted

## 2022-07-31 VITALS — BP 103/68 | HR 86

## 2022-07-31 DIAGNOSIS — Z3480 Encounter for supervision of other normal pregnancy, unspecified trimester: Secondary | ICD-10-CM

## 2022-07-31 DIAGNOSIS — Z8279 Family history of other congenital malformations, deformations and chromosomal abnormalities: Secondary | ICD-10-CM | POA: Diagnosis not present

## 2022-07-31 DIAGNOSIS — Z3482 Encounter for supervision of other normal pregnancy, second trimester: Secondary | ICD-10-CM

## 2022-08-01 ENCOUNTER — Other Ambulatory Visit: Payer: Self-pay | Admitting: *Deleted

## 2022-08-01 DIAGNOSIS — F1290 Cannabis use, unspecified, uncomplicated: Secondary | ICD-10-CM

## 2022-08-01 DIAGNOSIS — Z8279 Family history of other congenital malformations, deformations and chromosomal abnormalities: Secondary | ICD-10-CM

## 2022-08-22 ENCOUNTER — Ambulatory Visit: Payer: Medicaid Other | Attending: Obstetrics and Gynecology

## 2022-08-22 ENCOUNTER — Encounter: Payer: Self-pay | Admitting: Obstetrics and Gynecology

## 2022-08-22 ENCOUNTER — Other Ambulatory Visit: Payer: Medicaid Other

## 2022-08-22 ENCOUNTER — Ambulatory Visit (INDEPENDENT_AMBULATORY_CARE_PROVIDER_SITE_OTHER): Payer: Medicaid Other | Admitting: Obstetrics and Gynecology

## 2022-08-22 ENCOUNTER — Ambulatory Visit: Payer: Medicaid Other | Admitting: *Deleted

## 2022-08-22 ENCOUNTER — Other Ambulatory Visit: Payer: Self-pay | Admitting: Obstetrics

## 2022-08-22 ENCOUNTER — Ambulatory Visit: Payer: Medicaid Other | Attending: Obstetrics

## 2022-08-22 VITALS — BP 92/65 | HR 81 | Wt 180.0 lb

## 2022-08-22 VITALS — BP 114/66 | HR 88

## 2022-08-22 DIAGNOSIS — Q213 Tetralogy of Fallot: Secondary | ICD-10-CM | POA: Insufficient documentation

## 2022-08-22 DIAGNOSIS — F419 Anxiety disorder, unspecified: Secondary | ICD-10-CM

## 2022-08-22 DIAGNOSIS — Z3A27 27 weeks gestation of pregnancy: Secondary | ICD-10-CM

## 2022-08-22 DIAGNOSIS — O99320 Drug use complicating pregnancy, unspecified trimester: Secondary | ICD-10-CM | POA: Insufficient documentation

## 2022-08-22 DIAGNOSIS — Z3480 Encounter for supervision of other normal pregnancy, unspecified trimester: Secondary | ICD-10-CM

## 2022-08-22 DIAGNOSIS — O99322 Drug use complicating pregnancy, second trimester: Secondary | ICD-10-CM | POA: Diagnosis not present

## 2022-08-22 DIAGNOSIS — F209 Schizophrenia, unspecified: Secondary | ICD-10-CM | POA: Diagnosis not present

## 2022-08-22 DIAGNOSIS — O365920 Maternal care for other known or suspected poor fetal growth, second trimester, not applicable or unspecified: Secondary | ICD-10-CM | POA: Diagnosis not present

## 2022-08-22 DIAGNOSIS — O0992 Supervision of high risk pregnancy, unspecified, second trimester: Secondary | ICD-10-CM

## 2022-08-22 DIAGNOSIS — Z8279 Family history of other congenital malformations, deformations and chromosomal abnormalities: Secondary | ICD-10-CM

## 2022-08-22 DIAGNOSIS — F1290 Cannabis use, unspecified, uncomplicated: Secondary | ICD-10-CM | POA: Insufficient documentation

## 2022-08-22 DIAGNOSIS — O365930 Maternal care for other known or suspected poor fetal growth, third trimester, not applicable or unspecified: Secondary | ICD-10-CM | POA: Insufficient documentation

## 2022-08-22 DIAGNOSIS — O99342 Other mental disorders complicating pregnancy, second trimester: Secondary | ICD-10-CM | POA: Diagnosis not present

## 2022-08-22 DIAGNOSIS — O09292 Supervision of pregnancy with other poor reproductive or obstetric history, second trimester: Secondary | ICD-10-CM | POA: Diagnosis not present

## 2022-08-22 DIAGNOSIS — Z23 Encounter for immunization: Secondary | ICD-10-CM | POA: Diagnosis not present

## 2022-08-22 LAB — POCT URINALYSIS DIPSTICK OB
Bilirubin, UA: NEGATIVE
Blood, UA: NEGATIVE
Glucose, UA: NEGATIVE
Ketones, UA: NEGATIVE
Nitrite, UA: NEGATIVE
POC,PROTEIN,UA: NEGATIVE
Spec Grav, UA: 1.015 (ref 1.010–1.025)
Urobilinogen, UA: 0.2 E.U./dL
pH, UA: 6 (ref 5.0–8.0)

## 2022-08-22 NOTE — Progress Notes (Signed)
ROB. Patient states fetal movement. She has a follow-up ultrasound today with MFM. Patient received TDAP injection and signed BTC today. GCT will be rescheduled, ate chocolate right before visit. Patient states no questions or concerns at this time.

## 2022-08-22 NOTE — Progress Notes (Signed)
ROB: No complaints.  Normal fetal echo.  Patient scheduled 1 hour GCT for Friday.  Reports daily fetal movement.

## 2022-08-23 ENCOUNTER — Other Ambulatory Visit: Payer: Self-pay

## 2022-08-23 ENCOUNTER — Ambulatory Visit: Payer: Medicaid Other

## 2022-08-23 ENCOUNTER — Encounter: Payer: Self-pay | Admitting: Obstetrics and Gynecology

## 2022-08-23 ENCOUNTER — Other Ambulatory Visit: Payer: Self-pay | Admitting: *Deleted

## 2022-08-23 DIAGNOSIS — Z113 Encounter for screening for infections with a predominantly sexual mode of transmission: Secondary | ICD-10-CM

## 2022-08-23 DIAGNOSIS — O365990 Maternal care for other known or suspected poor fetal growth, unspecified trimester, not applicable or unspecified: Secondary | ICD-10-CM

## 2022-08-23 DIAGNOSIS — Z3A28 28 weeks gestation of pregnancy: Secondary | ICD-10-CM

## 2022-08-23 DIAGNOSIS — Z131 Encounter for screening for diabetes mellitus: Secondary | ICD-10-CM

## 2022-08-23 DIAGNOSIS — O0993 Supervision of high risk pregnancy, unspecified, third trimester: Secondary | ICD-10-CM

## 2022-08-24 ENCOUNTER — Other Ambulatory Visit: Payer: Medicaid Other

## 2022-08-24 DIAGNOSIS — Z131 Encounter for screening for diabetes mellitus: Secondary | ICD-10-CM

## 2022-08-24 DIAGNOSIS — Z113 Encounter for screening for infections with a predominantly sexual mode of transmission: Secondary | ICD-10-CM

## 2022-08-24 DIAGNOSIS — O0993 Supervision of high risk pregnancy, unspecified, third trimester: Secondary | ICD-10-CM

## 2022-08-24 DIAGNOSIS — Z3A28 28 weeks gestation of pregnancy: Secondary | ICD-10-CM

## 2022-08-25 LAB — 28 WEEK RH+PANEL
Basophils Absolute: 0.1 10*3/uL (ref 0.0–0.2)
Basos: 1 %
EOS (ABSOLUTE): 0.2 10*3/uL (ref 0.0–0.4)
Eos: 3 %
Gestational Diabetes Screen: 59 mg/dL — ABNORMAL LOW (ref 70–139)
HIV Screen 4th Generation wRfx: NONREACTIVE
Hematocrit: 32.9 % — ABNORMAL LOW (ref 34.0–46.6)
Hemoglobin: 11 g/dL — ABNORMAL LOW (ref 11.1–15.9)
Immature Grans (Abs): 0 10*3/uL (ref 0.0–0.1)
Immature Granulocytes: 0 %
Lymphocytes Absolute: 2.1 10*3/uL (ref 0.7–3.1)
Lymphs: 25 %
MCH: 29.1 pg (ref 26.6–33.0)
MCHC: 33.4 g/dL (ref 31.5–35.7)
MCV: 87 fL (ref 79–97)
Monocytes Absolute: 0.6 10*3/uL (ref 0.1–0.9)
Monocytes: 7 %
Neutrophils Absolute: 5.7 10*3/uL (ref 1.4–7.0)
Neutrophils: 64 %
Platelets: 260 10*3/uL (ref 150–450)
RBC: 3.78 x10E6/uL (ref 3.77–5.28)
RDW: 12.8 % (ref 11.7–15.4)
RPR Ser Ql: NONREACTIVE
WBC: 8.7 10*3/uL (ref 3.4–10.8)

## 2022-08-27 NOTE — L&D Delivery Note (Signed)
Delivery Note At  1637,a viable female was delivered vaginally  in a short second stage.via  (Presentation:   OA to LOP   ).  APGAR:8 ,9 ; weight pending .  The fetal head delivered easily in several pushes, and the baby "corkscrewed from OA to LOP.Easy delivery of both shoulders. Spontaeous respirations noted and HR >100, the baby was placed on the maternal abdomen for drying and bonding. Placenta status: delivered intact at ,1644  .  Cord: 3 vessel  with the following complications: none .  Epidural Intact perineum  Lacerations:  tiny bilateral labial lacerations - no suturing required Est. Blood Loss (mL):  100  Mom to postpartum.  Baby to Couplet care / Skin to Skin.  Imagene Riches 11/01/2022, 4:55 PM

## 2022-08-29 ENCOUNTER — Ambulatory Visit: Payer: Medicaid Other | Admitting: *Deleted

## 2022-08-29 ENCOUNTER — Ambulatory Visit: Payer: Medicaid Other | Attending: Obstetrics and Gynecology | Admitting: *Deleted

## 2022-08-29 ENCOUNTER — Encounter: Payer: Self-pay | Admitting: *Deleted

## 2022-08-29 VITALS — BP 107/65 | HR 95

## 2022-08-29 DIAGNOSIS — Z3A28 28 weeks gestation of pregnancy: Secondary | ICD-10-CM | POA: Insufficient documentation

## 2022-08-29 DIAGNOSIS — O99343 Other mental disorders complicating pregnancy, third trimester: Secondary | ICD-10-CM | POA: Insufficient documentation

## 2022-08-29 DIAGNOSIS — O365990 Maternal care for other known or suspected poor fetal growth, unspecified trimester, not applicable or unspecified: Secondary | ICD-10-CM

## 2022-08-29 DIAGNOSIS — F1210 Cannabis abuse, uncomplicated: Secondary | ICD-10-CM | POA: Insufficient documentation

## 2022-08-29 DIAGNOSIS — O99213 Obesity complicating pregnancy, third trimester: Secondary | ICD-10-CM | POA: Diagnosis not present

## 2022-08-29 DIAGNOSIS — O365930 Maternal care for other known or suspected poor fetal growth, third trimester, not applicable or unspecified: Secondary | ICD-10-CM | POA: Diagnosis not present

## 2022-08-29 DIAGNOSIS — Z3480 Encounter for supervision of other normal pregnancy, unspecified trimester: Secondary | ICD-10-CM

## 2022-08-29 NOTE — Procedures (Signed)
Melissa [name] 08/21/1996 [redacted]w[redacted]d  Fetus A Non-Stress Test Interpretation for 08/29/22  Indication:  FGR  Fetal Heart Rate A Mode: External Baseline Rate (A): 150 bpm Variability: Minimal Accelerations: 10 x 10 Decelerations: Variable Multiple birth?: No  Uterine Activity Mode: Toco Contraction Frequency (min): none Resting Tone Palpated: Relaxed  Interpretation (Fetal Testing) Nonstress Test Interpretation: Reactive Overall Impression: Reassuring for gestational age Comments: Tracing reveiwed by Dr. Donalee Citrin

## 2022-09-05 ENCOUNTER — Ambulatory Visit: Payer: Medicaid Other | Admitting: *Deleted

## 2022-09-05 ENCOUNTER — Ambulatory Visit: Payer: Medicaid Other | Attending: Obstetrics and Gynecology

## 2022-09-05 ENCOUNTER — Ambulatory Visit (HOSPITAL_BASED_OUTPATIENT_CLINIC_OR_DEPARTMENT_OTHER): Payer: Medicaid Other | Admitting: *Deleted

## 2022-09-05 VITALS — BP 109/63 | HR 60

## 2022-09-05 DIAGNOSIS — O09293 Supervision of pregnancy with other poor reproductive or obstetric history, third trimester: Secondary | ICD-10-CM | POA: Diagnosis not present

## 2022-09-05 DIAGNOSIS — O99213 Obesity complicating pregnancy, third trimester: Secondary | ICD-10-CM | POA: Insufficient documentation

## 2022-09-05 DIAGNOSIS — Z3A29 29 weeks gestation of pregnancy: Secondary | ICD-10-CM | POA: Diagnosis present

## 2022-09-05 DIAGNOSIS — O99343 Other mental disorders complicating pregnancy, third trimester: Secondary | ICD-10-CM | POA: Diagnosis not present

## 2022-09-05 DIAGNOSIS — O365990 Maternal care for other known or suspected poor fetal growth, unspecified trimester, not applicable or unspecified: Secondary | ICD-10-CM

## 2022-09-05 DIAGNOSIS — F419 Anxiety disorder, unspecified: Secondary | ICD-10-CM

## 2022-09-05 DIAGNOSIS — F259 Schizoaffective disorder, unspecified: Secondary | ICD-10-CM

## 2022-09-05 DIAGNOSIS — O365930 Maternal care for other known or suspected poor fetal growth, third trimester, not applicable or unspecified: Secondary | ICD-10-CM | POA: Diagnosis present

## 2022-09-05 DIAGNOSIS — O99323 Drug use complicating pregnancy, third trimester: Secondary | ICD-10-CM

## 2022-09-05 DIAGNOSIS — F1290 Cannabis use, unspecified, uncomplicated: Secondary | ICD-10-CM

## 2022-09-05 NOTE — Procedures (Signed)
Melissa [name] Jan 20, 1996 [redacted]w[redacted]d  Fetus A Non-Stress Test Interpretation for 09/05/22  Indication: IUGR  Fetal Heart Rate A Mode: External Baseline Rate (A): 145 bpm Variability: Moderate Accelerations: 15 x 15 Decelerations: None Multiple birth?: No  Uterine Activity Mode: Toco Contraction Frequency (min): none Resting Tone Palpated: Relaxed  Interpretation (Fetal Testing) Nonstress Test Interpretation: Reactive Overall Impression: Reassuring for gestational age Comments: tracing reviewed by Dr. Annamaria Boots

## 2022-09-11 ENCOUNTER — Ambulatory Visit: Payer: Medicaid Other | Attending: Obstetrics and Gynecology

## 2022-09-11 ENCOUNTER — Ambulatory Visit: Payer: Medicaid Other | Admitting: *Deleted

## 2022-09-11 ENCOUNTER — Other Ambulatory Visit: Payer: Self-pay | Admitting: *Deleted

## 2022-09-11 ENCOUNTER — Ambulatory Visit (HOSPITAL_BASED_OUTPATIENT_CLINIC_OR_DEPARTMENT_OTHER): Payer: Medicaid Other | Admitting: *Deleted

## 2022-09-11 VITALS — BP 123/67 | HR 90

## 2022-09-11 DIAGNOSIS — Z362 Encounter for other antenatal screening follow-up: Secondary | ICD-10-CM | POA: Diagnosis not present

## 2022-09-11 DIAGNOSIS — O365930 Maternal care for other known or suspected poor fetal growth, third trimester, not applicable or unspecified: Secondary | ICD-10-CM

## 2022-09-11 DIAGNOSIS — F209 Schizophrenia, unspecified: Secondary | ICD-10-CM

## 2022-09-11 DIAGNOSIS — O99343 Other mental disorders complicating pregnancy, third trimester: Secondary | ICD-10-CM | POA: Diagnosis not present

## 2022-09-11 DIAGNOSIS — O365990 Maternal care for other known or suspected poor fetal growth, unspecified trimester, not applicable or unspecified: Secondary | ICD-10-CM

## 2022-09-11 DIAGNOSIS — F1290 Cannabis use, unspecified, uncomplicated: Secondary | ICD-10-CM

## 2022-09-11 DIAGNOSIS — Z3A30 30 weeks gestation of pregnancy: Secondary | ICD-10-CM

## 2022-09-11 DIAGNOSIS — O09293 Supervision of pregnancy with other poor reproductive or obstetric history, third trimester: Secondary | ICD-10-CM

## 2022-09-11 DIAGNOSIS — F419 Anxiety disorder, unspecified: Secondary | ICD-10-CM

## 2022-09-11 DIAGNOSIS — O99323 Drug use complicating pregnancy, third trimester: Secondary | ICD-10-CM

## 2022-09-11 NOTE — Procedures (Signed)
Melissa [name] Sep 17, 1995 [redacted]w[redacted]d  Fetus A Non-Stress Test Interpretation for 09/11/22  Indication: IUGR  Fetal Heart Rate A Mode: External Baseline Rate (A): 150 bpm Variability: Minimal, Moderate Accelerations: 10 x 10 Decelerations: Variable Multiple birth?: No  Uterine Activity Mode: Palpation, Toco Contraction Frequency (min): none Resting Tone Palpated: Relaxed  Interpretation (Fetal Testing) Nonstress Test Interpretation: Reactive Overall Impression: Reassuring for gestational age Comments: Dr. Annamaria Boots reviewed tracing

## 2022-09-12 ENCOUNTER — Ambulatory Visit (INDEPENDENT_AMBULATORY_CARE_PROVIDER_SITE_OTHER): Payer: Medicaid Other | Admitting: Obstetrics

## 2022-09-12 VITALS — BP 94/63 | HR 94 | Wt 191.0 lb

## 2022-09-12 DIAGNOSIS — Z3A30 30 weeks gestation of pregnancy: Secondary | ICD-10-CM

## 2022-09-12 DIAGNOSIS — Z3480 Encounter for supervision of other normal pregnancy, unspecified trimester: Secondary | ICD-10-CM

## 2022-09-12 DIAGNOSIS — Z3483 Encounter for supervision of other normal pregnancy, third trimester: Secondary | ICD-10-CM
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
## 2022-09-19 ENCOUNTER — Other Ambulatory Visit: Payer: Medicaid Other

## 2022-09-19 ENCOUNTER — Ambulatory Visit: Payer: Medicaid Other

## 2022-09-20 ENCOUNTER — Ambulatory Visit: Payer: Medicaid Other | Attending: Obstetrics

## 2022-09-20 ENCOUNTER — Other Ambulatory Visit: Payer: Self-pay | Admitting: Obstetrics

## 2022-09-20 ENCOUNTER — Ambulatory Visit: Payer: Medicaid Other | Admitting: *Deleted

## 2022-09-20 ENCOUNTER — Ambulatory Visit: Payer: Medicaid Other

## 2022-09-20 VITALS — BP 138/77 | HR 86

## 2022-09-20 DIAGNOSIS — O09293 Supervision of pregnancy with other poor reproductive or obstetric history, third trimester: Secondary | ICD-10-CM

## 2022-09-20 DIAGNOSIS — O99343 Other mental disorders complicating pregnancy, third trimester: Secondary | ICD-10-CM | POA: Diagnosis not present

## 2022-09-20 DIAGNOSIS — F419 Anxiety disorder, unspecified: Secondary | ICD-10-CM

## 2022-09-20 DIAGNOSIS — Z3A32 32 weeks gestation of pregnancy: Secondary | ICD-10-CM

## 2022-09-20 DIAGNOSIS — O99323 Drug use complicating pregnancy, third trimester: Secondary | ICD-10-CM

## 2022-09-20 DIAGNOSIS — F1290 Cannabis use, unspecified, uncomplicated: Secondary | ICD-10-CM

## 2022-09-20 DIAGNOSIS — O365930 Maternal care for other known or suspected poor fetal growth, third trimester, not applicable or unspecified: Secondary | ICD-10-CM | POA: Insufficient documentation

## 2022-09-20 DIAGNOSIS — F259 Schizoaffective disorder, unspecified: Secondary | ICD-10-CM

## 2022-09-26 ENCOUNTER — Ambulatory Visit (INDEPENDENT_AMBULATORY_CARE_PROVIDER_SITE_OTHER): Payer: Medicaid Other | Admitting: Obstetrics

## 2022-09-26 ENCOUNTER — Encounter: Payer: Self-pay | Admitting: Obstetrics

## 2022-09-26 VITALS — BP 115/81 | HR 81

## 2022-09-26 DIAGNOSIS — Z3A32 32 weeks gestation of pregnancy: Secondary | ICD-10-CM

## 2022-09-26 DIAGNOSIS — Z3480 Encounter for supervision of other normal pregnancy, unspecified trimester: Secondary | ICD-10-CM

## 2022-09-26 DIAGNOSIS — Z3483 Encounter for supervision of other normal pregnancy, third trimester: Secondary | ICD-10-CM

## 2022-09-26 NOTE — Progress Notes (Signed)
ROB at [redacted]w[redacted]d. Active baby. Denies ctx, LOF, and vaginal bleeding. Melissa [name] is feeling well. She is sleeping a lot. She is getting things ready for the baby - still needs car seat and a few clothing items. Normal fetal echo with [facility]. Has weekly BPPs scheduled with MFM for IUGR based on AC<7%ile (EFW 13%ile). Link to free CBE sent via [place]. RTC in 2 weeks.  Lurlean Horns, CNM

## 2022-09-27 ENCOUNTER — Ambulatory Visit: Payer: Medicaid Other | Attending: Obstetrics

## 2022-09-27 ENCOUNTER — Ambulatory Visit: Payer: Medicaid Other | Admitting: *Deleted

## 2022-09-27 VITALS — BP 131/78 | HR 86

## 2022-09-27 DIAGNOSIS — O99343 Other mental disorders complicating pregnancy, third trimester: Secondary | ICD-10-CM | POA: Diagnosis not present

## 2022-09-27 DIAGNOSIS — O99323 Drug use complicating pregnancy, third trimester: Secondary | ICD-10-CM

## 2022-09-27 DIAGNOSIS — Z3A33 33 weeks gestation of pregnancy: Secondary | ICD-10-CM

## 2022-09-27 DIAGNOSIS — O365930 Maternal care for other known or suspected poor fetal growth, third trimester, not applicable or unspecified: Secondary | ICD-10-CM

## 2022-09-27 DIAGNOSIS — O09293 Supervision of pregnancy with other poor reproductive or obstetric history, third trimester: Secondary | ICD-10-CM

## 2022-09-27 DIAGNOSIS — F259 Schizoaffective disorder, unspecified: Secondary | ICD-10-CM

## 2022-09-27 DIAGNOSIS — F419 Anxiety disorder, unspecified: Secondary | ICD-10-CM | POA: Diagnosis not present

## 2022-09-27 DIAGNOSIS — F1290 Cannabis use, unspecified, uncomplicated: Secondary | ICD-10-CM

## 2022-10-03 ENCOUNTER — Ambulatory Visit: Payer: Medicaid Other

## 2022-10-03 ENCOUNTER — Other Ambulatory Visit: Payer: Self-pay

## 2022-10-03 ENCOUNTER — Ambulatory Visit: Payer: Medicaid Other | Attending: Obstetrics

## 2022-10-03 VITALS — BP 119/78 | HR 89

## 2022-10-03 DIAGNOSIS — O09293 Supervision of pregnancy with other poor reproductive or obstetric history, third trimester: Secondary | ICD-10-CM

## 2022-10-03 DIAGNOSIS — F419 Anxiety disorder, unspecified: Secondary | ICD-10-CM | POA: Diagnosis not present

## 2022-10-03 DIAGNOSIS — O365930 Maternal care for other known or suspected poor fetal growth, third trimester, not applicable or unspecified: Secondary | ICD-10-CM

## 2022-10-03 DIAGNOSIS — F259 Schizoaffective disorder, unspecified: Secondary | ICD-10-CM | POA: Diagnosis not present

## 2022-10-03 DIAGNOSIS — Z3480 Encounter for supervision of other normal pregnancy, unspecified trimester: Secondary | ICD-10-CM | POA: Insufficient documentation

## 2022-10-03 DIAGNOSIS — O99323 Drug use complicating pregnancy, third trimester: Secondary | ICD-10-CM | POA: Diagnosis not present

## 2022-10-03 DIAGNOSIS — Z8279 Family history of other congenital malformations, deformations and chromosomal abnormalities: Secondary | ICD-10-CM

## 2022-10-03 DIAGNOSIS — F1210 Cannabis abuse, uncomplicated: Secondary | ICD-10-CM

## 2022-10-03 DIAGNOSIS — O99343 Other mental disorders complicating pregnancy, third trimester: Secondary | ICD-10-CM | POA: Diagnosis not present

## 2022-10-03 DIAGNOSIS — F1290 Cannabis use, unspecified, uncomplicated: Secondary | ICD-10-CM

## 2022-10-03 DIAGNOSIS — Z3A33 33 weeks gestation of pregnancy: Secondary | ICD-10-CM | POA: Diagnosis not present

## 2022-10-03 DIAGNOSIS — O99213 Obesity complicating pregnancy, third trimester: Secondary | ICD-10-CM

## 2022-10-04 ENCOUNTER — Ambulatory Visit: Admit: 2022-10-04 | Payer: PRIVATE HEALTH INSURANCE | Attending: Internal Medicine | Primary: Internal Medicine

## 2022-10-04 DIAGNOSIS — R195 Other fecal abnormalities: Secondary | ICD-10-CM

## 2022-10-04 MED ORDER — SENNA-DOCUSATE SODIUM 8.6-50 MG PO TABS
ORAL_TABLET | Freq: Every day | ORAL | 1 refills | Status: AC
Start: 2022-10-04 — End: ?

## 2022-10-04 MED ORDER — BUSPIRONE HCL 5 MG PO TABS
5 MG | ORAL_TABLET | Freq: Two times a day (BID) | ORAL | 2 refills | Status: AC
Start: 2022-10-04 — End: 2023-01-02

## 2022-10-04 MED ORDER — POLYETHYLENE GLYCOL 3350 17 GM/SCOOP PO POWD
17 | ORAL | 3 refills | Status: AC
Start: 2022-10-04 — End: ?

## 2022-10-04 NOTE — Progress Notes (Addendum)
Nursing staff confirmed patient with full name and DOB. Prepared patient for visit today by obtaining vitals, verifying medication list and allergies, and briefly discussing reason for visit.     Chief Complaint   Patient presents with    Follow-Up from Hospital    Emesis    Blood Loss     Abdominal Pain           1. "Have you been to the ER, urgent care clinic since your last visit?  Hospitalized since your last visit?" YES    2. "Have you seen or consulted any other health care providers outside of the [facility] since your last visit?" [facility]      5. For patients aged 21-65: Has the patient had a pap smear? yes

## 2022-10-04 NOTE — Progress Notes (Signed)
Subjective:      Patient ID: Anna Frazier is a 27 y.o. female here for follow-up.  She is accompanied by her mom.  She was recently admitted to [facility] with a very low hemoglobin.  Hemoglobin was 6.9.  She had received 1 unit of PRBC, still blood was positive and had both endoscopy colonoscopy done which were negative for any active bleeding.  She mentioned that she did not have any menorrhagia, her menstrual flow is normal.  She is not sure why she is anemic.  Previous blood work reviewed, she did not have anemia a few years back.  Upon asking she is not eating much iron rich diet.  She has been constipated for past several months.  She is taking Protonix.  Colonoscopy endoscopy clear without any bleeding, I am not sure if she needs Protonix.  Have appointment to see a GI specialist.  She is very anxious about her health condition.  She is tearful and sobbing.    Emesis   Associated symptoms include abdominal pain.   Abdominal Pain  Associated symptoms include vomiting.       Review of Systems   Constitutional: Negative.    Eyes: Negative.    Respiratory: Negative.     Cardiovascular: Negative.    Gastrointestinal:  Positive for abdominal pain and vomiting.   Endocrine: Negative.    Genitourinary: Negative.    Musculoskeletal: Negative.    Neurological: Negative.    Hematological: Negative.    Psychiatric/Behavioral: Negative.         Objective:   Physical Exam  Constitutional:       Appearance: Normal appearance.   Cardiovascular:      Rate and Rhythm: Normal rate and regular rhythm.      Pulses: Normal pulses.      Heart sounds: Normal heart sounds.   Pulmonary:      Effort: Pulmonary effort is normal.      Breath sounds: Normal breath sounds.   Abdominal:      General: Abdomen is flat. Bowel sounds are normal.      Palpations: Abdomen is soft.   Musculoskeletal:         General: Normal range of motion.      Cervical back: Normal range of motion and neck supple.   Skin:     General: Skin is  warm.   Neurological:      General: No focal deficit present.      Mental Status: She is alert and oriented to person, place, and time. Mental status is at baseline.   Psychiatric:         Behavior: Behavior normal.         Thought Content: Thought content normal.      Comments: Very anxious.         Assessment / Plan:      Diagnoses and all orders for this visit:  Occult blood in stools    She had endoscopy and colonoscopy done in [facility] and showed no active bleeding.    She has been very constipated now.  Will give,  -     polyethylene glycol (GLYCOLAX) 17 GM/SCOOP powder; 1 pack in 8 oz on water a day  -     [facility] - Abou-Assi, Souheil, MD, Gastroenterology, [place] ([address])  Anemia, unspecified type    Hemoglobin was 6.9.  She had received 1 unit of PRBC.  She mentioned she did not have menorrhagia, occult blood  and stool was positive but colonoscopy endoscopy clear.  Need to see an heme oncologist to find out the cause for anemia.  Will check,  -     CBC with Auto Differential  -     Iron and TIBC  -     Ferritin  -     Reticulocytes  -     Lactate Dehydrogenase  -     Vitamin B12  -     [facility] - Kristopher Oppenheim, MD, Hematology/Oncology, [place] ([address])  Constipation, unspecified constipation type    High-fiber diet.  Need to drink more fluid and fiber.  Will start her on MiraLAX and senna soft.  -     Comprehensive Metabolic Panel  -     TSH  -     sennosides-docusate sodium (SENOKOT-S) 8.6-50 MG tablet; Take 1 tablet by mouth daily  -     [facility] - Abou-Assi, Souheil, MD, Gastroenterology, [place] ([address])  Anxiety    Very anxious person.  Will give,  -     busPIRone (BUSPAR) 5 MG tablet; Take 1 tablet by mouth 2 times daily    Follow-up in a month.    Discussed expected course/resolution/complications of diagnosis in detail with patient.   Medication risks/benefits/costs/interactions/alternatives discussed with patient.   Pt was given an after visit summary which includes diagnoses,  current medications & vitals.   Pt expressed understanding with the diagnosis and plan.

## 2022-10-05 LAB — CBC WITH AUTO DIFFERENTIAL
Basophils %: 1 %
Basophils Absolute: 0.1 10*3/uL (ref 0.0–0.2)
Eosinophils %: 1 %
Eosinophils Absolute: 0.1 10*3/uL (ref 0.0–0.4)
Hematocrit: 32.8 % — ABNORMAL LOW (ref 34.0–46.6)
Hemoglobin: 9.4 g/dL — ABNORMAL LOW (ref 11.1–15.9)
Immature Grans (Abs): 0 10*3/uL (ref 0.0–0.1)
Immature Granulocytes: 0 %
Lymphocytes %: 31 %
Lymphocytes Absolute: 2.4 10*3/uL (ref 0.7–3.1)
MCH: 20.9 pg — ABNORMAL LOW (ref 26.6–33.0)
MCHC: 28.7 g/dL — ABNORMAL LOW (ref 31.5–35.7)
MCV: 73 fL — ABNORMAL LOW (ref 79–97)
Monocytes %: 8 %
Monocytes Absolute: 0.7 10*3/uL (ref 0.1–0.9)
Neutrophils %: 59 %
Neutrophils Absolute: 4.6 10*3/uL (ref 1.4–7.0)
Platelets: 547 10*3/uL — ABNORMAL HIGH (ref 150–450)
RBC: 4.5 x10E6/uL (ref 3.77–5.28)
RDW: 20.4 % — ABNORMAL HIGH (ref 11.7–15.4)
WBC: 7.8 10*3/uL (ref 3.4–10.8)

## 2022-10-05 LAB — COMPREHENSIVE METABOLIC PANEL
ALT: 15 IU/L (ref 0–32)
AST: 14 IU/L (ref 0–40)
Albumin/Globulin Ratio: 1.1 — ABNORMAL LOW (ref 1.2–2.2)
Albumin: 4 g/dL (ref 4.0–5.0)
Alkaline Phosphatase: 74 IU/L (ref 44–121)
BUN/Creatinine Ratio: 10 (ref 9–23)
BUN: 8 mg/dL (ref 6–20)
CO2: 22 mmol/L (ref 20–29)
Calcium: 8.9 mg/dL (ref 8.7–10.2)
Chloride: 106 mmol/L (ref 96–106)
Creatinine: 0.83 mg/dL (ref 0.57–1.00)
Est, Glom Filt Rate: 98 mL/min/{1.73_m2} (ref >59)
Globulin, Total: 3.6 g/dL (ref 1.5–4.5)
Glucose: 85 mg/dL (ref 70–99)
Potassium: 4.7 mmol/L (ref 3.5–5.2)
Sodium: 142 mmol/L (ref 134–144)
Total Bilirubin: <0.2 mg/dL (ref 0.0–1.2)
Total Protein: 7.6 g/dL (ref 6.0–8.5)

## 2022-10-05 LAB — TSH: TSH: 0.486 u[IU]/mL (ref 0.450–4.500)

## 2022-10-05 LAB — RETICULOCYTES: Retic Ct Pct: 0.6 % (ref 0.6–2.6)

## 2022-10-05 LAB — IRON AND TIBC
Iron % Saturation: 3 % — CL (ref 15–55)
Iron: 13 ug/dL — ABNORMAL LOW (ref 27–159)
TIBC: 451 ug/dL — ABNORMAL HIGH (ref 250–450)
UIBC: 438 ug/dL — ABNORMAL HIGH (ref 131–425)

## 2022-10-05 LAB — LACTATE DEHYDROGENASE: LD: 155 IU/L (ref 119–226)

## 2022-10-05 LAB — VITAMIN B12: Vitamin B-12: 216 pg/mL — ABNORMAL LOW (ref 232–1245)

## 2022-10-05 LAB — FERRITIN: Ferritin: 17 ng/mL (ref 15–150)

## 2022-10-07 ENCOUNTER — Inpatient Hospital Stay
Admit: 2022-10-07 | Discharge: 2022-10-07 | Disposition: A | Discharge status: Home / Self Care | Payer: PRIVATE HEALTH INSURANCE | Attending: Emergency Medicine

## 2022-10-07 DIAGNOSIS — D500 Iron deficiency anemia secondary to blood loss (chronic): Secondary | ICD-10-CM

## 2022-10-07 LAB — PROTIME-INR
INR: 1.1 (ref 0.9–1.1)
Protime: 10.8 s (ref 9.0–11.1)

## 2022-10-07 LAB — CBC WITH AUTO DIFFERENTIAL
Absolute Immature Granulocyte: 0 10*3/uL (ref 0.00–0.04)
Basophils %: 0 % (ref 0–1)
Basophils Absolute: 0 10*3/uL (ref 0.0–0.1)
Eosinophils %: 1 % (ref 0–7)
Eosinophils Absolute: 0.1 10*3/uL (ref 0.0–0.4)
Hematocrit: 29.3 % — ABNORMAL LOW (ref 35.0–47.0)
Hemoglobin: 8.8 g/dL — ABNORMAL LOW (ref 11.5–16.0)
Immature Granulocytes: 0 % (ref 0–0.5)
Lymphocytes %: 26 % (ref 12–49)
Lymphocytes Absolute: 2.4 10*3/uL (ref 0.8–3.5)
MCH: 21.7 PG — ABNORMAL LOW (ref 26.0–34.0)
MCHC: 30 g/dL (ref 30.0–36.5)
MCV: 72.3 FL — ABNORMAL LOW (ref 80.0–99.0)
MPV: 8.9 FL (ref 8.9–12.9)
Monocytes %: 11 % (ref 5–13)
Monocytes Absolute: 1 10*3/uL (ref 0.0–1.0)
Neutrophils %: 62 % (ref 32–75)
Neutrophils Absolute: 5.8 10*3/uL (ref 1.8–8.0)
Nucleated RBCs: 0 PER 100 WBC
Platelets: 407 10*3/uL — ABNORMAL HIGH (ref 150–400)
RBC: 4.05 M/uL (ref 3.80–5.20)
RDW: 21.1 % — ABNORMAL HIGH (ref 11.5–14.5)
WBC: 9.3 10*3/uL (ref 3.6–11.0)
nRBC: 0 10*3/uL (ref 0.00–0.01)

## 2022-10-07 LAB — COMPREHENSIVE METABOLIC PANEL
ALT: 16 U/L (ref 12–78)
AST: 20 U/L (ref 15–37)
Albumin/Globulin Ratio: 0.7 — ABNORMAL LOW (ref 1.1–2.2)
Albumin: 2.9 g/dL — ABNORMAL LOW (ref 3.5–5.0)
Alk Phosphatase: 70 U/L (ref 45–117)
Anion Gap: 8 mmol/L (ref 5–15)
BUN: 5 MG/DL — ABNORMAL LOW (ref 6–20)
Bun/Cre Ratio: 6 — ABNORMAL LOW (ref 12–20)
CO2: 26 mmol/L (ref 21–32)
Calcium: 8.2 MG/DL — ABNORMAL LOW (ref 8.5–10.1)
Chloride: 106 mmol/L (ref 97–108)
Creatinine: 0.77 MG/DL (ref 0.55–1.02)
Est, Glom Filt Rate: >60 mL/min/{1.73_m2} (ref >60)
Globulin: 4.2 g/dL — ABNORMAL HIGH (ref 2.0–4.0)
Glucose: 91 mg/dL (ref 65–100)
Potassium: 3.6 mmol/L (ref 3.5–5.1)
Sodium: 140 mmol/L (ref 136–145)
Total Bilirubin: 0.3 MG/DL (ref 0.2–1.0)
Total Protein: 7.1 g/dL (ref 6.4–8.2)

## 2022-10-07 MED ORDER — FERROUS SULFATE 325 (65 FE) MG PO TABS
32565 (65 Fe) MG | ORAL_TABLET | Freq: Two times a day (BID) | ORAL | 0 refills | Status: AC
Start: 2022-10-07 — End: 2022-11-02

## 2022-10-07 MED ORDER — DOCUSATE SODIUM 100 MG PO CAPS
100 MG | ORAL_CAPSULE | Freq: Two times a day (BID) | ORAL | 0 refills | Status: AC
Start: 2022-10-07 — End: 2022-11-06

## 2022-10-07 MED ORDER — VITAMIN C 500 MG PO TABS
500 | ORAL_TABLET | Freq: Every day | ORAL | 3 refills | Status: AC
Start: 2022-10-07 — End: ?

## 2022-10-07 NOTE — ED Provider Notes (Incomplete)
[facility] EMERGENCY DEPT  EMERGENCY DEPARTMENT ENCOUNTER      Pt Name: Anna Frazier  MRN: [id number]  Birthdate [birth year]  Date of evaluation: 10/07/2022  Provider: Unice Cobble, MD    CHIEF COMPLAINT       Chief Complaint   Patient presents with    Hematemesis         HISTORY OF PRESENT ILLNESS   (Location/Symptom, Timing/Onset, Context/Setting, Quality, Duration, Modifying Factors, Severity)  Note limiting factors.   Pt presents to ED with c/o one black tarry stool today and one episode of hematemesis. Pt had recent Endoscopy and colonoscopy at [facility] last week. Pt was discharged on 10/01/2022. VS stable.  In NAD            Review of External Medical Records:     Nursing Notes were reviewed.    REVIEW OF SYSTEMS    (2-9 systems for level 4, 10 or more for level 5)     Review of Systems    Except as noted above the remainder of the review of systems was reviewed and negative.       PAST MEDICAL HISTORY     Past Medical History:   Diagnosis Date    Asthma     Asthma     Acute since childhood    Contraception     Nexplanon left arm    Depression     Obesity          SURGICAL HISTORY     History reviewed. No pertinent surgical history.      CURRENT MEDICATIONS       Previous Medications    ALBUTEROL SULFATE HFA (PROVENTIL;VENTOLIN;PROAIR) 108 (90 BASE) MCG/ACT INHALER    Inhale 2 puffs into the lungs every 6 hours as needed    BUSPIRONE (BUSPAR) 5 MG TABLET    Take 1 tablet by mouth 2 times daily    METHOCARBAMOL (ROBAXIN) 500 MG TABLET    Take 500 mg by mouth    PANTOPRAZOLE (PROTONIX) 40 MG TABLET    Take 1 tablet by mouth daily as needed    POLYETHYLENE GLYCOL (GLYCOLAX) 17 GM/SCOOP POWDER    1 pack in 8 oz on water a day    SENNOSIDES-DOCUSATE SODIUM (SENOKOT-S) 8.6-50 MG TABLET    Take 1 tablet by mouth daily       ALLERGIES     Patient has no known allergies.    FAMILY HISTORY       Family History   Problem Relation Age of Onset    Cancer Maternal Grandmother 71        Breast and liver CA    Diabetes  Other     Hypertension Other     Diabetes Mother     Stroke Paternal Grandmother     Heart Disease Maternal Grandmother     Heart Disease Mother         SVT, ablation    High Cholesterol Mother     Other Mother         Fibromyalgia    Heart Disease Paternal Grandfather     Hypertension Mother           SOCIAL HISTORY       Social History     Socioeconomic History    Marital status: Single     Spouse name: None    Number of children: None    Years of education: None    Highest education level: None  Tobacco Use    Smoking status: Every Day     Types: Cigars    Smokeless tobacco: Former    Tobacco comments:     Quit smoking: Black and milds 1-2 a day cigar   Substance and Sexual Activity    Alcohol use: Yes     Alcohol/week: 1.0 standard drink of alcohol     Types: 1 Shots of liquor per week    Drug use: Yes     Types: Marijuana Sheran Fava)   Social History Narrative    ** Merged History Encounter **          Social Determinants of Health     Financial Resource Strain: Low Risk  (10/04/2022)    Overall Financial Resource Strain (CARDIA)     Difficulty of Paying Living Expenses: Not hard at all   Food Insecurity: No Food Insecurity (10/04/2022)    Hunger Vital Sign     Worried About Running Out of Food in the Last Year: Never true     Ran Out of Food in the Last Year: Never true   Transportation Needs: Unknown (10/04/2022)    PRAPARE - Transportation     Lack of Transportation (Non-Medical): No   Housing Stability: Unknown (10/04/2022)    Housing Stability Vital Sign     Unstable Housing in the Last Year: No           PHYSICAL EXAM    (up to 7 for level 4, 8 or more for level 5)     ED Triage Vitals [10/07/22 1637]   BP Temp Temp Source Pulse Respirations SpO2 Height Weight - Scale   127/70 98.4 F (36.9 C) Oral 87 16 100 % 1.524 m (5') 67 kg (147 lb 11.3 oz)       Body mass index is 28.85 kg/m.    Physical Exam    DIAGNOSTIC RESULTS     EKG: All EKG's are interpreted by the Emergency Department Physician who either signs or  Co-signs this chart in the absence of a cardiologist.        RADIOLOGY:   Non-plain film images such as CT, Ultrasound and MRI are read by the radiologist. Plain radiographic images are visualized and preliminarily interpreted by the emergency physician with the below findings:        Interpretation per the Radiologist below, if available at the time of this note:    No orders to display        LABS:  Labs Reviewed   COMPREHENSIVE METABOLIC PANEL   CBC WITH AUTO DIFFERENTIAL   PROTIME-INR       All other labs were within normal range or not returned as of this dictation.    EMERGENCY DEPARTMENT COURSE and DIFFERENTIAL DIAGNOSIS/MDM:   Vitals:    Vitals:    10/07/22 1637   BP: 127/70   Pulse: 87   Resp: 16   Temp: 98.4 F (36.9 C)   TempSrc: Oral   SpO2: 100%   Weight: 67 kg (147 lb 11.3 oz)   Height: 1.524 m (5')           Medical Decision Making  Amount and/or Complexity of Data Reviewed  Labs: ordered.            REASSESSMENT            CONSULTS:  None    PROCEDURES:  Unless otherwise noted below, none     Procedures      FINAL IMPRESSION  No diagnosis found.      DISPOSITION/PLAN   DISPOSITION        PATIENT REFERRED TO:  No follow-up provider specified.    DISCHARGE MEDICATIONS:  New Prescriptions    No medications on file         (Please note that portions of this note were completed with a voice recognition program.  Efforts were made to edit the dictations but occasionally words are mis-transcribed.)    Unice Cobble, MD (electronically signed)  Emergency Attending Physician / Physician Assistant / Nurse Practitioner

## 2022-10-07 NOTE — ED Triage Notes (Signed)
Pt presents to ED with c/o one black tarry stool today and one episode of hematemesis. Pt had recent Endoscopy and colonoscopy at [facility] last week. Pt was discharged on 10/01/2022. VS stable.  In NAD

## 2022-10-07 NOTE — Discharge Instructions (Signed)
You were seen in the emergency department for blood in your stool.  The results of your tests were reassuring.  Although an exact cause of your symptoms was not identified, the most likely cause is GI bleeding.  Please take any medications prescribed at this visit as instructed.  Please follow-up with your PCP, hematologist, and GI specialist as planned or return to the emergency department if you experience a worsening of symptoms or any new symptoms that are concerning to you.

## 2022-10-08 ENCOUNTER — Encounter

## 2022-10-08 MED ORDER — FERROUS GLUCONATE 324 (38 FE) MG PO TABS
324 | ORAL_TABLET | Freq: Two times a day (BID) | ORAL | 0 refills | Status: AC
Start: 2022-10-08 — End: ?

## 2022-10-08 MED ORDER — CYANOCOBALAMIN 1000 MCG/ML IJ SOLN
1000 | Freq: Once | INTRAMUSCULAR | 3 refills | Status: DC
Start: 2022-10-08 — End: 2022-10-16

## 2022-10-08 NOTE — Telephone Encounter (Signed)
-----   Message from Bradd Canary, MD sent at 10/05/2022  1:45 PM EST -----  Very low iron and B12.  Please start her on iron sulfate 324 mg tablet twice a day for next 3 months.  Need to be on iron rich diet.  Very low B12, need to start B12 1002 IM every 2 weeks for 4 injection and started on oral B12 5 mcg a day.  Low hemoglobin because of low iron, need to see gynecologist if she has menorrhagia.

## 2022-10-08 NOTE — Telephone Encounter (Signed)
RX sent in per providers orders.

## 2022-10-09 NOTE — Telephone Encounter (Signed)
Anna Frazier was called and verbalized understanding on note below.

## 2022-10-10 ENCOUNTER — Ambulatory Visit: Payer: Medicaid Other | Admitting: *Deleted

## 2022-10-10 ENCOUNTER — Ambulatory Visit: Payer: Medicaid Other | Attending: Obstetrics

## 2022-10-10 VITALS — BP 132/86 | HR 87

## 2022-10-10 DIAGNOSIS — O99343 Other mental disorders complicating pregnancy, third trimester: Secondary | ICD-10-CM | POA: Diagnosis not present

## 2022-10-10 DIAGNOSIS — O365930 Maternal care for other known or suspected poor fetal growth, third trimester, not applicable or unspecified: Secondary | ICD-10-CM | POA: Insufficient documentation

## 2022-10-10 DIAGNOSIS — F259 Schizoaffective disorder, unspecified: Secondary | ICD-10-CM

## 2022-10-10 DIAGNOSIS — O99323 Drug use complicating pregnancy, third trimester: Secondary | ICD-10-CM

## 2022-10-10 DIAGNOSIS — O09293 Supervision of pregnancy with other poor reproductive or obstetric history, third trimester: Secondary | ICD-10-CM

## 2022-10-10 DIAGNOSIS — F419 Anxiety disorder, unspecified: Secondary | ICD-10-CM

## 2022-10-10 DIAGNOSIS — Z3A34 34 weeks gestation of pregnancy: Secondary | ICD-10-CM

## 2022-10-10 DIAGNOSIS — F1290 Cannabis use, unspecified, uncomplicated: Secondary | ICD-10-CM

## 2022-10-11 ENCOUNTER — Encounter: Payer: Medicaid Other | Admitting: Advanced Practice Midwife

## 2022-10-16 ENCOUNTER — Telehealth

## 2022-10-16 MED ORDER — CYANOCOBALAMIN 1000 MCG/ML IJ SOLN
1000 | Freq: Once | INTRAMUSCULAR | 3 refills | Status: AC
Start: 2022-10-16 — End: 2022-10-16

## 2022-10-16 NOTE — Telephone Encounter (Signed)
RX resent in.

## 2022-10-16 NOTE — Telephone Encounter (Signed)
Pt states the cyanocobalamin 1047mg was sent to the wrong pharmacy  She would like a new script sent to [facility] [address]    Nurse Appointment 10/19/22

## 2022-10-17 ENCOUNTER — Other Ambulatory Visit (HOSPITAL_COMMUNITY)
Admission: RE | Admit: 2022-10-17 | Discharge: 2022-10-17 | Disposition: A | Discharge status: Home / Self Care | Payer: Medicaid Other | Source: Ambulatory Visit | Attending: Advanced Practice Midwife | Admitting: Advanced Practice Midwife

## 2022-10-17 ENCOUNTER — Ambulatory Visit (INDEPENDENT_AMBULATORY_CARE_PROVIDER_SITE_OTHER): Payer: Medicaid Other | Admitting: Obstetrics

## 2022-10-17 VITALS — BP 111/84 | HR 98 | Wt 195.4 lb

## 2022-10-17 DIAGNOSIS — Z113 Encounter for screening for infections with a predominantly sexual mode of transmission: Secondary | ICD-10-CM

## 2022-10-17 DIAGNOSIS — Z3480 Encounter for supervision of other normal pregnancy, unspecified trimester: Secondary | ICD-10-CM

## 2022-10-17 DIAGNOSIS — Z3483 Encounter for supervision of other normal pregnancy, third trimester: Secondary | ICD-10-CM

## 2022-10-17 DIAGNOSIS — Z3A35 35 weeks gestation of pregnancy: Secondary | ICD-10-CM

## 2022-10-17 DIAGNOSIS — O0993 Supervision of high risk pregnancy, unspecified, third trimester: Secondary | ICD-10-CM

## 2022-10-17 DIAGNOSIS — Z3685 Encounter for antenatal screening for Streptococcus B: Secondary | ICD-10-CM

## 2022-10-17 LAB — POCT URINALYSIS DIPSTICK OB
Bilirubin, UA: NEGATIVE
Blood, UA: NEGATIVE
Glucose, UA: NEGATIVE
Ketones, UA: NEGATIVE
Leukocytes, UA: NEGATIVE — AB
Nitrite, UA: NEGATIVE
Spec Grav, UA: 1.02 (ref 1.010–1.025)
Urobilinogen, UA: 0.2 E.U./dL
pH, UA: 5 (ref 5.0–8.0)
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
## 2022-10-17 NOTE — Addendum Note (Signed)
Addended by: Minette Headland on: 10/17/2022 11:55 AM   Modules accepted: Orders

## 2022-10-18 ENCOUNTER — Ambulatory Visit: Payer: Medicaid Other | Attending: Obstetrics

## 2022-10-18 ENCOUNTER — Ambulatory Visit: Payer: Medicaid Other | Admitting: *Deleted

## 2022-10-18 VITALS — BP 106/57 | HR 79

## 2022-10-18 DIAGNOSIS — Z3A36 36 weeks gestation of pregnancy: Secondary | ICD-10-CM

## 2022-10-18 DIAGNOSIS — F259 Schizoaffective disorder, unspecified: Secondary | ICD-10-CM

## 2022-10-18 DIAGNOSIS — O365930 Maternal care for other known or suspected poor fetal growth, third trimester, not applicable or unspecified: Secondary | ICD-10-CM | POA: Diagnosis not present

## 2022-10-18 DIAGNOSIS — O99323 Drug use complicating pregnancy, third trimester: Secondary | ICD-10-CM

## 2022-10-18 DIAGNOSIS — O99343 Other mental disorders complicating pregnancy, third trimester: Secondary | ICD-10-CM | POA: Diagnosis not present

## 2022-10-18 DIAGNOSIS — O99213 Obesity complicating pregnancy, third trimester: Secondary | ICD-10-CM | POA: Diagnosis present

## 2022-10-18 DIAGNOSIS — F1210 Cannabis abuse, uncomplicated: Secondary | ICD-10-CM | POA: Diagnosis present

## 2022-10-18 DIAGNOSIS — E669 Obesity, unspecified: Secondary | ICD-10-CM

## 2022-10-18 DIAGNOSIS — F419 Anxiety disorder, unspecified: Secondary | ICD-10-CM | POA: Diagnosis not present

## 2022-10-18 DIAGNOSIS — Z8279 Family history of other congenital malformations, deformations and chromosomal abnormalities: Secondary | ICD-10-CM | POA: Insufficient documentation

## 2022-10-18 LAB — CERVICOVAGINAL ANCILLARY ONLY
Chlamydia: NEGATIVE
Comment: NEGATIVE
Comment: NORMAL
Neisseria Gonorrhea: NEGATIVE

## 2022-10-19 ENCOUNTER — Ambulatory Visit
Admit: 2022-10-19 | Discharge: 2022-10-19 | Payer: PRIVATE HEALTH INSURANCE | Attending: Internal Medicine | Primary: Internal Medicine

## 2022-10-19 DIAGNOSIS — E538 Deficiency of other specified B group vitamins: Secondary | ICD-10-CM

## 2022-10-19 MED ORDER — CYANOCOBALAMIN 1000 MCG/ML IJ SOLN
1000 | Freq: Once | INTRAMUSCULAR | Status: AC
Start: 2022-10-19 — End: 2022-10-19
  Administered 2022-10-19: 20:00:00 1000 ug via INTRAMUSCULAR

## 2022-10-19 NOTE — Progress Notes (Signed)
Patient brought in 21 vial from the pharmacy (that had been ordered by Bradd Canary, MD ) and the vial was given by injection, patient tolerated well.

## 2022-10-20 LAB — CULTURE, BETA STREP (GROUP B ONLY): Strep Gp B Culture: POSITIVE — AB

## 2022-10-24 ENCOUNTER — Ambulatory Visit: Payer: Medicaid Other | Admitting: *Deleted

## 2022-10-24 ENCOUNTER — Ambulatory Visit: Payer: Medicaid Other | Attending: Obstetrics

## 2022-10-24 ENCOUNTER — Other Ambulatory Visit: Payer: Medicaid Other

## 2022-10-24 ENCOUNTER — Other Ambulatory Visit: Payer: Self-pay | Admitting: Obstetrics

## 2022-10-24 VITALS — BP 125/75 | HR 103

## 2022-10-24 DIAGNOSIS — Z8279 Family history of other congenital malformations, deformations and chromosomal abnormalities: Secondary | ICD-10-CM

## 2022-10-24 DIAGNOSIS — F1210 Cannabis abuse, uncomplicated: Secondary | ICD-10-CM

## 2022-10-24 DIAGNOSIS — O99213 Obesity complicating pregnancy, third trimester: Secondary | ICD-10-CM | POA: Insufficient documentation

## 2022-10-24 DIAGNOSIS — O365930 Maternal care for other known or suspected poor fetal growth, third trimester, not applicable or unspecified: Secondary | ICD-10-CM

## 2022-10-24 DIAGNOSIS — Z3A36 36 weeks gestation of pregnancy: Secondary | ICD-10-CM

## 2022-10-24 DIAGNOSIS — F419 Anxiety disorder, unspecified: Secondary | ICD-10-CM | POA: Diagnosis not present

## 2022-10-24 DIAGNOSIS — F259 Schizoaffective disorder, unspecified: Secondary | ICD-10-CM | POA: Diagnosis not present

## 2022-10-24 DIAGNOSIS — O99343 Other mental disorders complicating pregnancy, third trimester: Secondary | ICD-10-CM | POA: Diagnosis not present

## 2022-10-24 NOTE — Procedures (Signed)
Melissa [name] [birth year] [redacted]w[redacted]d Fetus A Non-Stress Test Interpretation for 10/24/22  Indication: IUGR  Fetal Heart Rate A Mode: External Baseline Rate (A): 140 bpm Variability: Moderate Accelerations: 15 x 15 Decelerations: None Multiple birth?: No  Uterine Activity Mode: Palpation, Toco Contraction Frequency (min): none Resting Tone Palpated: Relaxed  Interpretation (Fetal Testing) Nonstress Test Interpretation: Reactive Overall Impression: Reassuring for gestational age Comments: Dr. SEpimenio Sarinreviewed tracing

## 2022-10-25 ENCOUNTER — Ambulatory Visit (INDEPENDENT_AMBULATORY_CARE_PROVIDER_SITE_OTHER): Payer: Medicaid Other | Admitting: Licensed Practical Nurse

## 2022-10-25 VITALS — BP 120/83 | HR 85 | Wt 197.9 lb

## 2022-10-25 DIAGNOSIS — Z3480 Encounter for supervision of other normal pregnancy, unspecified trimester: Secondary | ICD-10-CM

## 2022-10-25 DIAGNOSIS — O0993 Supervision of high risk pregnancy, unspecified, third trimester: Secondary | ICD-10-CM

## 2022-10-25 DIAGNOSIS — Z3A37 37 weeks gestation of pregnancy: Secondary | ICD-10-CM
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
## 2022-10-25 NOTE — Addendum Note (Signed)
Addended by: Roberto Scales on: 10/25/2022 05:42 PM   Modules accepted: Orders

## 2022-10-30 ENCOUNTER — Ambulatory Visit: Admit: 2022-10-30 | Payer: PRIVATE HEALTH INSURANCE | Attending: Internal Medicine | Primary: Internal Medicine

## 2022-10-30 DIAGNOSIS — F411 Generalized anxiety disorder: Secondary | ICD-10-CM

## 2022-10-30 NOTE — Progress Notes (Signed)
Subjective:      Patient ID: Anna Frazier is a 27 y.o. female here for follow-up.  She has iron deficiency anemia.  She had GI workup with endoscopy colonoscopy, all came back negative.  No active bleeding noticed.  She also found to have B12 deficiency.  She has been taking B12 injection and also taking B12 oral.  Not able to tolerate iron supplement which is making her constipated.  She has anxiety, she was prescribed BuSpar which she is not taking because she is not willing to take medication.  She is able to handle her anxiety without medicine.  Has asthma, take albuterol inhaler as needed.  Recent lab work done, all discussed with her.    Asthma  Her past medical history is significant for asthma.       Review of Systems   Constitutional: Negative.    HENT: Negative.     Eyes: Negative.    Respiratory: Negative.     Cardiovascular: Negative.    Gastrointestinal: Negative.    Endocrine: Negative.    Genitourinary: Negative.    Musculoskeletal: Negative.    Neurological: Negative.    Hematological: Negative.    Psychiatric/Behavioral: Negative.         Objective:   Physical Exam  Constitutional:       Appearance: Normal appearance. She is normal weight.   Eyes:      Comments: Pallor present in conjunctiva.   Cardiovascular:      Rate and Rhythm: Normal rate and regular rhythm.      Pulses: Normal pulses.      Heart sounds: Normal heart sounds.   Pulmonary:      Effort: Pulmonary effort is normal.      Breath sounds: Normal breath sounds.   Abdominal:      General: Abdomen is flat. Bowel sounds are normal.      Palpations: Abdomen is soft.   Musculoskeletal:         General: Normal range of motion.      Cervical back: Normal range of motion and neck supple.   Skin:     General: Skin is warm.   Neurological:      Mental Status: She is alert.   Psychiatric:         Mood and Affect: Mood normal.         Behavior: Behavior normal.         Thought Content: Thought content normal.         Assessment / Plan:        Diagnoses and all orders for this visit:  Routine general medical examination at a health care facility  Seems healthy.  Advised to eat healthy and exercise.  All lab work done, all discussed with her.  Iron deficiency anemia, unspecified iron deficiency anemia type  Hemoglobin dropping again.  L colonoscopy and endoscopy were negative for any active bleeding.  She will if she do not have heavy menses.  She is not able to tolerate iron supplement because it makes her constipated.  She will see heme oncologist Dr. Jeannene Patella in a week.  Might need IV iron infusion.  GAD (generalized anxiety disorder)    Was on BuSpar but she is willing not to take any medication.  She is trying to manage her anxiety without any problem.  No depression.  B12 deficiency    Low vitamin B12..  She has received 1 B12 injection.  She will have it next week.  Also  taking B12 supplement.  Constipation, unspecified constipation type  High-fiber diet.  Iron is making her constipated.  She is not able to take it.  Need to drink more fluid.     Discussed expected course/resolution/complications of diagnosis in detail with patient.   Medication risks/benefits/costs/interactions/alternatives discussed with patient.   Pt was given an after visit summary which includes diagnoses, current medications & vitals.   Pt expressed understanding with the diagnosis and plan.

## 2022-10-31 ENCOUNTER — Other Ambulatory Visit: Payer: Self-pay

## 2022-10-31 ENCOUNTER — Encounter: Payer: Self-pay | Admitting: Obstetrics and Gynecology

## 2022-10-31 ENCOUNTER — Ambulatory Visit: Payer: Medicaid Other

## 2022-10-31 ENCOUNTER — Inpatient Hospital Stay
Admission: RE | Admit: 2022-10-31 | Discharge: 2022-11-03 | DRG: 807 | Disposition: A | Discharge status: Home / Self Care | Payer: Medicaid Other | Attending: Certified Nurse Midwife | Admitting: Certified Nurse Midwife

## 2022-10-31 DIAGNOSIS — Z9151 Personal history of suicidal behavior: Secondary | ICD-10-CM

## 2022-10-31 DIAGNOSIS — Z3A38 38 weeks gestation of pregnancy: Secondary | ICD-10-CM

## 2022-10-31 DIAGNOSIS — O365930 Maternal care for other known or suspected poor fetal growth, third trimester, not applicable or unspecified: Principal | ICD-10-CM | POA: Diagnosis present

## 2022-10-31 DIAGNOSIS — O0993 Supervision of high risk pregnancy, unspecified, third trimester: Secondary | ICD-10-CM

## 2022-10-31 DIAGNOSIS — O99344 Other mental disorders complicating childbirth: Secondary | ICD-10-CM | POA: Diagnosis present

## 2022-10-31 DIAGNOSIS — O365990 Maternal care for other known or suspected poor fetal growth, unspecified trimester, not applicable or unspecified: Secondary | ICD-10-CM | POA: Diagnosis present

## 2022-10-31 DIAGNOSIS — F250 Schizoaffective disorder, bipolar type: Secondary | ICD-10-CM | POA: Diagnosis present

## 2022-10-31 DIAGNOSIS — O99824 Streptococcus B carrier state complicating childbirth: Secondary | ICD-10-CM | POA: Diagnosis present

## 2022-10-31 DIAGNOSIS — Z3490 Encounter for supervision of normal pregnancy, unspecified, unspecified trimester: Secondary | ICD-10-CM | POA: Diagnosis present

## 2022-10-31 DIAGNOSIS — Z3480 Encounter for supervision of other normal pregnancy, unspecified trimester: Principal | ICD-10-CM

## 2022-10-31 DIAGNOSIS — F251 Schizoaffective disorder, depressive type: Secondary | ICD-10-CM | POA: Diagnosis present

## 2022-10-31 LAB — CBC
HCT: 33.6 % — ABNORMAL LOW (ref 36.0–46.0)
Hemoglobin: 11.4 g/dL — ABNORMAL LOW (ref 12.0–15.0)
MCH: 28.3 pg (ref 26.0–34.0)
MCHC: 33.9 g/dL (ref 30.0–36.0)
MCV: 83.4 fL (ref 80.0–100.0)
Platelets: 228 10*3/uL (ref 150–400)
RBC: 4.03 MIL/uL (ref 3.87–5.11)
RDW: 13.3 % (ref 11.5–15.5)
WBC: 8.1 10*3/uL (ref 4.0–10.5)
nRBC: 0 % (ref 0.0–0.2)

## 2022-10-31 LAB — URINE DRUG SCREEN, QUALITATIVE (ARMC ONLY)
Amphetamines, Ur Screen: NOT DETECTED
Barbiturates, Ur Screen: NOT DETECTED
Benzodiazepine, Ur Scrn: NOT DETECTED
Cannabinoid 50 Ng, Ur ~~LOC~~: NOT DETECTED
Cocaine Metabolite,Ur ~~LOC~~: NOT DETECTED
MDMA (Ecstasy)Ur Screen: NOT DETECTED
Methadone Scn, Ur: NOT DETECTED
Opiate, Ur Screen: NOT DETECTED
Phencyclidine (PCP) Ur S: NOT DETECTED
Tricyclic, Ur Screen: NOT DETECTED

## 2022-10-31 LAB — TYPE AND SCREEN
ABO/RH(D): B POS
Antibody Screen: NEGATIVE

## 2022-10-31 MED ORDER — FENTANYL CITRATE (PF) 100 MCG/2ML IJ SOLN
INTRAMUSCULAR | Status: AC
  Administered 2022-10-31: 50 ug via INTRAVENOUS
  Filled 2022-10-31: qty 2

## 2022-10-31 MED ORDER — LACTATED RINGERS IV SOLN: INTRAVENOUS | Status: DC

## 2022-10-31 MED ORDER — SOD CITRATE-CITRIC ACID 500-334 MG/5ML PO SOLN: 30.0000 mL | ORAL | Status: DC | PRN

## 2022-10-31 MED ORDER — SODIUM CHLORIDE 0.9 % IV SOLN
5.0000 10*6.[IU] | Freq: Once | INTRAVENOUS | Status: AC
  Administered 2022-11-01: 5 10*6.[IU] via INTRAVENOUS
  Filled 2022-10-31: qty 5

## 2022-10-31 MED ORDER — AMMONIA AROMATIC IN INHA
RESPIRATORY_TRACT | Status: AC
  Filled 2022-10-31: qty 10

## 2022-10-31 MED ORDER — OXYTOCIN-SODIUM CHLORIDE 30-0.9 UT/500ML-% IV SOLN
1.0000 m[IU]/min | INTRAVENOUS | Status: DC
  Administered 2022-10-31 – 2022-11-01 (×2): 2 m[IU]/min via INTRAVENOUS
  Filled 2022-10-31: qty 500

## 2022-10-31 MED ORDER — MISOPROSTOL 200 MCG PO TABS
ORAL_TABLET | ORAL | Status: AC
  Filled 2022-10-31: qty 4

## 2022-10-31 MED ORDER — FENTANYL CITRATE (PF) 100 MCG/2ML IJ SOLN: 100.0000 ug | INTRAMUSCULAR | Status: DC | PRN

## 2022-10-31 MED ORDER — OXYTOCIN-SODIUM CHLORIDE 30-0.9 UT/500ML-% IV SOLN
2.5000 [IU]/h | INTRAVENOUS | Status: DC
  Administered 2022-11-01: 2.5 [IU]/h via INTRAVENOUS

## 2022-10-31 MED ORDER — OXYTOCIN BOLUS FROM INFUSION
333.0000 mL | Freq: Once | INTRAVENOUS | Status: AC
  Administered 2022-11-01: 333 mL via INTRAVENOUS

## 2022-10-31 MED ORDER — LIDOCAINE HCL (PF) 1 % IJ SOLN
INTRAMUSCULAR | Status: AC
  Filled 2022-10-31: qty 30

## 2022-10-31 MED ORDER — ACETAMINOPHEN 325 MG PO TABS
650.0000 mg | ORAL_TABLET | Freq: Four times a day (QID) | ORAL | Status: DC | PRN
  Administered 2022-10-31 – 2022-11-01 (×3): 650 mg via ORAL
  Filled 2022-10-31 (×3): qty 2

## 2022-10-31 MED ORDER — PENICILLIN G POT IN DEXTROSE 60000 UNIT/ML IV SOLN
3.0000 10*6.[IU] | INTRAVENOUS | Status: DC
  Administered 2022-11-01 (×2): 3 10*6.[IU] via INTRAVENOUS
  Filled 2022-10-31 (×3): qty 50

## 2022-10-31 MED ORDER — TERBUTALINE SULFATE 1 MG/ML IJ SOLN: 0.2500 mg | Freq: Once | INTRAMUSCULAR | Status: DC | PRN

## 2022-10-31 MED ORDER — LACTATED RINGERS IV SOLN: 500.0000 mL | INTRAVENOUS | Status: DC | PRN

## 2022-10-31 MED ORDER — ONDANSETRON HCL 4 MG/2ML IJ SOLN
4.0000 mg | Freq: Four times a day (QID) | INTRAMUSCULAR | Status: DC | PRN
  Administered 2022-11-01: 4 mg via INTRAVENOUS
  Filled 2022-10-31: qty 2

## 2022-10-31 MED ORDER — OXYTOCIN 10 UNIT/ML IJ SOLN
INTRAMUSCULAR | Status: AC
  Filled 2022-10-31: qty 2

## 2022-10-31 MED ORDER — LIDOCAINE HCL (PF) 1 % IJ SOLN: 30.0000 mL | INTRAMUSCULAR | Status: DC | PRN
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
## 2022-10-31 NOTE — Progress Notes (Signed)
  Labor Progress Note   27 y.o. G3P1011 @ [redacted]w[redacted]d IOL for IUGR   Subjective:  Is not really feeling contractions, states they are like menstrual cramps but that they are not painful.  Objective:  BP 107/61   Pulse 73   Temp 98.1 F (36.7 C) (Oral)   Resp 18   Ht '5\' 8"'$  (1.727 m)   Wt 88 kg   LMP 02/08/2022 (Exact Date)   BMI 29.50 kg/m  Abd: gravid SVE: 3cm/90%/-2, vertex  EFM: 145, mod variability, pos accels, no decels Toco: Ucs q1-461m, Pit at 18   Assessment  G3P1011 @ 3711w6dL VSS FHR Cat I No cervical change   Plan:   1. Had discussion with TiaTamerraout options. Discussed turning pitocin off to potentially reset pitocin receptors and potentially give dose of cytotec. She does not want cytotec. So discussed placing cooks catheter and giving rest from pitocin. She was ok with that plan. Tolerated placement of cooks catheter 2. Plan SVE when cooks catheter falls out or sooner PRN  All discussed with patient  MarEastonNPNew Britain/GYN 10/31/2022  8:37 PM

## 2022-10-31 NOTE — Progress Notes (Signed)
  Labor Progress Note   27 y.o. G3P1011 @ [redacted]w[redacted]d IOL for IUGR.   Subjective:  Currently rates pain 5/10 with contractions although states that she hardly feels them as well.  Objective:  BP 107/61   Pulse 73   Temp 98.1 F (36.7 C) (Oral)   Resp 18   Ht '5\' 8"'$  (1.727 m)   Wt 88 kg   LMP 02/08/2022 (Exact Date)   BMI 29.50 kg/m  Abd: gravid SVE: 3cm/80%/-2  EFM: 140, mod variability, pos accels, no decels Toco: Ucs q3-725m, Pit at 1472min   Assessment  G3P1011 @ 37w63w6d VSS FHR Cat I   Plan:   1. Will continue IOL with pitocin titration  All discussed with patient  MarcKellerP RipleyGYN 10/31/2022  7:08 PM

## 2022-11-01 ENCOUNTER — Encounter: Payer: Medicaid Other | Admitting: Certified Nurse Midwife

## 2022-11-01 ENCOUNTER — Inpatient Hospital Stay: Payer: Medicaid Other | Admitting: Anesthesiology

## 2022-11-01 ENCOUNTER — Encounter: Payer: Self-pay | Admitting: Obstetrics and Gynecology

## 2022-11-01 DIAGNOSIS — F251 Schizoaffective disorder, depressive type: Secondary | ICD-10-CM | POA: Diagnosis present

## 2022-11-01 DIAGNOSIS — Z3A37 37 weeks gestation of pregnancy: Secondary | ICD-10-CM

## 2022-11-01 DIAGNOSIS — O99344 Other mental disorders complicating childbirth: Secondary | ICD-10-CM | POA: Diagnosis not present

## 2022-11-01 DIAGNOSIS — F209 Schizophrenia, unspecified: Secondary | ICD-10-CM

## 2022-11-01 DIAGNOSIS — O365930 Maternal care for other known or suspected poor fetal growth, third trimester, not applicable or unspecified: Secondary | ICD-10-CM

## 2022-11-01 DIAGNOSIS — Z3490 Encounter for supervision of normal pregnancy, unspecified, unspecified trimester: Secondary | ICD-10-CM | POA: Diagnosis present

## 2022-11-01 DIAGNOSIS — Z9151 Personal history of suicidal behavior: Secondary | ICD-10-CM | POA: Diagnosis not present

## 2022-11-01 DIAGNOSIS — O99820 Streptococcus B carrier state complicating pregnancy: Secondary | ICD-10-CM | POA: Diagnosis not present

## 2022-11-01 DIAGNOSIS — O99824 Streptococcus B carrier state complicating childbirth: Secondary | ICD-10-CM | POA: Diagnosis present

## 2022-11-01 DIAGNOSIS — Z3A38 38 weeks gestation of pregnancy: Secondary | ICD-10-CM | POA: Diagnosis not present

## 2022-11-01 LAB — RPR: RPR Ser Ql: NONREACTIVE

## 2022-11-01 MED ORDER — IBUPROFEN 600 MG PO TABS
600.0000 mg | ORAL_TABLET | Freq: Four times a day (QID) | ORAL | Status: DC
  Administered 2022-11-01 – 2022-11-03 (×7): 600 mg via ORAL
  Filled 2022-11-01 (×8): qty 1

## 2022-11-01 MED ORDER — OXYTOCIN-SODIUM CHLORIDE 30-0.9 UT/500ML-% IV SOLN
INTRAVENOUS | Status: AC
  Filled 2022-11-01: qty 500

## 2022-11-01 MED ORDER — DIPHENHYDRAMINE HCL 50 MG/ML IJ SOLN: 12.5000 mg | INTRAMUSCULAR | Status: DC | PRN

## 2022-11-01 MED ORDER — LIDOCAINE HCL (PF) 1 % IJ SOLN
INTRAMUSCULAR | Status: DC | PRN
  Administered 2022-11-01: 3 mL

## 2022-11-01 MED ORDER — EPHEDRINE 5 MG/ML INJ: 10.0000 mg | INTRAVENOUS | Status: DC | PRN

## 2022-11-01 MED ORDER — DIPHENHYDRAMINE HCL 25 MG PO CAPS: 25.0000 mg | ORAL_CAPSULE | Freq: Four times a day (QID) | ORAL | Status: DC | PRN

## 2022-11-01 MED ORDER — ONDANSETRON HCL 4 MG/2ML IJ SOLN: 4.0000 mg | INTRAMUSCULAR | Status: DC | PRN

## 2022-11-01 MED ORDER — WITCH HAZEL-GLYCERIN EX PADS
1.0000 | MEDICATED_PAD | CUTANEOUS | Status: DC | PRN
  Filled 2022-11-01: qty 100

## 2022-11-01 MED ORDER — LIDOCAINE-EPINEPHRINE (PF) 1.5 %-1:200000 IJ SOLN
INTRAMUSCULAR | Status: DC | PRN
  Administered 2022-11-01: 3 mL via PERINEURAL

## 2022-11-01 MED ORDER — BENZOCAINE-MENTHOL 20-0.5 % EX AERO
1.0000 | INHALATION_SPRAY | CUTANEOUS | Status: DC | PRN
  Filled 2022-11-01: qty 56

## 2022-11-01 MED ORDER — LACTATED RINGERS IV SOLN
500.0000 mL | Freq: Once | INTRAVENOUS | Status: AC
  Administered 2022-11-01: 500 mL via INTRAVENOUS

## 2022-11-01 MED ORDER — DIBUCAINE (PERIANAL) 1 % EX OINT: 1.0000 | TOPICAL_OINTMENT | CUTANEOUS | Status: DC | PRN

## 2022-11-01 MED ORDER — FENTANYL-BUPIVACAINE-NACL 0.5-0.125-0.9 MG/250ML-% EP SOLN
12.0000 mL/h | EPIDURAL | Status: DC | PRN
  Administered 2022-11-01: 12 mL/h via EPIDURAL
  Filled 2022-11-01: qty 250

## 2022-11-01 MED ORDER — SENNOSIDES-DOCUSATE SODIUM 8.6-50 MG PO TABS
2.0000 | ORAL_TABLET | ORAL | Status: DC
  Administered 2022-11-02: 2 via ORAL
  Filled 2022-11-01 (×2): qty 2

## 2022-11-01 MED ORDER — TETANUS-DIPHTH-ACELL PERTUSSIS 5-2.5-18.5 LF-MCG/0.5 IM SUSY
0.5000 mL | PREFILLED_SYRINGE | Freq: Once | INTRAMUSCULAR | Status: DC
  Filled 2022-11-01: qty 0.5

## 2022-11-01 MED ORDER — BUPIVACAINE HCL (PF) 0.25 % IJ SOLN
INTRAMUSCULAR | Status: DC | PRN
  Administered 2022-11-01: 4 mL via EPIDURAL

## 2022-11-01 MED ORDER — ZOLPIDEM TARTRATE 5 MG PO TABS: 5.0000 mg | ORAL_TABLET | Freq: Every evening | ORAL | Status: DC | PRN

## 2022-11-01 MED ORDER — ONDANSETRON HCL 4 MG PO TABS: 4.0000 mg | ORAL_TABLET | ORAL | Status: DC | PRN

## 2022-11-01 MED ORDER — PHENYLEPHRINE 80 MCG/ML (10ML) SYRINGE FOR IV PUSH (FOR BLOOD PRESSURE SUPPORT): 80.0000 ug | PREFILLED_SYRINGE | INTRAVENOUS | Status: DC | PRN

## 2022-11-01 MED ORDER — SIMETHICONE 80 MG PO CHEW: 80.0000 mg | CHEWABLE_TABLET | ORAL | Status: DC | PRN

## 2022-11-01 MED ORDER — PRENATAL MULTIVITAMIN CH
1.0000 | ORAL_TABLET | Freq: Every day | ORAL | Status: DC
  Administered 2022-11-02 – 2022-11-03 (×2): 1 via ORAL
  Filled 2022-11-01 (×2): qty 1

## 2022-11-01 MED ORDER — COCONUT OIL OIL: 1.0000 | TOPICAL_OIL | Status: DC | PRN

## 2022-11-01 MED ORDER — ACETAMINOPHEN 325 MG PO TABS
650.0000 mg | ORAL_TABLET | ORAL | Status: DC | PRN
  Administered 2022-11-01 – 2022-11-02 (×2): 650 mg via ORAL
  Filled 2022-11-01 (×2): qty 2

## 2022-11-01 NOTE — Anesthesia Preprocedure Evaluation (Signed)
Anesthesia Evaluation  Patient identified by MRN, date of birth, ID band Patient awake    Reviewed: Allergy & Precautions, H&P , NPO status , Patient's Chart, lab work & pertinent test results  History of Anesthesia Complications Negative for: history of anesthetic complications  Airway Mallampati: II  TM Distance: >3 FB Neck ROM: full    Dental  (+) Dental Advidsory Given   Pulmonary asthma    Pulmonary exam normal        Cardiovascular Exercise Tolerance: Good negative cardio ROS Normal cardiovascular exam     Neuro/Psych   Anxiety Depression Bipolar Disorder Schizophrenia  negative neurological ROS     GI/Hepatic negative GI ROS, Neg liver ROS,,,  Endo/Other  negative endocrine ROS    Renal/GU   negative genitourinary   Musculoskeletal   Abdominal   Peds  Hematology negative hematology ROS (+)   Anesthesia Other Findings   Reproductive/Obstetrics (+) Pregnancy                             Anesthesia Physical Anesthesia Plan  ASA: 2  Anesthesia Plan: Epidural   Post-op Pain Management:    Induction:   PONV Risk Score and Plan:   Airway Management Planned:   Additional Equipment:   Intra-op Plan:   Post-operative Plan:   Informed Consent: I have reviewed the patients History and Physical, chart, labs and discussed the procedure including the risks, benefits and alternatives for the proposed anesthesia with the patient or authorized representative who has indicated his/her understanding and acceptance.     Dental Advisory Given  Plan Discussed with: Anesthesiologist  Anesthesia Plan Comments:        Anesthesia Quick Evaluation

## 2022-11-01 NOTE — Progress Notes (Signed)
  Labor Progress Note   27 y.o. G3P1011 @ [redacted]w[redacted]d IOL for IUGR. Cooks catheter just came out on its own.   Subjective:  Was feeling more contractions when the cooks catheter was in place, right now they feel less strong.  Objective:  BP 116/78   Pulse 73   Temp 98.2 F (36.8 C) (Oral)   Resp 18   Ht '5\' 8"'$  (1.727 m)   Wt 88 kg   LMP 02/08/2022 (Exact Date)   BMI 29.50 kg/m  Abd: gravid SVE: 4cm/70%/-2, vertex  EFM: 140, mod variability, pos accels, occasional early decels Toco: Ucs q4-577m   Assessment  G3P1011 @ 3844w0dL VSS FHR Cat I   Plan:   1. Will restart pitocin and titrate until we achieve a strong and regular contraction pattern  All discussed with patient  MarEdgewaterNPCanton/GYN 11/01/2022  12:28 AM

## 2022-11-01 NOTE — Progress Notes (Signed)
CNM asked about PCN for GBS. States to wait until pt is having painful UCs and making cervical change

## 2022-11-01 NOTE — Anesthesia Procedure Notes (Signed)
Epidural Patient location during procedure: OB Start time: 11/01/2022 2:56 PM End time: 11/01/2022 3:15 PM  Staffing Anesthesiologist: Arita Miss, MD Resident/CRNA: Johnna Acosta, CRNA Performed: resident/CRNA   Preanesthetic Checklist Completed: patient identified, IV checked, site marked, risks and benefits discussed, surgical consent, monitors and equipment checked, pre-op evaluation and timeout performed  Epidural Patient position: sitting Prep: ChloraPrep Patient monitoring: heart rate, continuous pulse ox and blood pressure Approach: midline Location: L3-L4 Injection technique: LOR saline  Needle:  Needle type: Tuohy  Needle gauge: 17 G Needle length: 9 cm and 9 Needle insertion depth: 9 cm Catheter type: closed end flexible Catheter size: 19 Gauge Catheter at skin depth: 14 cm Test dose: negative and 1.5% lidocaine with Epi 1:200 K  Assessment Sensory level: T10 Events: blood not aspirated, no cerebrospinal fluid, injection not painful, no injection resistance, no paresthesia and negative IV test  Additional Notes 1 attempt Pt. Evaluated and documentation done after procedure finished. Patient identified. Risks/Benefits/Options discussed with patient including but not limited to bleeding, infection, nerve damage, paralysis, failed block, incomplete pain control, headache, blood pressure changes, nausea, vomiting, reactions to medication both or allergic, itching and postpartum back pain. Confirmed with bedside nurse the patient's most recent platelet count. Confirmed with patient that they are not currently taking any anticoagulation, have any bleeding history or any family history of bleeding disorders. Patient expressed understanding and wished to proceed. All questions were answered. Sterile technique was used throughout the entire procedure. Please see nursing notes for vital signs. Test dose was given through epidural catheter and negative prior to continuing to  dose epidural or start infusion. Warning signs of high block given to the patient including shortness of breath, tingling/numbness in hands, complete motor block, or any concerning symptoms with instructions to call for help. Patient was given instructions on fall risk and not to get out of bed. All questions and concerns addressed with instructions to call with any issues or inadequate analgesia.    Patient tolerated the insertion well without immediate complications.Reason for block:procedure for pain

## 2022-11-01 NOTE — Progress Notes (Signed)
  Labor Progress Note   27 y.o. G3P1011 @ [redacted]w[redacted]d IOL for IUGR   Subjective:  Feels like contractions are a little stronger than earlier, reports pain 8/10 with contractions.  Objective:  BP 116/78   Pulse 73   Temp 98.2 F (36.8 C) (Oral)   Resp 18   Ht '5\' 8"'$  (1.727 m)   Wt 88 kg   LMP 02/08/2022 (Exact Date)   BMI 29.50 kg/m  Abd: gravid SVE: 6cm/80%/-2  EFM: 135, mod variability, pos accels, no decels Toco: Ucs q3-47m, Pit at 1039min  First dose of PCN given for GBS prophylaxis   Assessment  G3P1011 @ 38w84w0d VSS FHR Cat I   Plan:   1. Continue to titrate pitocin up as needed 2. Plan SVE within 3-4 hr or sooner PRN   All discussed with patient  MarcSpackenkillP Chena RidgeGYN 11/01/2022  5:29 AM

## 2022-11-01 NOTE — Plan of Care (Signed)
  Problem: Education: Goal: Knowledge of General Education information will improve Description: Including pain rating scale, medication(s)/side effects and non-pharmacologic comfort measures Outcome: Progressing   Problem: Health Behavior/Discharge Planning: Goal: Ability to manage health-related needs will improve Outcome: Progressing   Problem: Clinical Measurements: Goal: Ability to maintain clinical measurements within normal limits will improve Outcome: Progressing Goal: Will remain free from infection Outcome: Progressing Goal: Diagnostic test results will improve Outcome: Progressing Goal: Respiratory complications will improve Outcome: Progressing Goal: Cardiovascular complication will be avoided Outcome: Progressing   Problem: Activity: Goal: Risk for activity intolerance will decrease Outcome: Progressing   Problem: Nutrition: Goal: Adequate nutrition will be maintained Outcome: Progressing   Problem: Coping: Goal: Level of anxiety will decrease Outcome: Progressing   Problem: Elimination: Goal: Will not experience complications related to bowel motility Outcome: Progressing Goal: Will not experience complications related to urinary retention Outcome: Progressing   Problem: Pain Managment: Goal: General experience of comfort will improve Outcome: Progressing   Problem: Safety: Goal: Ability to remain free from injury will improve Outcome: Progressing   Problem: Skin Integrity: Goal: Risk for impaired skin integrity will decrease Outcome: Progressing   Problem: Education: Goal: Knowledge of condition will improve Outcome: Progressing Goal: Individualized Educational Video(s) Outcome: Progressing Goal: Individualized Newborn Educational Video(s) Outcome: Progressing   Problem: Activity: Goal: Will verbalize the importance of balancing activity with adequate rest periods Outcome: Progressing Goal: Ability to tolerate increased activity will  improve Outcome: Progressing   Problem: Coping: Goal: Ability to identify and utilize available resources and services will improve Outcome: Progressing   Problem: Life Cycle: Goal: Chance of risk for complications during the postpartum period will decrease Outcome: Progressing   Problem: Role Relationship: Goal: Ability to demonstrate positive interaction with newborn will improve Outcome: Progressing   Problem: Skin Integrity: Goal: Demonstration of wound healing without infection will improve Outcome: Progressing   

## 2022-11-01 NOTE — Progress Notes (Signed)
Melissa [name] is a 27 y.o. G3P1011 at [redacted]w[redacted]d who continues with induction of labor due to diagnosis of IUGR.  Subjective:  Melissa [name] is not reporting that her contractions are very painful   Objective: BP 121/77   Pulse 80   Temp 98.1 F (36.7 C) (Oral)   Resp 18   Ht '5\' 8"'$  (1.727 m)   Wt 88 kg   LMP 02/08/2022 (Exact Date)   SpO2 100%   BMI 29.50 kg/m  No intake/output data recorded. No intake/output data recorded.  FHT:  FHR: 135 bpm, variability: moderate,  accelerations:  Present,  decelerations:  Present some non repetitive variables UC:   regular, every 2-429mutes SVE: 5 cms/70%/-2, OP Labs: Lab Results  Component Value Date   WBC 8.1 10/31/2022   HGB 11.4 (L) 10/31/2022   HCT 33.6 (L) 10/31/2022   MCV 83.4 10/31/2022   PLT 228 10/31/2022    Assessment / Plan: Induction of labor due to IUGR,  progressing well on pitocin  Labor:  AROM performed- clear fluid  Fetal Wellbeing:  Category II Pain Control:  IV pain meds and and now desires epidural I/D:  n/a Anticipated MOD:  NSVD  MaImagene RichesCNM 11/01/2022, 4:22 PM

## 2022-11-01 NOTE — Progress Notes (Signed)
Melissa [name] is a 27 y.o. G3P1011 at 20w0dby ultrasound who continues with induction of labor. For IUGR.  Subjective: she reports feeling comfortable.  Asking to have pitocin so that she can Move on towards delivery.She denies painful contractions.   Objective: BP 108/72 (BP Location: Left Arm)   Pulse 80   Temp 97.7 F (36.5 C) (Oral)   Resp 18   Ht '5\' 8"'$  (1.727 m)   Wt 88 kg   LMP 02/08/2022 (Exact Date)   BMI 29.50 kg/m  No intake/output data recorded. No intake/output data recorded.  FHT:  FHR: 135 bpm, variability: moderate,  accelerations:  Present,  decelerations:  Absent UC:   irregular, mild SVE:   Dilation: 4 Effacement (%): 60 Station: -2 Exam by:: MLynett FishCNM  Labs: Lab Results  Component Value Date   WBC 8.1 10/31/2022   HGB 11.4 (L) 10/31/2022   HCT 33.6 (L) 10/31/2022   MCV 83.4 10/31/2022   PLT 228 10/31/2022    Assessment / Plan: Induction of labor due to IUGR,  With inadequate contrraction pattern Labor: Progressing on Pitocin, will continue to increase then AROM  Fetal Wellbeing:  Category I Pain Control:  IV pain meds I/D:  n/a Anticipated MOD:  NSVD Will advance pitocin. Recheck in several hours.  MImagene Riches CNM 11/01/2022, 9:18 AM

## 2022-11-02 ENCOUNTER — Ambulatory Visit: Admit: 2022-11-02 | Payer: PRIVATE HEALTH INSURANCE | Attending: Internal Medicine | Primary: Internal Medicine

## 2022-11-02 ENCOUNTER — Ambulatory Visit
Admit: 2022-11-02 | Discharge: 2022-11-02 | Payer: PRIVATE HEALTH INSURANCE | Attending: Internal Medicine | Primary: Internal Medicine

## 2022-11-02 ENCOUNTER — Encounter

## 2022-11-02 DIAGNOSIS — F251 Schizoaffective disorder, depressive type: Secondary | ICD-10-CM | POA: Diagnosis not present

## 2022-11-02 DIAGNOSIS — D509 Iron deficiency anemia, unspecified: Secondary | ICD-10-CM

## 2022-11-02 DIAGNOSIS — E538 Deficiency of other specified B group vitamins: Secondary | ICD-10-CM

## 2022-11-02 LAB — CBC
HCT: 31.9 % — ABNORMAL LOW (ref 36.0–46.0)
Hemoglobin: 10.6 g/dL — ABNORMAL LOW (ref 12.0–15.0)
MCH: 28.5 pg (ref 26.0–34.0)
MCHC: 33.2 g/dL (ref 30.0–36.0)
MCV: 85.8 fL (ref 80.0–100.0)
Platelets: 194 10*3/uL (ref 150–400)
RBC: 3.72 MIL/uL — ABNORMAL LOW (ref 3.87–5.11)
RDW: 13.4 % (ref 11.5–15.5)
WBC: 9.4 10*3/uL (ref 4.0–10.5)
nRBC: 0 % (ref 0.0–0.2)

## 2022-11-02 MED ORDER — CYANOCOBALAMIN 1000 MCG/ML IJ SOLN
1000 | Freq: Once | INTRAMUSCULAR | Status: AC
Start: 2022-11-02 — End: 2022-11-02
  Administered 2022-11-02: 20:00:00 1000 ug via INTRAMUSCULAR
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
## 2022-11-02 NOTE — Progress Notes (Signed)
Pt called out that her IV was "coming out". Nurse went and assessed and IV catheter was in fact completely out, but still taped. Pt had just got out of the shower.   Nurse removed IV at 0800.

## 2022-11-02 NOTE — Lactation Note (Signed)
This note was copied from a baby's chart. Lactation Consultation Note  Patient Name: Melissa [name] [id number] Date: 11/02/2022 Age:29 hours     Per mom she wants to bottle feed her baby formula and any pumped milk she can express. Mom reports she does not want to directly breastfeed. LC set-up breastpump for mom this evening. Mom was bottle feeding baby and said she will pump in a little bit. Reviewed instructions for pump use, how to clean parts, and milk storage guidelines.Mom has an electric breastpump at home.    Melissa [name] 11/02/2022, 7:44 PM

## 2022-11-02 NOTE — Consult Note (Signed)
Secretary Psychiatry Consult   Reason for Consult: Consult for 27 year old woman with a history of chronic mental illness just delivered of a baby. Referring Physician: Grandville Silos Patient Identification: Melissa [name] MRN:  [id number] Principal Diagnosis: Schizoaffective disorder, depressive type ([place]) Diagnosis:  Principal Problem:   Schizoaffective disorder, depressive type ([place]) Active Problems:   Encounter for planned induction of labor   Postpartum care following vaginal delivery   Total Time spent with patient: 45 minutes  Subjective:   Melissa [name] is a 28 y.o. female patient admitted with "I feel really good".  HPI: Patient seen and chart reviewed.  27 year old woman who was admitted to the hospital for planned induced delivery of her baby.  Baby was delivered yesterday.  Consult is to follow-up with mental health history.  Patient states currently that her mood is feeling "excited".  She does not however appear to be manic.  Her affect shows appropriate reactivity.  Her thoughts are lucid without any sign of delusional or disorganized thinking.  She says that her mood has been good recently and that she has been sleeping well at night.  She denies having had any sort of hallucinations or paranoia and denies any suicidal or homicidal thoughts.  She has been off of prescribed mental health medication since the first trimester of the pregnancy by her own choice.  She tells me that the psychiatrist she was seeing at the time urged her to continue medication and that when the patient refused the psychiatrist told her to go to the emergency room but that the patient did not do that.  She says that she does not believe she has had any psychiatric symptoms during the pregnancy.  Patient's plan at this time is to go stay with her father and some other extended family in [place] in [place].  She says she feels safe and comfortable with this and that she has "a good support  system" and insists that she will be "watched all the time".  Patient denies any drug or alcohol use.  Full interview and conversation were done with an older female relative in the room watching the baby with the patient's consent.  Past Psychiatric History: Patient has a long well-established history of mental illness going back many years at least until her early 60s.  She has had hospitalization here and at [facility] and I see one note that reports she has had a total of 8 hospitalizations.  Patient says she has not been in the hospital for about 3 years.  Presenting symptoms have included suicide attempts depression and mood instability paranoia psychosis.  She has also presented at times with agitation aggression hostility accompanied by dysphoric mood.  She acknowledges there have been more than 1 suicide attempts in the past.  Patient has been diagnosed with schizoaffective disorder and her chart although the diagnosis of bipolar disorder is also mentioned.  Without much more history and experience of the patient is hard to differentiate but in any case is clear she has a chronic recurrent mental health disorder that has featured psychosis and highly dangerous behavior in the past.  She has been treated with several medications.  It appears that Abilify long-acting injections have probably been used most consistently but she also recalls having been on Zoloft in the past and thinks that she has been on some other mood stabilizers as well.  There have been notes in the chart from her previous pregnancy in which it is documented that she  had acknowledged having homicidal thoughts towards her other living child although I do not see any documentation of her acting out violently.  Risk to Self:   Risk to Others:   Prior Inpatient Therapy:   Prior Outpatient Therapy:    Past Medical History:  Past Medical History:  Diagnosis Date   Anxiety    Asthma    Bipolar 1 disorder ([place])    Chlamydia     Collapsed lung 06/11/2020   Depression    Dizziness    Gonorrhea    PTSD (post-traumatic stress disorder)    Rib fracture    Syncope     Past Surgical History:  Procedure Laterality Date   chest tube  06/11/2020   Family History:  Family History  Problem Relation Age of Onset   Asthma Mother    Hypertension Father    Cancer Neg Hx    Diabetes Neg Hx    Heart disease Neg Hx    Stroke Neg Hx    Family Psychiatric  History: No mental health history provided or available Social History:  Social History   Substance and Sexual Activity  Alcohol Use Not Currently     Social History   Substance and Sexual Activity  Drug Use Not Currently   Frequency: 5.0 times per week   Types: Marijuana   Comment: 7 years    Social History   Socioeconomic History   Marital status: Single    Spouse name: Not on file   Number of children: 1   Years of education: 12   Highest education level: Some college, no degree  Occupational History   Occupation: unemployed  Tobacco Use   Smoking status: Never   Smokeless tobacco: Never  Vaping Use   Vaping Use: Never used  Substance and Sexual Activity   Alcohol use: Not Currently   Drug use: Not Currently    Frequency: 5.0 times per week    Types: Marijuana    Comment: 7 years   Sexual activity: Yes    Partners: Male    Birth control/protection: None  Other Topics Concern   Not on file  Social History Narrative   Not on file   Social Determinants of Health   Financial Resource Strain: Low Risk  (05/31/2022)   Overall Financial Resource Strain (CARDIA)    Difficulty of Paying Living Expenses: Not hard at all  Food Insecurity: No Food Insecurity (10/31/2022)   Hunger Vital Sign    Worried About Running Out of Food in the Last Year: Never true    Ran Out of Food in the Last Year: Never true  Transportation Needs: No Transportation Needs (10/31/2022)   PRAPARE - Hydrologist (Medical): No    Lack of  Transportation (Non-Medical): No  Physical Activity: Inactive (05/31/2022)   Exercise Vital Sign    Days of Exercise per Week: 0 days    Minutes of Exercise per Session: 0 min  Stress: No Stress Concern Present (05/31/2022)   [place]    Feeling of Stress : Not at all  Social Connections: Unknown (05/31/2022)   Social Connection and Isolation Panel [NHANES]    Frequency of Communication with Friends and Family: More than three times a week    Frequency of Social Gatherings with Friends and Family: More than three times a week    Attends Religious Services: Never    Marine scientist or Organizations: No  Attends Archivist Meetings: Never    Marital Status: Not on file   Additional Social History:    Allergies:  No Known Allergies  Labs:  Results for orders placed or performed during the hospital encounter of 10/31/22 (from the past 48 hour(s))  CBC     Status: Abnormal   Collection Time: 11/02/22  5:38 AM  Result Value Ref Range   WBC 9.4 4.0 - 10.5 K/uL   RBC 3.72 (L) 3.87 - 5.11 MIL/uL   Hemoglobin 10.6 (L) 12.0 - 15.0 g/dL   HCT 31.9 (L) 36.0 - 46.0 %   MCV 85.8 80.0 - 100.0 fL   MCH 28.5 26.0 - 34.0 pg   MCHC 33.2 30.0 - 36.0 g/dL   RDW 13.4 11.5 - 15.5 %   Platelets 194 150 - 400 K/uL   nRBC 0.0 0.0 - 0.2 %    Comment: Performed at [facility], [address], [place],  [address]    Current Facility-Administered Medications  Medication Dose Route Frequency Provider Last Rate Last Admin   acetaminophen (TYLENOL) tablet 650 mg  650 mg Oral Q4H PRN Imagene Riches, CNM   650 mg at 11/02/22 0542   benzocaine-Menthol (DERMOPLAST) 20-0.5 % topical spray 1 Application  1 Application Topical PRN Imagene Riches, CNM       coconut oil  1 Application Topical PRN Imagene Riches, CNM       witch hazel-glycerin (TUCKS) pad 1 Application  1 Application Topical PRN Imagene Riches, CNM       And   dibucaine (NUPERCAINAL) 1 % rectal ointment 1 Application  1 Application Rectal PRN Imagene Riches, CNM       diphenhydrAMINE (BENADRYL) capsule 25 mg  25 mg Oral Q6H PRN Imagene Riches, CNM       ibuprofen (ADVIL) tablet 600 mg  600 mg Oral Q6H Gigi Gin M, CNM   600 mg at 11/02/22 1315   ondansetron (ZOFRAN) tablet 4 mg  4 mg Oral Q4H PRN Imagene Riches, CNM       Or   ondansetron [facility]) injection 4 mg  4 mg Intravenous Q4H PRN Imagene Riches, CNM       prenatal multivitamin tablet 1 tablet  1 tablet Oral Q1200 Imagene Riches, CNM   1 tablet at 11/02/22 1316   senna-docusate (Senokot-S) tablet 2 tablet  2 tablet Oral Q24H Gigi Gin M, CNM       simethicone [facility]) chewable tablet 80 mg  80 mg Oral PRN Imagene Riches, CNM       Tdap Durwin Reges) injection 0.5 mL  0.5 mL Intramuscular Once Imagene Riches, CNM       zolpidem (AMBIEN) tablet 5 mg  5 mg Oral QHS PRN Imagene Riches, CNM        Musculoskeletal: Strength & Muscle Tone: within normal limits Gait & Station: normal Patient leans: N/A            Psychiatric Specialty Exam:  Presentation  General Appearance: No data recorded Eye Contact:No data recorded Speech:No data recorded Speech Volume:No data recorded Handedness:No data recorded  Mood and Affect  Mood:No data recorded Affect:No data recorded  Thought Process  Thought Processes:No data recorded Descriptions of Associations:No data recorded Orientation:No data recorded Thought Content:No data recorded History of Schizophrenia/Schizoaffective disorder:No data recorded Duration of Psychotic Symptoms:No data recorded Hallucinations:No data recorded Ideas of Reference:No data recorded Suicidal Thoughts:No data recorded Homicidal Thoughts:No  data recorded  Sensorium  Memory:No data recorded Judgment:No data recorded Insight:No data recorded  Executive Functions  Concentration:No data  recorded Attention Span:No data recorded Recall:No data recorded [place] recorded Language:No data recorded  Psychomotor Activity  Psychomotor Activity:No data recorded  Assets  Assets:No data recorded  Sleep  Sleep:No data recorded  Physical Exam: Physical Exam Vitals and nursing note reviewed.  Constitutional:      Appearance: Normal appearance.  HENT:     Head: Normocephalic and atraumatic.     Mouth/Throat:     Pharynx: Oropharynx is clear.  Eyes:     Pupils: Pupils are equal, round, and reactive to light.  Cardiovascular:     Rate and Rhythm: Normal rate and regular rhythm.  Pulmonary:     Effort: Pulmonary effort is normal.     Breath sounds: Normal breath sounds.  Abdominal:     General: Abdomen is flat.     Palpations: Abdomen is soft.  Musculoskeletal:        General: Normal range of motion.  Skin:    General: Skin is warm and dry.  Neurological:     General: No focal deficit present.     Mental Status: She is alert. Mental status is at baseline.  Psychiatric:        Attention and Perception: Attention normal.        Mood and Affect: Mood normal.        Speech: Speech normal.        Behavior: Behavior normal.        Thought Content: Thought content normal.        Cognition and Memory: Cognition normal.    Review of Systems  Constitutional: Negative.   HENT: Negative.    Eyes: Negative.   Respiratory: Negative.    Cardiovascular: Negative.   Gastrointestinal: Negative.   Musculoskeletal: Negative.   Skin: Negative.   Neurological: Negative.   Psychiatric/Behavioral: Negative.     Blood pressure 102/68, pulse 74, temperature 98.7 F (37.1 C), temperature source Oral, resp. rate 18, height '5\' 8"'$  (1.727 m), weight 88 kg, last menstrual period 02/08/2022, SpO2 99 %, unknown if currently breastfeeding. Body mass index is 29.5 kg/m.  Treatment Plan Summary: Plan 27 year old woman just delivered of a apparently healthy but low  weight infant.  Patient with a history of established chronic recurrent mood and psychotic symptoms accompanied by dangerousness.  I explained to the patient several times that given her past history she is at high risk to develop a mood episode with psychotic features and that this could happen at any time and that the postpartum period is a particularly stressful and high risk time for this.  I explained to her that despite the wonderful benefits of having a good support system around her that this illness is managed best with medication and that getting back on psychiatric medicine would be by far the safest choice to decrease risk of dangerous or even fatal outcomes.  I specifically suggested that we restart long-acting Abilify shot which could be given today and then followed up with oral Abilify and referral to outpatient mental health services.  Patient states that the Abilify made her chest hurt in the past and that she did not like it and will not take it.  I offered to use a different long-acting injectable pointing out that they may have different side effects and she declined these as well.  I offered to start oral medication.  Reviewed several mood  stabilizing medicines including the Abilify, Seroquel, Zyprexa and lithium or even antidepressant medicines although that is a little more risky.  Patient declined my suggestion to start oral psychiatric medicines.  She insisted that she feels like she can manage her own mood and that if she starts to have symptoms she can come in for treatment.  I advised her that I thought that that was a mistake and that mood symptoms could come on quickly and insidiously and that medication treatment would be strongly recommended to decrease the risk of a bad outcome.  Patient did listen to mean calmly.  She did not appear to be psychotic in her thinking.  Her replies were rational.  There is no evidence that she is psychotic nor that she is acutely dangerous at this  point and therefore she does not meet commitment criteria.  She listened to my proposed treatment plan and information and showed every sign of having understood it and asked appropriate questions.  I believe that at this point she has the capacity to make decision about her own medication. Finally I advised the patient that even if she refused medication she absolutely should be referred for outpatient mental health follow-up.  I am going to put in a consult to the transition of care people on the mother baby unit and also have passed this along to the nurse on duty and will advise the attending as well.  Patient was educated about signs of mood instability and advised again that she absolutely should listen to her family and come in for treatment immediately if there is any concern.  I did make it clear to the patient that I think that this is not the best decision and that it is putting herself and her child at risk.  We discussed her concerns about breast-feeding and I acknowledged that that was a reasonable concern but that the danger of depression and psychosis and mood instability and the mother would generally be thought to outweigh that. Alethia Berthold, MD 11/02/2022 2:23 PM

## 2022-11-02 NOTE — Anesthesia Postprocedure Evaluation (Signed)
Anesthesia Post Note  Patient: Melissa [name]  Procedure(s) Performed: AN AD Scalp Level  Patient location during evaluation: Mother Baby Anesthesia Type: Epidural Level of consciousness: awake and alert Pain management: pain level controlled Vital Signs Assessment: post-procedure vital signs reviewed and stable Respiratory status: spontaneous breathing, nonlabored ventilation and respiratory function stable Cardiovascular status: stable Postop Assessment: no headache, no backache and epidural receding Anesthetic complications: no  No notable events documented.   Last Vitals:  Vitals:   11/02/22 0343 11/02/22 0833  BP: 115/84 107/74  Pulse: 60 75  Resp: 18 20  Temp: 36.7 C 37 C  SpO2: 100% 99%    Last Pain:  Vitals:   11/02/22 0833  TempSrc: Oral  PainSc:                  Caryl Asp

## 2022-11-02 NOTE — Progress Notes (Signed)
Progress Note - Vaginal Delivery  Melissa [name] is a 27 y.o. [id number] now PP day 1 s/p Vaginal, Spontaneous .   Subjective:  The patient reports no complaints, up ad lib, voiding, and tolerating PO Pt states she is feeling well mentally , she denies any thoughts of self harm or harm to the baby. She has a support person with her at the bedside.  Objective:  Vital signs in last 24 hours: Temp:  [98 F (36.7 C)-98.6 F (37 C)] 98.6 F (37 C) (03/08 0833) Pulse Rate:  [58-122] 75 (03/08 0833) Resp:  [16-20] 20 (03/08 0833) BP: (70-138)/(55-84) 107/74 (03/08 0833) SpO2:  [95 %-100 %] 99 % (03/08 [id number])  Physical Exam:  General: alert, cooperative, appears stated age, and no distress Lochia: appropriate Uterine Fundus: firm    Data Review Recent Labs    10/31/22 1407 11/02/22 0538  HGB 11.4* 10.6*  HCT 33.6* 31.9*    Assessment/Plan: Principal Problem:   Encounter for planned induction of labor Active Problems:   Postpartum care following vaginal delivery   Pt requesting to be discharge this evening, pediatrician recommend 48 hrs observation of baby due to fetal size. Awaiting psychiatric consult. PT state she has a Social worker that she sees a Visual merchandiser. She has an appointment next week. We discussed medication management she states she was on Abilify but stopped during her pregnancy. She declines restarting the medications , state she does not like taking medications and she is feeling well. Discussed birth control postpartum. She declines this as well , state she is not sexually active.   Plan for discharge tomorrow   -- Continue routine PP care.     Melissa [name], CNM  11/02/2022 10:15 AM

## 2022-11-02 NOTE — Consult Note (Signed)
[facility] Consult   Reason for Consult:  schizoaffective disorder, bipolar disorder Referring Physician:  Gigi Gin, CNM Patient Identification: Melissa [name] MRN:  [id number] Principal Diagnosis: Schizoaffective disorder, depressive type ([place]) Diagnosis:  Principal Problem:   Schizoaffective disorder, depressive type ([place]) Active Problems:   Encounter for planned induction of labor   Postpartum care following vaginal delivery   Total Time spent with patient: 45 minutes  Subjective:   Melissa [name] is a 27 y.o. female patient admitted for the birth of her baby, consult placed for past psychiatric history.  HPI:  27 yo female admitted for the birth of her daughter, history of mental health issues.  On assessment, she is cuddling her baby appropriately with her mother at her bedside.  According to notes, she is also breastfeeding the baby.  She is excited on assessment while talking about her baby.  She denies depression, anxiety and mania symptoms.  In general, she did say her sleep is good until last night because of the baby.  Her mother is here from [place] and the patient lives with her father, stepmother, and two siblings.  She stated she was on Abilify Maintena until she found out she was pregnant and stopped taking it.  Her psychiatrist evidently recommended her to come to the ED as she reported her chest was hurting, she did not as it subsided.  She does not desire to be on medications now as she is breastfeeding without any current symptoms.  This provider reviewed her ObGyn history and there are no notes regarding any inappropriate behaviors or mood issues.  Her RN on the floor stated she has been appropriate on the floor and caring for her baby.  It was noted that she had "odd behavior" in L&D, difficult to discern considering she was in labor.  Her mother had no concerns who was beside her.  Based on past history, Dr. Weber Cooks also assessed the client and found  her appropriate also.  He worked a Chief Strategy Officer with the family who will be supervising the client and per the notes, DSS is involved to monitor the situation as her first baby is not in her custody (do not know the rationale).  She does report going to [facility] for her psychiatric care.  Past Psychiatric History: anxiety, bipolar, PTSD, schizoaffective, depression  Risk to Self:  none Risk to Others:  none Prior Inpatient Therapy:  yes Prior Outpatient Therapy:  [facility]  Past Medical History:  Past Medical History:  Diagnosis Date   Anxiety    Asthma    Bipolar 1 disorder ([place])    Chlamydia    Collapsed lung 06/11/2020   Depression    Dizziness    Gonorrhea    PTSD (post-traumatic stress disorder)    Rib fracture    Syncope     Past Surgical History:  Procedure Laterality Date   chest tube  06/11/2020   Family History:  Family History  Problem Relation Age of Onset   Asthma Mother    Hypertension Father    Cancer Neg Hx    Diabetes Neg Hx    Heart disease Neg Hx    Stroke Neg Hx    Family Psychiatric  History: none Social History:  Social History   Substance and Sexual Activity  Alcohol Use Not Currently     Social History   Substance and Sexual Activity  Drug Use Not Currently   Frequency: 5.0 times per  week   Types: Marijuana   Comment: 7 years    Social History   Socioeconomic History   Marital status: Single    Spouse name: Not on file   Number of children: 1   Years of education: 12   Highest education level: Some college, no degree  Occupational History   Occupation: unemployed  Tobacco Use   Smoking status: Never   Smokeless tobacco: Never  Vaping Use   Vaping Use: Never used  Substance and Sexual Activity   Alcohol use: Not Currently   Drug use: Not Currently    Frequency: 5.0 times per week    Types: Marijuana    Comment: 7 years   Sexual activity: Yes    Partners: Male    Birth  control/protection: None  Other Topics Concern   Not on file  Social History Narrative   Not on file   Social Determinants of Health   Financial Resource Strain: Low Risk  (05/31/2022)   Overall Financial Resource Strain (CARDIA)    Difficulty of Paying Living Expenses: Not hard at all  Food Insecurity: No Food Insecurity (10/31/2022)   Hunger Vital Sign    Worried About Running Out of Food in the Last Year: Never true    [place] in the Last Year: Never true  Transportation Needs: No Transportation Needs (10/31/2022)   PRAPARE - Hydrologist (Medical): No    Lack of Transportation (Non-Medical): No  Physical Activity: Inactive (05/31/2022)   Exercise Vital Sign    Days of Exercise per Week: 0 days    Minutes of Exercise per Session: 0 min  Stress: No Stress Concern Present (05/31/2022)   [place]    Feeling of Stress : Not at all  Social Connections: Unknown (05/31/2022)   Social Connection and Isolation Panel [NHANES]    Frequency of Communication with Friends and Family: More than three times a week    Frequency of Social Gatherings with Friends and Family: More than three times a week    Attends Religious Services: Never    Marine scientist or Organizations: No    Attends Archivist Meetings: Never    Marital Status: Not on file   Additional Social History:    Allergies:  No Known Allergies  Labs:  Results for orders placed or performed during the hospital encounter of 10/31/22 (from the past 48 hour(s))  CBC     Status: Abnormal   Collection Time: 11/02/22  5:38 AM  Result Value Ref Range   WBC 9.4 4.0 - 10.5 K/uL   RBC 3.72 (L) 3.87 - 5.11 MIL/uL   Hemoglobin 10.6 (L) 12.0 - 15.0 g/dL   HCT 31.9 (L) 36.0 - 46.0 %   MCV 85.8 80.0 - 100.0 fL   MCH 28.5 26.0 - 34.0 pg   MCHC 33.2 30.0 - 36.0 g/dL   RDW 13.4 11.5 - 15.5 %   Platelets 194 150 - 400 K/uL    nRBC 0.0 0.0 - 0.2 %    Comment: Performed at [facility], [address] [place], [place] [address]    Current Facility-Administered Medications  Medication Dose Route Frequency Provider Last Rate Last Admin   acetaminophen (TYLENOL) tablet 650 mg  650 mg Oral Q4H PRN Imagene Riches, CNM   650 mg at 11/02/22 0542   benzocaine-Menthol (DERMOPLAST) 20-0.5 % topical spray 1 Application  1 Application  Topical PRN Imagene Riches, CNM       coconut oil  1 Application Topical PRN Imagene Riches, CNM       witch hazel-glycerin (TUCKS) pad 1 Application  1 Application Topical PRN Imagene Riches, CNM       And   dibucaine (NUPERCAINAL) 1 % rectal ointment 1 Application  1 Application Rectal PRN Imagene Riches, CNM       diphenhydrAMINE (BENADRYL) capsule 25 mg  25 mg Oral Q6H PRN Imagene Riches, CNM       ibuprofen (ADVIL) tablet 600 mg  600 mg Oral Q6H Imagene Riches, CNM   600 mg at 11/02/22 1315   ondansetron (ZOFRAN) tablet 4 mg  4 mg Oral Q4H PRN Imagene Riches, CNM       Or   ondansetron [facility]) injection 4 mg  4 mg Intravenous Q4H PRN Imagene Riches, CNM       prenatal multivitamin tablet 1 tablet  1 tablet Oral Q1200 Imagene Riches, CNM   1 tablet at 11/02/22 1316   senna-docusate (Senokot-S) tablet 2 tablet  2 tablet Oral Q24H Gigi Gin M, CNM       simethicone [facility]) chewable tablet 80 mg  80 mg Oral PRN Imagene Riches, CNM       Tdap Durwin Reges) injection 0.5 mL  0.5 mL Intramuscular Once Imagene Riches, CNM       zolpidem [facility]) tablet 5 mg  5 mg Oral QHS PRN Imagene Riches, CNM        Musculoskeletal: Strength & Muscle Tone: within normal limits Gait & Station: did not witness Patient leans: N/A  Psychiatric Specialty Exam: Physical Exam Vitals and nursing note reviewed.  Constitutional:      Appearance: Normal appearance.  HENT:     Head: Normocephalic.     Nose: Nose normal.  Pulmonary:     Effort: Pulmonary  effort is normal.  Musculoskeletal:        General: Normal range of motion.     Cervical back: Normal range of motion.  Neurological:     General: No focal deficit present.     Mental Status: She is alert and oriented to person, place, and time.  Psychiatric:        Attention and Perception: Attention and perception normal.        Mood and Affect: Mood and affect normal.        Speech: Speech normal.        Behavior: Behavior normal. Behavior is cooperative.        Thought Content: Thought content normal.        Cognition and Memory: Cognition and memory normal.        Judgment: Judgment normal.     Review of Systems  All other systems reviewed and are negative.   Blood pressure 102/68, pulse 74, temperature 98.7 F (37.1 C), temperature source Oral, resp. rate 18, height '5\' 8"'$  (1.727 m), weight 88 kg, last menstrual period 02/08/2022, SpO2 99 %, unknown if currently breastfeeding.Body mass index is 29.5 kg/m.  General Appearance: Casual  Eye Contact:  Good  Speech:  Normal Rate  Volume:  Normal  Mood:   excited  Affect:  Congruent  Thought Process:  Coherent  Orientation:  Full (Time, Place, and Person)  Thought Content:  WDL and Logical  Suicidal Thoughts:  No  Homicidal Thoughts:  No  Memory:  Immediate;   Good Recent;  Good Remote;   Good  Judgement:  WDL  Insight:  Good  Psychomotor Activity:  Normal  Concentration:  Concentration: Good and Attention Span: Good  Recall:  Good  Fund of Knowledge:  Good  Language:  Good  Akathisia:  No  Handed:  Right  AIMS (if indicated):     Assets:  Housing Leisure Time Physical Health Resilience Social Support  ADL's:  Intact  Cognition:  WNL  Sleep:        Physical Exam: Physical Exam Vitals and nursing note reviewed.  Constitutional:      Appearance: Normal appearance.  HENT:     Head: Normocephalic.     Nose: Nose normal.  Pulmonary:     Effort: Pulmonary effort is normal.  Musculoskeletal:         General: Normal range of motion.     Cervical back: Normal range of motion.  Neurological:     General: No focal deficit present.     Mental Status: She is alert and oriented to person, place, and time.  Psychiatric:        Attention and Perception: Attention and perception normal.        Mood and Affect: Mood and affect normal.        Speech: Speech normal.        Behavior: Behavior normal. Behavior is cooperative.        Thought Content: Thought content normal.        Cognition and Memory: Cognition and memory normal.        Judgment: Judgment normal.    Review of Systems  All other systems reviewed and are negative.  Blood pressure 102/68, pulse 74, temperature 98.7 F (37.1 C), temperature source Oral, resp. rate 18, height '5\' 8"'$  (1.727 m), weight 88 kg, last menstrual period 02/08/2022, SpO2 99 %, unknown if currently breastfeeding. Body mass index is 29.5 kg/m.  Treatment Plan Summary: Mood disorder, past history: Family and patient agreeable to come to the ED if mood become unstable  Disposition: No evidence of imminent risk to self or others at present.   Patient does not meet criteria for psychiatric inpatient admission. Supportive therapy provided about ongoing stressors.  Waylan Boga, NP 11/02/2022 2:24 PM

## 2022-11-02 NOTE — Telephone Encounter (Signed)
Called patient left vm advising of weekly appts starting 3/26 at 1:00 each week on Tuesdays. Provided c/b # if any questions.

## 2022-11-02 NOTE — Progress Notes (Signed)
 Anna Frazier is a 27 y.o. female     Chief Complaint   Patient presents with    Follow-up     iron  deficiency anemia.       1. Have you been to the ER, urgent care clinic since your last visit?  Hospitalized since your last visit?No    2. Have you seen or consulted any other health care providers outside of the [name] since your last visit?  Include any pap smears or colon screening. No

## 2022-11-02 NOTE — Progress Notes (Signed)
Patient brought in 21 vial from the pharmacy (that had been ordered by Ali, Sabina, MD ) and the vial was given by injection, patient tolerated well.

## 2022-11-02 NOTE — Progress Notes (Signed)
 [facility]  [address], [address] [place] [address]  W: [phone]  F: [phone]    Reason for Visit:   Anna Frazier is a 27 y.o. female with B12 deficiency who is seen in consultation at the request of Dr. Annalee Lash for evaluation of iron  deficiency anemia.     History of Present Illness:   Anna Frazier is a pleasant 27 y.o. female who presents today for evaluation of IDA.    Patient seen by PCP for routine follow up. Labs from 10/04/22 notable for hemoglobin 9.4, MCV 73, platelets 547, ferritin 17, iron  sat 3%. She was advised to start oral iron  BID. She took it daily but has not been able to tolerate oral iron  due to constipation.   She was hospitalized [facility] for intractable vomiting. FOBT positive. Hemoglobin at that time was 6.9. She required 1 unit PRBCs. GI work up with EGD and colonoscopy negative during hospitalization.  She has never received an iron  infusion.    She was also noted to have low B12 and initiated on B12 injections and oral B12 supplementation.     Menstrual history: regular cycles, menses last 4 days with 1 heavy day, requires pad change 3-4x per day, no clotting. Not on birth control.     She does not eat beef, pork, dairy. She will eat chicken, seafood, and vegetables.    Endorses severe pica.    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 Lives with her sister. Current smoker - black and milds x 10 years. Drinks 12 shots 1-2x per week.     Review of systems was obtained and pertinent findings reviewed above. Past medical history, social history, family history, medications, and allergies are located in the electronic medical record.    Physical Exam:   BP 115/80 (Site: Left Upper Arm, Position: Sitting)   Pulse 87   Resp 18   Wt 64.4 kg (142 lb)   LMP 09/27/2022 (Exact Date)   BMI 27.73 kg/m     General: no distress  Eyes: anicteric sclerae  HENT: oropharynx  clear  Neck: supple  Respiratory: normal respiratory effort  CV: no peripheral edema  Ext: warm, well perfused  GI: soft, nontender, nondistended, no masses  Skin: no rashes, no ecchymoses, no petechiae    Results:     Lab Results   Component Value Date/Time    WBC 9.3 10/07/2022 05:50 PM    HGB 8.8 10/07/2022 05:50 PM    HCT 29.3 10/07/2022 05:50 PM    PLT 407 10/07/2022 05:50 PM    MCV 72.3 10/07/2022 05:50 PM     Lab Results   Component Value Date/Time    NA 140 10/07/2022 05:50 PM    K 3.6 10/07/2022 05:50 PM    CL 106 10/07/2022 05:50 PM    CO2 26 10/07/2022 05:50 PM    BUN 5 10/07/2022 05:50 PM    GFRAA 111 09/28/2020 09:28 AM     Lab Results   Component Value Date/Time    ALT 16 10/07/2022 05:50 PM    GLOB 4.2 10/07/2022 05:50 PM     Records from [facility] reviewed and summarized above.  Test results above have been reviewed.    Assessment:   #) Iron  deficiency anemia, severe:  Hospitalized for severe anemia at [facility] and received 1 unit PRBCs. EGD/colonoscopy there unremarkable.  Unable to tolerate oral iron  due to constipation. Etiology likely due to chronic blood loss from menses (although no menorrhagia) and inadequate oral iron  intake. Given inability to tolerate PO iron , we will proceed with IV iron  now.    #) B12 deficiency:  B12 supplementation per PCP    Plan:     CBC with diff, ferritin, iron  profile now  Screen for celiac disease  Venofer  weekly x 4 doses  Return to clinic in 3 months with repeat labs prior to visit    Signed By: Donald Burkitt, MD

## 2022-11-03 LAB — CBC WITH AUTO DIFFERENTIAL
Basophils %: 0 %
Basophils Absolute: 0 10*3/uL (ref 0.0–0.2)
Eosinophils %: 0 %
Eosinophils Absolute: 0 10*3/uL (ref 0.0–0.4)
Hematocrit: 33.8 % — ABNORMAL LOW (ref 34.0–46.6)
Hemoglobin: 9.8 g/dL — ABNORMAL LOW (ref 11.1–15.9)
Immature Grans (Abs): 0 10*3/uL (ref 0.0–0.1)
Immature Granulocytes: 0 %
Lymphocytes %: 21 %
Lymphocytes Absolute: 2.1 10*3/uL (ref 0.7–3.1)
MCH: 21.7 pg — ABNORMAL LOW (ref 26.6–33.0)
MCHC: 29 g/dL — ABNORMAL LOW (ref 31.5–35.7)
MCV: 75 fL — ABNORMAL LOW (ref 79–97)
Monocytes %: 8 %
Monocytes Absolute: 0.8 10*3/uL (ref 0.1–0.9)
Neutrophils %: 71 %
Neutrophils Absolute: 6.8 10*3/uL (ref 1.4–7.0)
Platelets: 490 10*3/uL — ABNORMAL HIGH (ref 150–450)
RBC: 4.52 x10E6/uL (ref 3.77–5.28)
RDW: 19.9 % — ABNORMAL HIGH (ref 11.7–15.4)
WBC: 9.7 10*3/uL (ref 3.4–10.8)

## 2022-11-03 LAB — IRON AND TIBC
Iron % Saturation: 3 % — CL (ref 15–55)
Iron: 14 ug/dL — ABNORMAL LOW (ref 27–159)
TIBC: 521 ug/dL — ABNORMAL HIGH (ref 250–450)
UIBC: 507 ug/dL — ABNORMAL HIGH (ref 131–425)

## 2022-11-03 LAB — FERRITIN: Ferritin: 10 ng/mL — ABNORMAL LOW (ref 15–150)

## 2022-11-03 NOTE — Discharge Summary (Signed)
OB Discharge Summary     Patient Name: Melissa [name] DOB: 1996/05/10 MRN: [id number]  Date of admission: 10/31/2022 Delivering MD: Rod Can, CNM  Date of Delivery: 11/03/2022  Date of discharge: 11/03/2022  Admitting diagnosis: Encounter for planned induction of labor [Z34.90] Intrauterine pregnancy: [redacted]w[redacted]d    Secondary diagnosis:  intrauterine growth restriction     Discharge diagnosis: Term Pregnancy Delivered                                                                                                Post partum procedures: None  Augmentation: AROM, Pitocin, and IP Foley  Complications: None  Hospital course:  Induction of Labor With Vaginal Delivery   27y.o. yo [id number] admitted to the hospital 10/31/2022 for induction of labor.  Indication for induction:  IUGR .  Patient had an labor course complicated by NA Membrane Rupture Time/Date: 2:23 PM ,11/01/2022   Delivery Method:Vaginal, Spontaneous  Episiotomy: None  Lacerations:  None  Details of delivery can be found in separate delivery note.    Patient had a postpartum course complicated by NA. She is tolerating regular diet, her pain is controlled with PO medication, she is ambulating and voiding without difficulty. She has been evaluated and counseled by Melissa [name] psych history and concerns. She has been seen by SW and has f/u with CPS due to open case with her first child. Please see chart notes. Patient is encouraged to follow up with her therapist and to have support person with her. Sleep and support are encouraged.   Patient is discharged home 11/03/22.  Newborn Data: Birth date:11/01/2022  Birth time:4:37 PM  Gender:Female  Living status:Living  Apgars:8 ,9  Weight:2310 g   Physical exam  Vitals:   11/02/22 1122 11/02/22 1600 11/02/22 2340 11/03/22 0736  BP: 102/68 118/76 112/69 109/74  Pulse: 74 68 64 77  Resp: '18 18 18 15  '$ Temp: 98.7 F (37.1 C) 98.6 F (37 C) 97.9 F (36.6 C)  98.8 F (37.1 C)  TempSrc: Oral Oral Oral Axillary  SpO2:   99% 100%  Weight:      Height:       General: alert, cooperative, and no distress Lochia: appropriate Uterine Fundus: firm Incision: N/A DVT Evaluation: No evidence of DVT seen on physical exam.  Labs: Lab Results  Component Value Date   WBC 9.4 11/02/2022   HGB 10.6 (L) 11/02/2022   HCT 31.9 (L) 11/02/2022   MCV 85.8 11/02/2022   PLT 194 11/02/2022    Discharge instruction: per After Visit Summary.  Medications:  Allergies as of 11/03/2022   No Known Allergies      Medication List     TAKE these medications    prenatal multivitamin Tabs tablet Take 1 tablet by mouth daily at 12 noon.        Diet: routine diet  Activity: Advance as tolerated. Pelvic rest for 6 weeks.   Outpatient follow up:  Follow-up Information     Melissa [name]. Schedule an appointment as soon as possible for a visit.  Specialties: Obstetrics, Gynecology Why: Make an appointment for a 2 week post delivery video call visit. Make another appointment for a 6 week post delivery physical. If you are planning on getting an IUD or Nexplanon, then  tell the scheduler, and this will be done at the 6 week post delivery visit. Contact information: [address] [place] [address] [phone]                   Postpartum contraception: None declines Rhogam Given postpartum: Rh positive Rubella vaccine given postpartum: immune Varicella vaccine given postpartum: immune TDaP given antepartum or postpartum: given antepartum    Newborn Delivery   Birth date/time: 11/01/2022 16:37:00 Delivery type: Vaginal, Spontaneous       Baby Feeding:  Formula, she may pump at home  Disposition:home with mother  SIGNED:  Rod Can, CNM 11/03/2022 11:19 AM

## 2022-11-03 NOTE — Lactation Note (Addendum)
This note was copied from a baby's chart. Lactation Consultation Note  Patient Name: Melissa [name] [id number] Date: 11/03/2022 Age:29 hours Reason for consult: Follow-up assessment;Early term 26-38.6wks   Maternal Data  States she hasn't pumped, plans on only feeding formula now, may try to use her own pump at home Feeding Mother's Current Feeding Choice: Formula Requests more formula for home use, until she can go to [name] on Monday, 4 bottles of Neosure given, reviewed storage of formula and when to discard formula LATCH Score                    Lactation Tools Discussed/Used    Interventions  LC name and no written on white board  Discharge Discharge Education: Engorgement and breast care Pump: Personal [place] Program: Yes  Consult Status Consult Status: PRN    Ferol Luz 11/03/2022, 11:16 AM

## 2022-11-03 NOTE — Progress Notes (Signed)
Spoke with TOC via secure chat. Per the LCSW no further needs at this time and it is ok to d/c patient and baby home.  Pt is to Follow up with her [place] Provider on Friday of next week per patient.  [facility] [place] script given to patient.   Patient discharged home with family.  Discharge instructions, when to follow up, and prescriptions reviewed with patient and family memebr present.  Patient verbalized understanding. Patient will be escorted out by staff.Marland Kitchen

## 2022-11-05 ENCOUNTER — Telehealth: Payer: Self-pay

## 2022-11-05 NOTE — Telephone Encounter (Signed)
CPS called to inquire if patient was discharged, confirmed discharge on 3/9. All CPS questions answered including information in psych notes, they requested copy and CSW told them they will have to go through medical records.   TOC supervisor informed of above as no toc note in from 3/9 discharge and no cps follow up noted on that day. TOC supervisor reports he will follow up.   Kelby Fam, [place], MSW, [place]

## 2022-11-05 NOTE — Telephone Encounter (Signed)
1017     Patient HIPAA verified x2.     Patient has been notified  her labs confirm ongoing iron deficiency anemia. And Dr.Raybould will proceed with the 4 doses of IV iron as discussed in clinic.     Patient verbalized understanding and has no question or concern at this time

## 2022-11-06 LAB — CELIAC DISEASE PANEL
Endomysial IgA: NEGATIVE
IgA: 549 mg/dL — ABNORMAL HIGH (ref 87–352)
Tissue Transglutaminase IgA: <2 U/mL (ref 0–3)

## 2022-11-06 NOTE — Telephone Encounter (Signed)
1517     Patient HIPAA verified x2     Called patient and let her know her testing for Celiac disease as a cause of her iron deficiency anemia was negative.     Patient verbalized understanding and has no question or concerns a this time

## 2022-11-08 ENCOUNTER — Encounter (HOSPITAL_COMMUNITY): Payer: Self-pay

## 2022-11-08 ENCOUNTER — Ambulatory Visit (INDEPENDENT_AMBULATORY_CARE_PROVIDER_SITE_OTHER): Payer: Medicaid Other | Admitting: Mental Health

## 2022-11-08 ENCOUNTER — Ambulatory Visit (INDEPENDENT_AMBULATORY_CARE_PROVIDER_SITE_OTHER): Payer: Medicaid Other | Admitting: Student

## 2022-11-08 ENCOUNTER — Encounter: Payer: Medicaid Other | Admitting: Advanced Practice Midwife

## 2022-11-08 DIAGNOSIS — F251 Schizoaffective disorder, depressive type: Secondary | ICD-10-CM

## 2022-11-08 MED ORDER — ARIPIPRAZOLE 5 MG PO TABS: 5.0000 mg | ORAL_TABLET | Freq: Two times a day (BID) | ORAL | 0 refills | Status: DC

## 2022-11-08 NOTE — Progress Notes (Signed)
Psychiatric Initial Adult Assessment  Patient Identification: Melissa [name] MRN:  [id number] Date of Evaluation:  11/10/2022 Referral Source: self  Assessment:  Melissa [name] is a 27 y.o. female with a history of schizoaffective disorder, depressive type who presents in person to [facility] for medication management of schizoaffective disorder.  Patient reports she is currently doing well. She came in at the behest of DSS case worker for her to be restarted on her psychotropic medication. We discussed medication options and she elected to initiate abilify 5 mg bid with plan to follow up in 2 weeks with the [facility] to start [place]. She was pleasant throughout visit but hesitant and guarded regarding her history and need for medication. She agreed for me to speak with father whom she lives with currently and father understood plan and verbalized safety plan to ensure safety of both baby and patient. He is aware of plan outlined and will be closely monitoring for any symptoms of depression, psychosis, or mania. Patient to follow up at [facility] clinic on 11/21/22. Close monitoring and follow up will be beneficial for this patient. Patient is not breastfeeding so there is no risk for exposure of abilify to baby.  Plan:  # Schizoaffective Disorder, Depressive Type Past medication trials:  Status of problem: stable Interventions: --START Abilify 5 mg bid --Plan for Abilify Maintena on 11/21/22 --Consider outpatient commitment if necessary for safety of patient and baby  Patient was given contact information for behavioral health clinic and was instructed to call 911 for emergencies.   Subjective:  Chief Complaint:  Chief Complaint  Patient presents with   Depression    History of Present Illness:   Patient presents to outpatient psychiatry clinic at the behest of DSS.  She presents with 7-day old newborn.  She has history of schizoaffective depressive type.  She reports  she has had manic and depressive symptoms in the past but it has been some years since that has been a severe problem.  Patient reports feeling "really okay" right now and states she has many coping skills to address her mood disturbances.  She reports not having had a "slip up" for some time now.  She occasionally does endorse some impulsive thoughts such as throwing something at TV or TV a wall but denies having done anything like that for some time now.  Patient's last psychiatric hospitalization was at [facility] in 2022 5 days after giving birth to her first child.  She reported having suicidal thoughts as well as homicidal thoughts of baby and was diagnosed with postpartum depression at that time.  She reports that she feels more psychiatrically stable at this point compared to last pregnancy.  She denies present suicidal/homicidal ideation nor any psychotic symptoms.  She denies feelings of depression or anxiety at this time.  She reported some guilt regarding not being able to feed.  Be what she eats but denies any other mood symptoms.  She endorses having depressive symptoms in the past but she states she is overall doing well.  She had self discontinued from her Abilify medication when she found out she was pregnant.  Patient currently lives with her parents in a room of her own with newborn.  She was agreeable for me to speak with father in order to obtain collateral information.  I discussed restarting oral Abilify with plan to get Abilify LAI in approximately 2 weeks.  While patient was hesitant about this as she feels she does not  really need psychotropics, she did agree to take the medications as she felt that this would help ensure her own stability as well as safety of newborn.  Spoke with Tyson Dense over phone after visit: Timber Lickert confirms that he is Melissa [name]'s father and that she is currently living with him.  Father reports that patient has been relatively stable in terms  of mental health although she will occasionally have some temper tantrums and talk to herself although he does not know whether this is internal stimuli or self talk.  He reports no safety concerns at this point but is also very cautious in order to ensure the safety of her daughter as well as newborn.  He states he will be observing patient closely to ensure safety of patient and newborn. I discussed current medication regiment and plan with father including starting Abilify 5 mg twice daily with plan to get Abilify Maintena injection in approximately 2 weeks.  Father verbalized understanding but admits that he will have very little control over whether patient takes oral psychotropics.  He voices that patient has previously stated she is very hesitant about psychotropic medications.  All questions were addressed during phone call.  Past Psychiatric History:  Diagnoses: Schizoaffective disorder, depression, anxiety Medication trials: Abilify LAI, zoloft, other mood stabilizers Previous psychiatrist/therapist: Dr. Jacinto Reap at [facility], Ms Tammy Vine in [place] Hospitalizations: ~8 Suicide attempts: endorses SIB: endorses Hx of violence towards others: denies Current access to guns: denies Hx of abuse: endorses  Substance Abuse History in the last 12 months:  No.  Past Medical History:  Past Medical History:  Diagnosis Date   Anxiety    Asthma    Bipolar 1 disorder ([place])    Chlamydia    Collapsed lung 06/11/2020   Depression    Dizziness    Gonorrhea    PTSD (post-traumatic stress disorder)    Rib fracture    Syncope     Past Surgical History:  Procedure Laterality Date   chest tube  06/11/2020    Family History:  Family History  Problem Relation Age of Onset   Asthma Mother    Hypertension Father    Cancer Neg Hx    Diabetes Neg Hx    Heart disease Neg Hx    Stroke Neg Hx     Social History:   Social History   Socioeconomic History   Marital status: Single    Spouse name:  Not on file   Number of children: 1   Years of education: 12   Highest education level: Some college, no degree  Occupational History   Occupation: unemployed  Tobacco Use   Smoking status: Never   Smokeless tobacco: Never  Vaping Use   Vaping Use: Never used  Substance and Sexual Activity   Alcohol use: Not Currently   Drug use: Not Currently    Frequency: 5.0 times per week    Types: Marijuana    Comment: 7 years   Sexual activity: Yes    Partners: Male    Birth control/protection: None  Other Topics Concern   Not on file  Social History Narrative   Not on file   Social Determinants of Health   Financial Resource Strain: Low Risk  (05/31/2022)   Overall Financial Resource Strain (CARDIA)    Difficulty of Paying Living Expenses: Not hard at all  Food Insecurity: No Food Insecurity (10/31/2022)   Hunger Vital Sign    Worried About Running Out of Food in the Last Year:  Never true    Ran Out of Food in the Last Year: Never true  Transportation Needs: No Transportation Needs (10/31/2022)   PRAPARE - Hydrologist (Medical): No    Lack of Transportation (Non-Medical): No  Physical Activity: Inactive (05/31/2022)   Exercise Vital Sign    Days of Exercise per Week: 0 days    Minutes of Exercise per Session: 0 min  Stress: No Stress Concern Present (05/31/2022)   [place]    Feeling of Stress : Not at all  Social Connections: Socially Isolated (11/08/2022)   Social Connection and Isolation Panel [NHANES]    Frequency of Communication with Friends and Family: More than three times a week    Frequency of Social Gatherings with Friends and Family: Once a week    Attends Religious Services: Never    Marine scientist or Organizations: No    Attends Music therapist: Never    Marital Status: Never married     Allergies:  No Known Allergies  Current  Medications: Current Outpatient Medications  Medication Sig Dispense Refill   ARIPiprazole (ABILIFY) 5 MG tablet Take 1 tablet (5 mg total) by mouth 2 (two) times daily. 60 tablet 0   Prenatal Vit-Fe Fumarate-FA (PRENATAL MULTIVITAMIN) TABS tablet Take 1 tablet by mouth daily at 12 noon.     No current facility-administered medications for this visit.    ROS: Review of Systems  Objective:  Psychiatric Specialty Exam: unknown if currently breastfeeding.There is no height or weight on file to calculate BMI.  General Appearance: Fairly Groomed  Eye Contact:  Good  Speech:  Clear and Coherent and Normal Rate  Volume:  Normal  Mood:  Euthymic  Affect:  Appropriate, Congruent, and Full Range  Thought Content: WDL   Suicidal Thoughts:  No  Homicidal Thoughts:  No  Thought Process:  Coherent, Goal Directed, and Linear  Orientation:  Full (Time, Place, and Person)    Memory:  Remote;   Good  Judgment:  Fair  Insight:  Fair  Concentration:  Concentration: Good and Attention Span: Good  Recall:  Good  Fund of Knowledge: Fair  Language: Fair  Psychomotor Activity:  Normal  Akathisia:  No  AIMS (if indicated): not done  Assets:  Communication Skills Desire for Improvement Housing Leisure Time [place] Talents/Skills Transportation  ADL's:  Intact  Cognition: WNL  Sleep:  Good   PE: General: well-appearing; no acute distress  Pulm: no increased work of breathing on room air  Strength & Muscle Tone: within normal limits Neuro: no focal neurological deficits observed  Gait & Station: normal  Metabolic Disorder Labs: Lab Results  Component Value Date   HGBA1C 4.8 07/22/2016   MPG 91 07/22/2016   Lab Results  Component Value Date   PROLACTIN 6.6 07/22/2016   Lab Results  Component Value Date   CHOL 126 07/22/2016   TRIG 49 07/22/2016   HDL 47 07/22/2016   CHOLHDL 2.7 07/22/2016   VLDL 10 07/22/2016   LDLCALC 69 07/22/2016    Lab Results  Component Value Date   TSH 0.794 07/27/2016    Therapeutic Level Labs: No results found for: "LITHIUM" No results found for: "CBMZ" No results found for: "VALPROATE"  Screenings:  AIMS    Flowsheet Row Admission (Discharged) from 07/19/2016 in [place] Admission (Discharged) from 05/04/2016 in [place]  Total Score 0 0      AUDIT    Flowsheet Row Admission (Discharged) from 07/19/2016 in [place] Admission (Discharged) from 05/04/2016 in [facility]  Alcohol Use Disorder Identification Test Final Score (AUDIT) 0 0      CAGE-AID    Flowsheet Row ED to Hosp-Admission (Discharged) from 06/12/2020 in [place] Score 0      GAD-7    Flowsheet Row Counselor from 11/08/2022 in [name] Office Visit from 10/31/2018 in [facility] Office Visit from 05/30/2018 in [place] for [name]  Total GAD-7 Score 2 0 0      PHQ2-9    Flowsheet Row Counselor from 11/08/2022 in [facility] Office Visit from 04/19/2022 in [place] Office Visit from 10/31/2018 in [facility] [place] Office Visit from 05/30/2018 in [place] for [facility]  PHQ-2 Total Score 0 0 0 0  PHQ-9 Total Score 0 -- 1 1      Flowsheet Row Counselor from 11/08/2022 in [facility] Admission (Discharged) from 10/31/2022 in [facility] ED from 05/29/2022 in [name] Emergency Department at [place] No Risk No Risk No Risk       Collaboration of Care: Collaboration of Care:   Patient/Guardian was advised Release of Information must be obtained prior to any record  release in order to collaborate their care with an outside provider. Patient/Guardian was advised if they have not already done so to contact the registration department to sign all necessary forms in order for [place] to release information regarding their care.   Consent: Patient/Guardian gives verbal consent for treatment and assignment of benefits for services provided during this visit. Patient/Guardian expressed understanding and agreed to proceed.   A total of 45 minutes was spent involved in face to face clinical care, chart review, and documentation.   France Ravens, MD 3/16/20242:33 PM
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
## 2022-11-10 ENCOUNTER — Encounter (HOSPITAL_COMMUNITY): Payer: Self-pay | Admitting: Student

## 2022-11-15 MED FILL — VENOFER 20 MG/ML IV SOLN: 20 MG/ML | INTRAVENOUS | Qty: 15

## 2022-11-20 ENCOUNTER — Inpatient Hospital Stay: Payer: PRIVATE HEALTH INSURANCE | Primary: Internal Medicine

## 2022-11-20 ENCOUNTER — Telehealth: Payer: Medicaid Other | Admitting: Obstetrics

## 2022-11-20 NOTE — Progress Notes (Unsigned)
Edinburgh Postnatal Depression Scale - 11/20/22 1520       Edinburgh Postnatal Depression Scale:  In the Past 7 Days   I have been able to laugh and see the funny side of things. 0    I have looked forward with enjoyment to things. 0    I have blamed myself unnecessarily when things went wrong. 1    I have been anxious or worried for no good reason. 0    I have felt scared or panicky for no good reason. 0    Things have been getting on top of me. 0    I have been so unhappy that I have had difficulty sleeping. 0    I have felt sad or miserable. 0    I have been so unhappy that I have been crying. 0    The thought of harming myself has occurred to me. 0    Edinburgh Postnatal Depression Scale Total 1

## 2022-11-21 ENCOUNTER — Ambulatory Visit (HOSPITAL_COMMUNITY): Payer: Medicaid Other

## 2022-11-22 ENCOUNTER — Telehealth (HOSPITAL_COMMUNITY): Payer: Self-pay | Admitting: *Deleted

## 2022-11-22 MED FILL — VENOFER 20 MG/ML IV SOLN: 20 MG/ML | INTRAVENOUS | Qty: 15

## 2022-11-22 NOTE — Telephone Encounter (Signed)
Dr Dwyane Dee noticed patient did not show up for her shot yesterday. I called her to ask her to reschedul her shot. She is to come in tomorrow am at 830 for her first inj. For her compliance and stability. She is involved with DSS and has a 24 week old child.

## 2022-11-22 NOTE — Progress Notes (Unsigned)
Missed yesterdays shot appt. I called her today with Dr Dwyane Dee to reschedule her shot. She asked to continue with pills but she was told due to her young child and her involvement with DSS and her losing custody of her other child she needs to start with a shot for compliance and stability. She agreed to come in tomorrow am at 830 for her shot.

## 2022-11-23 ENCOUNTER — Ambulatory Visit (INDEPENDENT_AMBULATORY_CARE_PROVIDER_SITE_OTHER): Payer: Medicaid Other | Admitting: Student in an Organized Health Care Education/Training Program

## 2022-11-23 ENCOUNTER — Ambulatory Visit (INDEPENDENT_AMBULATORY_CARE_PROVIDER_SITE_OTHER): Payer: Medicaid Other

## 2022-11-23 ENCOUNTER — Encounter (HOSPITAL_COMMUNITY): Payer: Self-pay

## 2022-11-23 ENCOUNTER — Telehealth (HOSPITAL_COMMUNITY): Payer: Self-pay | Admitting: *Deleted

## 2022-11-23 ENCOUNTER — Encounter (HOSPITAL_COMMUNITY): Payer: Self-pay | Admitting: Student in an Organized Health Care Education/Training Program

## 2022-11-23 VITALS — BP 119/84 | HR 80 | Ht 68.0 in | Wt 190.0 lb

## 2022-11-23 VITALS — BP 119/84 | HR 80 | Resp 16 | Ht 68.0 in | Wt 190.0 lb

## 2022-11-23 DIAGNOSIS — F251 Schizoaffective disorder, depressive type: Secondary | ICD-10-CM

## 2022-11-23 MED ORDER — ARIPIPRAZOLE ER 400 MG IM PRSY
400.0000 mg | PREFILLED_SYRINGE | Freq: Once | INTRAMUSCULAR | Status: AC
  Administered 2022-11-23: 400 mg via INTRAMUSCULAR

## 2022-11-23 MED ORDER — ARIPIPRAZOLE ER 400 MG IM PRSY: 400.0000 mg | PREFILLED_SYRINGE | INTRAMUSCULAR | 4 refills | Status: DC

## 2022-11-23 NOTE — Progress Notes (Signed)
Patient arrived for injection of Abilify Maintena 400mg  given in Left Deltoid. No side effects noted. No issues or complaints. Tolerated well. Apologized for missing appointment on 11/23/22.

## 2022-11-23 NOTE — Telephone Encounter (Signed)
Called [place] [facility] for prior authorization of Abilify Maintena 400mg . Spoke with Dasia who gave approval from 11/23/22-11/18/23. Approval [id number].

## 2022-11-23 NOTE — Progress Notes (Signed)
[facility] MD/PA/NP OP Progress Note  11/23/2022 9:30 AM HENESSEY VANBUREN  MRN:  [id number]  Chief Complaint:  Chief Complaint  Patient presents with   Follow-up   Schizoaffective Disorder   HPI:  Megon A. Fordice is a 27 yr old female who presents for Follow Up and Medication Management.  PPHx is significant for Schizoaffective Disorder, Derpessive Type and Anxiety, and Previous Suicide Attempts, and 8 prior Psychiatric Hospitalizations (last [facility] 2022).   She reports that she has been doing well since her last appointment.  She reports that she has tolerated starting the Abilify well.  She reports no side effects to the Abilify.  She reports she missed the injection clinic the other day because she slept in.  When asked if she would like to receive the Abilify Maintena today initially she stated that she wanted to continue with oral medication.  Asked if I would be able to call her father to speak with him and she was agreeable with this.   Called her father, Kalyssa Brozek, [phone].  He reports that Enis Gash has been doing good.  He reports she has been real attentive with the baby and doing everything a mother with the newborn would.  He reports she has not had any outbursts and he has not seen any of the "'ups and downs" that she had previously.  When discussing her request to not take the injection and continue with the oral medication he reports he thinks this would be a bad idea.  He reports that while she has been doing good he is not sure if she is taking the medicine.  He reports that if she has an episode and he is not there he does have worries of what might happen and states that if DSS does take her child as they took her first child she would not be able to stay at their house anymore.  He reports he thinks she definitely needs to be on the injection to ensure she is taking it.  He reports no concerns at present.   After the call with her father and further discussion she  was agreeable to taking the Abilify Maintena injection today.  This was then given to her.  Discussed with her that we would keep the appointment scheduled for her on April 9.  Discussed that she would need to continue to take the oral Abilify for 2 more weeks despite receiving the injection today and she reported understanding.  Discussed we would get her enrolled in the injection clinic.  She reports no SI, HI, or AVH.  She reports her appetite is fine.  She reports her sleep is good.  She reports no other concerns at present.  She will return for follow-up on April 9.   Visit Diagnosis:    ICD-10-CM   1. Schizoaffective disorder, depressive type ([place])  F25.1 ARIPiprazole ER (ABILIFY MAINTENA) 400 MG prefilled syringe 400 mg    ARIPiprazole ER (ABILIFY MAINTENA) 400 MG PRSY prefilled syringe      Past Psychiatric History: Schizoaffective Disorder, Derpessive Type and Anxiety, and Previous Suicide Attempts, and 8 prior Psychiatric Hospitalizations (last [facility] 2022).  Past Medical History:  Past Medical History:  Diagnosis Date   Anxiety    Asthma    Bipolar 1 disorder ([place])    Chlamydia    Collapsed lung 06/11/2020   Depression    Dizziness    Gonorrhea    PTSD (post-traumatic stress disorder)    Rib fracture  Syncope     Past Surgical History:  Procedure Laterality Date   chest tube  06/11/2020    Family Psychiatric History: None Reported  Family History:  Family History  Problem Relation Age of Onset   Asthma Mother    Hypertension Father    Cancer Neg Hx    Diabetes Neg Hx    Heart disease Neg Hx    Stroke Neg Hx     Social History:  Social History   Socioeconomic History   Marital status: Single    Spouse name: Not on file   Number of children: 1   Years of education: 12   Highest education level: Some college, no degree  Occupational History   Occupation: unemployed  Tobacco Use   Smoking status: Never   Smokeless tobacco: Never  Vaping Use    Vaping Use: Never used  Substance and Sexual Activity   Alcohol use: Not Currently   Drug use: Not Currently    Frequency: 5.0 times per week    Types: Marijuana    Comment: 7 years   Sexual activity: Yes    Partners: Male    Birth control/protection: None  Other Topics Concern   Not on file  Social History Narrative   Not on file   Social Determinants of Health   Financial Resource Strain: Low Risk  (05/31/2022)   Overall Financial Resource Strain (CARDIA)    Difficulty of Paying Living Expenses: Not hard at all  Food Insecurity: No Food Insecurity (10/31/2022)   Hunger Vital Sign    Worried About Running Out of Food in the Last Year: Never true    Ran Out of Food in the Last Year: Never true  Transportation Needs: No Transportation Needs (10/31/2022)   PRAPARE - Hydrologist (Medical): No    Lack of Transportation (Non-Medical): No  Physical Activity: Inactive (05/31/2022)   Exercise Vital Sign    Days of Exercise per Week: 0 days    Minutes of Exercise per Session: 0 min  Stress: No Stress Concern Present (05/31/2022)   [place]    Feeling of Stress : Not at all  Social Connections: Socially Isolated (11/08/2022)   Social Connection and Isolation Panel [NHANES]    Frequency of Communication with Friends and Family: More than three times a week    Frequency of Social Gatherings with Friends and Family: Once a week    Attends Religious Services: Never    Marine scientist or Organizations: No    Attends Music therapist: Never    Marital Status: Never married    Allergies: No Known Allergies  Metabolic Disorder Labs: Lab Results  Component Value Date   HGBA1C 4.8 07/22/2016   MPG 91 07/22/2016   Lab Results  Component Value Date   PROLACTIN 6.6 07/22/2016   Lab Results  Component Value Date   CHOL 126 07/22/2016   TRIG 49 07/22/2016   HDL 47  07/22/2016   CHOLHDL 2.7 07/22/2016   VLDL 10 07/22/2016   Clements 69 07/22/2016   Lab Results  Component Value Date   TSH 0.794 07/27/2016   TSH 2.139 10/21/2015    Therapeutic Level Labs: No results found for: "LITHIUM" No results found for: "VALPROATE" No results found for: "CBMZ"  Current Medications: Current Outpatient Medications  Medication Sig Dispense Refill   ARIPiprazole ER (ABILIFY MAINTENA) 400 MG PRSY prefilled syringe Inject 400 mg into  the muscle every 28 (twenty-eight) days. 1 each 4   ARIPiprazole (ABILIFY) 5 MG tablet Take 1 tablet (5 mg total) by mouth 2 (two) times daily. 60 tablet 0   Prenatal Vit-Fe Fumarate-FA (PRENATAL MULTIVITAMIN) TABS tablet Take 1 tablet by mouth daily at 12 noon.     Current Facility-Administered Medications  Medication Dose Route Frequency Provider Last Rate Last Admin   ARIPiprazole ER (ABILIFY MAINTENA) 400 MG prefilled syringe 400 mg  400 mg Intramuscular Once Ercel Normoyle, Redgie Grayer, MD         Musculoskeletal: Strength & Muscle Tone: within normal limits Gait & Station: normal Patient leans: N/A  Psychiatric Specialty Exam: Review of Systems  Respiratory:  Negative for shortness of breath.   Cardiovascular:  Negative for chest pain.  Gastrointestinal:  Negative for abdominal pain, constipation, diarrhea, nausea and vomiting.  Neurological:  Negative for dizziness, weakness and headaches.  Psychiatric/Behavioral:  Negative for dysphoric mood, hallucinations, sleep disturbance and suicidal ideas. The patient is not nervous/anxious.     Blood pressure 119/84, pulse 80, height 5\' 8"  (1.727 m), weight 190 lb (86.2 kg), SpO2 100 %, unknown if currently breastfeeding.Body mass index is 28.89 kg/m.  General Appearance: Casual and Fairly Groomed  Eye Contact:  Fair  Speech:  Clear and Coherent and Normal Rate  Volume:  Normal  Mood:   "ok"  Affect:   Guarded  Thought Process:  Goal Directed  Orientation:  Full (Time, Place,  and Person)  Thought Content: WDL and Logical   Suicidal Thoughts:  No  Homicidal Thoughts:  No  Memory:  Immediate;   Good Recent;   Good  Judgement:  Poor  Insight:  Shallow  Psychomotor Activity:  Normal  Concentration:  Concentration: Fair and Attention Span: Fair  Recall:  [employer] of Knowledge: Fair  Language: Fair  Akathisia:  Negative  Handed:  Right  AIMS (if indicated): not done  Assets:  Communication Skills Desire for [employer] Support Talents/Skills Transportation  ADL's:  Intact  Cognition: WNL  Sleep:  Good   Screenings: AIMS    Flowsheet Row Admission (Discharged) from 07/19/2016 in [place] Admission (Discharged) from 05/04/2016 in [place] [facility]  AIMS Total Score 0 0      AUDIT    Flowsheet Row Admission (Discharged) from 07/19/2016 in [place] Admission (Discharged) from 05/04/2016 in [place] [facility]  Alcohol Use Disorder Identification Test Final Score (AUDIT) 0 0      CAGE-AID    Flowsheet Row ED to Hosp-Admission (Discharged) from 06/12/2020 in [place] Score 0      GAD-7    Flowsheet Row Counselor from 11/08/2022 in [facility] Office Visit from 10/31/2018 in [place] Office Visit from 05/30/2018 in [place] for [facility]  Total GAD-7 Score 2 0 0      PHQ2-9    Flowsheet Row Counselor from 11/08/2022 in [facility] Office Visit from 04/19/2022 in [place] Office Visit from 10/31/2018 in [place] Office Visit from 05/30/2018 in [place] for [facility]  PHQ-2 Total Score 0 0 0 0  PHQ-9 Total Score 0 -- 1 1      Flowsheet Row Counselor from  11/08/2022 in [facility] Admission (Discharged) from  10/31/2022 in [place] ED from 05/29/2022 in [name] Emergency Department at [place] No Risk No Risk No Risk        Assessment and Plan:  Aanvi A. Hedinger is a 27 yr old female who presents for Follow Up and Medication Management.  PPHx is significant for Schizoaffective Disorder, Derpessive Type and Anxiety, and Previous Suicide Attempts, and 8 prior Psychiatric Hospitalizations (last [facility] 2022).   Dionicia reports she has been doing ok on her oral Abilify.  Initially she was not wanting to start the [place], however, after talking with her father and further discussion she was agreeable and received her Abilify Maintena.  Instructed her to continue taking her oral Abilify for 2 more weeks.  We will not make any other changes to her medications at this time.  She will return for follow up on April 9.   Schizoaffective Disorder, Depressive Type: -Received Abilify Maintena 400 mg today (3/29). -Next Abilify Maintena 400 mg IM scheduled for 4/25.  5 refills sent. -Continue oral Abilify 5 mg BID for 14 days. No refills sent at this time.   Collaboration of Care:   Patient/Guardian was advised Release of Information must be obtained prior to any record release in order to collaborate their care with an outside provider. Patient/Guardian was advised if they have not already done so to contact the registration department to sign all necessary forms in order for [place] to release information regarding their care.   Consent: Patient/Guardian gives verbal consent for treatment and assignment of benefits for services provided during this visit. Patient/Guardian expressed understanding and agreed to proceed.    Briant Cedar, MD 11/23/2022, 9:30 AM

## 2022-11-27 ENCOUNTER — Inpatient Hospital Stay: Admit: 2022-11-27 | Payer: PRIVATE HEALTH INSURANCE | Primary: Internal Medicine

## 2022-11-27 ENCOUNTER — Ambulatory Visit (HOSPITAL_COMMUNITY): Payer: Medicaid Other | Admitting: Psychiatry

## 2022-11-27 DIAGNOSIS — D508 Other iron deficiency anemias: Secondary | ICD-10-CM

## 2022-11-27 MED ORDER — STERILE WATER FOR INJECTION (MIXTURES ONLY)
2 MG | INTRAMUSCULAR | Status: AC | PRN
Start: 2022-11-27 — End: 2022-11-28

## 2022-11-27 MED ORDER — IRON SUCROSE 20 MG/ML IV SOLN
20 | Freq: Once | INTRAVENOUS | Status: AC
Start: 2022-11-27 — End: 2022-11-27
  Administered 2022-11-27: 18:00:00 300 mg via INTRAVENOUS

## 2022-11-27 MED ORDER — SODIUM CHLORIDE 0.9 % IV SOLN
0.9 % | INTRAVENOUS | Status: AC | PRN
Start: 2022-11-27 — End: 2022-11-28

## 2022-11-27 MED ORDER — NORMAL SALINE FLUSH 0.9 % IV SOLN
0.9 % | INTRAVENOUS | Status: AC | PRN
Start: 2022-11-27 — End: 2022-11-28

## 2022-11-27 MED ORDER — SODIUM CHLORIDE 0.9 % IV SOLN
0.9 % | INTRAVENOUS | Status: AC | PRN
Start: 2022-11-27 — End: 2022-11-28
  Administered 2022-11-27: 17:00:00 50 mL/h via INTRAVENOUS

## 2022-11-27 MED ORDER — HEPARIN NA (PORK) LOCK FLSH PF 100 UNIT/ML IV SOLN
100 UNIT/ML | INTRAVENOUS | Status: AC | PRN
Start: 2022-11-27 — End: 2022-11-28

## 2022-11-27 MED FILL — IRON SUCROSE 20 MG/ML IV SOLN: 20 MG/ML | INTRAVENOUS | Qty: 15

## 2022-11-27 NOTE — Progress Notes (Signed)
[facility] Short Note                       Date: November 27, 2022    Name: Anna Frazier    MRN: [id number]         DOB: [birth year]    1300 Pt admit to [facility] for Dose #1 44f 4 planned doses of Venofer ambulatory in stable condition. Assessment completed. No new concerns voiced.  Anna Frazier vitals were reviewed prior to treatment.   Patient Vitals for the past 12 hrs:   Temp Pulse Resp BP   11/27/22 1527 -- 87 -- 112/62   11/27/22 1300 97.6 F (36.4 C) 83 18 122/69       PIV (left ac) with positive blood return. flushed, and de-accessed per protocol.       Medications given:   Medications Administered         0.9 % sodium chloride infusion Admin Date  11/27/2022 Action  New Bag Dose  50 mL/hr Rate  50 mL/hr Route  IntraVENous Administered By  Avelino Leeds, RN        iron sucrose (VENOFER) 300 mg in sodium chloride 0.9 % 250 mL IVPB Admin Date  11/27/2022 Action  New Bag Dose  300 mg Rate  193.3 mL/hr Route  IntraVENous Administered By  Avelino Leeds, RN        iron sucrose (VENOFER) 300 mg in sodium chloride 0.9 % 250 mL IVPB Admin Date  11/27/2022 Action  Rate/Dose Verify Dose   Rate  193.3 mL/hr Route  IntraVENous Administered By  Avelino Leeds, RN               [phone] Pt tolerated treatment well. D/c home ambulatory in no distress. Pt aware of next appointment scheduled for 12/04/22.    Future Appointments   Date Time Provider Kathryn   12/04/2022  1:00 PM G2 BRE FASTRACK [facility]   12/11/2022  1:00 PM G1 BRE FASTRACK [facility] [place]   12/18/2022  1:00 PM G1 BRE [facility]ACK [facility]   02/08/2023 11:45 AM O'Laughlin, Marissa Calamity, APRN - CNP [address] [facility]       Avelino Leeds, RN  November 27, 2022  3:37 PM

## 2022-11-27 NOTE — Telephone Encounter (Signed)
TC from patient needing a copy of her lab slip and to confirm when she needed to get them done.  Confirm with team that she needs the lab work done before her June appt.  Patient understood and confirm address.  Labs Order were put in the mail.

## 2022-11-29 MED FILL — VENOFER 20 MG/ML IV SOLN: 20 MG/ML | INTRAVENOUS | Qty: 15
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
## 2022-12-04 ENCOUNTER — Inpatient Hospital Stay: Admit: 2022-12-04 | Payer: PRIVATE HEALTH INSURANCE | Primary: Internal Medicine

## 2022-12-04 ENCOUNTER — Encounter (HOSPITAL_COMMUNITY): Payer: Self-pay | Admitting: Psychiatry

## 2022-12-04 ENCOUNTER — Ambulatory Visit (INDEPENDENT_AMBULATORY_CARE_PROVIDER_SITE_OTHER): Payer: Medicaid Other | Admitting: Psychiatry

## 2022-12-04 VITALS — BP 120/90 | HR 73 | Resp 16 | Ht 68.0 in | Wt 187.5 lb

## 2022-12-04 DIAGNOSIS — Z79899 Other long term (current) drug therapy: Secondary | ICD-10-CM | POA: Diagnosis not present

## 2022-12-04 DIAGNOSIS — F250 Schizoaffective disorder, bipolar type: Secondary | ICD-10-CM

## 2022-12-04 DIAGNOSIS — D508 Other iron deficiency anemias: Secondary | ICD-10-CM

## 2022-12-04 MED ORDER — DIPHENHYDRAMINE HCL 25 MG PO CAPS
25 | Freq: Once | ORAL | Status: AC
Start: 2022-12-04 — End: 2022-12-04
  Administered 2022-12-04: 18:00:00 50 mg via ORAL

## 2022-12-04 MED ORDER — SODIUM CHLORIDE 0.9 % IV SOLN
0.9 % | INTRAVENOUS | Status: DC | PRN
Start: 2022-12-04 — End: 2022-12-05
  Administered 2022-12-04: 18:00:00 50 mL/h via INTRAVENOUS

## 2022-12-04 MED ORDER — IRON SUCROSE 20 MG/ML IV SOLN
20 | Freq: Once | INTRAVENOUS | Status: AC
Start: 2022-12-04 — End: 2022-12-04
  Administered 2022-12-04: 19:00:00 300 mg via INTRAVENOUS

## 2022-12-04 MED ORDER — ACETAMINOPHEN 325 MG PO TABS
325 | Freq: Once | ORAL | Status: AC
Start: 2022-12-04 — End: 2022-12-04
  Administered 2022-12-04: 18:00:00 650 mg via ORAL

## 2022-12-04 MED ORDER — NORMAL SALINE FLUSH 0.9 % IV SOLN
0.9 % | INTRAVENOUS | Status: DC | PRN
Start: 2022-12-04 — End: 2022-12-05

## 2022-12-04 MED FILL — ACETAMINOPHEN 325 MG PO TABS: 325 MG | ORAL | Qty: 2

## 2022-12-04 MED FILL — IRON SUCROSE 20 MG/ML IV SOLN: 20 MG/ML | INTRAVENOUS | Qty: 15

## 2022-12-04 MED FILL — DIPHENHYDRAMINE HCL 25 MG PO CAPS: 25 MG | ORAL | Qty: 2

## 2022-12-04 NOTE — Patient Instructions (Signed)
Thank you for attending your appointment today.  -- We did not make any medication changes today. Please continue medications as prescribed. -- Please come to clinic for your next shot on 4/25.  Please do not make any changes to medications without first discussing with your provider. If you are experiencing a psychiatric emergency, please call 911 or present to your nearest emergency department. Additional crisis, medication management, and therapy resources are included below.  [facility]  [address], [place], [place] [address] [phone] WALK-IN URGENT CARE 24/7 FOR ANYONE [address], [place], [place]  [phone] Fax: [phone] [email or web address] *Interpreters available *Accepts all insurance and uninsured for Urgent Care needs *Accepts Medicaid and uninsured for outpatient treatment (below)      ONLY FOR [facility]  Below:    Outpatient New Patient Assessment/Therapy Walk-ins:        Monday -Thursday 8am until slots are full.        Every Friday 1pm-4pm  (first come, first served)                   New Patient Psychiatry/Medication Management        Monday-Friday 8am-11am (first come, first served)               For all walk-ins we ask that you arrive by 7:15am, because patients will be seen in the order of arrival.

## 2022-12-04 NOTE — Progress Notes (Signed)
OPIC Short Note                       Date: December 04, 2022    Name: Anna Frazier    MRN: [id number]         DOB: 05-Jun-1996    Pt admit to [facility] for Dose 2 of 4 planned doses of Venofer ambulatory in stable condition. Assessment completed. No new concerns voiced. Pt states about 3-4 hours after her last infusion she experienced a "cold/burning" sensation "from the inside out." MD office notified via message and premedications for today and future infusions ordered. Pt aware to notify MD office if she has other symptoms and she verbalized understanding.     Anna Frazier vitals were reviewed prior to treatment.   Patient Vitals for the past 12 hrs:   Temp Pulse Resp BP   12/04/22 1622 -- 77 -- 105/65   12/04/22 1321 98.2 F (36.8 C) 74 18 124/87         PIV via RAC with positive blood return. flushed, and de-accessed per protocol post infusion.       Medications given:   Medications Administered         0.9 % sodium chloride infusion Admin Date  12/04/2022 Action  New Bag Dose  50 mL/hr Rate  50 mL/hr Route  IntraVENous Administered By  Nigel Sloop, RN        acetaminophen (TYLENOL) tablet 650 mg Admin Date  12/04/2022 Action  Given Dose  650 mg Rate   Route  Oral Administered By  Nigel Sloop, RN        diphenhydrAMINE (BENADRYL) capsule 50 mg Admin Date  12/04/2022 Action  Given Dose  50 mg Rate   Route  Oral Administered By  Nigel Sloop, RN        iron sucrose (VENOFER) 300 mg in sodium chloride 0.9 % 250 mL IVPB Admin Date  12/04/2022 Action  New Bag Dose  300 mg Rate  193.3 mL/hr Route  IntraVENous Administered By  Nigel Sloop, RN          Pt tolerated treatment well. D/c home ambulatory in no distress. Pt aware of next appointment scheduled.    Future Appointments   Date Time Provider Department Center   12/11/2022  1:00 PM G1 BRE FASTRACK [name]   12/18/2022  1:00 PM G1 BRE FASTRACK [facility]   02/08/2023 11:45 AM O'Laughlin, Liberty Handy, APRN - CNP [facility] AMB       Nigel Sloop, RN  December 04, 2022  5:31 PM

## 2022-12-05 ENCOUNTER — Telehealth: Payer: Self-pay

## 2022-12-05 ENCOUNTER — Telehealth (HOSPITAL_COMMUNITY): Payer: Self-pay | Admitting: *Deleted

## 2022-12-05 NOTE — Telephone Encounter (Signed)
[facility]- Discharge Call Backs-Spoke to pt about the following below. 1-Do you have any questions or concerns about yourself as you heal?No 2-Any concerns or questions about your baby?No 3-Reviewed ABC's of safe sleep. 4-How was your stay at the hospital?Great 5-How did our team work together to care for you?Yes You should be receiving a survey in the mail soon.   We would really appreciate it if you could fill that out for [place] and return it in the mail.  We value the feedback to make improvements and continue the great work we do.   If you have any questions please feel free to call me back at [phone]

## 2022-12-05 NOTE — Telephone Encounter (Signed)
Fax received for prior authorization of Abilify Maintena 400mg . Called [place] tracks spoke with Dasia who gave approval from 12/05/22-11/30/23 auth #[id number]. Called to notify pharmacy.

## 2022-12-06 MED FILL — VENOFER 20 MG/ML IV SOLN: 20 MG/ML | INTRAVENOUS | Qty: 20

## 2022-12-11 ENCOUNTER — Inpatient Hospital Stay: Admit: 2022-12-11 | Payer: PRIVATE HEALTH INSURANCE | Primary: Internal Medicine

## 2022-12-11 DIAGNOSIS — D508 Other iron deficiency anemias: Secondary | ICD-10-CM

## 2022-12-11 MED ORDER — ACETAMINOPHEN 325 MG PO TABS
325 | Freq: Once | ORAL | Status: AC
Start: 2022-12-11 — End: 2022-12-11
  Administered 2022-12-11: 17:00:00 650 mg via ORAL

## 2022-12-11 MED ORDER — DIPHENHYDRAMINE HCL 25 MG PO CAPS
25 | Freq: Once | ORAL | Status: AC
Start: 2022-12-11 — End: 2022-12-11
  Administered 2022-12-11: 17:00:00 50 mg via ORAL

## 2022-12-11 MED ORDER — SODIUM CHLORIDE 0.9 % IV SOLN
0.9 | INTRAVENOUS | Status: DC | PRN
Start: 2022-12-11 — End: 2022-12-12
  Administered 2022-12-11: 18:00:00 50 mL/h via INTRAVENOUS

## 2022-12-11 MED ORDER — NORMAL SALINE FLUSH 0.9 % IV SOLN
0.9 | INTRAVENOUS | Status: DC | PRN
Start: 2022-12-11 — End: 2022-12-12

## 2022-12-11 MED ORDER — IRON SUCROSE 20 MG/ML IV SOLN
20 MG/ML | Freq: Once | INTRAVENOUS | Status: AC
Start: 2022-12-11 — End: 2022-12-11
  Administered 2022-12-11: 18:00:00 400 mg via INTRAVENOUS

## 2022-12-11 MED FILL — ACETAMINOPHEN 325 MG PO TABS: 325 MG | ORAL | Qty: 2

## 2022-12-11 MED FILL — DIPHENHYDRAMINE HCL 25 MG PO CAPS: 25 MG | ORAL | Qty: 2

## 2022-12-11 MED FILL — IRON SUCROSE 20 MG/ML IV SOLN: 20 MG/ML | INTRAVENOUS | Qty: 20

## 2022-12-11 NOTE — Progress Notes (Signed)
OPIC Short Note                       Date: December 11, 2022    Name: Anna Frazier    MRN: [id number]         DOB: October 18, 1995      Pt admit to [name] for Venofer (3/4) ambulatory in stable condition. Assessment completed and documented in flowsheets. PIV placed to right AC.      Ms. Pitney vitals were reviewed prior to and after treatment.   Patient Vitals for the past 12 hrs:   Temp Pulse Resp BP   12/11/22 1630 -- 67 -- 124/66   12/11/22 1304 98.7 F (37.1 C) 86 18 132/78         Medications given: via PIV  Medications Administered         0.9 % sodium chloride infusion Admin Date  12/11/2022 Action  New Bag Dose  50 mL/hr Rate  50 mL/hr Route  IntraVENous Administered By  Hulda Humphrey, RN        acetaminophen (TYLENOL) tablet 650 mg Admin Date  12/11/2022 Action  Given Dose  650 mg Rate   Route  Oral Administered By  Barbara Cower, RN        diphenhydrAMINE (BENADRYL) capsule 50 mg Admin Date  12/11/2022 Action  Given Dose  50 mg Rate   Route  Oral Administered By  Barbara Cower, RN        iron sucrose (VENOFER) 400 mg in sodium chloride 0.9 % 250 mL IVPB Admin Date  12/11/2022 Action  New Bag Dose  400 mg Rate  118 mL/hr Route  IntraVENous Administered By  Hulda Humphrey, RN            PIV positive blood return noted, flushed and removed prior to discharge.    Ms. Hornbrook tolerated the infusion, and had no complaints. Patient declined to wait the full 30 minute post infusion observation period.    Ms. Klosowski was discharged from [facility]fusion [facility] in stable condition and is aware of future appointments.     Future Appointments   Date Time Provider Department Center   12/18/2022  1:00 PM BRE FASTTRACK 1 [facility]   02/08/2023 11:45 AM O'Laughlin, Liberty Handy, APRN - CNP [facility] BS AMB       Barbara Cower, RN  December 11, 2022  4:38 PM

## 2022-12-13 ENCOUNTER — Ambulatory Visit: Payer: Medicaid Other | Admitting: Obstetrics

## 2022-12-18 ENCOUNTER — Inpatient Hospital Stay: Admit: 2022-12-18 | Payer: PRIVATE HEALTH INSURANCE | Primary: Internal Medicine

## 2022-12-18 DIAGNOSIS — D508 Other iron deficiency anemias: Secondary | ICD-10-CM

## 2022-12-18 MED ORDER — DIPHENHYDRAMINE HCL 25 MG PO CAPS
25 | Freq: Once | ORAL | Status: AC
Start: 2022-12-18 — End: 2022-12-18
  Administered 2022-12-18: 17:00:00 50 mg via ORAL

## 2022-12-18 MED ORDER — ACETAMINOPHEN 325 MG PO TABS
325 | Freq: Once | ORAL | Status: AC
Start: 2022-12-18 — End: 2022-12-18
  Administered 2022-12-18: 17:00:00 650 mg via ORAL

## 2022-12-18 MED ORDER — SODIUM CHLORIDE 0.9 % IV SOLN
0.9 | Freq: Once | INTRAVENOUS | Status: AC
Start: 2022-12-18 — End: 2022-12-18
  Administered 2022-12-18: 18:00:00 400 mg via INTRAVENOUS

## 2022-12-18 MED ORDER — SODIUM CHLORIDE 0.9 % IV SOLN
0.9 % | INTRAVENOUS | Status: DC | PRN
Start: 2022-12-18 — End: 2022-12-19

## 2022-12-18 MED FILL — IRON SUCROSE 20 MG/ML IV SOLN: 20 MG/ML | INTRAVENOUS | Qty: 20

## 2022-12-18 MED FILL — ACETAMINOPHEN 325 MG PO TABS: 325 MG | ORAL | Qty: 2

## 2022-12-18 MED FILL — DIPHENHYDRAMINE HCL 25 MG PO CAPS: 25 MG | ORAL | Qty: 2

## 2022-12-18 NOTE — Progress Notes (Addendum)
OPIC Short Note                       Date: December 18, 2022    Name: Anna Frazier    MRN: [id number]         DOB: 04-Jun-1996      1300 Pt admit to [facility] for Venofer 5/5 ambulatory in stable condition. Assessment completed. No new concerns voiced. PIV established L AC pos blood return noted      Ms. Bowery's vitals were reviewed prior to and after treatment.   Patient Vitals for the past 12 hrs:   Temp Pulse Resp BP   12/18/22 1600 -- -- -- 110/70   12/18/22 1300 97.5 F (36.4 C) 77 18 102/66           Medications given:   Medications Administered         acetaminophen (TYLENOL) tablet 650 mg Admin Date  12/18/2022 Action  Given Dose  650 mg Rate   Route  Oral Administered By  Chancy Milroy, RN        diphenhydrAMINE (BENADRYL) capsule 50 mg Admin Date  12/18/2022 Action  Given Dose  50 mg Rate   Route  Oral Administered By  Chancy Milroy, RN        iron sucrose (VENOFER) 400 mg in sodium chloride 0.9 % 250 mL IVPB Admin Date  12/18/2022 Action  New Bag Dose  400 mg Rate  118 mL/hr Route  IntraVENous Administered By  Chancy Milroy, RN        PIV removed      Ms. Loge tolerated the infusion, and had no complaints.    Ms. Ruark was discharged from [facility]patient [facility]fusion [facility] in stable condition.     Future Appointments   Date Time Provider Department Center   02/08/2023 11:45 AM O'Laughlin, Liberty Handy, APRN - CNP [facility] AM[facility]       Chancy Milroy, RN  December 18, 2022  4:09 PM

## 2022-12-20 ENCOUNTER — Encounter (HOSPITAL_COMMUNITY): Payer: Self-pay

## 2022-12-20 ENCOUNTER — Ambulatory Visit (HOSPITAL_COMMUNITY): Payer: Medicaid Other

## 2022-12-20 VITALS — BP 122/71 | HR 71 | Ht 68.0 in | Wt 195.0 lb

## 2022-12-20 DIAGNOSIS — F411 Generalized anxiety disorder: Secondary | ICD-10-CM | POA: Diagnosis not present

## 2022-12-20 DIAGNOSIS — Z79899 Other long term (current) drug therapy: Secondary | ICD-10-CM

## 2022-12-20 DIAGNOSIS — G4700 Insomnia, unspecified: Secondary | ICD-10-CM

## 2022-12-20 DIAGNOSIS — F250 Schizoaffective disorder, bipolar type: Secondary | ICD-10-CM

## 2022-12-20 MED ORDER — ARIPIPRAZOLE ER 400 MG IM PRSY
400.0000 mg | PREFILLED_SYRINGE | Freq: Once | INTRAMUSCULAR | Status: AC
  Administered 2022-12-20: 400 mg via INTRAMUSCULAR

## 2022-12-20 NOTE — Progress Notes (Signed)
Patient arrived for injection of Abilify Maintena  given in RIGHT Deltoid. No side effects noted. No issues or complaints.

## 2022-12-29 NOTE — Progress Notes (Unsigned)
    Post Partum Visit Note  Melissa [name] is a 27 y.o. G61P2012 female who presents for a postpartum visit. She is {1-10:13787} {time; units:18646} postpartum following a {method of delivery:313099}.  I have fully reviewed the prenatal and intrapartum course. The delivery was at *** gestational weeks.  Anesthesia: {anesthesia types:812}. Postpartum course has been ***. Baby is doing well***. Baby is feeding by {breastmilk/bottle:69}. Bleeding {vag bleed:12292}. Bowel function is {normal:32111}. Bladder function is {normal:32111}. Patient {is/is not:9024} sexually active. Contraception method is {contraceptive method:5051}. Postpartum depression screening: {gen negative/positive:315881}.   The pregnancy intention screening data noted above was reviewed. Potential methods of contraception were discussed. The patient elected to proceed with No data recorded.    Health Maintenance Due  Topic Date Due   COVID-19 Vaccine (2 - Janssen risk series) 08/23/2020    {Common ambulatory SmartLinks:19316}  Review of Systems {ros; complete:30496}  Objective:  There were no vitals taken for this visit.   General:  {gen appearance:16600}   Breasts:  {desc; normal/abnormal/not indicated:14647}  Lungs: {lung exam:16931}  Heart:  {heart exam:5510}  Abdomen: {abdomen exam:16834}   Wound {Wound assessment:11097}  GU exam:  {desc; normal/abnormal/not indicated:14647}       Assessment:    1. Postpartum care and examination of lactating mother ***   *** postpartum exam.   Plan:   Essential components of care per ACOG recommendations:  1.  Mood and well being: Patient with {gen negative/positive:315881} depression screening today. Reviewed local resources for support.  - Patient tobacco use? {tobacco use:25506}  - hx of drug use? {yes/no:25505}    2. Infant care and feeding:  -Patient currently breastmilk feeding? {yes/no:25502}  -Social determinants of health (SDOH) reviewed in EPIC. No  concerns***The following needs were identified***  3. Sexuality, contraception and birth spacing - Patient {DOES_DOES ZOX:09604} want a pregnancy in the next year.  Desired family size is {NUMBER 1-10:22536} children.  - Reviewed reproductive life planning. Reviewed contraceptive methods based on pt preferences and effectiveness.  Patient desired {Upstream End Methods:24109} today.   - Discussed birth spacing of 18 months  4. Sleep and fatigue -Encouraged family/partner/community support of 4 hrs of uninterrupted sleep to help with mood and fatigue  5. Physical Recovery  - Discussed patients delivery and complications. She describes her labor as {description:25511} - Patient had a {CHL AMB DELIVERY:[phone]}. Patient had a {laceration:25518} laceration. Perineal healing reviewed. Patient expressed understanding - Patient has urinary incontinence? {yes/no:25515} - Patient {ACTION; IS/IS VWU:[id number]} safe to resume physical and sexual activity  6.  Health Maintenance - HM due items addressed {Yes or If no, why not?:20788} - Last pap smear  Diagnosis  Date Value Ref Range Status  06/27/2022   Final   - Negative for intraepithelial lesion or malignancy (NILM)   Pap smear {done:10129} at today's visit.  -Breast Cancer screening indicated? {indicated:25516}  7. Chronic Disease/Pregnancy Condition follow up: {Follow up:25499}  - PCP follow up  Glenetta Borg, CNM [facility]

## 2022-12-31 ENCOUNTER — Encounter: Payer: Self-pay | Admitting: Obstetrics

## 2022-12-31 ENCOUNTER — Ambulatory Visit (INDEPENDENT_AMBULATORY_CARE_PROVIDER_SITE_OTHER): Payer: Medicaid Other | Admitting: Obstetrics

## 2022-12-31 DIAGNOSIS — R32 Unspecified urinary incontinence: Secondary | ICD-10-CM

## 2022-12-31 DIAGNOSIS — Z3202 Encounter for pregnancy test, result negative: Secondary | ICD-10-CM

## 2022-12-31 LAB — POCT URINE PREGNANCY: Preg Test, Ur: NEGATIVE
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
## 2023-01-08 ENCOUNTER — Encounter (HOSPITAL_COMMUNITY): Payer: Medicaid Other | Admitting: Psychiatry

## 2023-01-08 ENCOUNTER — Ambulatory Visit (INDEPENDENT_AMBULATORY_CARE_PROVIDER_SITE_OTHER): Payer: Medicaid Other | Admitting: Psychiatry

## 2023-01-08 DIAGNOSIS — Z79899 Other long term (current) drug therapy: Secondary | ICD-10-CM

## 2023-01-08 DIAGNOSIS — F250 Schizoaffective disorder, bipolar type: Secondary | ICD-10-CM

## 2023-01-08 DIAGNOSIS — F419 Anxiety disorder, unspecified: Secondary | ICD-10-CM | POA: Diagnosis not present
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
## 2023-01-08 NOTE — Patient Instructions (Signed)
Thank you for attending your appointment today.  -- We did not make any medication changes today. Please continue medications as prescribed.  Please do not make any changes to medications without first discussing with your provider. If you are experiencing a psychiatric emergency, please call 911 or present to your nearest emergency department. Additional crisis, medication management, and therapy resources are included below.  [facility]  [address], [place], [place] [address] [phone] WALK-IN URGENT CARE 24/7 FOR ANYONE [address], [place], [place]  [phone] Fax: [phone] [email or web address] *Interpreters available *Accepts all insurance and uninsured for Urgent Care needs *Accepts Medicaid and uninsured for outpatient treatment (below)      ONLY FOR [place] Residents  Below:    Outpatient New Patient Assessment/Therapy Walk-ins:        Monday -Thursday 8am until slots are full.        Every Friday 1pm-4pm  (first come, first served)                   New Patient Psychiatry/Medication Management        Monday-Friday 8am-11am (first come, first served)               For all walk-ins we ask that you arrive by 7:15am, because patients will be seen in the order of arrival.   

## 2023-01-17 ENCOUNTER — Ambulatory Visit (HOSPITAL_COMMUNITY): Payer: Medicaid Other

## 2023-01-22 ENCOUNTER — Encounter (HOSPITAL_COMMUNITY): Payer: Self-pay

## 2023-01-22 ENCOUNTER — Ambulatory Visit (INDEPENDENT_AMBULATORY_CARE_PROVIDER_SITE_OTHER): Payer: Medicaid Other

## 2023-01-22 VITALS — BP 111/80 | HR 90 | Ht 67.0 in | Wt 198.0 lb

## 2023-01-22 DIAGNOSIS — F200 Paranoid schizophrenia: Secondary | ICD-10-CM | POA: Diagnosis not present

## 2023-01-22 DIAGNOSIS — F411 Generalized anxiety disorder: Secondary | ICD-10-CM

## 2023-01-22 DIAGNOSIS — G4700 Insomnia, unspecified: Secondary | ICD-10-CM

## 2023-01-22 MED ORDER — ARIPIPRAZOLE ER 400 MG IM PRSY
400.0000 mg | PREFILLED_SYRINGE | Freq: Once | INTRAMUSCULAR | Status: AC
  Administered 2023-01-22: 400 mg via INTRAMUSCULAR

## 2023-01-22 NOTE — Progress Notes (Signed)
Patient arrived for injection of Abilify Maintena 400mg  given in LEFT Deltoid. No side effects noted. No issues or complaints.

## 2023-01-23 ENCOUNTER — Telehealth (HOSPITAL_COMMUNITY): Payer: Self-pay

## 2023-01-23 NOTE — Telephone Encounter (Signed)
Advice - Met patient at the [facility] event 01/11/23 and she called me back to request consideration for a referral into IOP or PHP. Patient would not be able to go into IOP due to insurance status but could PHP if you feel needed. She has been coming in for injections but feels a program might could help her with some continued issues maintaining stability in her placement and with managing daily life issues.  Patient asked to let you know she would like to do this and she would like to speak to you about it.  She is scheduled next with you on 02/05/23. Patient denied any suicidal or homicidal ideations, no plans, intent or means at this time but would like to explore some increased services.

## 2023-01-27 ENCOUNTER — Inpatient Hospital Stay
Admit: 2023-01-27 | Discharge: 2023-01-27 | Disposition: A | Discharge status: Home / Self Care | Payer: PRIVATE HEALTH INSURANCE | Attending: Student in an Organized Health Care Education/Training Program

## 2023-01-27 ENCOUNTER — Emergency Department: Admit: 2023-01-27 | Payer: PRIVATE HEALTH INSURANCE | Primary: Internal Medicine

## 2023-01-27 DIAGNOSIS — J189 Pneumonia, unspecified organism: Secondary | ICD-10-CM

## 2023-01-27 MED ORDER — AMOXICILLIN-POT CLAVULANATE 875-125 MG PO TABS
875-125 MG | ORAL_TABLET | Freq: Two times a day (BID) | ORAL | 0 refills | Status: DC
Start: 2023-01-27 — End: 2023-01-27

## 2023-01-27 MED ORDER — BENZONATATE 100 MG PO CAPS
100 MG | ORAL_CAPSULE | Freq: Two times a day (BID) | ORAL | 0 refills | Status: AC | PRN
Start: 2023-01-27 — End: 2023-02-06

## 2023-01-27 MED ORDER — BENZONATATE 100 MG PO CAPS
100 MG | ORAL_CAPSULE | Freq: Two times a day (BID) | ORAL | 0 refills | Status: DC | PRN
Start: 2023-01-27 — End: 2023-01-27

## 2023-01-27 MED ORDER — AMOXICILLIN-POT CLAVULANATE 875-125 MG PO TABS
875-125 MG | ORAL_TABLET | Freq: Two times a day (BID) | ORAL | 0 refills | Status: AC
Start: 2023-01-27 — End: 2023-02-03

## 2023-01-27 NOTE — Discharge Instructions (Signed)
Discussed visit today.  X-ray shows right lower lobe pneumonia.  Please take the antibiotic for the full course.    Return to the emergency room with any worsening of symptoms.

## 2023-01-27 NOTE — ED Provider Notes (Signed)
[facility] EMERGENCY DEP  EMERGENCY DEPARTMENT ENCOUNTER      Pt Name: Anna Frazier  MRN: [id number]  Birthdate 1996/05/29  Date of evaluation: 01/27/2023  Provider: Gean Quint, PA-C    CHIEF COMPLAINT       Chief Complaint   Patient presents with   . Cough   . Congestion         HISTORY OF PRESENT ILLNESS    Patient is an 27 y.o. female with history of asthma (childhood - does not have inhaler right now), depression, and obesity who presents to the ER with reports of cough, chest congestion, headache over the past 3 weeks. Patient reports taking several OTC medications without relief. No known fever at home. Patient reports a mix of dry cough and productive cough. Patient denies chest pain, shortness of breath, abdominal pain, urinary symptoms, nausea or vomiting, diarrhea or constipation, dizziness, lightheadedness, fever or chills.  Patient reports alcohol use, everyday cigar smoking, former vaping, and admits to marijuana use.        Nursing Notes were reviewed.    REVIEW OF SYSTEMS       Review of Systems      PAST MEDICAL HISTORY     Past Medical History:   Diagnosis Date   . Asthma    . Asthma     Acute since childhood   . Contraception     Nexplanon left arm   . Depression    . Obesity          SURGICAL HISTORY     No past surgical history on file.      CURRENT MEDICATIONS       Previous Medications    ALBUTEROL SULFATE HFA (PROVENTIL;VENTOLIN;PROAIR) 108 (90 BASE) MCG/ACT INHALER    Inhale 2 puffs into the lungs every 6 hours as needed    CYANOCOBALAMIN 1000 MCG/ML INJECTION    Inject 1 mL into the muscle once for 1 dose To be done every 2 weeks for 4 injections, will pick up vials from pharmacy for in-clinic injection    FERROUS GLUCONATE (FERGON) 324 (38 FE) MG TABLET    Take 1 tablet by mouth with breakfast and with evening meal    METHOCARBAMOL (ROBAXIN) 500 MG TABLET    Take 1 tablet by mouth    POLYETHYLENE GLYCOL (GLYCOLAX) 17 GM/SCOOP POWDER    1 pack in 8 oz on water a day    SENNOSIDES-DOCUSATE SODIUM  (SENOKOT-S) 8.6-50 MG TABLET    Take 1 tablet by mouth daily    VITAMIN C (ASCORBIC ACID) 500 MG TABLET    Take 1 tablet by mouth daily       ALLERGIES     Patient has no known allergies.    FAMILY HISTORY       Family History   Problem Relation Age of Onset   . Cancer Maternal Grandmother 39        Breast and liver CA   . Diabetes Other    . Hypertension Other    . Diabetes Mother    . Stroke Paternal Grandmother    . Heart Disease Maternal Grandmother    . Heart Disease Mother         SVT, ablation   . High Cholesterol Mother    . Other Mother         Fibromyalgia   . Heart Disease Paternal Grandfather    . Hypertension Mother  SOCIAL HISTORY       Social History     Socioeconomic History   . Marital status: Single   Tobacco Use   . Smoking status: Every Day     Types: Cigars   . Smokeless tobacco: Former   . Tobacco comments:     Quit smoking: Black and milds 1-2 a day cigar   Substance and Sexual Activity   . Alcohol use: Yes     Alcohol/week: 1.0 standard drink of alcohol     Types: 1 Shots of liquor per week   . Drug use: Yes     Types: Marijuana Sheran Fava)   Social History Narrative    ** Merged History Encounter **          Social Determinants of Health     Financial Resource Strain: Low Risk  (10/04/2022)    Overall Financial Resource Strain (CARDIA)    . Difficulty of Paying Living Expenses: Not hard at all   Food Insecurity: No Food Insecurity (10/04/2022)    Hunger Vital Sign    . Worried About Programme researcher, broadcasting/film/video in the Last Year: Never true    . Ran Out of Food in the Last Year: Never true   Transportation Needs: Unknown (10/04/2022)    PRAPARE - Transportation    . Lack of Transportation (Non-Medical): No   Housing Stability: Unknown (10/04/2022)    Housing Stability Vital Sign    . Unstable Housing in the Last Year: No       PHYSICAL EXAM       Physical Exam  Vitals reviewed.   Constitutional:       General: She is not in acute distress.     Appearance: Normal appearance. She is not ill-appearing or  toxic-appearing.   HENT:      Head: Normocephalic and atraumatic.      Nose: Nose normal.      Mouth/Throat:      Mouth: Mucous membranes are moist.      Pharynx: Oropharynx is clear.   Eyes:      Extraocular Movements: Extraocular movements intact.      Conjunctiva/sclera: Conjunctivae normal.      Pupils: Pupils are equal, round, and reactive to light.   Cardiovascular:      Rate and Rhythm: Normal rate and regular rhythm.      Pulses: Normal pulses.      Heart sounds: Normal heart sounds.   Pulmonary:      Effort: Pulmonary effort is normal. No respiratory distress.      Breath sounds: Normal breath sounds. No wheezing or rhonchi.      Comments: Decreased lung sounds in right lower lobe.  Musculoskeletal:         General: Normal range of motion.      Cervical back: Normal range of motion and neck supple.   Skin:     General: Skin is warm.      Capillary Refill: Capillary refill takes less than 2 seconds.   Neurological:      General: No focal deficit present.      Mental Status: She is alert.   Psychiatric:         Mood and Affect: Mood normal.         Behavior: Behavior normal.         EMERGENCY DEPARTMENT COURSE and DIFFERENTIAL DIAGNOSIS/MDM:   Vitals:    Vitals:    01/27/23 1115   BP: (!) 147/97   Pulse:  81   Resp: 18   Temp: 97.7 F (36.5 C)   TempSrc: Temporal   SpO2: 99%   Weight: 68 kg (149 lb 14.6 oz)   Height: 1.524 m (5')       Medical Decision Making  Patient is an 27 y.o. female with history of asthma (childhood - does not have inhaler right now), depression, and obesity who presents to the ER with reports of cough, chest congestion, headache over the past 3 weeks. Ddx: Pneumonia, bronchitis, upper respiratory infection, and others.  Physical examination shows unremarkable cardiopulmonary examination.  Vitals are stable with elevated blood pressure reading. Chest XR shows mild airspace opacity in the right lower lobe suspicious for pneumonia.  Will start patient on Augmentin as well as Tessalon  Perles for the cough.  Patient is in no acute distress and okay for discharge.  Work note provided. Patient is agreeable to plan. All questions answered. Return precautions provided and discharged home at this time.       Problems Addressed:  Pneumonia of right lower lobe due to infectious organism: acute illness or injury    Amount and/or Complexity of Data Reviewed  Radiology: ordered. Decision-making details documented in ED Course.    Risk  Prescription drug management.      Procedures    FINAL IMPRESSION      1. Pneumonia of right lower lobe due to infectious organism          DISPOSITION/PLAN   DISPOSITION Decision To Discharge 01/27/2023 11:41:01 AM      PATIENT REFERRED TO:  [facility] EMERGENCY DEP  [address]  [place] [address]  [phone]  Go to   As needed, If symptoms worsen    Mingo Amber, MD  [name]  [address]  [place] [address]  [phone]    Schedule an appointment as soon as possible for a visit   As needed      DISCHARGE MEDICATIONS:  New Prescriptions    AMOXICILLIN-CLAVULANATE (AUGMENTIN) 875-125 MG PER TABLET    Take 1 tablet by mouth 2 times daily for 7 days    BENZONATATE (TESSALON) 100 MG CAPSULE    Take 1 capsule by mouth 2 times daily as needed for Cough     Controlled Substances Monitoring:          No data to display                (Please note that portions of this note were completed with a voice recognition program.  Efforts were made to edit the dictations but occasionally words are mis-transcribed.)    Gean Quint, PA-C (electronically signed)  Physician Assistant            Gean Quint, PA-C  01/27/23 1146

## 2023-01-27 NOTE — ED Triage Notes (Signed)
Pt c/o cough, congestion, headache and generalized body aches x 3 weeks.

## 2023-01-30 NOTE — Telephone Encounter (Signed)
Phoned pt to change 02/08/2023 appt from 11:30am to virtual at 8am. Pt agreed and expressed currently has pneumonia so she's apprehensive to go get labs completed. Advised pt to call [facility] to see their policy. Pt expressed understanding.

## 2023-02-02 ENCOUNTER — Encounter
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
## 2023-02-05 ENCOUNTER — Encounter (HOSPITAL_COMMUNITY): Payer: Self-pay | Admitting: Psychiatry

## 2023-02-05 ENCOUNTER — Ambulatory Visit (INDEPENDENT_AMBULATORY_CARE_PROVIDER_SITE_OTHER): Payer: Medicaid Other | Admitting: Psychiatry

## 2023-02-05 DIAGNOSIS — Z79899 Other long term (current) drug therapy: Secondary | ICD-10-CM | POA: Diagnosis not present

## 2023-02-05 DIAGNOSIS — F251 Schizoaffective disorder, depressive type: Secondary | ICD-10-CM

## 2023-02-05 MED ORDER — ARIPIPRAZOLE ER 400 MG IM PRSY: 400.0000 mg | PREFILLED_SYRINGE | INTRAMUSCULAR | 11 refills | Status: DC

## 2023-02-05 NOTE — Patient Instructions (Signed)
Thank you for attending your appointment today.  -- We did not make any medication changes today. Please continue medications as prescribed.  Please do not make any changes to medications without first discussing with your provider. If you are experiencing a psychiatric emergency, please call 911 or present to your nearest emergency department. Additional crisis, medication management, and therapy resources are included below.  [facility]  [address], [place], [place] [address] [phone] WALK-IN URGENT CARE 24/7 FOR ANYONE [address], , [place]  [phone] Fax: [phone] [email or web address] *Interpreters available *Accepts all insurance and uninsured for Urgent Care needs *Accepts Medicaid and uninsured for outpatient treatment (below)      ONLY FOR [place] Residents  Below:    Outpatient New Patient Assessment/Therapy Walk-ins:        Monday -Thursday 8am until slots are full.        Every Friday 1pm-4pm  (first come, first served)                   New Patient Psychiatry/Medication Management        Monday-Friday 8am-11am (first come, first served)               For all walk-ins we ask that you arrive by 7:15am, because patients will be seen in the order of arrival.   

## 2023-02-06 ENCOUNTER — Emergency Department: Payer: Medicaid Other

## 2023-02-06 ENCOUNTER — Emergency Department
Admission: EM | Admit: 2023-02-06 | Discharge: 2023-02-06 | Disposition: A | Discharge status: Home / Self Care | Payer: Medicaid Other | Attending: Emergency Medicine | Admitting: Emergency Medicine

## 2023-02-06 ENCOUNTER — Other Ambulatory Visit: Payer: Self-pay

## 2023-02-06 DIAGNOSIS — O26891 Other specified pregnancy related conditions, first trimester: Secondary | ICD-10-CM | POA: Diagnosis not present

## 2023-02-06 DIAGNOSIS — Z6720 Type B blood, Rh positive: Secondary | ICD-10-CM | POA: Diagnosis not present

## 2023-02-06 DIAGNOSIS — Z3491 Encounter for supervision of normal pregnancy, unspecified, first trimester: Secondary | ICD-10-CM

## 2023-02-06 DIAGNOSIS — Z3A01 Less than 8 weeks gestation of pregnancy: Secondary | ICD-10-CM | POA: Insufficient documentation

## 2023-02-06 DIAGNOSIS — R1030 Lower abdominal pain, unspecified: Secondary | ICD-10-CM | POA: Insufficient documentation

## 2023-02-06 LAB — CBC
HCT: 35.3 % — ABNORMAL LOW (ref 36.0–46.0)
Hemoglobin: 11.5 g/dL — ABNORMAL LOW (ref 12.0–15.0)
MCH: 28.1 pg (ref 26.0–34.0)
MCHC: 32.6 g/dL (ref 30.0–36.0)
MCV: 86.3 fL (ref 80.0–100.0)
Platelets: 310 10*3/uL (ref 150–400)
RBC: 4.09 MIL/uL (ref 3.87–5.11)
RDW: 13.9 % (ref 11.5–15.5)
WBC: 6.3 10*3/uL (ref 4.0–10.5)
nRBC: 0 % (ref 0.0–0.2)

## 2023-02-06 LAB — LIPASE, BLOOD: Lipase: 43 U/L (ref 11–51)

## 2023-02-06 LAB — URINALYSIS, ROUTINE W REFLEX MICROSCOPIC
Bilirubin Urine: NEGATIVE
Bilirubin Urine: NEGATIVE
Glucose, UA: NEGATIVE mg/dL
Glucose, UA: NEGATIVE mg/dL
Hgb urine dipstick: NEGATIVE
Hgb urine dipstick: NEGATIVE
Ketones, ur: NEGATIVE mg/dL
Ketones, ur: NEGATIVE mg/dL
Leukocytes,Ua: NEGATIVE
Nitrite: NEGATIVE
Nitrite: NEGATIVE
Protein, ur: NEGATIVE mg/dL
Protein, ur: NEGATIVE mg/dL
Specific Gravity, Urine: 1.023 (ref 1.005–1.030)
Specific Gravity, Urine: 1.03 (ref 1.005–1.030)
pH: 5 (ref 5.0–8.0)
pH: 6 (ref 5.0–8.0)

## 2023-02-06 LAB — COMPREHENSIVE METABOLIC PANEL
ALT: 17 U/L (ref 0–44)
AST: 18 U/L (ref 15–41)
Albumin: 3.8 g/dL (ref 3.5–5.0)
Alkaline Phosphatase: 67 U/L (ref 38–126)
Anion gap: 9 (ref 5–15)
BUN: 11 mg/dL (ref 6–20)
CO2: 23 mmol/L (ref 22–32)
Calcium: 9.1 mg/dL (ref 8.9–10.3)
Chloride: 106 mmol/L (ref 98–111)
Creatinine, Ser: 0.83 mg/dL (ref 0.44–1.00)
GFR, Estimated: >60 mL/min (ref >60)
Glucose, Bld: 86 mg/dL (ref 70–99)
Potassium: 3.8 mmol/L (ref 3.5–5.1)
Sodium: 138 mmol/L (ref 135–145)
Total Bilirubin: 0.7 mg/dL (ref 0.3–1.2)
Total Protein: 7.5 g/dL (ref 6.5–8.1)

## 2023-02-06 LAB — POC URINE PREG, ED
Preg Test, Ur: POSITIVE — AB
Preg Test, Ur: POSITIVE — AB

## 2023-02-06 LAB — HCG, QUANTITATIVE, PREGNANCY: hCG, Beta Chain, Quant, S: 1940 m[IU]/mL — ABNORMAL HIGH (ref <5)

## 2023-02-06 NOTE — ED Triage Notes (Signed)
Pt to ED via POV c/o abdominal cramping. Pt states that this has been going on for 3 days. Pt asking for a pregnancy test. Pt states that her last menstrual cycle was in April, states that her cycle was normal but it is not normal for her to skip cycles. Pt states that she did have some spotting 2 days ago. Pt is currently in NAD.

## 2023-02-06 NOTE — ED Notes (Signed)
Pt not in room at time of discharge.

## 2023-02-06 NOTE — Discharge Instructions (Signed)
Your beta hCG/pregnancy hormone level today is 1900.  Please have this rechecked by your OB/GYN or primary care doctor in 48 hours to ensure a normal first trimester pregnancy.  Return to the emergency department for any worsening abdominal pain cramping vaginal bleeding or any other symptom concerning to yourself.

## 2023-02-06 NOTE — ED Provider Notes (Signed)
[facility] Provider Note    Event Date/Time   First MD Initiated Contact with Patient 02/06/23 1223     (approximate)  History   Chief Complaint: Abdominal Pain  HPI  Melissa [name] is a 27 y.o. female with a past medical history of anxiety, schizophrenia, G3 P2 who presents to the emergency department with abdominal cramping and a positive pregnancy test.  According to the patient for the last 3 days she has been experiencing some intermittent lower abdominal cramping and noted a small amount of spotting yesterday although states has not noted any since.  Patient states she took a pregnancy test recently that was positive states her last menstrual period she believes was mid April.  Patient has not followed up with an OB during this pregnancy.  Denies any vaginal symptoms or urinary symptoms.  Physical Exam   Triage Vital Signs: ED Triage Vitals  Enc Vitals Group     BP 02/06/23 1052 98/68     Pulse Rate 02/06/23 1052 73     Resp 02/06/23 1052 16     Temp 02/06/23 1052 99.2 F (37.3 C)     Temp Source 02/06/23 1052 Oral     SpO2 02/06/23 1052 98 %     Weight 02/06/23 1053 199 lb (90.3 kg)     Height 02/06/23 1053 5\' 7"  (1.702 m)     Head Circumference --      Peak Flow --      Pain Score 02/06/23 1052 7     Pain Loc --      Pain Edu? --      Excl. in GC? --     Most recent vital signs: Vitals:   02/06/23 1052  BP: 98/68  Pulse: 73  Resp: 16  Temp: 99.2 F (37.3 C)  SpO2: 98%    General: Awake, no distress.  CV:  Good peripheral perfusion.  Regular rate and rhythm  Resp:  Normal effort.  Equal breath sounds bilaterally.  Abd:  No distention.  Soft, nontender.  No rebound or guarding.  ED Results / Procedures / Treatments   RADIOLOGY  Ultrasound shows single IUP/sac measuring 5 weeks   MEDICATIONS ORDERED IN ED: Medications - No data to display   IMPRESSION / MDM / ASSESSMENT AND PLAN / ED COURSE  I reviewed the triage  vital signs and the nursing notes.  Patient's presentation is most consistent with acute presentation with potential threat to life or bodily function.  Patient presents emergency department for lower abdominal cramping and a positive pregnancy test.  Overall the patient appears well, no distress.  Benign abdominal exam.  Patient's workup today shows a positive pregnancy test normal lipase normal chemistry including LFTs normal CBC reassuring urinalysis.  Will obtain a pelvic ultrasound to further evaluate.  Given the small amount of spotting yesterday I did review the patient's historical lab work she has a B+ blood type and does not require RhoGAM.  Patient's labs have resulted with a beta-hCG of 1900, reassuring CBC chemistry lipase normal urinalysis.  Patient's ultrasound shows a intrauterine gestational sac measuring 5 weeks 0 days.  Will have the patient follow-up with her doctor in 48 hours for recheck of her beta hCG to confirm pregnancy.  FINAL CLINICAL IMPRESSION(S) / ED DIAGNOSES   Lower abdominal cramping First trimester pregnancy  Rx / DC Orders   Ultrasound shows single intrauterine gestational sac measuring 5 weeks  Note:  This document was prepared using Nurse, children's  voice recognition software and may include unintentional dictation errors.   Minna Antis, MD 02/06/23 1501

## 2023-02-08 ENCOUNTER — Ambulatory Visit: Payer: PRIVATE HEALTH INSURANCE | Primary: Internal Medicine

## 2023-02-11 ENCOUNTER — Other Ambulatory Visit: Payer: Self-pay

## 2023-02-11 ENCOUNTER — Other Ambulatory Visit: Payer: Medicaid Other

## 2023-02-11 DIAGNOSIS — O209 Hemorrhage in early pregnancy, unspecified: Secondary | ICD-10-CM

## 2023-02-11 NOTE — Telephone Encounter (Signed)
Phoned pt to r/s appt scheduled 02/14/23 to 02/25/2023 at 2pm; pt stated she was unable to complete labs due to balance on acct. Advised to call to have labs switched if any other issues. Pt expressed understanding.

## 2023-02-12 LAB — BETA HCG QUANT (REF LAB): hCG Quant: 9421 m[IU]/mL

## 2023-02-14 ENCOUNTER — Ambulatory Visit: Payer: PRIVATE HEALTH INSURANCE | Primary: Internal Medicine

## 2023-02-14 ENCOUNTER — Ambulatory Visit: Payer: Medicaid Other

## 2023-02-14 ENCOUNTER — Telehealth: Payer: Self-pay | Admitting: Obstetrics and Gynecology

## 2023-02-14 DIAGNOSIS — D509 Iron deficiency anemia, unspecified: Secondary | ICD-10-CM

## 2023-02-14 NOTE — Telephone Encounter (Signed)
Pt was scheduled for NOB Intake appt on 02/14/2023 at 2:15.  She has rescheduled for 02/15/2023 at 1:15.

## 2023-02-14 NOTE — Progress Notes (Unsigned)
[facility]  [address], [address] [place] [address]  W: [phone]  F: [phone]    Reason for Visit:   Anna Frazier is a 27 y.o. female with B12 deficiency who is seen for evaluation of iron deficiency anemia.     History of Present Illness:   Anna Frazier is a pleasant 27 y.o. female who presents today for evaluation of IDA.    Patient seen by PCP for routine follow up. Labs from 10/04/22 notable for hemoglobin 9.4, MCV 73, platelets 547, ferritin 17, iron sat 3%. She was advised to start oral iron BID. She took it daily but has not been able to tolerate oral iron due to constipation.   She was hospitalized [facility] for intractable vomiting. FOBT positive. Hemoglobin at that time was 6.9. She required 1 unit PRBCs. GI work up with EGD and colonoscopy negative during hospitalization.  She has never received an iron infusion.    She was also noted to have low B12 and initiated on B12 injections and oral B12 supplementation.     Menstrual history: regular cycles, menses last 4 days with 1 heavy day, requires pad change 3-4x per day, no clotting. Not on birth control.     She does not eat beef, pork, dairy. She will eat chicken, seafood, and vegetables.    Endorses severe pica.    Interval History:  Presents today for follow up and lab review.   SP venofer 300 mg x2, 400 mg x2 (4/2, 4/9, 4/16, 12/18/22).   Tolerated the infusions ***.   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 Lives with her sister. Current smoker - black and milds x 10 years. Drinks 12 shots 1-2x per week.     Review of systems was obtained and pertinent findings reviewed above. Past medical history, social history, family history, medications, and allergies are located in the electronic medical record.    Physical Exam:   There were no vitals taken for this visit.    General: no  distress  Eyes: anicteric sclerae  HENT: oropharynx clear  Neck: supple  Respiratory: normal respiratory effort  CV: no peripheral edema  Ext: warm, well perfused  GI: soft, nontender, nondistended, no masses  Skin: no rashes, no ecchymoses, no petechiae    Results:     Lab Results   Component Value Date/Time    WBC 9.7 11/02/2022 03:47 PM    HGB 9.8 11/02/2022 03:47 PM    HCT 33.8 11/02/2022 03:47 PM    PLT 490 11/02/2022 03:47 PM    MCV 75 11/02/2022 03:47 PM     Lab Results   Component Value Date/Time    NA 140 10/07/2022 05:50 PM    K 3.6 10/07/2022 05:50 PM    CL 106 10/07/2022 05:50 PM    CO2 26 10/07/2022 05:50 PM    BUN 5 10/07/2022 05:50 PM    GFRAA 111 09/28/2020 09:28 AM     Lab Results   Component Value Date/Time    ALT 16 10/07/2022 05:50 PM    GLOB 4.2 10/07/2022 05:50 PM     Records from [facility] reviewed and summarized above.  Test results above have been reviewed.    Assessment:   #) Iron deficiency anemia, severe:  Hospitalized for severe  anemia at [facility] and received 1 unit PRBCs. EGD/colonoscopy there unremarkable. Unable to tolerate oral iron due to constipation. Etiology likely due to chronic blood loss from menses (although no menorrhagia) and inadequate oral iron intake.   Status post venofer 300 mg x2, 400 mg x2 (4/2, 4/9, 4/16, 12/18/22).   Repeat labs show ***     #) B12 deficiency:  B12 supplementation per PCP    Plan:     CBC with diff, ferritin, iron profile now  Screen for celiac disease  Venofer weekly x 4 doses  Return to clinic in 3 months with repeat labs prior to visit      ***

## 2023-02-15 ENCOUNTER — Telehealth: Payer: Self-pay | Admitting: Obstetrics and Gynecology

## 2023-02-15 ENCOUNTER — Other Ambulatory Visit: Payer: Self-pay

## 2023-02-15 ENCOUNTER — Ambulatory Visit (INDEPENDENT_AMBULATORY_CARE_PROVIDER_SITE_OTHER): Payer: Medicaid Other

## 2023-02-15 VITALS — Wt 199.0 lb

## 2023-02-15 DIAGNOSIS — Z3480 Encounter for supervision of other normal pregnancy, unspecified trimester: Secondary | ICD-10-CM

## 2023-02-15 DIAGNOSIS — Z3689 Encounter for other specified antenatal screening: Secondary | ICD-10-CM

## 2023-02-15 DIAGNOSIS — Z369 Encounter for antenatal screening, unspecified: Secondary | ICD-10-CM

## 2023-02-15 NOTE — Progress Notes (Signed)
The patient is scheduled for 6/27 at [facility] at 10:15 am. The patient is aware of location,date, time with having a full bladder.
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
## 2023-02-15 NOTE — Patient Instructions (Signed)
First Trimester of Pregnancy  The first trimester of pregnancy starts on the first day of your last menstrual period until the end of week 12. This is also called months 1 through 3 of pregnancy. Body changes during your first trimester Your body goes through many changes during pregnancy. The changes usually return to normal after your baby is born. Physical changes You may gain or lose weight. Your breasts may grow larger and hurt. The area around your nipples may get darker. Dark spots or blotches may develop on your face. You may have changes in your hair. Health changes You may feel like you might vomit (nauseous), and you may vomit. You may have heartburn. You may have headaches. You may have trouble pooping (constipation). Your gums may bleed. Other changes You may get tired easily. You may pee (urinate) more often. Your menstrual periods will stop. You may not feel hungry. You may want to eat certain kinds of food. You may have changes in your emotions from day to day. You may have more dreams. Follow these instructions at home: Medicines Take over-the-counter and prescription medicines only as told by your doctor. Some medicines are not safe during pregnancy. Take a prenatal vitamin that contains at least 600 micrograms (mcg) of folic acid. Eating and drinking Eat healthy meals that include: Fresh fruits and vegetables. Whole grains. Good sources of protein, such as meat, eggs, or tofu. Low-fat dairy products. Avoid raw meat and unpasteurized juice, milk, and cheese. If you feel like you may vomit, or you vomit: Eat 4 or 5 small meals a day instead of 3 large meals. Try eating a few soda crackers. Drink liquids between meals instead of during meals. You may need to take these actions to prevent or treat trouble pooping: Drink enough fluids to keep your pee (urine) pale yellow. Eat foods that are high in fiber. These include beans, whole grains, and fresh fruits and  vegetables. Limit foods that are high in fat and sugar. These include fried or sweet foods. Activity Exercise only as told by your doctor. Most people can do their usual exercise routine during pregnancy. Stop exercising if you have cramps or pain in your lower belly (abdomen) or low back. Do not exercise if it is too hot or too humid, or if you are in a place of great height (high altitude). Avoid heavy lifting. If you choose to, you may have sex unless your doctor tells you not to. Relieving pain and discomfort Wear a good support bra if your breasts are sore. Rest with your legs raised (elevated) if you have leg cramps or low back pain. If you have bulging veins (varicose veins) in your legs: Wear support hose as told by your doctor. Raise your feet for 15 minutes, 3-4 times a day. Limit salt in your food. Safety Wear your seat belt at all times when you are in a car. Talk with your doctor if someone is hurting you or yelling at you. Talk with your doctor if you are feeling sad or have thoughts of hurting yourself. Lifestyle Do not use hot tubs, steam rooms, or saunas. Do not douche. Do not use tampons or scented sanitary pads. Do not use herbal medicines, illegal drugs, or medicines that are not approved by your doctor. Do not drink alcohol. Do not smoke or use any products that contain nicotine or tobacco. If you need help quitting, ask your doctor. Avoid cat litter boxes and soil that is used by cats. These carry   germs that can cause harm to the baby and can cause a loss of your baby by miscarriage or stillbirth. General instructions Keep all follow-up visits. This is important. Ask for help if you need counseling or if you need help with nutrition. Your doctor can give you advice or tell you where to go for help. Visit your dentist. At home, brush your teeth with a soft toothbrush. Floss gently. Write down your questions. Take them to your prenatal visits. Where to find more  information American Pregnancy Association: americanpregnancy.org American College of Obstetricians and Gynecologists: www.acog.org Office on Women's Health: womenshealth.gov/pregnancy Contact a doctor if: You are dizzy. You have a fever. You have mild cramps or pressure in your lower belly. You have a nagging pain in your belly area. You continue to feel like you may vomit, you vomit, or you have watery poop (diarrhea) for 24 hours or longer. You have a bad-smelling fluid coming from your vagina. You have pain when you pee. You are exposed to a disease that spreads from person to person, such as chickenpox, measles, Zika virus, HIV, or hepatitis. Get help right away if: You have spotting or bleeding from your vagina. You have very bad belly cramping or pain. You have shortness of breath or chest pain. You have any kind of injury, such as from a fall or a car crash. You have new or increased pain, swelling, or redness in an arm or leg. Summary The first trimester of pregnancy starts on the first day of your last menstrual period until the end of week 12 (months 1 through 3). Eat 4 or 5 small meals a day instead of 3 large meals. Do not smoke or use any products that contain nicotine or tobacco. If you need help quitting, ask your doctor. Keep all follow-up visits. This information is not intended to replace advice given to you by your health care provider. Make sure you discuss any questions you have with your health care provider. Document Revised: 01/20/2020 Document Reviewed: 11/26/2019 Elsevier Patient Education  2024 Elsevier Inc. Commonly Asked Questions During Pregnancy  Cats: A parasite can be excreted in cat feces.  To avoid exposure you need to have another person empty the little box.  If you must empty the litter box you will need to wear gloves.  Wash your hands after handling your cat.  This parasite can also be found in raw or undercooked meat so this should also be  avoided.  Colds, Sore Throats, Flu: Please check your medication sheet to see what you can take for symptoms.  If your symptoms are unrelieved by these medications please call the office.  Dental Work: Most any dental work your dentist recommends is permitted.  X-rays should only be taken during the first trimester if absolutely necessary.  Your abdomen should be shielded with a lead apron during all x-rays.  Please notify your provider prior to receiving any x-rays.  Novocaine is fine; gas is not recommended.  If your dentist requires a note from us prior to dental work please call the office and we will provide one for you.  Exercise: Exercise is an important part of staying healthy during your pregnancy.  You may continue most exercises you were accustomed to prior to pregnancy.  Later in your pregnancy you will most likely notice you have difficulty with activities requiring balance like riding a bicycle.  It is important that you listen to your body and avoid activities that put you at a higher   risk of falling.  Adequate rest and staying well hydrated are a must!  If you have questions about the safety of specific activities ask your provider.    Exposure to Children with illness: Try to avoid obvious exposure; report any symptoms to us when noted,  If you have chicken pos, red measles or mumps, you should be immune to these diseases.   Please do not take any vaccines while pregnant unless you have checked with your OB provider.  Fetal Movement: After 28 weeks we recommend you do "kick counts" twice daily.  Lie or sit down in a calm quiet environment and count your baby movements "kicks".  You should feel your baby at least 10 times per hour.  If you have not felt 10 kicks within the first hour get up, walk around and have something sweet to eat or drink then repeat for an additional hour.  If count remains less than 10 per hour notify your provider.  Fumigating: Follow your pest control agent's  advice as to how long to stay out of your home.  Ventilate the area well before re-entering.  Hemorrhoids:   Most over-the-counter preparations can be used during pregnancy.  Check your medication to see what is safe to use.  It is important to use a stool softener or fiber in your diet and to drink lots of liquids.  If hemorrhoids seem to be getting worse please call the office.   Hot Tubs:  Hot tubs Jacuzzis and saunas are not recommended while pregnant.  These increase your internal body temperature and should be avoided.  Intercourse:  Sexual intercourse is safe during pregnancy as long as you are comfortable, unless otherwise advised by your provider.  Spotting may occur after intercourse; report any bright red bleeding that is heavier than spotting.  Labor:  If you know that you are in labor, please go to the hospital.  If you are unsure, please call the office and let us help you decide what to do.  Lifting, straining, etc:  If your job requires heavy lifting or straining please check with your provider for any limitations.  Generally, you should not lift items heavier than that you can lift simply with your hands and arms (no back muscles)  Painting:  Paint fumes do not harm your pregnancy, but may make you ill and should be avoided if possible.  Latex or water based paints have less odor than oils.  Use adequate ventilation while painting.  Permanents & Hair Color:  Chemicals in hair dyes are not recommended as they cause increase hair dryness which can increase hair loss during pregnancy.  " Highlighting" and permanents are allowed.  Dye may be absorbed differently and permanents may not hold as well during pregnancy.  Sunbathing:  Use a sunscreen, as skin burns easily during pregnancy.  Drink plenty of fluids; avoid over heating.  Tanning Beds:  Because their possible side effects are still unknown, tanning beds are not recommended.  Ultrasound Scans:  Routine ultrasounds are performed  at approximately 20 weeks.  You will be able to see your baby's general anatomy an if you would like to know the gender this can usually be determined as well.  If it is questionable when you conceived you may also receive an ultrasound early in your pregnancy for dating purposes.  Otherwise ultrasound exams are not routinely performed unless there is a medical necessity.  Although you can request a scan we ask that you pay for it when   conducted because insurance does not cover " patient request" scans.  Work: If your pregnancy proceeds without complications you may work until your due date, unless your physician or employer advises otherwise.  Round Ligament Pain/Pelvic Discomfort:  Sharp, shooting pains not associated with bleeding are fairly common, usually occurring in the second trimester of pregnancy.  They tend to be worse when standing up or when you remain standing for long periods of time.  These are the result of pressure of certain pelvic ligaments called "round ligaments".  Rest, Tylenol and heat seem to be the most effective relief.  As the womb and fetus grow, they rise out of the pelvis and the discomfort improves.  Please notify the office if your pain seems different than that described.  It may represent a more serious condition.  Common Medications Safe in Pregnancy  Acne:      Constipation:  Benzoyl Peroxide     Colace  Clindamycin      Dulcolax Suppository  Topica Erythromycin     Fibercon  Salicylic Acid      Metamucil         Miralax AVOID:        Senakot   Accutane    Cough:  Retin-A       Cough Drops  Tetracycline      Phenergan w/ Codeine if Rx  Minocycline      Robitussin (Plain & DM)  Antibiotics:     Crabs/Lice:  Ceclor       RID  Cephalosporins    AVOID:  E-Mycins      Kwell  Keflex  Macrobid/Macrodantin   Diarrhea:  Penicillin      Kao-Pectate  Zithromax      Imodium AD         PUSH FLUIDS AVOID:       Cipro     Fever:  Tetracycline      Tylenol (Regular  or Extra  Minocycline       Strength)  Levaquin      Extra Strength-Do not          Exceed 8 tabs/24 hrs Caffeine:        <200mg/day (equiv. To 1 cup of coffee or  approx. 3 12 oz sodas)         Gas: Cold/Hayfever:       Gas-X  Benadryl      Mylicon  Claritin       Phazyme  **Claritin-D        Chlor-Trimeton    Headaches:  Dimetapp      ASA-Free Excedrin  Drixoral-Non-Drowsy     Cold Compress  Mucinex (Guaifenasin)     Tylenol (Regular or Extra  Sudafed/Sudafed-12 Hour     Strength)  **Sudafed PE Pseudoephedrine   Tylenol Cold & Sinus     Vicks Vapor Rub  Zyrtec  **AVOID if Problems With Blood Pressure         Heartburn: Avoid lying down for at least 1 hour after meals  Aciphex      Maalox     Rash:  Milk of Magnesia     Benadryl    Mylanta       1% Hydrocortisone Cream  Pepcid  Pepcid Complete   Sleep Aids:  Prevacid      Ambien   Prilosec       Benadryl  Rolaids       Chamomile Tea  Tums (Limit 4/day)     Unisom           Tylenol PM         Warm milk-add vanilla or  Hemorrhoids:       Sugar for taste  Anusol/Anusol H.C.  (RX: Analapram 2.5%)  Sugar Substitutes:  Hydrocortisone OTC     Ok in moderation  Preparation H      Tucks        Vaseline lotion applied to tissue with wiping    Herpes:     Throat:  Acyclovir      Oragel  Famvir  Valtrex     Vaccines:         Flu Shot Leg Cramps:       *Gardasil  Benadryl      Hepatitis A         Hepatitis B Nasal Spray:       Pneumovax  Saline Nasal Spray     Polio Booster         Tetanus Nausea:       Tuberculosis test or PPD  Vitamin B6 25 mg TID   AVOID:    Dramamine      *Gardasil  Emetrol       Live Poliovirus  Ginger Root 250 mg QID    MMR (measles, mumps &  High Complex Carbs @ Bedtime    rebella)  Sea Bands-Accupressure    Varicella (Chickenpox)  Unisom 1/2 tab TID     *No known complications           If received before Pain:         Known pregnancy;   Darvocet       Resume series  after  Lortab        Delivery  Percocet    Yeast:   Tramadol      Femstat  Tylenol 3      Gyne-lotrimin  Ultram       Monistat  Vicodin           MISC:         All Sunscreens           Hair Coloring/highlights          Insect Repellant's          (Including DEET)         Mystic Tans  

## 2023-02-15 NOTE — Telephone Encounter (Signed)
Pt had a [facility] Nurse Intake appt on 02/14/2023 at 2:15.  Pt called in and rescheduled the appt to 02/15/2023 at 1:15.

## 2023-02-15 NOTE — Telephone Encounter (Signed)
Verified patient ID x 2    Called patient to discuss getting labs done prior to July 1st OV. Patient stated she cannot get labs done at this time. She would prefer to cancel 7/1 OV and will call back to reschedule in the future once she is able to get her labs done. No further needs.

## 2023-02-19 ENCOUNTER — Encounter (HOSPITAL_COMMUNITY): Payer: Self-pay

## 2023-02-19 ENCOUNTER — Ambulatory Visit (INDEPENDENT_AMBULATORY_CARE_PROVIDER_SITE_OTHER): Payer: Medicaid Other

## 2023-02-19 ENCOUNTER — Ambulatory Visit (HOSPITAL_COMMUNITY): Payer: Medicaid Other

## 2023-02-19 VITALS — BP 115/72 | HR 66 | Ht 67.0 in | Wt 202.2 lb

## 2023-02-19 DIAGNOSIS — F251 Schizoaffective disorder, depressive type: Secondary | ICD-10-CM | POA: Diagnosis not present

## 2023-02-19 MED ORDER — ARIPIPRAZOLE ER 400 MG IM PRSY
400.0000 mg | PREFILLED_SYRINGE | Freq: Once | INTRAMUSCULAR | Status: AC
Start: 2023-02-19 — End: 2023-02-19
  Administered 2023-02-19: 400 mg via INTRAMUSCULAR

## 2023-02-19 NOTE — Progress Notes (Cosign Needed)
Patient arrived for injection of Abilify Maintena 400mg given in RIGHT Deltoid. No side effects noted. No issues or complaints. 

## 2023-02-21 ENCOUNTER — Ambulatory Visit
Admission: RE | Admit: 2023-02-21 | Discharge: 2023-02-21 | Disposition: A | Discharge status: Home / Self Care | Payer: Medicaid Other | Source: Ambulatory Visit | Attending: Obstetrics | Admitting: Obstetrics

## 2023-02-21 DIAGNOSIS — Z3480 Encounter for supervision of other normal pregnancy, unspecified trimester: Secondary | ICD-10-CM | POA: Insufficient documentation

## 2023-02-21 DIAGNOSIS — Z3A01 Less than 8 weeks gestation of pregnancy: Secondary | ICD-10-CM | POA: Insufficient documentation

## 2023-02-21 DIAGNOSIS — Z369 Encounter for antenatal screening, unspecified: Secondary | ICD-10-CM | POA: Insufficient documentation

## 2023-02-21 DIAGNOSIS — Z3687 Encounter for antenatal screening for uncertain dates: Secondary | ICD-10-CM | POA: Insufficient documentation

## 2023-02-25 ENCOUNTER — Ambulatory Visit: Payer: PRIVATE HEALTH INSURANCE | Primary: Internal Medicine

## 2023-02-27 ENCOUNTER — Other Ambulatory Visit (HOSPITAL_COMMUNITY)
Admission: RE | Admit: 2023-02-27 | Discharge: 2023-02-27 | Disposition: A | Discharge status: Home / Self Care | Payer: MEDICAID | Source: Ambulatory Visit | Attending: Licensed Practical Nurse | Admitting: Licensed Practical Nurse

## 2023-02-27 ENCOUNTER — Ambulatory Visit: Payer: MEDICAID

## 2023-02-27 VITALS — Ht 67.0 in | Wt 200.0 lb

## 2023-02-27 DIAGNOSIS — Z113 Encounter for screening for infections with a predominantly sexual mode of transmission: Secondary | ICD-10-CM | POA: Insufficient documentation

## 2023-02-27 DIAGNOSIS — N898 Other specified noninflammatory disorders of vagina: Secondary | ICD-10-CM

## 2023-02-27 NOTE — Patient Instructions (Signed)
Chlamydia, Female  Chlamydia is a sexually transmitted infection (STI). This infection spreads through sexual contact. Chlamydia can occur in different areas of the body, including: The urethra. This is the part of the body that drains urine from the bladder. The cervix. This is the lowest part of the uterus. The throat. The rectum. This condition is not difficult to treat. However, if left untreated, chlamydia can lead to more serious health problems, including pelvic inflammatory disease (PID). PID can increase your risk of being unable to have children. In pregnant women, untreated chlamydia can cause serious complications during pregnancy or health problems for the baby after delivery. What are the causes? This condition is caused by a bacteria called Chlamydia trachomatis. The bacteria are spread from an infected partner during sexual activity. Chlamydia can spread through contact with the genitals, mouth, or rectum. What increases the risk? The following factors may make you more likely to develop this condition: Not using a condom the right way or not using a condom every time you have sex. Having a new sex partner or having more than one sex partner. Being sexually active before age 25. What are the signs or symptoms? In some cases, there are no symptoms, especially early in the infection. If symptoms develop, they may include: Urinating often, or a burning feeling during urination. Redness, soreness, or swelling of the vagina or rectum. Discharge coming from the vagina or rectum. Pain in the abdomen. Pain during sex. Bleeding between menstrual periods or irregular periods. How is this diagnosed? This condition may be diagnosed with: Urine tests. Swab tests. Depending on your symptoms, your health care provider may use a cotton swab to collect a fluid sample from your vagina, rectum, nose, or throat to test for the bacteria. A pelvic exam. How is this treated? This condition is  treated with antibiotic medicines. Follow these instructions at home: Sexual activity Tell your sex partner or partners about your infection. These include any partners for oral, anal, or vaginal sex that you have had within 60 days of when your symptoms started. Sex partners should also be treated, even if they have no signs of the infection. Do not have sex until you and your sex partners have completed treatment and your health care provider says it is okay. If your health care provider prescribed you a single-dose medicine as treatment, wait at least 7 days after taking the medicine before having sex. General instructions Take over-the-counter and prescription medicines as told by your health care provider. Finish all antibiotic medicine even when you start to feel better. It is up to you to get your test results. Ask your health care provider, or the department that is doing the test, when your results will be ready. Keep all follow-up visits. This is important. You may need to be tested for infection again 3 months after treatment. How is this prevented? You can lower your risk of getting chlamydia by: Using latex or polyurethane condoms correctly every time you have sex. Not having multiple sex partners. Asking if your sex partner has been tested for STIs and had negative results. Getting regular health screenings to check for STIs. Contact a health care provider if: You develop new symptoms or your symptoms are getting worse. Your symptoms do not get better after treatment. You have a fever or chills. You have pain during sex. You have irregular menstrual periods, or you have bleeding between periods or after sex. You develop flu-like symptoms, such as night sweats, sore throat,   or muscle aches. You are unable to take your antibiotic medicine as prescribed. Summary Chlamydia is a sexually transmitted infection (STI) that is caused by bacteria. This infection spreads through sexual  contact. This condition is treated with antibiotic medicines. If left untreated, chlamydia can lead to more serious health problems, including pelvic inflammatory disease (PID). Your sex partners will also need to be treated. Do not have sex until both you and your partner have been treated. Take medicines as directed by your health care provider and keep all follow-up visits to ensure your infection has been completely treated. This information is not intended to replace advice given to you by your health care provider. Make sure you discuss any questions you have with your health care provider. Document Revised: 06/05/2021 Document Reviewed: 06/05/2021 Elsevier Patient Education  2024 Elsevier Inc.  

## 2023-02-27 NOTE — Progress Notes (Signed)
    NURSE VISIT NOTE  Subjective:    Patient ID: Melissa [name], female    DOB: 12-Nov-1995, 27 y.o.   MRN: [id number]  HPI  Patient is a 27 y.o. [id number] female who presents for a self swab nurse visit. Has been having vaginal odor for the last week. Denies excessive/unusual discharge, vaginal itching and irritation. Denies abnormal vaginal bleeding or significant pelvic pain or fever.  Denies   UTI symptoms . Patient does not has history of known exposure to STD.   Objective:    Ht 5\' 7"  (1.702 m)   Wt 200 lb (90.7 kg)   LMP  (LMP Unknown)   BMI 31.32 kg/m      Assessment:   1. Screening for STD (sexually transmitted disease)   2. Vaginal odor      Plan:   Aptima swab sent to lab. Treatment: Will wait for results to be treated if needed. ROV prn if symptoms persist or worsen.   Donnetta Hail, CMA

## 2023-03-01 LAB — CERVICOVAGINAL ANCILLARY ONLY
Bacterial Vaginitis (gardnerella): POSITIVE — AB
Candida Glabrata: NEGATIVE
Candida Vaginitis: POSITIVE — AB
Chlamydia: POSITIVE — AB
Comment: NEGATIVE
Comment: NEGATIVE
Comment: NEGATIVE
Comment: NEGATIVE
Comment: NEGATIVE
Comment: NORMAL
Neisseria Gonorrhea: NEGATIVE
Trichomonas: NEGATIVE

## 2023-03-03 ENCOUNTER — Other Ambulatory Visit: Payer: Self-pay | Admitting: Obstetrics

## 2023-03-03 ENCOUNTER — Encounter: Payer: Self-pay | Admitting: Obstetrics

## 2023-03-03 MED ORDER — AZITHROMYCIN 500 MG PO TABS: 1000.0000 mg | ORAL_TABLET | Freq: Once | ORAL | 0 refills | Status: AC

## 2023-03-03 MED ORDER — ONDANSETRON HCL 4 MG PO TABS: 4.0000 mg | ORAL_TABLET | Freq: Three times a day (TID) | ORAL | 0 refills | Status: DC | PRN

## 2023-03-03 MED ORDER — MONISTAT 7 COMPLETE THERAPY 100-2 MG-% VA KIT: 1.0000 | PACK | Freq: Every day | VAGINAL | 0 refills | Status: AC

## 2023-03-03 MED ORDER — METRONIDAZOLE 500 MG PO TABS
500.0000 mg | ORAL_TABLET | Freq: Two times a day (BID) | ORAL | 0 refills | Status: DC
Start: 2023-03-03 — End: 2023-07-19

## 2023-03-19 ENCOUNTER — Ambulatory Visit (INDEPENDENT_AMBULATORY_CARE_PROVIDER_SITE_OTHER): Payer: MEDICAID

## 2023-03-19 ENCOUNTER — Encounter (HOSPITAL_COMMUNITY): Payer: Self-pay

## 2023-03-19 VITALS — BP 109/76 | HR 104 | Ht 67.0 in | Wt 200.8 lb

## 2023-03-19 DIAGNOSIS — F200 Paranoid schizophrenia: Secondary | ICD-10-CM

## 2023-03-19 DIAGNOSIS — G4700 Insomnia, unspecified: Secondary | ICD-10-CM

## 2023-03-19 DIAGNOSIS — F411 Generalized anxiety disorder: Secondary | ICD-10-CM | POA: Diagnosis not present

## 2023-03-19 MED ORDER — ARIPIPRAZOLE ER 400 MG IM PRSY
400.0000 mg | PREFILLED_SYRINGE | Freq: Once | INTRAMUSCULAR | Status: AC
  Administered 2023-03-19: 400 mg via INTRAMUSCULAR

## 2023-03-19 NOTE — Progress Notes (Cosign Needed)
Patient arrived for injection of Abilify Maintena 400mg  given in LEFT Deltoid. No side effects noted. No issues or complaints.

## 2023-03-28 IMAGING — US US MFM OB FOLLOW-UP
1 series · 13 of 28 positions shown · non-contrast
Comparison: none

[Series 1: us mfm ob follow-up · 54 acquisitions, 13 frames shown]
[im 2/54]
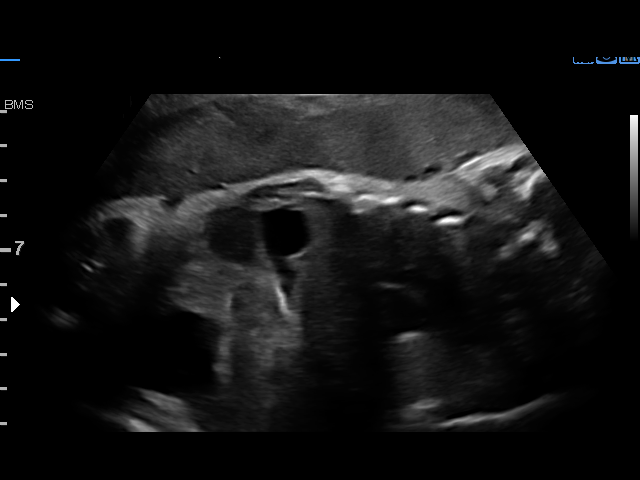
[im 6/54]
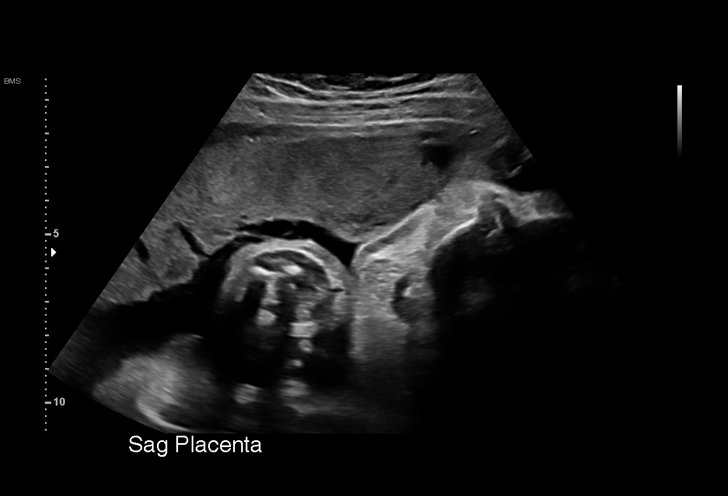
[im 10/54]
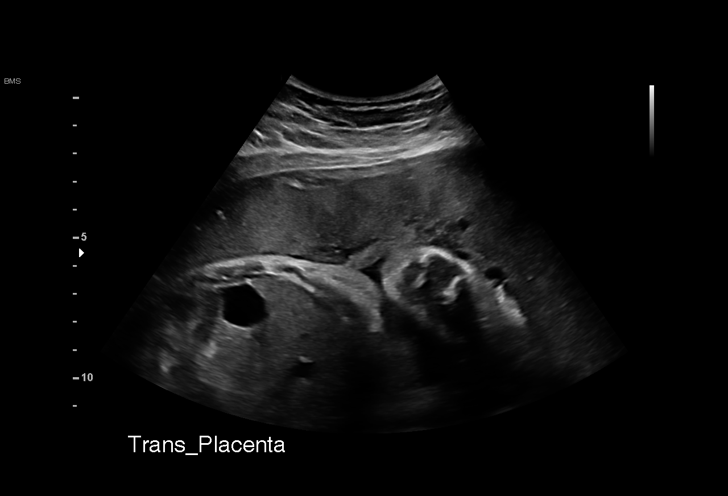
[im 14/54]
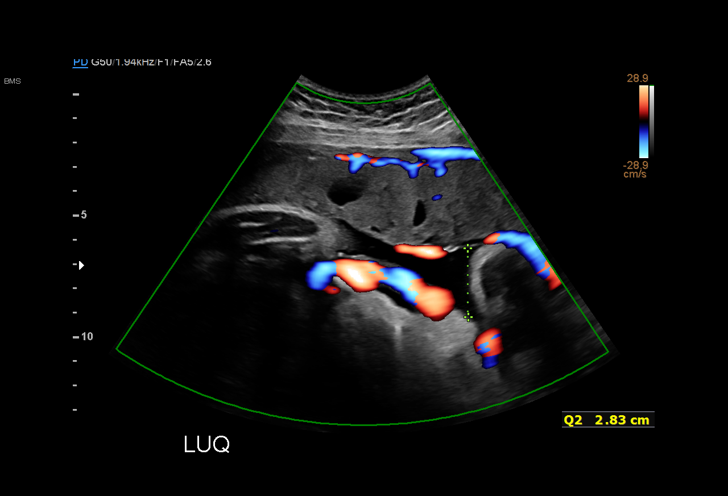
[im 18/54]
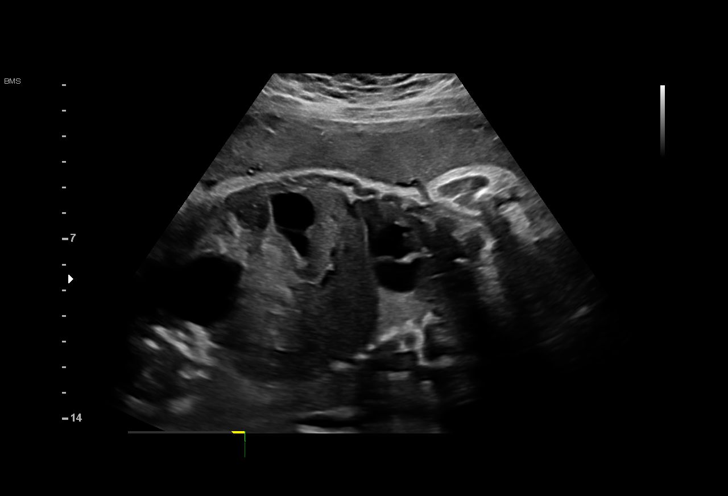
[im 22/54]
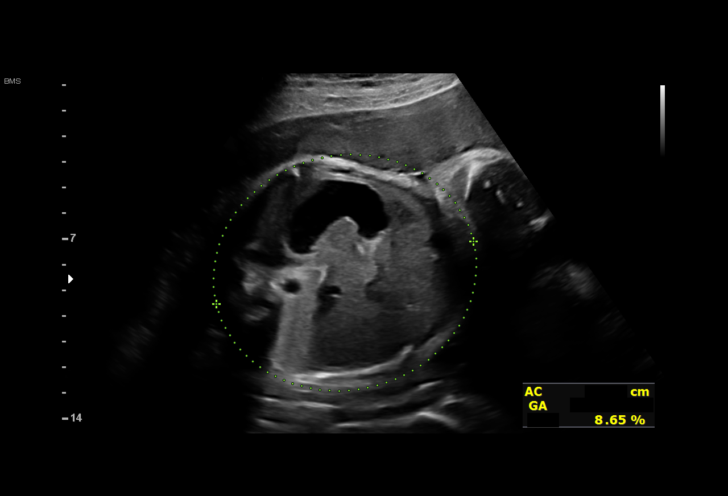
[im 28/54]
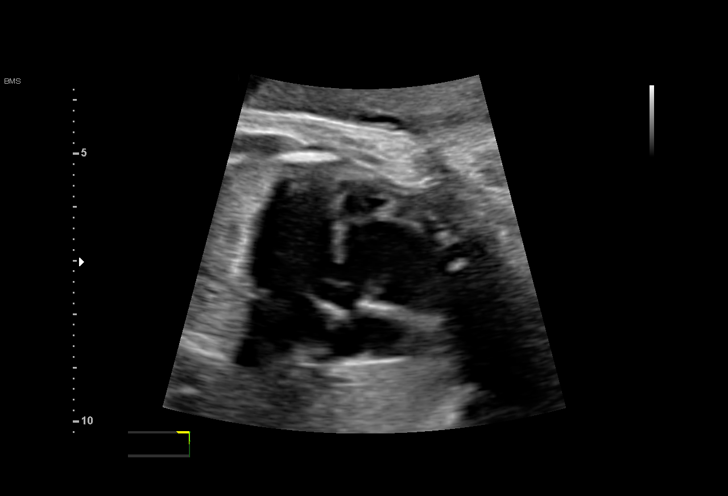
[im 32/54]
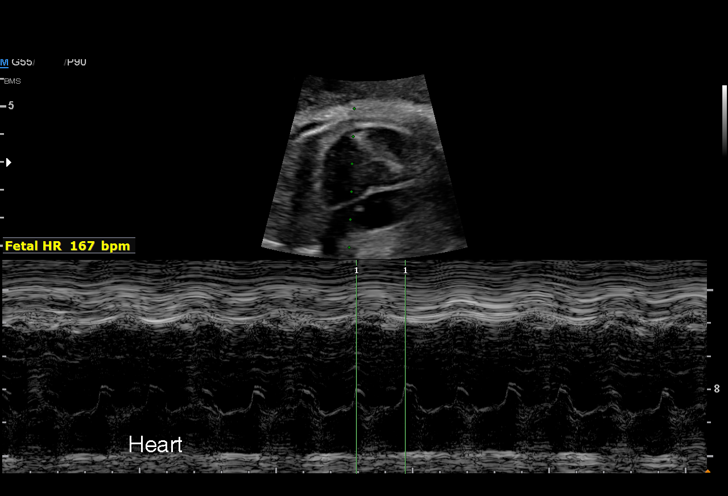
[im 36/54]
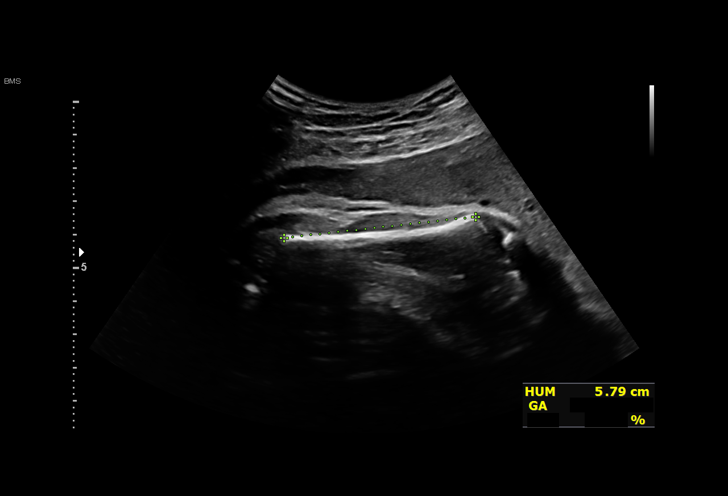
[im 40/54]
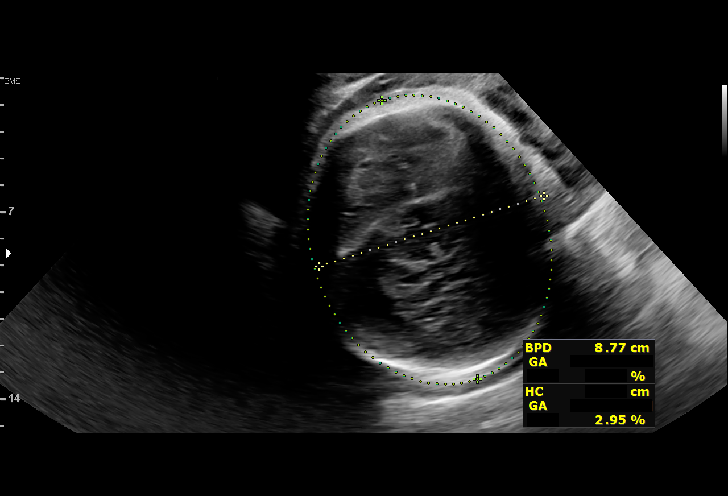
[im 44/54]
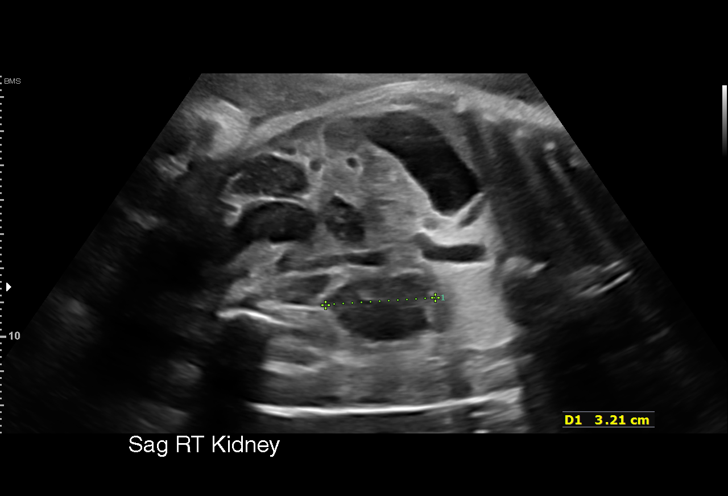
[im 48/54]
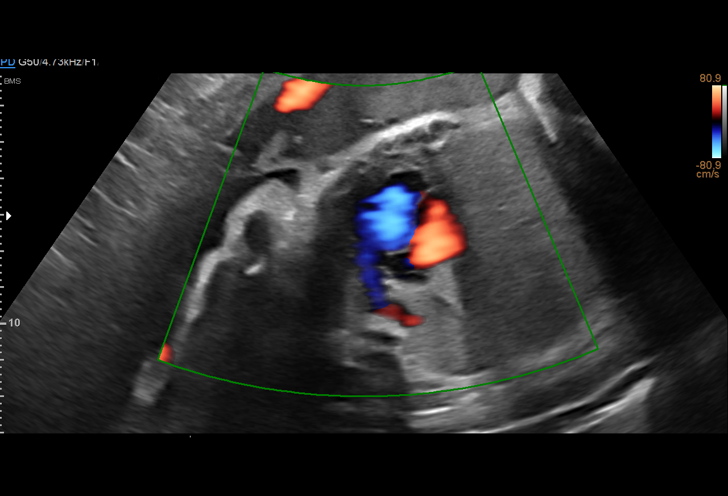
[im 52/54]
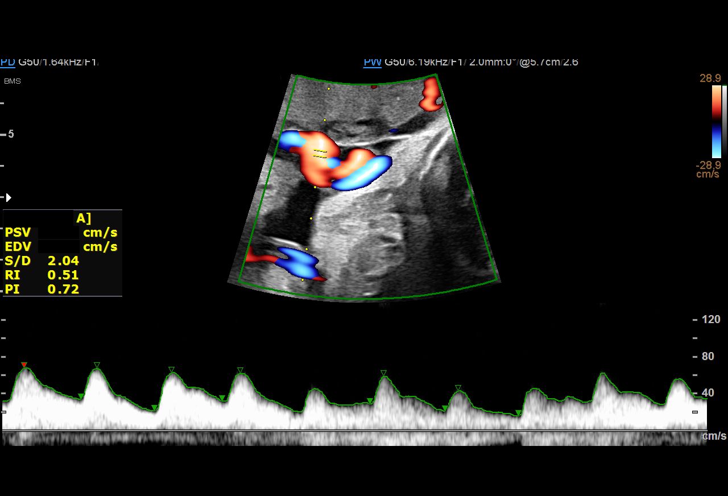

[13 of 28 positions shown; findings below may reference images not displayed]

[REDACTED] Health-                          [HOSPITAL] at

 3  US MFM UA CORD DOPPLER                76820.02    [facility]

Indications

 Uterine size-date discrepancy, third trimester
 Encounter for other antenatal screening
 follow-up
 36 weeks gestation of pregnancy
 Family history of VSD (Brother & Father)
Fetal Evaluation

 Num Of Fetuses:         1
 Fetal Heart Rate(bpm):  167
 Cardiac Activity:       Observed
 Presentation:           Cephalic
 Placenta:               Anterior
 P. Cord Insertion:      Visualized, central

 Amniotic Fluid
 AFI FV:      Within normal limits

 AFI Sum(cm)     %Tile       Largest Pocket(cm)
 9.15            17

 RUQ(cm)       RLQ(cm)       LUQ(cm)        LLQ(cm)
 0
Biophysical Evaluation

 Amniotic F.V:   Within normal limits       F. Tone:        Observed
 F. Movement:    Observed                   Score:          [DATE]
 F. Breathing:   Observed
Biometry

 BPD:      87.9  mm     G. Age:  35w 4d         29  %    CI:        79.18   %    70 - 86
                                                         FL/HC:      21.5   %    20.8 -
 HC:      312.3  mm     G. Age:  35w 0d        2.5  %    HC/AC:      1.04        0.92 -
 AC:      299.4  mm     G. Age:  33w 6d        3.5  %    FL/BPD:     76.6   %    71 - 87
 FL:       67.3  mm     G. Age:  34w 4d          7  %    FL/AC:      22.5   %    20 - 24
 HUM:        58  mm     G. Age:  33w 4d         11  %

 Est. FW:    4202  gm      5 lb 5 oz      7  %
OB History

 Gravidity:    2
 Living:       0
Gestational Age

 LMP:           36w 5d        Date:  07/27/20                 EDD:   05/03/21
 U/S Today:     34w 5d                                        EDD:   05/17/21
 Best:          36w 5d     Det. By:  LMP  (07/27/20)          EDD:   05/03/21
Anatomy

 Cranium:               Appears normal         Aortic Arch:            Previously seen
 Cavum:                 Previously seen        Ductal Arch:            Previously seen
 Ventricles:            Previously seen        Diaphragm:              Appears normal
 Choroid Plexus:        Previously seen        Stomach:                Appears normal, left
                                                                       sided
 Cerebellum:            Previously seen        Abdomen:                Appears normal
 Posterior Fossa:       Previously seen        Abdominal Wall:         Previously seen
 Nuchal Fold:           Previously seen        Cord Vessels:           Previously seen
 Face:                  Orbits and profile     Kidneys:                Appear normal
                        previously seen
 Lips:                  Previously seen        Bladder:                Appears normal
 Thoracic:              Previously seen        Spine:                  Previously seen
 Heart:                 Appears normal         Upper Extremities:      Previously seen
                        (4CH, axis, and
                        situs)
 RVOT:                  Appears normal         Lower Extremities:      Previously seen
 LVOT:                  Appears normal

 Other:  Fetus appears to be female. Technically difficult due to fetal position.
Doppler - Fetal Vessels

 Umbilical Artery
  S/D     %tile      RI    %tile      PI    %tile            ADFV    RDFV
  2.21       39    0.55       44    0.77       42               No      No

Cervix Uterus Adnexa
 Cervix
 Not visualized (advanced GA >17wks)
Impression

 Single intrauterine pregnancy here for a follow up growth due
 to late prenatal care.
 Normal anatomy with measurements less than dates due to
 FGR EFW 7th%.
 There is good fetal movement and amniotic fluid volume
 The UA Dopplers are normal without evidence of AEDF or
 REDF
 Biophysical profile [DATE]

 I discussed today's visit with a diagnosis of IUGR. I explained
 that the etiology includes placental insufficiency, chronic
 disease, infection, aneuploidy and other genetic syndromes.
 She has a low risk NIPS. She has no additional risk factors
 for chronic disease. At this time I explained the diagnosis,
 evaluation and management to include on going fetal growth
 and weekly antenatal testing to include UA Dopplers. If the
 EFW < 3rd% or abnormal testing, I recommend delivery at 37
 weeks otherwise if all is normal consider delivery at 39
 weeks.
Recommendations

 Continue weekly testing with UA Dopplers
 Consider delivery at 39 weeks.
 You may consider a growth at 3 weeks if desired

## 2023-03-29 ENCOUNTER — Telehealth: Payer: Self-pay | Admitting: Licensed Practical Nurse

## 2023-03-29 NOTE — Telephone Encounter (Signed)
Reached out to pt to reschedule NOB appt that was scheduled on 04/01/23 bc provider not in office.  Left message for pt to call back.  Needs rescheduling.

## 2023-04-01 ENCOUNTER — Encounter: Payer: Medicaid Other | Admitting: Licensed Practical Nurse

## 2023-04-01 NOTE — Telephone Encounter (Signed)
Patient is rescheduled to 04/08/23 with [facility]

## 2023-04-04 IMAGING — US US MFM UA CORD DOPPLER
1 series · 13 of 28 positions shown · non-contrast
Comparison: none

[Series 1: us mfm ua cord doppler · 58 acquisitions, 13 frames shown]
[im 3/58]
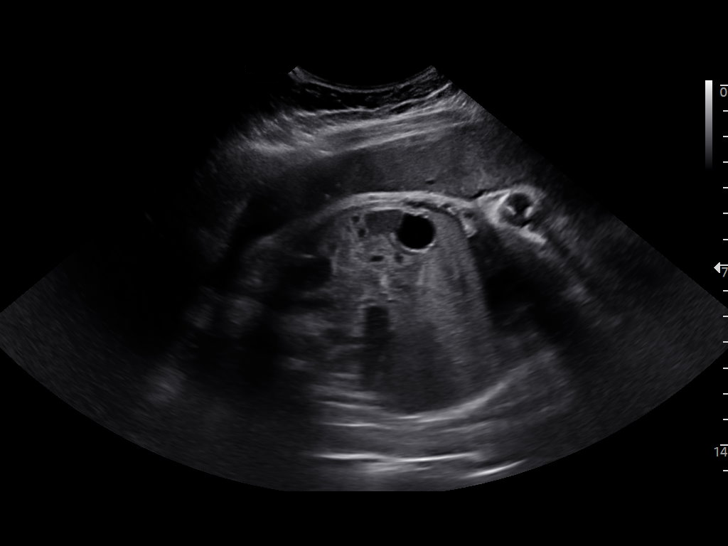
[im 7/58]
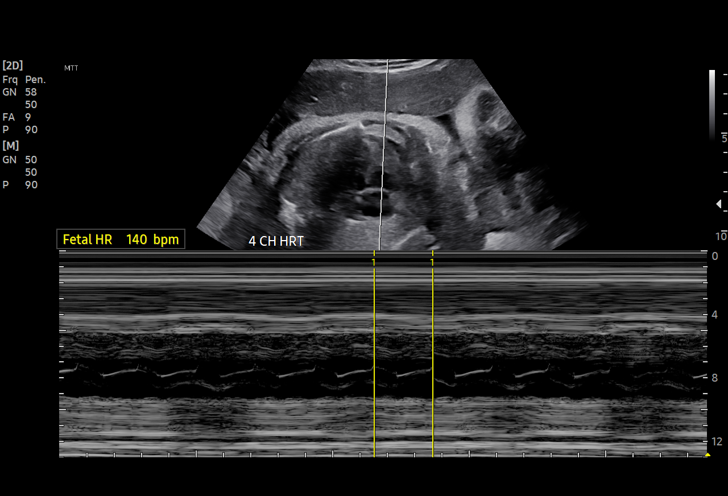
[im 11/58]
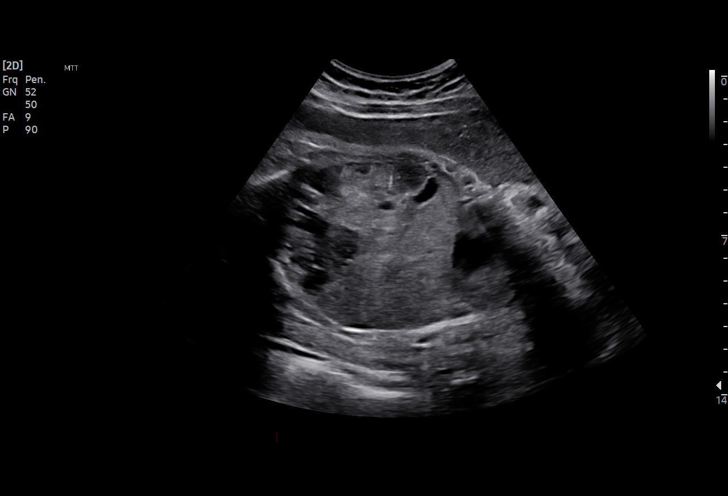
[im 15/58]
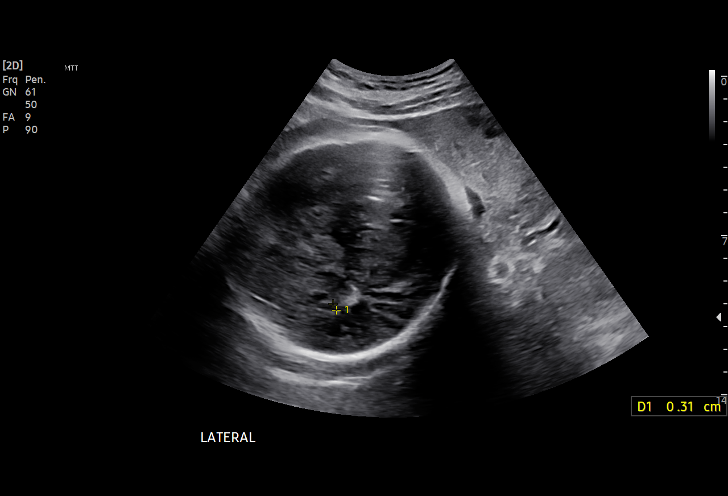
[im 20/58]
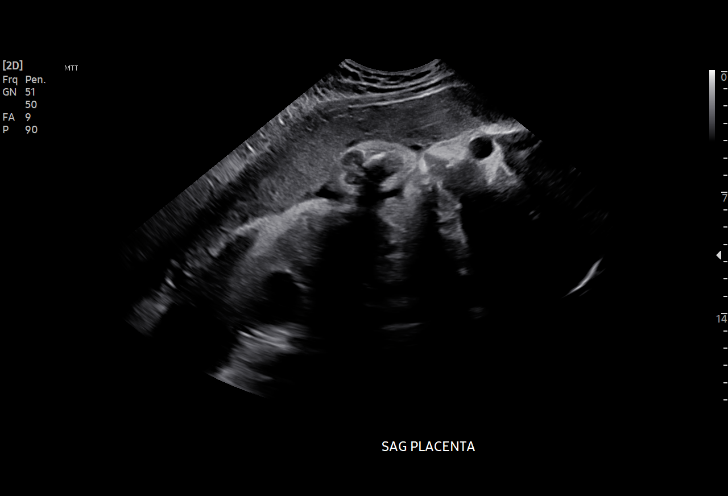
[im 24/58]
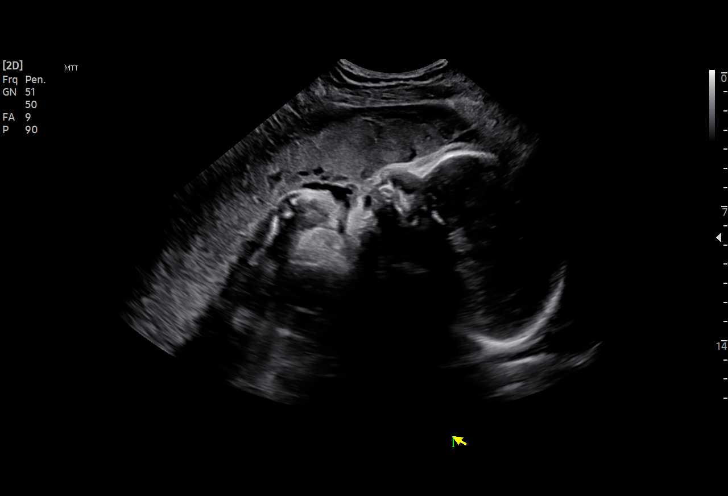
[im 30/58]
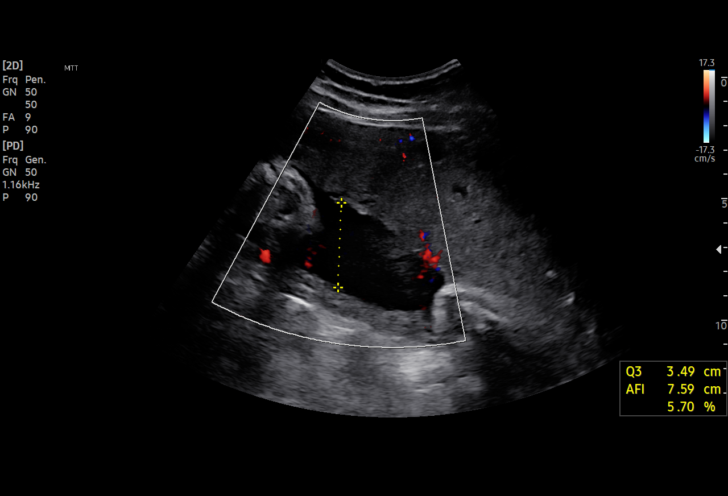
[im 34/58]
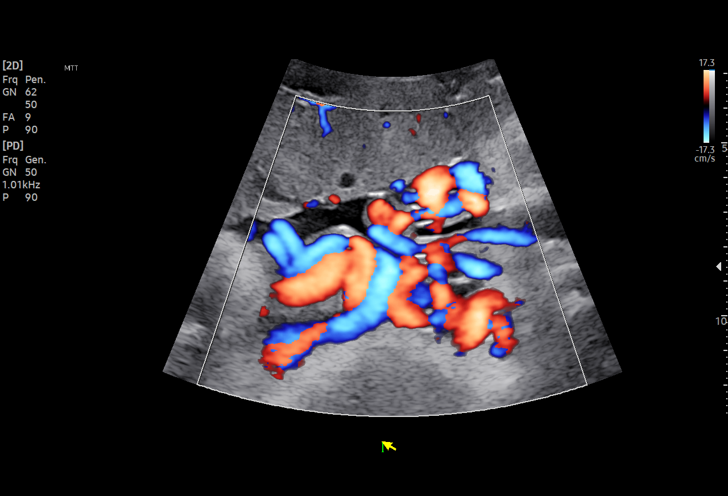
[im 39/58]
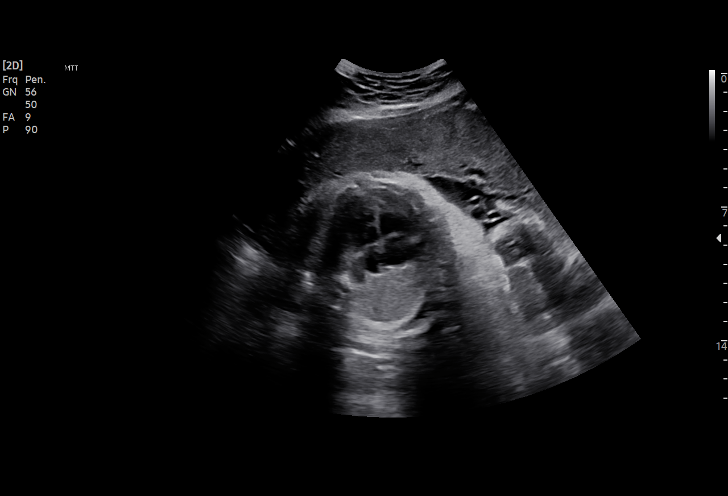
[im 43/58]
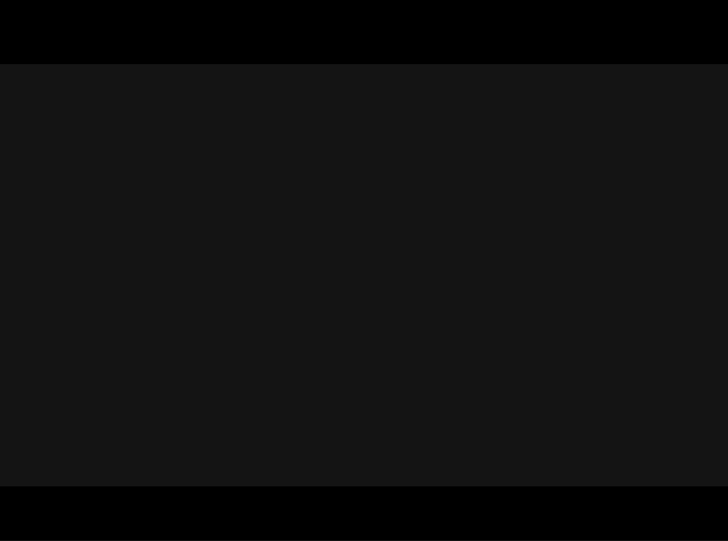
[im 47/58]
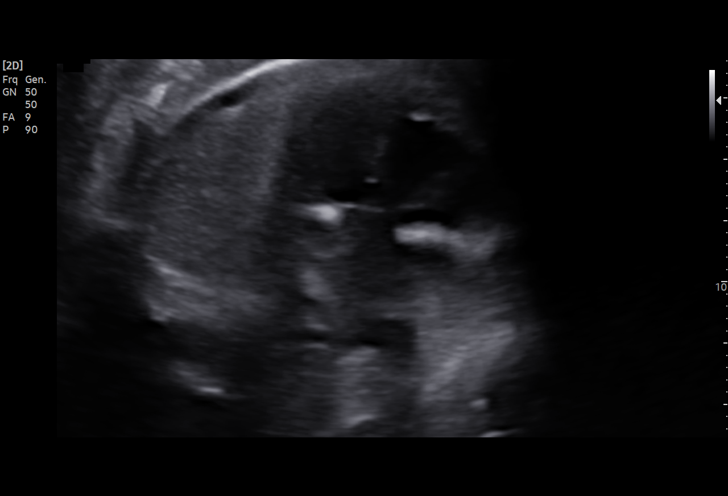
[im 51/58]
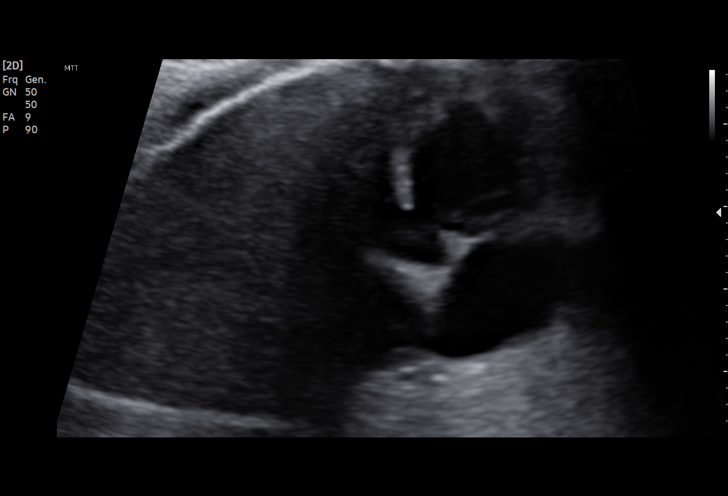
[im 55/58]
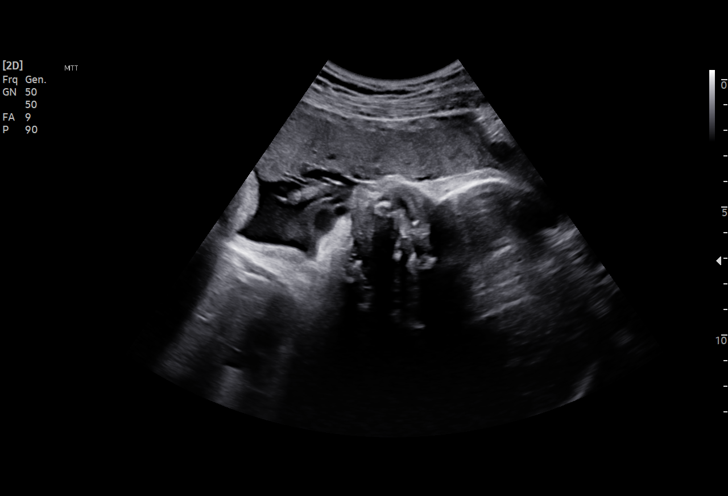

[13 of 28 positions shown; findings below may reference images not displayed]

[REDACTED] Health-                          [HOSPITAL] at

 2  US MFM UA CORD DOPPLER                76820.02    [facility]
                                                      HADJALI

Indications

 Maternal care for known or suspected poor
 fetal growth, third trimester, fetus 1 IUGR
 37 weeks gestation of pregnancy
 Uterine size-date discrepancy, third trimester
 Encounter for other antenatal screening
 follow-up
 Family history of VSD (Brother & Father)
Fetal Evaluation

 Num Of Fetuses:         1
 Fetal Heart Rate(bpm):  140
 Cardiac Activity:       Observed
 Presentation:           Cephalic
 Placenta:               Anterior
 P. Cord Insertion:      Previously Visualized

 Amniotic Fluid
 AFI FV:      Subjectively low-normal

 AFI Sum(cm)     %Tile       Largest Pocket(cm)
 9.4             21

 RUQ(cm)       RLQ(cm)       LUQ(cm)        LLQ(cm)
 2.1           1.8           2
Biophysical Evaluation

 Amniotic F.V:   Pocket => 2 cm             F. Tone:        Observed
 F. Movement:    Observed                   Score:          [DATE]
 F. Breathing:   Observed
OB History

 Gravidity:    2
 Living:       0
Gestational Age

 LMP:           37w 5d        Date:  07/27/20                 EDD:   05/03/21
 Best:          37w 5d     Det. By:  LMP  (07/27/20)          EDD:   05/03/21
Anatomy

 Ventricles:            Appears normal         Stomach:                Appears normal, left
                                                                       sided
 Thoracic:              Appears normal         Kidneys:                Appear normal
 Heart:                 Appears normal         Bladder:                Appears normal
                        (4CH, axis, and
                        situs)
 Diaphragm:             Appears normal

 Other:  Technically difficult due to advanced GA and fetal position.
Doppler - Fetal Vessels

 Umbilical Artery
  S/D     %tile      RI    %tile      PI    %tile     PSV    ADFV    RDFV
                                                    (cm/s)
  2.87       82    0.65       86     1.1       93    60.72      No      No

Cervix Uterus Adnexa

 Cervix
 Normal appearance by transabdominal scan.

 Uterus
 No abnormality visualized.

 Right Ovary
 Within normal limits.

 Left Ovary
 Within normal limits.

 Cul De Sac
 No free fluid seen.

 Adnexa
 No adnexal mass visualized.
Comments

 HEPPU was seen due to an IUGR fetus.  She denies
 any problems since her last exam.  She reports feeling
 vigorous fetal movements throughout the day.
 A biophysical profile performed today was [DATE].
 There was normal amniotic fluid noted on today's ultrasound
 exam.
 Doppler studies of the umbilical arteries performed due to
 fetal growth restriction showed a normal S/D ratio of 2.87.
 There were no signs of absent or reversed end-diastolic flow
 noted today.
 On today's exam, a fetal heart defect is suspected.  There is
 at least a VSD and a probable overriding aorta indicating
 possible Tetralogy of Shafeq Ahmead.  The views of the fetal heart were
 limited today due to her advanced gestational age.

 The patient is scheduled for a fetal echocardiogram with [HOSPITAL]
 pediatric cardiology on [DATE] (in 2 days).  I have notified
 Dr. Sylla regarding our suspicion of a fetal heart defect.  He
 will make further recommendations regarding the most
 optimal institution for delivery after he sees her.

 I will update the team once I hear back from him.

 Due to IUGR, I will make arrangements for the patient to be
 delivered at the appropriate institution probably later this
 week.

## 2023-04-08 ENCOUNTER — Encounter: Payer: Self-pay | Admitting: Obstetrics

## 2023-04-08 ENCOUNTER — Other Ambulatory Visit (HOSPITAL_COMMUNITY)
Admission: RE | Admit: 2023-04-08 | Discharge: 2023-04-08 | Disposition: A | Discharge status: Home / Self Care | Payer: MEDICAID | Source: Ambulatory Visit | Attending: Licensed Practical Nurse | Admitting: Licensed Practical Nurse

## 2023-04-08 ENCOUNTER — Ambulatory Visit (INDEPENDENT_AMBULATORY_CARE_PROVIDER_SITE_OTHER): Payer: MEDICAID | Admitting: Obstetrics

## 2023-04-08 VITALS — BP 102/58 | HR 84 | Wt 201.7 lb

## 2023-04-08 DIAGNOSIS — Z113 Encounter for screening for infections with a predominantly sexual mode of transmission: Secondary | ICD-10-CM | POA: Diagnosis present

## 2023-04-08 DIAGNOSIS — O23591 Infection of other part of genital tract in pregnancy, first trimester: Secondary | ICD-10-CM | POA: Insufficient documentation

## 2023-04-08 DIAGNOSIS — A5901 Trichomonal vulvovaginitis: Secondary | ICD-10-CM | POA: Diagnosis not present

## 2023-04-08 DIAGNOSIS — Z3A12 12 weeks gestation of pregnancy: Secondary | ICD-10-CM | POA: Diagnosis not present

## 2023-04-08 DIAGNOSIS — A5402 Gonococcal vulvovaginitis, unspecified: Secondary | ICD-10-CM | POA: Diagnosis not present

## 2023-04-08 DIAGNOSIS — Z3481 Encounter for supervision of other normal pregnancy, first trimester: Secondary | ICD-10-CM | POA: Diagnosis not present

## 2023-04-08 DIAGNOSIS — Z1379 Encounter for other screening for genetic and chromosomal anomalies: Secondary | ICD-10-CM

## 2023-04-08 DIAGNOSIS — B3731 Acute candidiasis of vulva and vagina: Secondary | ICD-10-CM | POA: Insufficient documentation

## 2023-04-08 DIAGNOSIS — B9689 Other specified bacterial agents as the cause of diseases classified elsewhere: Secondary | ICD-10-CM | POA: Insufficient documentation

## 2023-04-08 NOTE — Progress Notes (Signed)
OBSTETRIC INITIAL PRENATAL VISIT  Subjective:    Melissa [name] is being seen today for her first obstetrical visit.  This is a planned pregnancy. She is a 27 y.o. [id number] female at [redacted]w[redacted]d gestation, Estimated Date of Delivery: 10/17/23 with No LMP recorded (lmp unknown). Patient is pregnant.,  consistent with early 6  week sono. Her obstetrical history is significant for  hx of mood issues  .This I also a closely spaced pregnancy, as she just delivered in March of this year. She has a CPS file with the surrender of an earlier baby per DSS. Relationship with FOB: significant other, not living together. Patient does intend to breast feed. Pregnancy history fully reviewed.    OB History  Gravida Para Term Preterm AB Living  4 2 2  0 1 2  SAB IAB Ectopic Multiple Live Births  1 0 0 0 2    # Outcome Date GA Lbr Len/2nd Weight Sex Type Anes PTL Lv  4 Current           3 Term 11/01/22 [redacted]w[redacted]d / 00:07 5 lb 1.5 oz (2.31 kg) F Vag-Spont EPI  LIV     Name: CRISTIAN, SLAVEY     Apgar1: 8  Apgar5: 9  2 Term 04/25/21 [redacted]w[redacted]d  5 lb 9 oz (2.523 kg) F Vag-Spont  N LIV  1 SAB 2014 [redacted]w[redacted]d           Gynecologic History:  Last pap smear was 07/02/2022.  Results were Normal.  denies h/o abnormal pap smears in the past.  denies history of STIs.  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    Family History  Problem Relation Age of Onset   Asthma Mother    Hypertension Father    Healthy Brother    Healthy Maternal Grandmother    Healthy  Maternal Grandfather    Healthy Paternal Grandmother    Healthy Paternal Grandfather    Cancer Neg Hx    Diabetes Neg Hx    Heart disease Neg Hx    Stroke Neg Hx     Past Surgical History:  Procedure Laterality Date   chest tube  06/11/2020    Social History   Socioeconomic History   Marital status: Single    Spouse name: Not on file   Number of children: 2   Years of education: 12   Highest education level: Some college, no degree  Occupational History   Occupation: unemployed  Tobacco Use   Smoking status: Never   Smokeless tobacco: Never  Vaping Use   Vaping status: Never Used  Substance and Sexual Activity   Alcohol use: Not Currently   Drug use: Not Currently    Types: Marijuana   Sexual activity: Yes    Partners: Male    Birth control/protection: None  Other Topics Concern   Not on file  Social History Narrative   Not on file   Social Determinants of Corporate investment banker  Strain: Low Risk  (02/15/2023)   Overall Financial Resource Strain (CARDIA)    Difficulty of Paying Living Expenses: Not very hard  Food Insecurity: No Food Insecurity (02/15/2023)   Hunger Vital Sign    Worried About Running Out of Food in the Last Year: Never true    Ran Out of Food in the Last Year: Never true  Transportation Needs: No Transportation Needs (02/15/2023)   PRAPARE - Administrator, Civil Service (Medical): No    Lack of Transportation (Non-Medical): No  Physical Activity: Inactive (02/15/2023)   Exercise Vital Sign    Days of Exercise per Week: 0 days    Minutes of Exercise per Session: 0 min  Stress: No Stress Concern Present (02/15/2023)   [employer] of Occupational Health - Occupational Stress Questionnaire    Feeling of Stress : Not at all  Social Connections: Socially Isolated (02/15/2023)   Social Connection and Isolation Panel [NHANES]    Frequency of Communication with Friends and Family: More than three times a week    Frequency of  Social Gatherings with Friends and Family: More than three times a week    Attends Religious Services: Never    Database administrator or Organizations: No    Attends Banker Meetings: Never    Marital Status: Never married  Intimate Partner Violence: Not At Risk (02/15/2023)   Humiliation, Afraid, Rape, and Kick questionnaire    Fear of Current or Ex-Partner: No    Emotionally Abused: No    Physically Abused: No    Sexually Abused: No    Current Outpatient Medications on File Prior to Visit  Medication Sig Dispense Refill   ARIPiprazole ER (ABILIFY MAINTENA) 400 MG PRSY prefilled syringe Inject 400 mg into the muscle every 28 (twenty-eight) days. 1 each 11   metroNIDAZOLE (FLAGYL) 500 MG tablet Take 1 tablet (500 mg total) by mouth 2 (two) times daily. 14 tablet 0   ondansetron (ZOFRAN) 4 MG tablet Take 1 tablet (4 mg total) by mouth every 8 (eight) hours as needed for nausea or vomiting. 20 tablet 0   Prenatal Vit-Fe Fumarate-FA (PRENATAL MULTIVITAMIN) TABS tablet Take 1 tablet by mouth daily at 12 noon.     No current facility-administered medications on file prior to visit.    No Known Allergies   Review of Systems General: Not Present- Fever, Weight Loss and Weight Gain. Skin: Not Present- Rash. HEENT: Not Present- Blurred Vision, Headache and Bleeding Gums. Respiratory: Not Present- Difficulty Breathing. Breast: Not Present- Breast Mass. Cardiovascular: Not Present- Chest Pain, Elevated Blood Pressure, Fainting / Blacking Out and Shortness of Breath. Gastrointestinal: Not Present- Abdominal Pain, Constipation, Nausea and Vomiting. Female Genitourinary: Not Present- Frequency, Painful Urination, Pelvic Pain, Vaginal Bleeding, Vaginal Discharge, Contractions, regular, Fetal Movements Decreased, Urinary Complaints and Vaginal Fluid. Musculoskeletal: Not Present- Back Pain and Leg Cramps. Neurological: Not Present- Dizziness. Psychiatric: Not Present- Depression.      Objective:   General Appearance:    Alert, cooperative, no distress, appears stated age  Head:    Normocephalic, without obvious abnormality, atraumatic  Eyes:    PERRL, conjunctiva/corneas clear, EOM's intact, both eyes  Ears:    Normal external ear canals, both ears  Nose:   Nares normal, septum midline, mucosa normal, no drainage or sinus tenderness  Throat:   Lips, mucosa, and tongue normal; teeth and gums normal  Neck:   Supple, symmetrical, trachea midline, no adenopathy; thyroid: no enlargement/tenderness/nodules; no carotid bruit or JVD  Back:     Symmetric, no curvature, ROM normal, no CVA tenderness  Lungs:     Clear to auscultation bilaterally, respirations unlabored  Chest Wall:    No tenderness or deformity   Heart:    Regular rate and rhythm, S1 and S2 normal, no murmur, rub or gallop  Breast Exam:    No tenderness, masses, or nipple abnormality  Abdomen:     Soft, non-tender, bowel sounds active all four quadrants, no masses, no organomegaly.  FHT 160s  bpm.  Genitalia:    Pelvic:external genitalia normal, vagina without lesions, discharge, or tenderness, rectovaginal septum  normal. Cervix normal in appearance, no cervical motion tenderness, no adnexal masses or tenderness.  Pregnancy positive findings: uterine enlargement: anteverted, 12 wk size, nontender.   Rectal:    Normal external sphincter.  No hemorrhoids appreciated. Internal exam not done.   Extremities:   Extremities normal, atraumatic, no cyanosis or edema  Pulses:   2+ and symmetric all extremities  Skin:   Skin color, texture, turgor normal, no rashes or lesions  Lymph nodes:   Cervical, supraclavicular, and axillary nodes normal  Neurologic:   CNII-XII intact, normal strength, sensation and reflexes throughout     Assessment:   1. Screening for STD (sexually transmitted disease)   2. Genetic screening   3. [redacted] weeks gestation of pregnancy   4. Encounter for supervision of other normal pregnancy in  first trimester     Plan:   Supervision of other normal pregnancy   - Initial labs reviewed. - Prenatal vitamins encouraged. - Problem list reviewed and updated. - New OB counseling:  The patient has been given an overview regarding routine prenatal care.  Recommendations regarding diet, weight gain, and exercise in pregnancy were given. - Prenatal testing, optional genetic testing, and ultrasound use in pregnancy were reviewed.  Traditional genetic screening vs cell-fee DNA genetic screening discussed, including risks and benefits. Testing ordered. - Benefits of Breast Feeding were discussed. The patient is encouraged to consider nursing her baby post partum.  1. Screening for STD (sexually transmitted disease) Aptima swab retrieved  2. Genetic screening Desires MaternT testing.  3. [redacted] weeks gestation of pregnancy + FHTS heard today.  4. Encounter for supervision of other normal pregnancy in first trimester Labwork drawn today, including her MaternT test. Vaginal odor noted- Atpima swab sent. Likely BV. Will send in a prescription for Metronidazole. RTC in 4 wks for ROB. No pap- not due.      Follow up in 4 weeks.    Paula Compton, CNM [facility]

## 2023-04-09 ENCOUNTER — Ambulatory Visit (INDEPENDENT_AMBULATORY_CARE_PROVIDER_SITE_OTHER): Payer: MEDICAID | Admitting: Psychiatry

## 2023-04-09 ENCOUNTER — Encounter: Payer: Self-pay | Admitting: Obstetrics

## 2023-04-09 ENCOUNTER — Emergency Department
Admission: EM | Admit: 2023-04-09 | Discharge: 2023-04-10 | Disposition: A | Discharge status: Home / Self Care | Payer: MEDICAID | Attending: Emergency Medicine | Admitting: Emergency Medicine

## 2023-04-09 ENCOUNTER — Encounter (HOSPITAL_COMMUNITY): Payer: Self-pay | Admitting: Psychiatry

## 2023-04-09 ENCOUNTER — Other Ambulatory Visit: Payer: Self-pay

## 2023-04-09 DIAGNOSIS — J45909 Unspecified asthma, uncomplicated: Secondary | ICD-10-CM | POA: Diagnosis not present

## 2023-04-09 DIAGNOSIS — N390 Urinary tract infection, site not specified: Secondary | ICD-10-CM

## 2023-04-09 DIAGNOSIS — O2341 Unspecified infection of urinary tract in pregnancy, first trimester: Secondary | ICD-10-CM | POA: Diagnosis not present

## 2023-04-09 DIAGNOSIS — Z3A13 13 weeks gestation of pregnancy: Secondary | ICD-10-CM | POA: Diagnosis not present

## 2023-04-09 DIAGNOSIS — Z79899 Other long term (current) drug therapy: Secondary | ICD-10-CM

## 2023-04-09 DIAGNOSIS — O4690 Antepartum hemorrhage, unspecified, unspecified trimester: Secondary | ICD-10-CM

## 2023-04-09 DIAGNOSIS — O99511 Diseases of the respiratory system complicating pregnancy, first trimester: Secondary | ICD-10-CM | POA: Diagnosis not present

## 2023-04-09 DIAGNOSIS — F250 Schizoaffective disorder, bipolar type: Secondary | ICD-10-CM | POA: Diagnosis not present

## 2023-04-09 DIAGNOSIS — O209 Hemorrhage in early pregnancy, unspecified: Secondary | ICD-10-CM | POA: Insufficient documentation

## 2023-04-09 LAB — CBC
HCT: 37.4 % (ref 36.0–46.0)
Hemoglobin: 12.4 g/dL (ref 12.0–15.0)
MCH: 27.9 pg (ref 26.0–34.0)
MCHC: 33.2 g/dL (ref 30.0–36.0)
MCV: 84.2 fL (ref 80.0–100.0)
Platelets: 320 10*3/uL (ref 150–400)
RBC: 4.44 MIL/uL (ref 3.87–5.11)
RDW: 12.8 % (ref 11.5–15.5)
WBC: 11.9 10*3/uL — ABNORMAL HIGH (ref 4.0–10.5)
nRBC: 0 % (ref 0.0–0.2)

## 2023-04-09 MED ORDER — ACETAMINOPHEN 500 MG PO TABS
1000.0000 mg | ORAL_TABLET | Freq: Once | ORAL | Status: AC
  Administered 2023-04-09: 1000 mg via ORAL
  Filled 2023-04-09: qty 2
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
## 2023-04-09 NOTE — ED Provider Notes (Signed)
[name] Provider Note    Event Date/Time   First MD Initiated Contact with Patient 04/09/23 2315     (approximate)   History   Vaginal Bleeding   HPI  Melissa [name] is a 27 y.o. female G4 P2 who presents to the emergency department with vaginal bleeding that started today and lower abdominal cramping.  Passed a small clot at home.  No fever.  No dysuria but did have some vaginal itching that has resolved.  Was seen by her OB/GYN yesterday at [facility] and had STI screening which is pending.  Urine at that time showed white blood cells and bacteria but she has not yet on antibiotics.  She has had 1 previous miscarriage.  States they did hear the baby's heartbeat yesterday.  She has had 1 ultrasound at 6 weeks confirming single IUP.  Patient is 12 weeks, 5 days today with estimated due date of 10/17/2023.   History provided by patient, EMS.    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 Authorizing Provider  ARIPiprazole ER (ABILIFY MAINTENA) 400 MG PRSY prefilled syringe Inject 400 mg into the muscle every 28 (twenty-eight) days. 02/19/23   Bahraini, Sarah A  metroNIDAZOLE (FLAGYL) 500 MG tablet Take 1 tablet (500 mg total) by mouth 2 (two) times daily. 03/03/23   Glenetta Borg, CNM  ondansetron (ZOFRAN) 4 MG tablet Take 1 tablet (4 mg total) by mouth every 8  (eight) hours as needed for nausea or vomiting. 03/03/23   Glenetta Borg, CNM  Prenatal Vit-Fe Fumarate-FA (PRENATAL MULTIVITAMIN) TABS tablet Take 1 tablet by mouth daily at 12 noon.    [provider]    Physical Exam   Triage Vital Signs: ED Triage Vitals  Encounter Vitals Group     BP 04/09/23 2317 121/74     Systolic BP Percentile --      Diastolic BP Percentile --      Pulse Rate 04/09/23 2317 74     Resp 04/09/23 2317 18     Temp 04/09/23 2317 98.3 F (36.8 C)     Temp Source 04/09/23 2317 Oral     SpO2 04/09/23 2317 99 %     Weight 04/09/23 2316 201 lb (91.2 kg)     Height 04/09/23 2316 5\' 7"  (1.702 m)     Head Circumference --      Peak Flow --      Pain Score 04/09/23 2316 8     Pain Loc --      Pain Education --      Exclude from Growth Chart --     Most recent vital signs: Vitals:   04/09/23 2317  BP: 121/74  Pulse: 74  Resp: 18  Temp: 98.3 F (36.8 C)  SpO2: 99%  CONSTITUTIONAL: Alert, responds appropriately to questions. Well-appearing; well-nourished HEAD: Normocephalic, atraumatic EYES: Conjunctivae clear, pupils appear equal, sclera nonicteric ENT: normal nose; moist mucous membranes NECK: Supple, normal ROM CARD: RRR; S1 and S2 appreciated RESP: Normal chest excursion without splinting or tachypnea; breath sounds clear and equal bilaterally; no wheezes, no rhonchi, no rales, no hypoxia or respiratory distress, speaking full sentences ABD/GI: Non-distended; soft, non-tender, no rebound, no guarding, no peritoneal signs BACK: The back appears normal EXT: Normal ROM in all joints; no deformity noted, no edema SKIN: Normal color for age and race; warm; no rash on exposed skin NEURO: Moves all extremities equally, normal speech PSYCH: The patient's mood and manner are appropriate.   ED Results / Procedures / Treatments   LABS: (all labs ordered are listed, but only abnormal results are displayed) Labs Reviewed  URINE CULTURE  HCG,  QUANTITATIVE, PREGNANCY  URINALYSIS, ROUTINE W REFLEX MICROSCOPIC  CBC     EKG:   RADIOLOGY: My personal review and interpretation of imaging:  ***  I have personally reviewed all radiology reports.   No results found.   PROCEDURES:  Critical Care performed: No    Procedures    IMPRESSION / MDM / ASSESSMENT AND PLAN / ED COURSE  I reviewed the triage vital signs and the nursing notes.    Patient here with vaginal bleeding in the first trimester.  The patient is on the cardiac monitor to evaluate for evidence of arrhythmia and/or significant heart rate changes.   DIFFERENTIAL DIAGNOSIS (includes but not limited to):   Subchorionic hemorrhage, miscarriage, implantation bleeding, STI, UTI   Patient's presentation is most consistent with acute complicated illness / injury requiring diagnostic workup.   PLAN: Will obtain labs, urine, transvaginal ultrasound.  Will give Tylenol for pain.  We discussed holding off on repeating STI screening today given this was just done yesterday and she is awaiting her results.   MEDICATIONS GIVEN IN ED: Medications  acetaminophen (TYLENOL) tablet 1,000 mg (1,000 mg Oral Given 04/09/23 2336)     ED COURSE:  ***   CONSULTS:  ***   OUTSIDE RECORDS REVIEWED: Reviewed OB/GYN note from yesterday.       FINAL CLINICAL IMPRESSION(S) / ED DIAGNOSES   Final diagnoses:  Vaginal bleeding in pregnancy     Rx / DC Orders   ED Discharge Orders     None        Note:  This document was prepared using Dragon voice recognition software and may include unintentional dictation errors.

## 2023-04-09 NOTE — Patient Instructions (Signed)
Thank you for attending your appointment today.  -- We did not make any medication changes today. Please continue medications as prescribed. -- Pay attention to your symptoms like mood and irritability in relationship to your injection to see if your injection may be "wearing off."   Please do not make any changes to medications without first discussing with your provider. If you are experiencing a psychiatric emergency, please call 911 or present to your nearest emergency department. Additional crisis, medication management, and therapy resources are included below.  [facility]  [address], [place], [place] [address] [phone] WALK-IN URGENT CARE 24/7 FOR ANYONE [address] [place], [place]  [phone] Fax: [phone] [email or web address] *Interpreters available *Accepts all insurance and uninsured for Urgent Care needs *Accepts Medicaid and uninsured for outpatient treatment (below)      ONLY FOR [facility]  Below:    Outpatient New Patient Assessment/Therapy Walk-ins:        Monday -Thursday 8am until slots are full.        Every Friday 1pm-4pm  (first come, first served)                   New Patient Psychiatry/Medication Management        Monday-Friday 8am-11am (first come, first served)               For all walk-ins we ask that you arrive by 7:15am, because patients will be seen in the order of arrival.

## 2023-04-09 NOTE — ED Triage Notes (Signed)
Pt to ED via EMS from home, pt is G4P2 pt is aprox [redacted] weeks pregnant, states she began with abd cramping and vaginal bleeding yesterday afternoon. Pt states bleeding is not very heavy at this time. Pt is seen at [facility].

## 2023-04-10 ENCOUNTER — Emergency Department: Payer: MEDICAID

## 2023-04-10 ENCOUNTER — Encounter (HOSPITAL_COMMUNITY): Payer: Self-pay | Admitting: Obstetrics and Gynecology

## 2023-04-10 ENCOUNTER — Observation Stay (HOSPITAL_COMMUNITY)
Admission: AD | Admit: 2023-04-10 | Discharge: 2023-04-11 | Disposition: A | Discharge status: Home / Self Care | Payer: MEDICAID | Attending: Obstetrics & Gynecology | Admitting: Obstetrics & Gynecology

## 2023-04-10 DIAGNOSIS — N390 Urinary tract infection, site not specified: Secondary | ICD-10-CM | POA: Insufficient documentation

## 2023-04-10 DIAGNOSIS — J45909 Unspecified asthma, uncomplicated: Secondary | ICD-10-CM | POA: Insufficient documentation

## 2023-04-10 DIAGNOSIS — D62 Acute posthemorrhagic anemia: Secondary | ICD-10-CM

## 2023-04-10 DIAGNOSIS — Z7901 Long term (current) use of anticoagulants: Secondary | ICD-10-CM | POA: Insufficient documentation

## 2023-04-10 DIAGNOSIS — O039 Complete or unspecified spontaneous abortion without complication: Principal | ICD-10-CM

## 2023-04-10 DIAGNOSIS — Z3A12 12 weeks gestation of pregnancy: Secondary | ICD-10-CM

## 2023-04-10 DIAGNOSIS — O99511 Diseases of the respiratory system complicating pregnancy, first trimester: Secondary | ICD-10-CM | POA: Diagnosis not present

## 2023-04-10 DIAGNOSIS — A549 Gonococcal infection, unspecified: Secondary | ICD-10-CM

## 2023-04-10 LAB — CERVICOVAGINAL ANCILLARY ONLY
Bacterial Vaginitis (gardnerella): POSITIVE — AB
Candida Glabrata: NEGATIVE
Candida Vaginitis: POSITIVE — AB
Chlamydia: NEGATIVE
Comment: NEGATIVE
Comment: NEGATIVE
Comment: NEGATIVE
Comment: NEGATIVE
Comment: NEGATIVE
Comment: NORMAL
Neisseria Gonorrhea: POSITIVE — AB
Trichomonas: POSITIVE — AB

## 2023-04-10 LAB — CBC WITH DIFFERENTIAL/PLATELET
Abs Immature Granulocytes: 0.11 10*3/uL — ABNORMAL HIGH (ref 0.00–0.07)
Basophils Absolute: 0 10*3/uL (ref 0.0–0.1)
Basophils Relative: 0 %
Eosinophils Absolute: 0 10*3/uL (ref 0.0–0.5)
Eosinophils Relative: 0 %
HCT: 25.1 % — ABNORMAL LOW (ref 36.0–46.0)
Hemoglobin: 8.6 g/dL — ABNORMAL LOW (ref 12.0–15.0)
Immature Granulocytes: 1 %
Lymphocytes Relative: 8 %
Lymphs Abs: 1.3 10*3/uL (ref 0.7–4.0)
MCH: 29.7 pg (ref 26.0–34.0)
MCHC: 34.3 g/dL (ref 30.0–36.0)
MCV: 86.6 fL (ref 80.0–100.0)
Monocytes Absolute: 1 10*3/uL (ref 0.1–1.0)
Monocytes Relative: 6 %
Neutro Abs: 14.5 10*3/uL — ABNORMAL HIGH (ref 1.7–7.7)
Neutrophils Relative %: 85 %
Platelets: 233 10*3/uL (ref 150–400)
RBC: 2.9 MIL/uL — ABNORMAL LOW (ref 3.87–5.11)
RDW: 12.9 % (ref 11.5–15.5)
WBC: 17 10*3/uL — ABNORMAL HIGH (ref 4.0–10.5)
nRBC: 0 % (ref 0.0–0.2)

## 2023-04-10 LAB — NICOTINE SCREEN, URINE: Cotinine Ql Scrn, Ur: NEGATIVE ng/mL

## 2023-04-10 LAB — PROTIME-INR
INR: 1.4 — ABNORMAL HIGH (ref 0.8–1.2)
Prothrombin Time: 17.6 seconds — ABNORMAL HIGH (ref 11.4–15.2)

## 2023-04-10 LAB — HCG, QUANTITATIVE, PREGNANCY: hCG, Beta Chain, Quant, S: 67752 m[IU]/mL — ABNORMAL HIGH

## 2023-04-10 LAB — PREPARE RBC (CROSSMATCH)

## 2023-04-10 MED ORDER — LACTATED RINGERS IV SOLN: 125.0000 mL/h | INTRAVENOUS | Status: DC

## 2023-04-10 MED ORDER — NITROFURANTOIN MONOHYD MACRO 100 MG PO CAPS: 100.0000 mg | ORAL_CAPSULE | Freq: Two times a day (BID) | ORAL | 0 refills | Status: AC

## 2023-04-10 MED ORDER — PRENATAL MULTIVITAMIN CH: 1.0000 | ORAL_TABLET | Freq: Every day | ORAL | Status: DC

## 2023-04-10 MED ORDER — OXYTOCIN-SODIUM CHLORIDE 30-0.9 UT/500ML-% IV SOLN: 1.0000 m[IU]/min | INTRAVENOUS | Status: DC

## 2023-04-10 MED ORDER — KETOROLAC TROMETHAMINE 60 MG/2ML IM SOLN
60.0000 mg | Freq: Once | INTRAMUSCULAR | Status: AC
  Administered 2023-04-10: 60 mg via INTRAMUSCULAR
  Filled 2023-04-10: qty 2

## 2023-04-10 MED ORDER — OXYTOCIN-SODIUM CHLORIDE 30-0.9 UT/500ML-% IV SOLN
INTRAVENOUS | Status: AC
  Administered 2023-04-10: 50 m[IU]/min via INTRAVENOUS
  Filled 2023-04-10: qty 500

## 2023-04-10 MED ORDER — SODIUM CHLORIDE 0.9% IV SOLUTION: Freq: Once | INTRAVENOUS | Status: DC

## 2023-04-10 MED ORDER — MISOPROSTOL 200 MCG PO TABS
400.0000 ug | ORAL_TABLET | Freq: Once | ORAL | Status: AC
  Administered 2023-04-10: 400 ug via ORAL
  Filled 2023-04-10: qty 2

## 2023-04-10 MED ORDER — ACETAMINOPHEN 325 MG PO TABS
650.0000 mg | ORAL_TABLET | ORAL | Status: DC | PRN
  Administered 2023-04-11: 650 mg via ORAL
  Filled 2023-04-10: qty 2

## 2023-04-10 MED ORDER — SODIUM CHLORIDE 0.9% IV SOLUTION: Freq: Once | INTRAVENOUS | Status: AC

## 2023-04-10 MED ORDER — CALCIUM CARBONATE ANTACID 500 MG PO CHEW: 2.0000 | CHEWABLE_TABLET | ORAL | Status: DC | PRN

## 2023-04-10 MED ORDER — NITROFURANTOIN MONOHYD MACRO 100 MG PO CAPS
100.0000 mg | ORAL_CAPSULE | Freq: Once | ORAL | Status: AC
  Administered 2023-04-10: 100 mg via ORAL
  Filled 2023-04-10: qty 1

## 2023-04-10 MED ORDER — OXYTOCIN-SODIUM CHLORIDE 30-0.9 UT/500ML-% IV SOLN: 50.0000 m[IU]/min | INTRAVENOUS | Status: DC

## 2023-04-10 MED ORDER — OXYTOCIN-SODIUM CHLORIDE 30-0.9 UT/500ML-% IV SOLN: 125.0000 m[IU]/min | INTRAVENOUS | Status: DC

## 2023-04-10 MED ORDER — FENTANYL CITRATE (PF) 100 MCG/2ML IJ SOLN
50.0000 ug | Freq: Once | INTRAMUSCULAR | Status: AC
  Administered 2023-04-10: 50 ug via INTRAVENOUS
  Filled 2023-04-10: qty 2

## 2023-04-10 MED ORDER — DOCUSATE SODIUM 100 MG PO CAPS
100.0000 mg | ORAL_CAPSULE | Freq: Every day | ORAL | Status: DC
  Administered 2023-04-10: 100 mg via ORAL
  Filled 2023-04-10: qty 1

## 2023-04-10 MED ORDER — LACTATED RINGERS IV BOLUS
1000.0000 mL | Freq: Once | INTRAVENOUS | Status: AC
  Administered 2023-04-10: 1000 mL via INTRAVENOUS

## 2023-04-10 NOTE — Discharge Instructions (Addendum)
You may take over-the-counter Tylenol 1000 mg every 6 hours as needed for pain.  Ultrasound showed normal cardiac activity.  Please return to the emergency department if you begin bleeding heavily and are soaking through more than 1 pad an hour for more than 2 straight hours, have vomiting that will not stop, pain that is uncontrolled with over-the-counter medications.  I recommend follow-up with your OB/GYN.

## 2023-04-10 NOTE — MAU Note (Signed)
.  Melissa [name] is a 27 y.o. at [redacted]w[redacted]d here in MAU reporting: was seen at [facility] yesterday for cramping, states she has continued to cramp all throughout the night. Delivered the baby around 0600 this morning. She also has a moderate amount of bleeding and the placenta is still intact.   Pain score: 5 Vitals:   04/10/23 0955 04/10/23 1022  BP: 107/64 117/62  Pulse:  82  Resp: 16   SpO2:  99%

## 2023-04-10 NOTE — MAU Provider Note (Signed)
Chief Complaint:  Vaginal Bleeding (Arrived by EMS, bleeding started 8/14 @ 0600. Patient on oxygen by nasal cannula. VS obtained. MD and RN at bedside. )   HPI   Event Date/Time   First Provider Initiated Contact with Patient 04/10/23 1031      Melissa [name] is a 27 y.o. [id number] at [redacted]w[redacted]d who presents to maternity admissions via EMS reporting bleeding that started 8/14 around 0600.  EMS reports that she believes she was having a spontaneous abortion and bleeding much more than her prior abortion. They report that she was seen at [facility] last night and was told nothing to worry about but was sent home with UTI abx.  Per chart review, 6/12 [place] single intrauterine gestation [redacted]w[redacted]d, 6/27 [place] [redacted]w[redacted]d and remained w/o subchorionic hemorrhage, and at [facility] on 8/14 [redacted]w[redacted]d with cardiac activity, no comment on cervix.  ED note from 8/13 notes treating with Macrobid given infectious urine from OBGYN visit, Hgb 12.4, hCG 67K, Bpositive.  No cervical length ordered or discussed but states was counseled on threatened miscarriage. EMS also reports that she blacked out when they were transitioning to the stretcher for transport, SBP 60 at that time.  They gave 1L of NS enroute and had had an additional 500cc bolus hung upon arrival.  Upon arrival patient had GCS 15, alert and oriented x 3, but shivering.  She states had gushes of vaginal bleeding and believes she aborted the infant but that placenta is still within uterus.  Upon inspection the infant was within the chuck pad with umbilical cord tracking back within patient's vaginal.  Bleeding seemed to be controlled.  Patient denies F/C, N/V, CP, SOB, or AMS. Endorses being cold and having abdominal cramping.  She reports no other medications outside of her PNVs than her monthly Abilify injections (due 8/15). Of note, she just had an IOL and successful delivery of a full term infant at [redacted]w[redacted]d on 11/03/2022.    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 06/27/2022   OB History  Gravida Para Term Preterm AB Living  4 2 2  0 1 2  SAB IAB Ectopic Multiple Live Births  1 0 0 0 2    # Outcome Date GA Lbr Len/2nd Weight Sex Type Anes PTL Lv  4 Current           3 Term 11/01/22 [redacted]w[redacted]d / 00:07 2310 g F Vag-Spont EPI  LIV  2 Term 04/25/21 [redacted]w[redacted]d  2523 g F Vag-Spont  N LIV  1 SAB 2014 [redacted]w[redacted]d          Past Surgical History:  Procedure Laterality Date   chest tube  06/11/2020   Family History  Problem Relation Age of Onset   Asthma Mother    Hypertension Father    Healthy Brother    Healthy Maternal Grandmother    Healthy Maternal Grandfather    Healthy Paternal Grandmother    Healthy Paternal Grandfather    Cancer Neg Hx  Diabetes Neg Hx    Heart disease Neg Hx    Stroke Neg Hx    Social History   Tobacco Use   Smoking status: Never   Smokeless tobacco: Never  Vaping Use   Vaping status: Never Used  Substance Use Topics   Alcohol use: Not Currently   Drug use: Not Currently    Types: Marijuana   No Known Allergies Medications Prior to Admission  Medication Sig Dispense Refill Last Dose   ARIPiprazole ER (ABILIFY MAINTENA) 400 MG PRSY prefilled syringe Inject 400 mg into the muscle every 28 (twenty-eight) days. 1 each 11 Past Month   metroNIDAZOLE (FLAGYL) 500 MG tablet Take 1 tablet (500 mg total) by mouth 2 (two) times daily. 14 tablet 0    nitrofurantoin, macrocrystal-monohydrate, (MACROBID) 100 MG capsule Take 1 capsule (100 mg total) by mouth 2 (two) times daily for 7 days. 14 capsule 0     ondansetron (ZOFRAN) 4 MG tablet Take 1 tablet (4 mg total) by mouth every 8 (eight) hours as needed for nausea or vomiting. 20 tablet 0    Prenatal Vit-Fe Fumarate-FA (PRENATAL MULTIVITAMIN) TABS tablet Take 1 tablet by mouth daily at 12 noon.       I have reviewed patient's Past Medical Hx, Surgical Hx, Family Hx, Social Hx, medications and allergies.   ROS  Pertinent items noted in HPI and remainder of comprehensive ROS otherwise negative.   PHYSICAL EXAM  Patient Vitals for the past 24 hrs:  BP Temp src Pulse Resp SpO2  04/10/23 1026 (!) 118/57 -- 79 -- --  04/10/23 1025 -- -- -- -- 100 %  04/10/23 1022 117/62 -- 82 -- 99 %  04/10/23 1016 (!) 114/54 -- 81 -- --  04/10/23 1011 121/66 -- 83 -- --  04/10/23 1006 106/60 -- 79 -- --  04/10/23 1003 110/65 -- 86 -- --  04/10/23 0955 107/64 Oral -- 16 --    Constitutional: Well-developed, well-nourished female, ill-appearing   Cardiovascular: normal rate & rhythm, warm and well-perfused Respiratory: normal effort, no problems with respiration noted GI: Abd soft, non-distended, pelvic TTP  MS: Extremities nontender, no edema, normal ROM Neurologic: Alert and oriented x 4.  GU: no CVA tenderness Pelvic: fetus of about [redacted] weeks gestation in chuck pad w/o signs of life, umbilical cord still attached and tracing back within vagina, placenta not within vaginal vault       Labs: Results for orders placed or performed during the hospital encounter of 04/10/23 (from the past 24 hour(s))  CBC with Differential/Platelet     Status: Abnormal   Collection Time: 04/10/23 10:03 AM  Result Value Ref Range   WBC 17.0 (H) 4.0 - 10.5 K/uL   RBC 2.90 (L) 3.87 - 5.11 MIL/uL   Hemoglobin 8.6 (L) 12.0 - 15.0 g/dL   HCT 16.1 (L) 09.6 - 04.5 %   MCV 86.6 80.0 - 100.0 fL   MCH 29.7 26.0 - 34.0 pg   MCHC 34.3 30.0 - 36.0 g/dL   RDW 40.9 81.1 - 91.4 %   Platelets 233 150 - 400 K/uL   nRBC 0.0 0.0 - 0.2 %   Neutrophils Relative % 85 %   Neutro Abs  14.5 (H) 1.7 - 7.7 K/uL   Lymphocytes Relative 8 %   Lymphs Abs 1.3 0.7 - 4.0 K/uL   Monocytes Relative 6 %   Monocytes Absolute 1.0 0.1 - 1.0 K/uL   Eosinophils Relative 0 %   Eosinophils Absolute 0.0 0.0 -  0.5 K/uL   Basophils Relative 0 %   Basophils Absolute 0.0 0.0 - 0.1 K/uL   Immature Granulocytes 1 %   Abs Immature Granulocytes 0.11 (H) 0.00 - 0.07 K/uL  Type and screen [facility]     Status: None (Preliminary result)   Collection Time: 04/10/23 10:03 AM  Result Value Ref Range   ABO/RH(D) B POS    Antibody Screen NEG    Sample Expiration      04/13/2023,2359 Performed at [facility], [address] [place], [place] [address]    Unit Number [id number]    Blood Component Type RED CELLS,LR    Unit division 00    Status of Unit ALLOCATED    Transfusion Status OK TO TRANSFUSE    Crossmatch Result Compatible    Unit Number [id number]    Blood Component Type RBC LR PHER1    Unit division 00    Status of Unit ALLOCATED    Transfusion Status OK TO TRANSFUSE    Crossmatch Result Compatible   Protime-INR     Status: Abnormal   Collection Time: 04/10/23 10:03 AM  Result Value Ref Range   Prothrombin Time 17.6 (H) 11.4 - 15.2 seconds   INR 1.4 (H) 0.8 - 1.2  Prepare RBC (crossmatch)     Status: None   Collection Time: 04/10/23 10:20 AM  Result Value Ref Range   Order Confirmation      ORDER PROCESSED BY BLOOD BANK Performed at [facility], [address], [place], [place] [address]     Imaging:  [place] Comp Less 14 Wks  Result Date: 04/10/2023 CLINICAL DATA:  Pregnant, vaginal bleeding EXAM: OBSTETRIC <14 WK ULTRASOUND TECHNIQUE: Transabdominal ultrasound was performed for evaluation of the gestation as well as the maternal uterus and adnexal regions. COMPARISON:  02/21/2023 FINDINGS: Intrauterine gestational sac: Single Yolk sac:  Not Visualized. Embryo:  Visualized. Cardiac Activity: Visualized. Heart Rate: 158 bpm CRL:   78.1 mm    13 w 6 d                  [place] EDC: 10/10/2023 Subchorionic hemorrhage:  None visualized. Maternal uterus/adnexae: Bilateral ovaries are within normal limits. No free fluid. IMPRESSION: Single intrauterine gestation with cardiac activity, measuring 13 weeks 6 days by crown-rump length, as above. Electronically Signed   By: Charline Bills M.D.   On: 04/10/2023 00:52    MDM & MAU COURSE  MDM: moderate   MAU Course: Orders Placed This Encounter  Procedures   CBC with Differential/Platelet   Protime-INR   CBC   Diet regular Room service appropriate? Yes; Fluid consistency: Thin   Informed Consent Details: Physician/Practitioner Attestation; Transcribe to consent form and obtain patient signature   Notify physician (specify)   Vital signs   Defer vaginal exam for vaginal bleeding or PROM <37 weeks   Apply Antepartum Care Plan   Initiate Oral Care Protocol   Initiate Carrier Fluid Protocol   Full code   Type and screen [facility]   Prepare RBC (crossmatch)   Type and screen [name]   Place in observation (patient's expected length of stay will be less than 2 midnights)   Meds ordered this encounter  Medications   lactated ringers bolus 1,000 mL   lactated ringers bolus 1,000 mL   Oxytocin-Sodium Chloride 30-0.9 UT/500ML-% infusion    Flippin, Holly E: cabinet override   0.9 %  sodium chloride infusion (  Manually program via Guardrails IV Fluids)   DISCONTD: oxytocin (PITOCIN) IV infusion 30 units in NS 500 mL - Premix    Order Specific Question:   Begin infusion at:    Answer:   2 milli-units/min (2 mL/hr)    Order Specific Question:   Increase infusion by:    Answer:   2 milli-units/min (2 mL/hr)   DISCONTD: oxytocin (PITOCIN) IV infusion 30 units in NS 500 mL - Premix    Per Dr. Debroah Loop, delivery of placenta s/p spontaneous abortion    Order Specific Question:   Begin infusion at:    Answer:   2 milli-units/min (2 mL/hr)    Order Specific  Question:   Increase infusion by:    Answer:   2 milli-units/min (2 mL/hr)   oxytocin (PITOCIN) IV infusion 30 units in NS 500 mL - Premix   misoprostol (CYTOTEC) tablet 400 mcg   fentaNYL (SUBLIMAZE) injection 50 mcg   ketorolac (TORADOL) injection 60 mg   0.9 %  sodium chloride infusion (Manually program via Guardrails IV Fluids)   lactated ringers infusion   acetaminophen (TYLENOL) tablet 650 mg   docusate sodium (COLACE) capsule 100 mg   calcium carbonate (TUMS - dosed in mg elemental calcium) chewable tablet 400 mg of elemental calcium   prenatal multivitamin tablet 1 tablet    ASSESSMENT   1. Spontaneous abortion in first trimester   2. [redacted] weeks gestation of pregnancy      CBCdiff = hgb 12.4 > 8.6 (~4hrs post bleed) T&S ordered  Coags mildly elevated  UA = moderate LE, rare bacteria (previously written for Macrobid)  Ucx ordered  Surgical pathology   Ordered for Pitocin to assist with expulsion of placenta. Fentanyl 50 x 1, Toradol 60x1  Signed 2U pRBC, transfusing one unit given symptoms and significant downtrend in hgb, (EBL 789cc is what could be captured once patient presented, does not include in blood loss at home or with EMS).    PLAN  Admit to observation for OBGYN Care Unit 2/2 requiring blood transfusion.    Mittie Bodo, MD Family Medicine - Obstetrics Fellow

## 2023-04-10 NOTE — H&P (Signed)
Melissa [name] is a 27 y.o. female presenting for spontaneous abortion @ [redacted]weeks gestation c/b significant blood loss requiring blood transfusion.  Per MAU HPI: "Melissa [name] is a 27 y.o. [phone] at [redacted]w[redacted]d who presents to maternity admissions via EMS reporting bleeding that started 8/14 around 0600.  EMS reports that she believes she was having a spontaneous abortion and bleeding much more than her prior abortion. They report that she was seen at [facility] last night and was told nothing to worry about but was sent home with UTI abx.  Per chart review, 6/12 [place] single intrauterine gestation [redacted]w[redacted]d, 6/27 [place] [redacted]w[redacted]d and remained w/o subchorionic hemorrhage, and at [facility] on 8/14 [redacted]w[redacted]d with cardiac activity, no comment on cervix.  ED note from 8/13 notes treating with Macrobid given infectious urine from OBGYN visit, Hgb 12.4, hCG 67K, Bpositive.  No cervical length ordered or discussed but states was counseled on threatened miscarriage. EMS also reports that she blacked out when they were transitioning to the stretcher for transport, SBP 60 at that time.  They gave 1L of NS enroute and had had an additional 500cc bolus hung upon arrival.  Upon arrival patient had GCS 15, alert and oriented x 3, but shivering.  She states had gushes of vaginal bleeding and believes she aborted the infant but that placenta is still within uterus.  Upon inspection the infant was within the chuck pad with umbilical cord tracking back within patient's vaginal.  Bleeding seemed to be controlled.  Patient denies F/C, N/V, CP, SOB, or AMS. Endorses being cold and having abdominal cramping.  She reports no other medications outside of her PNVs than her monthly Abilify injections (due 8/15). Of note, she just had an IOL and successful delivery of a full term infant at [redacted]w[redacted]d on 11/03/2022." OB History     Gravida  4   Para  2   Term  2   Preterm  0   AB  1   Living  2      SAB  1   IAB  0   Ectopic   0   Multiple  0   Live Births  2          Past Medical History:  Diagnosis Date   Anxiety    Asthma    Blow out fracture of orbit (HCC) 01/30/2021   Chlamydia    Collapsed lung 06/11/2020   Depression    Dizziness    Fetal growth restriction antepartum 04/23/2021   Gonorrhea    Pneumothorax, traumatic 06/12/2020   PTSD (post-traumatic stress disorder)    Rib fracture    Schizoaffective disorder (HCC)    Syncope    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 Social History:  reports that she has never smoked. She has never used smokeless tobacco. She reports that she does not currently use alcohol. She reports that she does not currently use drugs after having used the following drugs: Marijuana.     Maternal Diabetes: No Genetic Screening: n/a  Maternal Ultrasounds/Referrals: Normal Fetal Ultrasounds or other Referrals:  None Maternal Substance Abuse:  No Significant Maternal Medications:  Meds include: Other: Abilify  Significant Maternal Lab Results:  Other:  Down  trending Hgb  Number of Prenatal Visits:Less than or equal to 3 verified prenatal visits Other Comments:   Spontaneous abortion 8/14   Review of Systems as per HPI History   Blood pressure (!) 118/57, pulse 79, resp. rate 16, SpO2 100%, not currently breastfeeding.  CONSTITUTIONAL: Well-developed, well-nourished female, ill-appearing HENT:  Normocephalic, atraumatic, External right and left ear normal.  EYES: Conjunctivae and EOM are normal. No scleral icterus.  NECK: Normal range of motion, supple, no masses. SKIN: Skin is warm and dry. No rash noted. Not diaphoretic. No erythema. Minimal  pallor. MUSCULOSKELETAL: Normal range of motion. No tenderness.  No cyanosis, clubbing, or edema. NEUROLOGIC: Alert and oriented to person, place, and time. Normal reflexes, muscle tone coordination.  PSYCHIATRIC: Normal mood and affect. Normal behavior. Normal judgment and thought content. CARDIOVASCULAR: Normal heart rate noted, regular rhythm,  Cap refill <2sec, lightheaded with ambulation  RESPIRATORY: Effort and breath sounds normal, no problems with respiration noted. ABDOMEN: Soft, no distention noted.  TTP over lower pelvic w/o rebound or guarding.  PELVIC: Normal appearing external genitalia and urethral meatus; normal appearing vaginal mucosa and cervix.  No abnormal vaginal discharge noted.  Bleeding now seems controlled  Normal uterine size, no other palpable masses, no uterine or adnexal tenderness.  Performed in the presence of a chaperone.  Prenatal labs: ABO, Rh: --/--/B POS (08/14 1003) Antibody: NEG (08/14 1003) Rubella: 6.54 (08/12 1559) RPR: Non Reactive (08/12 1559)  HBsAg: Negative (08/12 1559)  HIV: Non Reactive (08/12 1559)  GBS: Positive/-- (02/21 1156)   Assessment/Plan: #S/p Spontanous Abortion @12  weeks 6 days gestation, c/b symptomatic blood loss Seen at [name] for vaginal bleeding 8/13, 8/14 0600 significant gushing of blood from vagina.  Infant delivered prior to presentation but observed w/o signs of life on initial evaluation. Placenta delivered with Pitocin/Cytotec. EBL 789cc and not including home / EMS collections, Hgb 12.4 > 8.6 just 4 hours post bleed. B positive, no rhogam needed.  - 1U pRBC - IVF - f/u genprobe  #UTI Patient was sent with Macrobid from [place] early morning 8/14, repeat UA with moderate LE, rare bacteria, asymptomatic.  Denies F/C, CVA tenderness - low threshold to start rocephin if fevers - Ucx pending   Melissa [name] 04/10/2023, 12:17 PM

## 2023-04-11 ENCOUNTER — Encounter (HOSPITAL_COMMUNITY): Payer: Self-pay | Admitting: Obstetrics & Gynecology

## 2023-04-11 ENCOUNTER — Encounter (HOSPITAL_COMMUNITY): Payer: Self-pay

## 2023-04-11 ENCOUNTER — Ambulatory Visit (INDEPENDENT_AMBULATORY_CARE_PROVIDER_SITE_OTHER): Payer: MEDICAID | Admitting: *Deleted

## 2023-04-11 VITALS — BP 110/68 | HR 89 | Resp 16 | Ht 67.0 in | Wt 201.2 lb

## 2023-04-11 DIAGNOSIS — O039 Complete or unspecified spontaneous abortion without complication: Secondary | ICD-10-CM | POA: Diagnosis not present

## 2023-04-11 DIAGNOSIS — Z3A13 13 weeks gestation of pregnancy: Secondary | ICD-10-CM | POA: Diagnosis not present

## 2023-04-11 DIAGNOSIS — A549 Gonococcal infection, unspecified: Secondary | ICD-10-CM

## 2023-04-11 DIAGNOSIS — F250 Schizoaffective disorder, bipolar type: Secondary | ICD-10-CM | POA: Diagnosis not present

## 2023-04-11 DIAGNOSIS — D62 Acute posthemorrhagic anemia: Secondary | ICD-10-CM

## 2023-04-11 LAB — CBC
HCT: 21.9 % — ABNORMAL LOW (ref 36.0–46.0)
Hemoglobin: 7.6 g/dL — ABNORMAL LOW (ref 12.0–15.0)
MCH: 29 pg (ref 26.0–34.0)
MCHC: 34.7 g/dL (ref 30.0–36.0)
MCV: 83.6 fL (ref 80.0–100.0)
Platelets: 189 10*3/uL (ref 150–400)
RBC: 2.62 MIL/uL — ABNORMAL LOW (ref 3.87–5.11)
RDW: 13.3 % (ref 11.5–15.5)
WBC: 14 10*3/uL — ABNORMAL HIGH (ref 4.0–10.5)
nRBC: 0 % (ref 0.0–0.2)

## 2023-04-11 MED ORDER — ARIPIPRAZOLE ER 400 MG IM PRSY
400.0000 mg | PREFILLED_SYRINGE | Freq: Once | INTRAMUSCULAR | Status: AC
Start: 2023-04-11 — End: 2023-04-11
  Administered 2023-04-11: 400 mg via INTRAMUSCULAR

## 2023-04-11 MED ORDER — CEFTRIAXONE SODIUM 500 MG IJ SOLR
500.0000 mg | Freq: Once | INTRAMUSCULAR | Status: AC
  Administered 2023-04-11: 500 mg via INTRAMUSCULAR
  Filled 2023-04-11: qty 500

## 2023-04-11 MED ORDER — FERROUS SULFATE 325 (65 FE) MG PO TABS: 325.0000 mg | ORAL_TABLET | ORAL | 1 refills | Status: DC

## 2023-04-11 MED ORDER — IBUPROFEN 600 MG PO TABS: 600.0000 mg | ORAL_TABLET | Freq: Four times a day (QID) | ORAL | 0 refills | Status: DC | PRN

## 2023-04-11 NOTE — Plan of Care (Signed)
Patient to be discharged home with printed instructions. Toya Smothers, RN

## 2023-04-11 NOTE — Progress Notes (Cosign Needed)
Patient arrived for injection of Abilify 400mg  that she brought from her pharmacy. No issues or complaints. Did state that she had a miscarriage recently. Affect and mood appropriate. Denies SI/HI or AV hallucinations. Injection given in Right Deltoid.

## 2023-04-11 NOTE — Discharge Summary (Signed)
Antenatal Physician Discharge Summary  Patient ID: Melissa [name] MRN: [id number] DOB/AGE: 1995/10/19 27 y.o.  Admit date: 04/10/2023 Discharge date: 04/11/2023  Admission Diagnoses: Spontaneous abortion in first trimester [O03.9]  Discharge Diagnoses:  As above Acute blood loss anemia  Prenatal Procedures: transfusion (1u pRBCs) Consults: none  Hospital Course:  Melissa [name] is a 27 y.o. [id number] who was admitted for blood transfusion after SAB.   Presented 8/14 with heavy vaginal bleeding and partial passage of pregnancy tissue. She received cytotec & pitocin in MAU and passed the remainder of POCs. She had symptomatic anemia with Hgb 12.4 > 9.6 over 4 hours so she was admitted for transfusion. She received 1u pRBCs with symptomatic improvement. Post-transfusion CBC 7.6. She denied ongoing bleeding, lightheadedness/dizziness, or fatigue. She declined a 2nd unit of blood and stated that she was feeling well. She declined contraception. She was discharged home in good condition and instructed to follow up with her Ob/Gyn within 2 weeks.   Of note, patient's NOB STI testing resulted with gonorrhea. She was treated with IM ceftriaxone 500mg  x1 prior to discharge.   Discharge Exam: Temp:  [98.4 F (36.9 C)-99.7 F (37.6 C)] 99.5 F (37.5 C) (08/15 0011) Pulse Rate:  [74-111] 86 (08/15 0011) Resp:  [16-18] 16 (08/15 0011) BP: (100-121)/(48-66) 100/55 (08/15 0011) SpO2:  [99 %-100 %] 100 % (08/15 0011) Physical Examination: CONSTITUTIONAL: Well-developed, well-nourished female in no acute distress.  NEUROLOGIC: Alert and oriented to person, place, and time. CARDIOVASCULAR: Normal heart rate noted RESPIRATORY: Effort normal, no problems with respiration noted ABDOMEN: Soft, nontender, nondistended  Significant Diagnostic Studies:  Results for orders placed or performed during the hospital encounter of 04/10/23 (from the past 168 hour(s))  CBC with Differential/Platelet    Collection Time: 04/10/23 10:03 AM  Result Value Ref Range   WBC 17.0 (H) 4.0 - 10.5 K/uL   RBC 2.90 (L) 3.87 - 5.11 MIL/uL   Hemoglobin 8.6 (L) 12.0 - 15.0 g/dL   HCT 13.0 (L) 86.5 - 78.4 %   MCV 86.6 80.0 - 100.0 fL   MCH 29.7 26.0 - 34.0 pg   MCHC 34.3 30.0 - 36.0 g/dL   RDW 69.6 29.5 - 28.4 %   Platelets 233 150 - 400 K/uL   nRBC 0.0 0.0 - 0.2 %   Neutrophils Relative % 85 %   Neutro Abs 14.5 (H) 1.7 - 7.7 K/uL   Lymphocytes Relative 8 %   Lymphs Abs 1.3 0.7 - 4.0 K/uL   Monocytes Relative 6 %   Monocytes Absolute 1.0 0.1 - 1.0 K/uL   Eosinophils Relative 0 %   Eosinophils Absolute 0.0 0.0 - 0.5 K/uL   Basophils Relative 0 %   Basophils Absolute 0.0 0.0 - 0.1 K/uL   Immature Granulocytes 1 %   Abs Immature Granulocytes 0.11 (H) 0.00 - 0.07 K/uL  Protime-INR   Collection Time: 04/10/23 10:03 AM  Result Value Ref Range   Prothrombin Time 17.6 (H) 11.4 - 15.2 seconds   INR 1.4 (H) 0.8 - 1.2  Type and screen [facility]   Collection Time: 04/10/23 10:03 AM  Result Value Ref Range   ABO/RH(D) B POS    Antibody Screen NEG    Sample Expiration 04/13/2023,2359    Unit Number [id number]    Blood Component Type RED CELLS,LR    Unit division 00    Status of Unit ALLOCATED    Transfusion Status OK TO TRANSFUSE    Crossmatch Result Compatible  Unit Number [id number]    Blood Component Type RBC LR PHER1    Unit division 00    Status of Unit ISSUED    Transfusion Status OK TO TRANSFUSE    Crossmatch Result      Compatible Performed at [facility], [address], [place], [place] [address]   [facility]   Collection Time: 04/10/23 10:03 AM  Result Value Ref Range   Blood Product Unit Number [id number]    PRODUCT CODE [id number]    Unit Type and Rh 7300    Blood Product Expiration Date 440347425956    ISSUE DATE / TIME [id number]    Blood Product Unit Number [id number]    PRODUCT CODE F0932T55    Unit Type and Rh 7300    Blood  Product Expiration Date 732202542706   Prepare RBC (crossmatch)   Collection Time: 04/10/23 10:20 AM  Result Value Ref Range   Order Confirmation      ORDER PROCESSED BY BLOOD BANK Performed at [facility], [address] [place], [place] [address]   CBC   Collection Time: 04/11/23  5:48 AM  Result Value Ref Range   WBC 14.0 (H) 4.0 - 10.5 K/uL   RBC 2.62 (L) 3.87 - 5.11 MIL/uL   Hemoglobin 7.6 (L) 12.0 - 15.0 g/dL   HCT 83.1 (L) 51.7 - 61.6 %   MCV 83.6 80.0 - 100.0 fL   MCH 29.0 26.0 - 34.0 pg   MCHC 34.7 30.0 - 36.0 g/dL   RDW 07.3 71.0 - 62.6 %   Platelets 189 150 - 400 K/uL   nRBC 0.0 0.0 - 0.2 %  Results for orders placed or performed during the hospital encounter of 04/09/23 (from the past 168 hour(s))  hCG, quantitative, pregnancy   Collection Time: 04/09/23 11:18 PM  Result Value Ref Range   hCG, Beta Chain, Quant, S 94,854 (H) <5 mIU/mL  CBC   Collection Time: 04/09/23 11:18 PM  Result Value Ref Range   WBC 11.9 (H) 4.0 - 10.5 K/uL   RBC 4.44 3.87 - 5.11 MIL/uL   Hemoglobin 12.4 12.0 - 15.0 g/dL   HCT 62.7 03.5 - 00.9 %   MCV 84.2 80.0 - 100.0 fL   MCH 27.9 26.0 - 34.0 pg   MCHC 33.2 30.0 - 36.0 g/dL   RDW 38.1 82.9 - 93.7 %   Platelets 320 150 - 400 K/uL   nRBC 0.0 0.0 - 0.2 %  Urinalysis, Routine w reflex microscopic -Urine, Clean Catch   Collection Time: 04/10/23 12:52 AM  Result Value Ref Range   Color, Urine YELLOW (A) YELLOW   APPearance HAZY (A) CLEAR   Specific Gravity, Urine 1.023 1.005 - 1.030   pH 5.0 5.0 - 8.0   Glucose, UA NEGATIVE NEGATIVE mg/dL   Hgb urine dipstick LARGE (A) NEGATIVE   Bilirubin Urine NEGATIVE NEGATIVE   Ketones, ur NEGATIVE NEGATIVE mg/dL   Protein, ur NEGATIVE NEGATIVE mg/dL   Nitrite NEGATIVE NEGATIVE   Leukocytes,Ua MODERATE (A) NEGATIVE   RBC / HPF 11-20 0 - 5 RBC/hpf   WBC, UA 21-50 0 - 5 WBC/hpf   Bacteria, UA RARE (A) NONE SEEN   Squamous Epithelial / HPF 6-10 0 - 5 /HPF   Mucus PRESENT   Results for  orders placed or performed in visit on 04/08/23 (from the past 168 hour(s))  Cervicovaginal ancillary only   Collection Time: 04/08/23  3:44 PM  Result Value Ref Range  Neisseria Gonorrhea Positive (A)    Chlamydia Negative    Trichomonas Positive (A)    Bacterial Vaginitis (gardnerella) Positive (A)    Candida Vaginitis Positive (A)    Candida Glabrata Negative    Comment      Normal Reference Range Bacterial Vaginosis - Negative   Comment Normal Reference Range Candida Species - Negative    Comment Normal Reference Range Candida Galbrata - Negative    Comment Normal Reference Range Trichomonas - Negative    Comment Normal Reference Ranger Chlamydia - Negative    Comment      Normal Reference Range Neisseria Gonorrhea - Negative  NOB Panel   Collection Time: 04/08/23  3:59 PM  Result Value Ref Range   Hepatitis B Surface Ag Negative Negative   HCV Ab Non Reactive Non Reactive   RPR Ser Ql Non Reactive Non Reactive   Rubella Antibodies, IGG 6.54 Immune >0.99 index   ABO Grouping B    Rh Factor Positive    Antibody Screen Negative Negative   Varicella zoster IgG 501 Immune >165 index   HIV Screen 4th Generation wRfx Non Reactive Non Reactive   WBC 8.2 3.4 - 10.8 x10E3/uL   RBC 4.07 3.77 - 5.28 x10E6/uL   Hemoglobin 11.4 11.1 - 15.9 g/dL   Hematocrit 60.6 30.1 - 46.6 %   MCV 84 79 - 97 fL   MCH 28.0 26.6 - 33.0 pg   MCHC 33.5 31.5 - 35.7 g/dL   RDW 60.1 09.3 - 23.5 %   Platelets 326 150 - 450 x10E3/uL   Neutrophils 63 Not Estab. %   Lymphs 28 Not Estab. %   Monocytes 7 Not Estab. %   Eos 2 Not Estab. %   Basos 0 Not Estab. %   Neutrophils Absolute 5.1 1.4 - 7.0 x10E3/uL   Lymphocytes Absolute 2.3 0.7 - 3.1 x10E3/uL   Monocytes Absolute 0.6 0.1 - 0.9 x10E3/uL   EOS (ABSOLUTE) 0.2 0.0 - 0.4 x10E3/uL   Basophils Absolute 0.0 0.0 - 0.2 x10E3/uL   Immature Granulocytes 0 Not Estab. %   Immature Grans (Abs) 0.0 0.0 - 0.1 x10E3/uL  Interpretation:   Collection Time:  04/08/23  3:59 PM  Result Value Ref Range   HCV Interp 1: Comment   Culture, OB Urine   Collection Time: 04/08/23  4:24 PM   Specimen: Urine, Voided   UR  Result Value Ref Range   Urine Culture, OB Final report   Urine Culture, OB Reflex   Collection Time: 04/08/23  4:24 PM  Result Value Ref Range   Organism ID, Bacteria Comment   Monitor Drug Profile 14(MW)   Collection Time: 04/08/23  4:26 PM  Result Value Ref Range   Amphetamine Scrn, Ur Negative Cutoff=1000 ng/mL   BARBITURATE SCREEN URINE Negative Cutoff=200 ng/mL   BENZODIAZEPINE SCREEN, URINE Negative Cutoff=200 ng/mL   CANNABINOIDS UR QL SCN Negative Cutoff=20 ng/mL   Cocaine (Metab) Scrn, Ur Negative Cutoff=300 ng/mL   Opiate Scrn, Ur Negative Cutoff=300 ng/mL   OXYCODONE+OXYMORPHONE UR QL SCN Negative Cutoff=100 ng/mL   Phencyclidine Qn, Ur Negative Cutoff=25 ng/mL   Methadone Screen, Urine Negative Cutoff=300 ng/mL   Propoxyphene Scrn, Ur Negative Cutoff=300 ng/mL   Meperidine Screen, Urine Negative Cutoff=200 ng/mL   Tramadol Screen, Urine Negative Cutoff=200 ng/mL   Fentanyl, Urine Negative Cutoff=2000 pg/mL   Buprenorphine, Urine Negative Cutoff=10 ng/mL   Creatinine(Crt), U 217.6 20.0 - 300.0 mg/dL   SPECIFIC GRAVITY 5.732    Ph of Urine 6.0  4.5 - 8.9   Please Note: Comment   Nicotine screen, urine   Collection Time: 04/08/23  4:26 PM  Result Value Ref Range   Cotinine Ql Scrn, Ur Negative Cutoff=300 ng/mL   Drug Screen Comment: Comment   Microscopic Examination   Collection Time: 04/08/23  4:28 PM  Result Value Ref Range   WBC, UA 11-30 (A) 0 - 5 /hpf   RBC, Urine None seen 0 - 2 /hpf   Epithelial Cells (non renal) >10 (A) 0 - 10 /hpf   Casts None seen None seen /lpf   Bacteria, UA Many (A) None seen/Few  Urinalysis, Routine w reflex microscopic   Collection Time: 04/08/23  4:28 PM  Result Value Ref Range   Specific Gravity, UA 1.025 1.005 - 1.030   pH, UA 6.0 5.0 - 7.5   Color, UA Yellow Yellow    Appearance Ur Cloudy (A) Clear   Leukocytes,UA 2+ (A) Negative   Protein,UA Trace Negative/Trace   Glucose, UA Negative Negative   Ketones, UA Negative Negative   RBC, UA Negative Negative   Bilirubin, UA Negative Negative   Urobilinogen, Ur 0.2 0.2 - 1.0 mg/dL   Nitrite, UA Negative Negative   Microscopic Examination See below:    US OB Comp Less 14 Wks  Result Date: 04/10/2023 CLINICAL DATA:  Pregnant, vaginal bleeding EXAM: OBSTETRIC <14 WK ULTRASOUND TECHNIQUE: Transabdominal ultrasound was performed for evaluation of the gestation as well as the maternal uterus and adnexal regions. COMPARISON:  02/21/2023 FINDINGS: Intrauterine gestational sac: Single Yolk sac:  Not Visualized. Embryo:  Visualized. Cardiac Activity: Visualized. Heart Rate: 158 bpm CRL:   78.1 mm   13 w 6 d                  [place] EDC: 10/10/2023 Subchorionic hemorrhage:  None visualized. Maternal uterus/adnexae: Bilateral ovaries are within normal limits. No free fluid. IMPRESSION: Single intrauterine gestation with cardiac activity, measuring 13 weeks 6 days by crown-rump length, as above. Electronically Signed   By: Charline Bills M.D.   On: 04/10/2023 00:52    Future Appointments  Date Time Provider Department Center  04/11/2023  9:30 AM [facility] NURSE [facility] None  05/06/2023  1:15 PM Tresea Mall, CNM [facility] None  05/14/2023  9:30 AM [facility] NURSE [facility] None  05/29/2023  2:30 PM [place], Shawn Route [facility] None  06/11/2023  9:30 AM [facility] NURSE [facility] None    Discharge Condition: Stable  Discharge disposition: 01-Home or Self Care       Discharge Instructions     Call MD for:   Complete by: As directed    Heavy vaginal bleeding (soaking more than 1 pad in an hour)   Call MD for:  persistant nausea and vomiting   Complete by: As directed    Call MD for:  severe uncontrolled pain   Complete by: As directed    Call MD for:  temperature >100.4   Complete by: As directed    Diet  - low sodium heart healthy   Complete by: As directed    Increase activity slowly   Complete by: As directed       Allergies as of 04/11/2023   No Known Allergies      Medication List     TAKE these medications    ARIPiprazole ER 400 MG Prsy prefilled syringe Commonly known as: ABILIFY MAINTENA Inject 400 mg into the muscle every 28 (twenty-eight) days.   ferrous sulfate 325 (65  FE) MG tablet Take 1 tablet (325 mg total) by mouth every other day. Notes to patient: Take every other day to help your body build back your red blood cells   ibuprofen 600 MG tablet Commonly known as: ADVIL Take 1 tablet (600 mg total) by mouth every 6 (six) hours as needed.   metroNIDAZOLE 500 MG tablet Commonly known as: FLAGYL Take 1 tablet (500 mg total) by mouth 2 (two) times daily.   nitrofurantoin (macrocrystal-monohydrate) 100 MG capsule Commonly known as: Macrobid Take 1 capsule (100 mg total) by mouth 2 (two) times daily for 7 days.   ondansetron 4 MG tablet Commonly known as: Zofran Take 1 tablet (4 mg total) by mouth every 8 (eight) hours as needed for nausea or vomiting.   prenatal multivitamin Tabs tablet Take 1 tablet by mouth daily at 12 noon.        Follow-up Information     [facility]. Schedule an appointment as soon as possible for a visit in 2 week(s).   Specialty: Obstetrics and Gynecology Why: Please call to schedule a hospital follow up appointment within 2 weeks Contact information: [address] [place] [address] [phone]                Total discharge time: 20 minutes   Signed: Lennart Pall M.D. 04/11/2023, 7:14 AM

## 2023-04-12 LAB — MATERNIT 21 PLUS CORE, BLOOD: Trisomy 21 (Down syndrome): NEGATIVE

## 2023-04-12 LAB — SURGICAL PATHOLOGY

## 2023-04-14 LAB — TYPE AND SCREEN
ABO/RH(D): B POS
Antibody Screen: NEGATIVE
Unit division: 0
Unit division: 0

## 2023-04-14 LAB — BPAM RBC
Blood Product Expiration Date: 202409012359
Blood Product Expiration Date: 202409032359
ISSUE DATE / TIME: 202408141510
Unit Type and Rh: 7300
Unit Type and Rh: 7300

## 2023-05-06 ENCOUNTER — Encounter: Payer: MEDICAID | Admitting: Advanced Practice Midwife

## 2023-05-14 ENCOUNTER — Ambulatory Visit (HOSPITAL_COMMUNITY): Payer: MEDICAID

## 2023-05-16 ENCOUNTER — Encounter (HOSPITAL_COMMUNITY): Payer: Self-pay

## 2023-05-16 ENCOUNTER — Ambulatory Visit (INDEPENDENT_AMBULATORY_CARE_PROVIDER_SITE_OTHER): Payer: MEDICAID

## 2023-05-16 VITALS — BP 108/64 | HR 72 | Ht 67.0 in | Wt 204.0 lb

## 2023-05-16 DIAGNOSIS — F250 Schizoaffective disorder, bipolar type: Secondary | ICD-10-CM | POA: Diagnosis not present

## 2023-05-16 MED ORDER — ARIPIPRAZOLE ER 400 MG IM PRSY
400.0000 mg | PREFILLED_SYRINGE | Freq: Once | INTRAMUSCULAR | Status: AC
Start: 2023-05-16 — End: 2023-06-11
  Administered 2023-06-11: 400 mg via INTRAMUSCULAR

## 2023-05-16 NOTE — Progress Notes (Cosign Needed)
Patient arrived monthly injection --- ARIPiprazole ER (ABILIFY MAINTENA) 400 MG  Tolerated injection well in Left Deltoid. Patient very pleasant  & friendly.  NO AH/VH   NOR  HI/SI.

## 2023-05-16 NOTE — Patient Instructions (Signed)
Patient arrived monthly injection --- ARIPiprazole ER (ABILIFY MAINTENA) 400 MG  Tolerated injection well in Left Deltoid. Patient very pleasant  & friendly.  NO AH/VH   NOR  HI/SI.
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
## 2023-05-29 ENCOUNTER — Telehealth (HOSPITAL_COMMUNITY): Payer: MEDICAID | Admitting: Psychiatry

## 2023-05-29 ENCOUNTER — Encounter (HOSPITAL_COMMUNITY): Payer: Self-pay | Admitting: Psychiatry

## 2023-05-29 DIAGNOSIS — F250 Schizoaffective disorder, bipolar type: Secondary | ICD-10-CM

## 2023-05-29 NOTE — Patient Instructions (Signed)
Thank you for attending your appointment today.  -- We did not make any medication changes today. Please continue medications as prescribed.  Please do not make any changes to medications without first discussing with your provider. If you are experiencing a psychiatric emergency, please call 911 or present to your nearest emergency department. Additional crisis, medication management, and therapy resources are included below.  [facility]  [address], [place], [place] [address] [phone] WALK-IN URGENT CARE 24/7 FOR ANYONE [address], [place], [place]  [phone] Fax: [phone] [email or web address] *Interpreters available *Accepts all insurance and uninsured for Urgent Care needs *Accepts Medicaid and uninsured for outpatient treatment (below)      ONLY FOR [place] Residents  Below:    Outpatient New Patient Assessment/Therapy Walk-ins:        Monday -Thursday 8am until slots are full.        Every Friday 1pm-4pm  (first come, first served)                   New Patient Psychiatry/Medication Management        Monday-Friday 8am-11am (first come, first served)               For all walk-ins we ask that you arrive by 7:15am, because patients will be seen in the order of arrival.   

## 2023-06-11 ENCOUNTER — Ambulatory Visit (INDEPENDENT_AMBULATORY_CARE_PROVIDER_SITE_OTHER): Payer: MEDICAID | Admitting: *Deleted

## 2023-06-11 ENCOUNTER — Encounter (HOSPITAL_COMMUNITY): Payer: Self-pay

## 2023-06-11 VITALS — BP 104/79 | HR 75 | Resp 16 | Ht 67.0 in | Wt 210.4 lb

## 2023-06-11 DIAGNOSIS — F250 Schizoaffective disorder, bipolar type: Secondary | ICD-10-CM

## 2023-06-11 NOTE — Progress Notes (Cosign Needed)
Patient arrived for her injection of Abilify Maintena 400mg  that she brought from her pharmacy. Given in her Right Deltoid without issue or complaint. States that the medication is working well, no side effects. Pleasant, cooperative, with appropriate affect. Will return in 28 days.

## 2023-07-09 ENCOUNTER — Ambulatory Visit (INDEPENDENT_AMBULATORY_CARE_PROVIDER_SITE_OTHER): Payer: MEDICAID

## 2023-07-09 ENCOUNTER — Ambulatory Visit (HOSPITAL_COMMUNITY): Payer: MEDICAID

## 2023-07-09 ENCOUNTER — Encounter (HOSPITAL_COMMUNITY): Payer: Self-pay

## 2023-07-09 VITALS — BP 130/75 | HR 99 | Ht 67.0 in | Wt 234.8 lb

## 2023-07-09 DIAGNOSIS — F200 Paranoid schizophrenia: Secondary | ICD-10-CM

## 2023-07-09 DIAGNOSIS — G4700 Insomnia, unspecified: Secondary | ICD-10-CM | POA: Diagnosis not present

## 2023-07-09 DIAGNOSIS — F411 Generalized anxiety disorder: Secondary | ICD-10-CM | POA: Diagnosis not present

## 2023-07-09 MED ORDER — ARIPIPRAZOLE ER 400 MG IM PRSY
400.0000 mg | PREFILLED_SYRINGE | Freq: Once | INTRAMUSCULAR | Status: AC
Start: 2023-07-09 — End: 2023-07-09
  Administered 2023-07-09: 400 mg via INTRAMUSCULAR

## 2023-07-09 NOTE — Progress Notes (Cosign Needed)
Patient arrived for injection of Abilify Maintena 400mg  given in LEFT Deltoid. No side effects noted. No issues or complaints.

## 2023-07-19 ENCOUNTER — Emergency Department (HOSPITAL_COMMUNITY): Payer: MEDICAID

## 2023-07-19 ENCOUNTER — Encounter (HOSPITAL_COMMUNITY): Payer: Self-pay

## 2023-07-19 ENCOUNTER — Other Ambulatory Visit (HOSPITAL_COMMUNITY): Payer: Self-pay

## 2023-07-19 ENCOUNTER — Emergency Department (HOSPITAL_COMMUNITY)
Admission: EM | Admit: 2023-07-19 | Discharge: 2023-07-19 | Disposition: A | Discharge status: Home / Self Care | Payer: MEDICAID | Attending: Emergency Medicine | Admitting: Emergency Medicine

## 2023-07-19 ENCOUNTER — Other Ambulatory Visit: Payer: Self-pay

## 2023-07-19 DIAGNOSIS — N939 Abnormal uterine and vaginal bleeding, unspecified: Secondary | ICD-10-CM | POA: Diagnosis present

## 2023-07-19 LAB — URINALYSIS, ROUTINE W REFLEX MICROSCOPIC
Bacteria, UA: NONE SEEN
Bilirubin Urine: NEGATIVE
Glucose, UA: NEGATIVE mg/dL
Ketones, ur: NEGATIVE mg/dL
Nitrite: NEGATIVE
Protein, ur: NEGATIVE mg/dL
RBC / HPF: >50 RBC/hpf (ref 0–5)
Specific Gravity, Urine: 1.024 (ref 1.005–1.030)
pH: 5 (ref 5.0–8.0)

## 2023-07-19 LAB — CBC
HCT: 34.1 % — ABNORMAL LOW (ref 36.0–46.0)
Hemoglobin: 10.7 g/dL — ABNORMAL LOW (ref 12.0–15.0)
MCH: 24.6 pg — ABNORMAL LOW (ref 26.0–34.0)
MCHC: 31.4 g/dL (ref 30.0–36.0)
MCV: 78.4 fL — ABNORMAL LOW (ref 80.0–100.0)
Platelets: 405 10*3/uL — ABNORMAL HIGH (ref 150–400)
RBC: 4.35 MIL/uL (ref 3.87–5.11)
RDW: 14 % (ref 11.5–15.5)
WBC: 6.2 10*3/uL (ref 4.0–10.5)
nRBC: 0 % (ref 0.0–0.2)

## 2023-07-19 LAB — COMPREHENSIVE METABOLIC PANEL
ALT: 15 U/L (ref 0–44)
AST: 25 U/L (ref 15–41)
Albumin: 3.6 g/dL (ref 3.5–5.0)
Alkaline Phosphatase: 72 U/L (ref 38–126)
Anion gap: 9 (ref 5–15)
BUN: 7 mg/dL (ref 6–20)
CO2: 24 mmol/L (ref 22–32)
Calcium: 9.4 mg/dL (ref 8.9–10.3)
Chloride: 107 mmol/L (ref 98–111)
Creatinine, Ser: 1.05 mg/dL — ABNORMAL HIGH (ref 0.44–1.00)
GFR, Estimated: >60 mL/min (ref >60)
Glucose, Bld: 90 mg/dL (ref 70–99)
Potassium: 3.4 mmol/L — ABNORMAL LOW (ref 3.5–5.1)
Sodium: 140 mmol/L (ref 135–145)
Total Bilirubin: 0.6 mg/dL (ref <1.2)
Total Protein: 7.7 g/dL (ref 6.5–8.1)

## 2023-07-19 LAB — HCG, QUANTITATIVE, PREGNANCY: hCG, Beta Chain, Quant, S: 2 m[IU]/mL (ref <5)

## 2023-07-19 LAB — LIPASE, BLOOD: Lipase: 29 U/L (ref 11–51)

## 2023-07-19 MED ORDER — MEGESTROL ACETATE 40 MG PO TABS
40.0000 mg | ORAL_TABLET | Freq: Two times a day (BID) | ORAL | 0 refills | Status: DC
  Filled 2023-07-19: qty 100, 50d supply, fill #0

## 2023-07-19 MED ORDER — MEGESTROL ACETATE 40 MG PO TABS
40.0000 mg | ORAL_TABLET | Freq: Two times a day (BID) | ORAL | 0 refills | Status: AC
  Filled 2023-07-19 – 2023-08-05 (×2): qty 14, 7d supply, fill #0

## 2023-07-19 NOTE — ED Provider Notes (Signed)
[place] EMERGENCY DEPARTMENT AT [facility] Provider Note   CSN: [id number] Arrival date & time: 07/19/23  1159     History  Chief Complaint  Patient presents with   Vaginal Bleeding    Melissa [name] is a 27 y.o. female.  27 year old female G4, P2 with a miscarriage on 04/10/2023 who presents to the emergency department with vaginal bleeding.  Patient reports that since the miscarriage in August she has had persistent heavy menstrual cycles.  Says that they typically last 3 weeks at a time and then she has a week of no bleeding and then her cycle starts again.  Says that the bleeding is light and that she is not going through multiple pads per day.  Has a little bit of dizziness but no chest pain or shortness of breath.  Not on blood thinners.  Does not smoke.  Not on OCPs.  No history of DVT/PE.  No concern for STIs at this time.       Home Medications Prior to Admission medications   Medication Sig Start Date End Date Taking? Authorizing Provider  ARIPiprazole ER (ABILIFY MAINTENA) 400 MG PRSY prefilled syringe Inject 400 mg into the muscle every 28 (twenty-eight) days. 02/19/23  Yes [place], Melissa [name] A  ferrous sulfate 325 (65 FE) MG tablet Take 1 tablet (325 mg total) by mouth every other day. Patient taking differently: Take 325 mg by mouth daily as needed (iron supplement). 04/11/23  Yes Lennart Pall, MD  ibuprofen (ADVIL) 600 MG tablet Take 1 tablet (600 mg total) by mouth every 6 (six) hours as needed. 04/11/23  Yes Lennart Pall, MD  megestrol (MEGACE) 40 MG tablet Take 1 tablet (40 mg total) by mouth 2 (two) times daily for 7 days. 07/19/23 07/26/23  Rondel Baton, MD      Allergies    Patient has no known allergies.    Review of Systems   Review of Systems  Physical Exam Updated Vital Signs BP (!) 92/56   Pulse 84   Temp 98.2 F (36.8 C)   Resp 18   Ht 5\' 7"  (1.702 m)   Wt 95.3 kg   LMP 07/19/2023   SpO2 100%   BMI 32.89 kg/m   Physical Exam Vitals and nursing note reviewed.  Constitutional:      General: She is not in acute distress.    Appearance: She is well-developed.  HENT:     Head: Normocephalic and atraumatic.     Right Ear: External ear normal.     Left Ear: External ear normal.     Nose: Nose normal.  Eyes:     Extraocular Movements: Extraocular movements intact.     Conjunctiva/sclera: Conjunctivae normal.     Pupils: Pupils are equal, round, and reactive to light.  Cardiovascular:     Rate and Rhythm: Normal rate and regular rhythm.     Heart sounds: No murmur heard. Pulmonary:     Effort: Pulmonary effort is normal. No respiratory distress.     Breath sounds: Normal breath sounds.  Genitourinary:    Comments: Chaperoned by tech Corrie Dandy.  External genitalia unremarkable. Nor rashes or lesions noted.  Speculum exam with normal appearing whitish vaginal discharge.  Vaginal wall mucosa is unremarkable.  Cervix visualized and is unremarkable (closed in appearance without any protruding material).  Bimanual exam without cervical motion tenderness, adnexal tenderness or any masses appreciated. Musculoskeletal:     Cervical back: Normal range of motion and neck supple.  Right lower leg: No edema.     Left lower leg: No edema.  Skin:    General: Skin is warm and dry.  Neurological:     Mental Status: She is alert and oriented to person, place, and time. Mental status is at baseline.  Psychiatric:        Mood and Affect: Mood normal.     ED Results / Procedures / Treatments   Labs (all labs ordered are listed, but only abnormal results are displayed) Labs Reviewed  COMPREHENSIVE METABOLIC PANEL - Abnormal; Notable for the following components:      Result Value   Potassium 3.4 (*)    Creatinine, Ser 1.05 (*)    All other components within normal limits  CBC - Abnormal; Notable for the following components:   Hemoglobin 10.7 (*)    HCT 34.1 (*)    MCV 78.4 (*)    MCH 24.6 (*)     Platelets 405 (*)    All other components within normal limits  URINALYSIS, ROUTINE W REFLEX MICROSCOPIC - Abnormal; Notable for the following components:   APPearance HAZY (*)    Hgb urine dipstick LARGE (*)    Leukocytes,Ua MODERATE (*)    All other components within normal limits  LIPASE, BLOOD  HCG, QUANTITATIVE, PREGNANCY  RPR  HIV ANTIBODY (ROUTINE TESTING W REFLEX)  GC/CHLAMYDIA PROBE AMP ([place]) NOT AT [facility]    EKG None  Radiology US Pelvis Complete  Result Date: 07/19/2023 CLINICAL DATA:  vaginal bleeding EXAM: TRANSABDOMINAL ULTRASOUND OF PELVIS TECHNIQUE: Transabdominal ultrasound examination of the pelvis was performed including evaluation of the uterus, ovaries, adnexal regions, and pelvic cul-de-sac. COMPARISON:  None Available. FINDINGS: Uterus Measurements: 3.1 x 5.3 x 8.9 cm. = volume: 77.4 mL. No fibroids or other mass visualized. Endometrium Thickness: 7.7 mm.  No focal abnormality visualized. Right ovary Measurements: 1.9 x 2.1 x 2.8 cm. = volume: 5.7 mL. Normal appearance/no adnexal mass. Left ovary Measurements: 0.4 x 2.9 x 3.0 cm. = volume: 11.0 mL. Normal appearance/no adnexal mass. Other findings:  No abnormal free fluid. IMPRESSION: *Unremarkable transabdominal ultrasound of the pelvis. Electronically Signed   By: Jules Schick M.D.   On: 07/19/2023 19:31    Procedures Procedures    Medications Ordered in ED Medications - No data to display  ED Course/ Medical Decision Making/ A&P Clinical Course as of 07/19/23 2257  Fri Jul 19, 2023  1505 Hemoglobin(!): 10.7 Most recently 7.6 [RP]  1735 Signed out to Dr Estell Harpin [RP]    Clinical Course User Index [RP] Rondel Baton, MD                                 Medical Decision Making Amount and/or Complexity of Data Reviewed Labs: ordered. Decision-making details documented in ED Course. Radiology: ordered.  Risk Prescription drug management.   Melissa [name] is a 27 y.o. female with  comorbidities that complicate the patient evaluation including G4, P2 with a miscarriage on 04/10/2023 who presents to the emergency department with vaginal bleeding.   Initial Ddx:  Symptomatic anemia, abnormal uterine bleeding, fibroids, endometrial hyperplasia, retained products of conception  MDM/Course:  Patient presents to the emergency department with several months of vaginal bleeding.  This appears to have occurred after she had a procedure for miscarriage.  Has some dizziness but no other symptoms of anemia.  Appears that the bleeding has been very light and she  is not soaking more than a pad a day.  On exam has minimal bleeding in the vaginal vault.  CBC showed a hemoglobin of 10.7 which is improved from prior.  Ultrasound was ordered and we are awaiting the results at this time.  If negative feel that she can follow-up with OB/GYN and take Megace since she does not have an increased risk of blood clots.  Signed out to the oncoming physician awaiting her ultrasound results  This patient presents to the ED for concern of complaints listed in HPI, this involves an extensive number of treatment options, and is a complaint that carries with it a high risk of complications and morbidity. Disposition including potential need for admission considered.   Dispo: DC Home. Return precautions discussed including, but not limited to, those listed in the AVS. Allowed pt time to ask questions which were answered fully prior to dc.  Records reviewed Outpatient Clinic Notes The following labs were independently interpreted: CBC and show chronic anemia I personally reviewed and interpreted cardiac monitoring: normal sinus rhythm  I personally reviewed and interpreted the pt's EKG: see above for interpretation  I have reviewed the patients home medications and made adjustments as needed  Portions of this note were generated with Dragon dictation software. Dictation errors may occur despite best attempts at  proofreading.           Final Clinical Impression(s) / ED Diagnoses Final diagnoses:  Vaginal bleeding    Rx / DC Orders ED Discharge Orders          Ordered    megestrol (MEGACE) 40 MG tablet  2 times daily,   Status:  Discontinued        07/19/23 1700    megestrol (MEGACE) 40 MG tablet  2 times daily        07/19/23 1701              Rondel Baton, MD 07/19/23 2257

## 2023-07-19 NOTE — ED Notes (Signed)
The pt reports that she is ready to go home

## 2023-07-19 NOTE — ED Provider Triage Note (Signed)
Emergency Medicine Provider Triage Evaluation Note  Melissa [name] , a 27 y.o. female  was evaluated in triage.  Pt complains of vaginal bleeding.  Started 2 weeks ago.  It was profuse initially but is only spotting now.  Also endorses puslike vaginal discharge has been going for 3 months.  Denies abdominal or pelvic pain.  Denies fever.  Review of Systems  Positive: See above Negative: See above  Physical Exam  BP (!) 110/94 (BP Location: Right Arm)   Pulse 88   Temp 98.1 F (36.7 C) (Oral)   Resp 16   Ht 5\' 7"  (1.702 m)   Wt 95.3 kg   LMP 07/19/2023   SpO2 99%   BMI 32.89 kg/m  Gen:   Awake, no distress   Resp:  Normal effort  MSK:   Moves extremities without difficulty  Other:    Medical Decision Making  Medically screening exam initiated at 12:21 PM.  Appropriate orders placed.  KAREE WARNICK was informed that the remainder of the evaluation will be completed by another provider, this initial triage assessment does not replace that evaluation, and the importance of remaining in the ED until their evaluation is complete.  Work up started   Gareth Eagle, PA-C 07/19/23 1222

## 2023-07-19 NOTE — ED Notes (Signed)
Admitting doctor at the bedside

## 2023-07-19 NOTE — ED Triage Notes (Signed)
Pt states that since her miscarriage in August her period has lasted longer and longer and she thinks she may be pregnant. Pt states that sometimes her stomach hurts and she has yellow vaginal discharge. Pt denies fevers. Pt denies nausea, vomiting, diarrhea or constipation.

## 2023-07-19 NOTE — Discharge Instructions (Signed)
You were seen for your vaginal bleeding in the emergency department.   At home, please take Tylenol and ibuprofen for any cramping that you have.  Take the Megace we have prescribed you until you follow-up with your OB/GYN.  Check your MyChart online for the results of any tests that had not resulted by the time you left the emergency department.   Follow-up with your OB/GYN in 2-3 days regarding your visit.    Return immediately to the emergency department if you experience any of the following: Soaking through more than 2 pads per hour for 2 hours (4 pads total and 2 hours), or any other concerning symptoms.    Thank you for visiting our Emergency Department. It was a pleasure taking care of you today.

## 2023-07-20 LAB — RPR: RPR Ser Ql: NONREACTIVE

## 2023-07-22 ENCOUNTER — Telehealth (HOSPITAL_COMMUNITY): Payer: Self-pay

## 2023-07-22 LAB — GC/CHLAMYDIA PROBE AMP (~~LOC~~) NOT AT ARMC
Chlamydia: POSITIVE — AB
Comment: NEGATIVE
Comment: NORMAL
Neisseria Gonorrhea: NEGATIVE

## 2023-07-22 MED ORDER — DOXYCYCLINE HYCLATE 100 MG PO CAPS: 100.0000 mg | ORAL_CAPSULE | Freq: Two times a day (BID) | ORAL | 0 refills | Status: AC

## 2023-08-05 ENCOUNTER — Other Ambulatory Visit (HOSPITAL_COMMUNITY): Payer: Self-pay

## 2023-08-05 NOTE — Progress Notes (Unsigned)
Patient did not connect for virtual psychiatric medication management appointment on 08/06/23 at 2:30PM. Sent secure video link with no response. Called phone with no answer; left VM with callback number to reschedule. Of note, this appointment had been switched from in person to virtual appointment after patient requested this change due to transportation issues.   Daine Gip, MD 08/06/23

## 2023-08-06 ENCOUNTER — Encounter (HOSPITAL_COMMUNITY): Payer: MEDICAID | Admitting: Psychiatry

## 2023-08-06 ENCOUNTER — Encounter (HOSPITAL_COMMUNITY): Payer: Self-pay

## 2023-08-06 ENCOUNTER — Ambulatory Visit (INDEPENDENT_AMBULATORY_CARE_PROVIDER_SITE_OTHER): Payer: MEDICAID

## 2023-08-06 VITALS — BP 105/70 | HR 86 | Wt 205.6 lb

## 2023-08-06 DIAGNOSIS — F200 Paranoid schizophrenia: Secondary | ICD-10-CM

## 2023-08-06 DIAGNOSIS — G4700 Insomnia, unspecified: Secondary | ICD-10-CM

## 2023-08-06 DIAGNOSIS — F411 Generalized anxiety disorder: Secondary | ICD-10-CM

## 2023-08-06 MED ORDER — ARIPIPRAZOLE ER 400 MG IM SRER
400.0000 mg | Freq: Once | INTRAMUSCULAR | Status: AC
  Administered 2023-08-06: 400 mg via INTRAMUSCULAR

## 2023-08-06 NOTE — Progress Notes (Cosign Needed)
Patient arrived for injection of Abilify Maintena 400mg  given in LEFT Deltoid. No side effects noted. No issues or complaints.

## 2023-09-04 ENCOUNTER — Ambulatory Visit (HOSPITAL_COMMUNITY): Payer: MEDICAID

## 2023-09-11 ENCOUNTER — Encounter (HOSPITAL_COMMUNITY): Payer: Self-pay

## 2023-09-11 ENCOUNTER — Ambulatory Visit (HOSPITAL_COMMUNITY): Payer: MEDICAID

## 2023-09-11 VITALS — BP 122/77 | HR 97 | Resp 15 | Ht 67.0 in | Wt 204.0 lb

## 2023-09-11 DIAGNOSIS — F200 Paranoid schizophrenia: Secondary | ICD-10-CM

## 2023-09-11 MED ORDER — ARIPIPRAZOLE ER 400 MG IM PRSY
400.0000 mg | PREFILLED_SYRINGE | Freq: Once | INTRAMUSCULAR | Status: AC
  Administered 2023-09-11: 400 mg via INTRAMUSCULAR

## 2023-09-11 NOTE — Progress Notes (Cosign Needed)
 Pt presents today for injection of ABILIFY  MAINTENA 400 MG. Pt tolerated injection well with no complaints in right deltiod. Pt mentioned that she went to see her kids back in her home town and had a pleasant time with them. She also states that in the future she hopes to get off of her medication.
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
## 2023-09-24 ENCOUNTER — Encounter (HOSPITAL_COMMUNITY): Payer: Self-pay | Admitting: Psychiatry

## 2023-09-24 ENCOUNTER — Telehealth (HOSPITAL_COMMUNITY): Payer: MEDICAID | Admitting: Psychiatry

## 2023-09-24 DIAGNOSIS — Z5181 Encounter for therapeutic drug level monitoring: Secondary | ICD-10-CM | POA: Diagnosis not present

## 2023-09-24 DIAGNOSIS — F250 Schizoaffective disorder, bipolar type: Secondary | ICD-10-CM

## 2023-09-24 DIAGNOSIS — Z79899 Other long term (current) drug therapy: Secondary | ICD-10-CM | POA: Diagnosis not present

## 2023-09-24 DIAGNOSIS — F251 Schizoaffective disorder, depressive type: Secondary | ICD-10-CM

## 2023-09-24 MED ORDER — ABILIFY MAINTENA 300 MG IM PRSY: 300.0000 mg | PREFILLED_SYRINGE | INTRAMUSCULAR | 11 refills | Status: DC

## 2023-09-24 NOTE — Addendum Note (Signed)
Addended by: Theodoro Kos A on: 09/24/2023 04:14 PM   Modules accepted: Orders

## 2023-09-24 NOTE — Patient Instructions (Signed)
Thank you for attending your appointment today.  -- As discussed, we will decrease the dose of your injection to 300 mg monthly. This has been sent in to your pharamcy. -- Continue other medications as prescribed.  Please do not make any changes to medications without first discussing with your provider. If you are experiencing a psychiatric emergency, please call 911 or present to your nearest emergency department. Additional crisis, medication management, and therapy resources are included below.  [facility]  [address], [place], [place] [address] [phone] WALK-IN URGENT CARE 24/7 FOR ANYONE [address], [place], [place]  [phone] Fax: [phone] [email or web address] *Interpreters available *Accepts all insurance and uninsured for Urgent Care needs *Accepts Medicaid and uninsured for outpatient treatment (below)      ONLY FOR [facility]  Below:    Outpatient New Patient Assessment/Therapy Walk-ins:        Monday, Wednesday, and Thursday 8am until slots are full (first come, first served)                   New Patient Psychiatry/Medication Management        Monday-Friday 8am-11am (first come, first served)               For all walk-ins we ask that you arrive by 7:15am, because patients will be seen in the order of arrival.

## 2023-10-08 ENCOUNTER — Ambulatory Visit (HOSPITAL_COMMUNITY): Payer: MEDICAID

## 2023-10-09 ENCOUNTER — Encounter (HOSPITAL_COMMUNITY): Payer: Self-pay

## 2023-10-09 ENCOUNTER — Ambulatory Visit (INDEPENDENT_AMBULATORY_CARE_PROVIDER_SITE_OTHER): Payer: MEDICAID

## 2023-10-09 VITALS — BP 106/72 | HR 87 | Wt 204.6 lb

## 2023-10-09 DIAGNOSIS — F411 Generalized anxiety disorder: Secondary | ICD-10-CM

## 2023-10-09 DIAGNOSIS — G4700 Insomnia, unspecified: Secondary | ICD-10-CM

## 2023-10-09 DIAGNOSIS — F200 Paranoid schizophrenia: Secondary | ICD-10-CM

## 2023-10-09 MED ORDER — ARIPIPRAZOLE ER 300 MG IM PRSY
300.0000 mg | PREFILLED_SYRINGE | Freq: Once | INTRAMUSCULAR | Status: AC
  Administered 2023-10-09: 300 mg via INTRAMUSCULAR

## 2023-10-09 NOTE — Progress Notes (Cosign Needed)
Patient arrived for injection of Abilify Maintena 400mg  given in RIGHT Deltoid. No side effects noted. No issues or complaints.

## 2023-11-11 ENCOUNTER — Encounter (HOSPITAL_COMMUNITY): Payer: Self-pay

## 2023-11-11 ENCOUNTER — Ambulatory Visit (INDEPENDENT_AMBULATORY_CARE_PROVIDER_SITE_OTHER): Payer: MEDICAID

## 2023-11-11 VITALS — BP 107/64 | HR 75 | Wt 206.2 lb

## 2023-11-11 DIAGNOSIS — G4700 Insomnia, unspecified: Secondary | ICD-10-CM

## 2023-11-11 DIAGNOSIS — F411 Generalized anxiety disorder: Secondary | ICD-10-CM | POA: Diagnosis not present

## 2023-11-11 DIAGNOSIS — F200 Paranoid schizophrenia: Secondary | ICD-10-CM

## 2023-11-11 MED ORDER — ARIPIPRAZOLE ER 300 MG IM PRSY
300.0000 mg | PREFILLED_SYRINGE | Freq: Once | INTRAMUSCULAR | Status: AC
  Administered 2023-11-11: 300 mg via INTRAMUSCULAR

## 2023-11-11 NOTE — Progress Notes (Cosign Needed)
 Patient arrived for injection of Abilify Maintena 400mg  given in RIGHT Deltoid. No side effects noted. No issues or complaints.
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
## 2023-11-12 ENCOUNTER — Encounter (HOSPITAL_COMMUNITY): Payer: MEDICAID | Admitting: Psychiatry

## 2023-11-19 ENCOUNTER — Telehealth (HOSPITAL_COMMUNITY): Payer: Self-pay

## 2023-11-19 NOTE — Telephone Encounter (Signed)
 Patient moved to [place]. Patient told this myself & nursing staff 10/09/23. Patient then called to make an appointment for 11/11/23 for injection patient was given injection & advised that she needs to establish care there. Patient agreed however patient called today asking to be scheduled for injection on 12/11/23, Per Rosie this appointment has been made. I did let patient know that this would be her last visit here. Patient said "ok,Thank you".   Thanks!

## 2023-12-11 ENCOUNTER — Encounter (HOSPITAL_COMMUNITY): Payer: Self-pay

## 2023-12-11 ENCOUNTER — Ambulatory Visit (INDEPENDENT_AMBULATORY_CARE_PROVIDER_SITE_OTHER): Payer: MEDICAID | Admitting: Family

## 2023-12-11 VITALS — BP 105/76 | HR 41 | Wt 205.4 lb

## 2023-12-11 DIAGNOSIS — F411 Generalized anxiety disorder: Secondary | ICD-10-CM | POA: Diagnosis not present

## 2023-12-11 DIAGNOSIS — F419 Anxiety disorder, unspecified: Secondary | ICD-10-CM | POA: Diagnosis not present

## 2023-12-11 DIAGNOSIS — F251 Schizoaffective disorder, depressive type: Secondary | ICD-10-CM | POA: Diagnosis not present

## 2023-12-11 DIAGNOSIS — G4700 Insomnia, unspecified: Secondary | ICD-10-CM | POA: Diagnosis not present

## 2023-12-11 DIAGNOSIS — F200 Paranoid schizophrenia: Secondary | ICD-10-CM

## 2023-12-11 MED ORDER — ARIPIPRAZOLE ER 300 MG IM PRSY
300.0000 mg | PREFILLED_SYRINGE | Freq: Once | INTRAMUSCULAR | Status: AC
  Administered 2023-12-11: 300 mg via INTRAMUSCULAR

## 2023-12-11 NOTE — Progress Notes (Signed)
 Patient arrived for injection of Abilify Maintena 300 mg given in left Deltoid. No side effects noted. No issues or complaints.

## 2023-12-11 NOTE — Progress Notes (Unsigned)
 BH MD/PA/NP OP Progress Note  12/12/2023 9:07 AM Melissa [name]  MRN:  [id number]  Chief Complaint: Injection clinic  HPI: Melissa [name] 28 year old African-American female presents for monthly injection.  Patient has a diagnosis related to schizoaffective disorder bipolar type, major depressive disorder and generalized anxiety disorder.  Currently prescribed Abilify 300 mg she denied she denied concerns related to suicidal or homicidal ideations.  Denied auditory or visual hallucinations.  Reports she has been taking monthly injectable as directed for the past 2 years.  Medication side effects.  Patient was recently decreased from Abilify 400 mg to Abilify 300 mg, this is her first dose with Abilify 300 mg.   She reports she has not had time to follow-up with metabolic panel as she states she has been going from her mother/grandmother's house.  This provider inquired about illicit drug use to substance abuse. Melissa had a momentary pause then stated " no not really, I had to process the question."  Reports a good appetite.  States she is resting well throughout the night. States she has not been able to apply for jobs due to increased social anxiety. During evaluation Melissa [name] is sitting with blunt affect. Stated that she resides in [place] however has plans to move back to [place] to reside with her father.  She reports plans to continue care through the [name] urgent care facility.    She is alert/oriented x 4; calm/cooperative; and mood congruent with affect.  Patient is speaking in a clear tone at moderate volume, and normal pace; with good eye contact.  Her thought process is coherent and relevant; There is no indication that she is currently responding to internal/external stimuli or experiencing delusional thought content.  Patient denies suicidal/self-harm/homicidal ideation, psychosis, and paranoia.  Patient has remained calm throughout assessment and has answered  questions appropriately.   Visit Diagnosis:    ICD-10-CM   1. Schizoaffective disorder, depressive type (HCC)  F25.1     2. 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 06/27/2022    Past Surgical History:  Procedure Laterality Date   chest tube  06/11/2020    Family Psychiatric History:   Family History:  Family History  Problem Relation Age of Onset   Asthma Mother    Hypertension Father    Healthy Brother    Healthy Maternal Grandmother    Healthy Maternal Grandfather    Healthy Paternal Grandmother    Healthy Paternal Grandfather    Cancer Neg Hx    Diabetes Neg Hx    Heart disease Neg Hx    Stroke Neg Hx     Social History:  Social History   Socioeconomic History   Marital status: Single    Spouse name: Not on file   Number of children: 2   Years of education: 12   Highest education level: Some college, no degree  Occupational History   Occupation: unemployed  Tobacco Use   Smoking status: Never   Smokeless tobacco: Never  Vaping Use   Vaping status: Never Used  Substance and Sexual Activity   Alcohol use: Yes    Comment: "a few sips" every few weeks   Drug use: Yes    Types: Marijuana    Comment: once weekly   Sexual activity: Yes    Partners: Male    Birth control/protection: None  Other Topics Concern   Not on file  Social History Narrative   Not on file   Social Drivers of Health   Financial Resource Strain: Low Risk  (02/15/2023)   Overall Financial Resource Strain  (CARDIA)    Difficulty of Paying Living Expenses: Not very hard  Food Insecurity: No Food Insecurity (04/10/2023)   Hunger Vital Sign    Worried About Running Out of Food in the Last Year: Never true    Ran Out of Food in the Last Year: Never true  Transportation Needs: No Transportation Needs (04/10/2023)   PRAPARE - Administrator, Civil Service (Medical): No    Lack of Transportation (Non-Medical): No  Physical Activity: Inactive (02/15/2023)   Exercise Vital Sign    Days of Exercise per Week: 0 days    Minutes of Exercise per Session: 0 min  Stress: No Stress Concern Present (02/15/2023)   [employer] of Occupational Health - Occupational Stress Questionnaire    Feeling of Stress : Not at all  Social Connections: Unknown (02/15/2023)   Social Connection and Isolation Panel [NHANES]    Frequency of Communication with Friends and Family: More than three times a week    Frequency of Social Gatherings with Friends and Family: More than three times a week    Attends Religious Services: Never    Database administrator or Organizations: No    Attends Banker Meetings: Never    Marital Status: Not on file  Recent Concern: Social Connections - Socially Isolated (02/15/2023)   Social Connection and Isolation Panel [NHANES]    Frequency of Communication with Friends and Family: More than three times a week    Frequency of Social Gatherings with Friends and Family: More than three times a week    Attends Religious Services: Never    Database administrator or Organizations: No    Attends Engineer, structural: Never    Marital Status: Never married    Allergies: No Known Allergies  Metabolic Disorder Labs: Lab Results  Component Value Date   HGBA1C 4.8 07/22/2016   MPG 91 07/22/2016   Lab Results  Component Value Date   PROLACTIN 6.6 07/22/2016   Lab Results  Component Value Date   CHOL 126 07/22/2016   TRIG 49 07/22/2016   HDL 47 07/22/2016    CHOLHDL 2.7 07/22/2016   VLDL 10 07/22/2016   LDLCALC 69 07/22/2016   Lab Results  Component Value Date   TSH 0.794 07/27/2016   TSH 2.139 10/21/2015    Therapeutic Level Labs: No results found for: "LITHIUM" No results found for: "VALPROATE" No results found for: "CBMZ"  Current Medications: Current Outpatient Medications  Medication Sig Dispense Refill   ARIPiprazole ER (ABILIFY MAINTENA) 300 MG PRSY prefilled syringe Inject 300 mg into the muscle every 28 (twenty-eight) days. 1 each 11   ferrous sulfate 325 (65 FE) MG tablet Take 1 tablet (325 mg total) by mouth every other day. (Patient not taking: Reported on 09/24/2023) 90 tablet 1   ibuprofen (ADVIL)  600 MG tablet Take 1 tablet (600 mg total) by mouth every 6 (six) hours as needed. (Patient not taking: Reported on 09/24/2023) 60 tablet 0   No current facility-administered medications for this visit.     Musculoskeletal: Strength & Muscle Tone: within normal limits Gait & Station: normal Patient leans: N/A  Psychiatric Specialty Exam: Review of Systems  unknown if currently breastfeeding.There is no height or weight on file to calculate BMI.  General Appearance: Casual  Eye Contact:  Good  Speech:  Clear and Coherent  Volume:  Normal  Mood:  Anxious and Depressed  Affect:  Congruent  Thought Process:  Coherent  Orientation:  Full (Time, Place, and Person)  Thought Content: Logical   Suicidal Thoughts:  No  Homicidal Thoughts:  No  Memory:  Immediate;   Good Recent;   Good  Judgement:  Good  Insight:  Good  Psychomotor Activity:  Normal  Concentration:  Concentration: Good  Recall:  Good  Fund of Knowledge: Good  Language: Good  Akathisia:  No  Handed:  Right  AIMS (if indicated): not done  Assets:  Communication Skills Desire for Improvement  ADL's:  Intact  Cognition: WNL  Sleep:  Good   Screenings: AIMS    Flowsheet Row Admission (Discharged) [facility] 07/19/2016 in [facility] INPATIENT BEHAVIORAL  MEDICINE Admission ([facility]charged) [facility]  AIMS Total Score 0 0      AUDIT    Flowsheet Row Admission (Discharged) from 07/19/2016 in [name] INPATIENT BEHAVIORAL MEDICINE Admission (Discharged) from [facility]  Alcohol Use Disorder Identification Test Final Score (AUDIT) 0 0      CAGE-AID    Flowsheet Row ED to Hosp-Admission (Discharged) from 06/12/[facility] in [name]  SURGICAL  CAGE-AID Score 0      GAD-7    Flowsheet Row Initial Prenatal from 04/08/2023 in [facility] at [place] [facility] from 11/08/2022 in [facility] Office Visit from 10/31/2018 in [facility] [place] [facility] [place]. [facility] Office Visit from 05/30/2018 in [facility]  Total GAD-7 Score 0 2 0 0      PHQ2-9    Flowsheet Row Initial Prenatal from 04/08/2023 in [facility] at [facility] from 11/08/2022 in [facility] Office Visit from 04/19/2022 in [facility] Office Visit from 10/31/2018 in [facility] [place] - A Dept Of [place]. [facility] Office Visit from 05/30/2018 in [facility]  PHQ-2 Total Score 0 0 0 0 0  PHQ-9 Total Score 0 0 -- 1 1      Flowsheet Row ED from 07/19/2023 in [facility] Emergency Department [facility] Admission (Discharged) from 04/10/2023 in [place] [facility] ED from 04/09/2023 in [facility] Emergency Department at [facility]  C-SSRS RISK CATEGORY No Risk No Risk No Risk        Assessment and Plan:  Follow-up 28 days for long-acting injectable Abilify 300 mg - Continue p.o. medications as indicated hydroxyzine  Collaboration of Care: Collaboration of Care: Medication Management AEB reports plans to  continue outpatient services in [place]  Patient/Guardian was advised Release of Information must be obtained prior to any record release in order to collaborate their care with an outside provider. Patient/Guardian was advised if they have not already  done so to contact the registration department to sign all necessary forms in order for us  to release information regarding their care.   Consent: Patient/Guardian gives verbal consent for treatment and assignment of benefits for services provided during this visit. Patient/Guardian expressed understanding and agreed to proceed.    Levester Reagin, NP 12/12/2023, 9:07 AM

## 2023-12-12 ENCOUNTER — Other Ambulatory Visit (HOSPITAL_COMMUNITY): Payer: Self-pay | Admitting: Family

## 2024-01-08 ENCOUNTER — Ambulatory Visit (HOSPITAL_COMMUNITY): Payer: MEDICAID

## 2024-01-09 ENCOUNTER — Inpatient Hospital Stay
Admit: 2024-01-09 | Discharge: 2024-01-10 | Disposition: A | Discharge status: Home / Self Care | Payer: PRIVATE HEALTH INSURANCE | Attending: Emergency Medicine

## 2024-01-09 ENCOUNTER — Ambulatory Visit (HOSPITAL_COMMUNITY): Payer: MEDICAID

## 2024-01-09 DIAGNOSIS — R202 Paresthesia of skin: Secondary | ICD-10-CM

## 2024-01-09 LAB — CBC WITH AUTO DIFFERENTIAL
Basophils %: 0.4 % (ref 0.0–1.0)
Basophils Absolute: 0.05 10*3/uL (ref 0.00–0.10)
Eosinophils %: 0.7 % (ref 0.0–7.0)
Eosinophils Absolute: 0.09 10*3/uL (ref 0.00–0.40)
Hematocrit: 39.2 % (ref 35.0–47.0)
Hemoglobin: 12.8 g/dL (ref 11.5–16.0)
Immature Granulocytes %: 0.4 % (ref 0.0–0.5)
Immature Granulocytes Absolute: 0.05 10*3/uL — ABNORMAL HIGH (ref 0.00–0.04)
Lymphocytes %: 17.9 % (ref 12.0–49.0)
Lymphocytes Absolute: 2.4 10*3/uL (ref 0.80–3.50)
MCH: 31.7 pg (ref 26.0–34.0)
MCHC: 32.7 g/dL (ref 30.0–36.5)
MCV: 97 FL (ref 80.0–99.0)
MPV: 9.2 FL (ref 8.9–12.9)
Monocytes %: 6.5 % (ref 5.0–13.0)
Monocytes Absolute: 0.87 10*3/uL (ref 0.00–1.00)
Neutrophils %: 74.1 % (ref 32.0–75.0)
Neutrophils Absolute: 9.96 10*3/uL — ABNORMAL HIGH (ref 1.80–8.00)
Nucleated RBCs: 0 /100{WBCs}
Platelets: 418 10*3/uL — ABNORMAL HIGH (ref 150–400)
RBC: 4.04 M/uL (ref 3.80–5.20)
RDW: 13 % (ref 11.5–14.5)
WBC: 13.4 10*3/uL — ABNORMAL HIGH (ref 3.6–11.0)
nRBC: 0 10*3/uL (ref 0.00–0.01)

## 2024-01-09 LAB — EXTRA TUBES HOLD

## 2024-01-09 NOTE — ED Triage Notes (Signed)
 Patient is coming in with right arm numbness and swelling that has been going on since Monday. Patient was seen at [facility] and admitted for 2 days. Patient states swelling was also to the face also. + weakness. Patient was discharged home on Benadryl  and Epi Pen and blood pressure medication. Patient woke up on Monday with these symptoms.

## 2024-01-09 NOTE — ED Provider Notes (Signed)
 [facility]  EMERGENCY DEPARTMENT ENCOUNTER      Pt Name: Anna Frazier  MRN: [id number]  Birthdate May 06, 1996  Date of evaluation: 01/09/2024  Provider: Vivian Groom, MD    CHIEF COMPLAINT       Chief Complaint   Patient presents with    Numbness         HISTORY OF PRESENT ILLNESS    HPI    Anna Frazier is a 28 y.o. female with PMH significant for depression, asthma who presents to the emergency department with complaints of 1 week intermittent right-sided facial and arm numbness tingling and swelling sensation.  Patient reports she went to [facility] on Monday and was admitted for 3 days, discharged yesterday.  Patient with medical report and discharge summary shows she had extensive workup including head CT, brain MRI, cardiac echo.  Patient given prescription for Zyrtec and EpiPen for possible allergic reaction causing facial and arm numbness tingling sensation with swelling.  Patient reports the swelling is much improved and resolved today but she came to the emergency department for a second opinion as they could not give her a definitive diagnosis yesterday when they discharged her from [facility].  Denies any new foods/detergent/home products.  Denies history of similar problems.  No other complaints.  Nursing Notes were reviewed.    REVIEW OF SYSTEMS       Review of Systems   Constitutional:  Negative for chills and fever.   HENT:  Positive for facial swelling.    Respiratory:  Negative for cough, shortness of breath and wheezing.    Cardiovascular:  Negative for chest pain.   Gastrointestinal:  Negative for abdominal pain, nausea and vomiting.   Genitourinary:  Negative for difficulty urinating and flank pain.   Skin:  Negative for wound.   Neurological:  Positive for numbness. Negative for headaches.   Psychiatric/Behavioral:  Negative for suicidal ideas.            PAST MEDICAL HISTORY     Past Medical History:   Diagnosis Date    Asthma     Asthma      Acute since childhood    Contraception     Nexplanon left arm    Depression     Obesity          SURGICAL HISTORY     History reviewed. No pertinent surgical history.      CURRENT MEDICATIONS       Previous Medications    ALBUTEROL SULFATE HFA (PROVENTIL;VENTOLIN;PROAIR) 108 (90 BASE) MCG/ACT INHALER    Inhale 2 puffs into the lungs every 6 hours as needed    CYANOCOBALAMIN  1000 MCG/ML INJECTION    Inject 1 mL into the muscle once for 1 dose To be done every 2 weeks for 4 injections, will pick up vials from pharmacy for in-clinic injection    FERROUS GLUCONATE  (FERGON) 324 (38 FE) MG TABLET    Take 1 tablet by mouth with breakfast and with evening meal    METHOCARBAMOL (ROBAXIN) 500 MG TABLET    Take 1 tablet by mouth    POLYETHYLENE GLYCOL (GLYCOLAX ) 17 GM/SCOOP POWDER    1 pack in 8 oz on water  a day    SENNOSIDES-DOCUSATE SODIUM  (SENOKOT-S) 8.6-50 MG TABLET    Take 1 tablet by mouth daily    VITAMIN C  (ASCORBIC ACID) 500 MG TABLET    Take 1 tablet by mouth daily       ALLERGIES  Patient has no known allergies.    FAMILY HISTORY       Family History   Problem Relation Age of Onset    Cancer Maternal Grandmother 51        Breast and liver CA    Diabetes Other     Hypertension Other     Diabetes Mother     Stroke Paternal Grandmother     Heart Disease Maternal Grandmother     Heart Disease Mother         SVT, ablation    High Cholesterol Mother     Other Mother         Fibromyalgia    Heart Disease Paternal Grandfather     Hypertension Mother           SOCIAL HISTORY       Social History     Socioeconomic History    Marital status: Single     Spouse name: None    Number of children: None    Years of education: None    Highest education level: None   Tobacco Use    Smoking status: Every Day     Types: Cigars    Smokeless tobacco: Former    Tobacco comments:     Quit smoking: Black and milds 1-2 a day cigar   Substance and Sexual Activity    Alcohol use: Yes     Alcohol/week: 1.0 standard drink of alcohol      Types: 1 Shots of liquor per week    Drug use: Yes     Types: Marijuana Karron Pagan)   Social History Narrative    ** Merged History Encounter **          Social Drivers of Health     Financial Resource Strain: Low Risk  (10/04/2022)    Overall Financial Resource Strain (CARDIA)     Difficulty of Paying Living Expenses: Not hard at all   Food Insecurity: No Food Insecurity (10/04/2022)    Hunger Vital Sign     Worried About Running Out of Food in the Last Year: Never true     Ran Out of Food in the Last Year: Never true   Transportation Needs: Unknown (10/04/2022)    PRAPARE - Transportation     Lack of Transportation (Non-Medical): No   Housing Stability: Unknown (10/04/2022)    Housing Stability Vital Sign     Unstable Housing in the Last Year: No       SCREENINGS         Glasgow Coma Scale  Eye Opening: Spontaneous  Best Verbal Response: Oriented  Best Motor Response: Obeys commands  Glasgow Coma Scale Score: 15                     CIWA Assessment  BP: 122/82  Pulse: 99                 PHYSICAL EXAM       ED Triage Vitals [01/09/24 1859]   BP Systolic BP Percentile Diastolic BP Percentile Temp Temp Source Pulse Respirations SpO2   (!) 145/99 -- -- 98.4 F (36.9 C) Oral 99 20 100 %      Height Weight - Scale         1.524 m (5') 81.7 kg (180 lb 1.9 oz)             Physical Exam  Vitals and nursing note reviewed.   Constitutional:  Appearance: Normal appearance.   HENT:      Head: Normocephalic and atraumatic.      Nose: Nose normal.      Mouth/Throat:      Mouth: Mucous membranes are moist.   Eyes:      Extraocular Movements: Extraocular movements intact.      Conjunctiva/sclera: Conjunctivae normal.      Pupils: Pupils are equal, round, and reactive to light.   Cardiovascular:      Rate and Rhythm: Normal rate and regular rhythm.      Pulses: Normal pulses.   Pulmonary:      Effort: Pulmonary effort is normal. No respiratory distress.      Breath sounds: Normal breath sounds. No wheezing or rhonchi.   Abdominal:       General: Bowel sounds are normal.      Palpations: Abdomen is soft.      Tenderness: There is no abdominal tenderness.   Musculoskeletal:         General: Normal range of motion.      Cervical back: Normal range of motion.   Skin:     General: Skin is warm.      Capillary Refill: Capillary refill takes less than 2 seconds.   Neurological:      General: No focal deficit present.      Mental Status: She is alert and oriented to person, place, and time.      GCS: GCS eye subscore is 4. GCS verbal subscore is 5. GCS motor subscore is 6.      Motor: No weakness.      Coordination: Coordination normal.      Gait: Gait is intact.   Psychiatric:         Mood and Affect: Mood normal.         DIAGNOSTIC RESULTS       No orders to display       Labs Reviewed   CBC WITH AUTO DIFFERENTIAL - Abnormal; Notable for the following components:       Result Value    WBC 13.4 (*)     Platelets 418 (*)     Neutrophils Absolute 9.96 (*)     Immature Granulocytes Absolute 0.05 (*)     All other components within normal limits   COMPREHENSIVE METABOLIC PANEL - Abnormal; Notable for the following components:    Potassium 3.3 (*)     Glucose 123 (*)     Creatinine 1.07 (*)     AST 13 (*)     Globulin 4.6 (*)     Albumin/Globulin Ratio 0.8 (*)     All other components within normal limits   VITAMIN B12 & FOLATE - Abnormal; Notable for the following components:    Vitamin B-12 1695 (*)     Folate 3.6 (*)     All other components within normal limits   MAGNESIUM   EXTRA TUBES HOLD           EMERGENCY DEPARTMENT COURSE and DIFFERENTIAL DIAGNOSIS/MDM:   Vitals:    Vitals:    01/09/24 1859 01/10/24 0000   BP: (!) 145/99 122/82   Pulse: 99    Resp: 20    Temp: 98.4 F (36.9 C)    TempSrc: Oral    SpO2: 100% 99%   Weight: 81.7 kg (180 lb 1.9 oz)    Height: 1.524 m (5')          Medical Decision Making  28 year old female with  history of depression presents with complaints of 1 week right-sided facial and right upper extremity numbness/tingling and  swelling sensation.  Patient reports no current symptoms and swelling has resolved.  Denies fever, chills, nausea, vomiting, chest pain, shortness of breath.  Reviewed medical records from previous visit as she was discharged yesterday.  Extensive workup at [name] and advised to follow-up with rheumatology.  Advised patient today blood work unremarkable except for elevated B12 level which is secondary to receiving B12 injections while at [facility].  Advised patient to continue her follow-up with primary care physician and rheumatology as advised by previous team.  Patient expressed understanding and agreeable with plan for discharge.    Amount and/or Complexity of Data Reviewed  Labs:  Decision-making details documented in ED Course.            REASSESSMENT     ED Course as of 01/10/24 0018   Thu Jan 09, 2024   2358 Vitamin B-12(!): 1695 [LA]   2359 Folate(!): 3.6 [LA]   2359 Hemoglobin Quant: 12.8 [LA]      ED Course User Index  [LA] Ian Maine, MD     Discussed elevated B12 level with patient.  Patient reports she was given injections well with [facility] for B12.      FINAL IMPRESSION      1. Paresthesias    2. Excessive vitamin B12 intake          DISPOSITION/PLAN   DISPOSITION Decision To Discharge 01/10/2024 12:17:49 AM          (Please note that portions of this note were completed with a voice recognition program.  Efforts were made to edit the dictations but occasionally words are mis-transcribed.)    Vivian Groom, MD (electronically signed)  Attending Emergency Physician           Ian Maine, MD  01/10/24 [phone]

## 2024-01-09 NOTE — ED Notes (Signed)
 28 yo F presenting with numbness, weakness, and swelling of the RUE and the R side of the face since Monday. She was seen at [facility] and admitted for 2 days. She states they worked her up for a stroke, but it was negative. She states she was told they do not know what is causing symptoms and discharged her with benadryl . Other recent hx notable for 1 week ago, she fell in pothole and was seen at that time and diagnosed with a concussion. Otherwise denies trauma. Denies fever, URI symptoms, tick bites, skin changes. She has seasonal allergies. She has h/o anemia but otherwise denies any chronic medical conditions.     7:02 PM  I have evaluated the patient as the Provider in Rapid Medical Evaluation (RME). I have reviewed her vital signs and the triage nurse assessment. I have talked with the patient and any available family and advised that I am the provider in triage and have ordered the appropriate study to initiate their work up based on the clinical presentation during my assessment. I have advised that the patient will be accommodated in the Main ED as soon as possible. I have also requested to contact the triage nurse or myself immediately if the patient experiences any changes in their condition during this brief waiting period.  Bailey Bolus Sheanna Dail, PA-C      Alezander Dimaano M, PA-C  01/09/24 1902

## 2024-01-10 LAB — COMPREHENSIVE METABOLIC PANEL
ALT: 25 U/L (ref 12–78)
AST: 13 U/L — ABNORMAL LOW (ref 15–37)
Albumin/Globulin Ratio: 0.8 — ABNORMAL LOW (ref 1.1–2.2)
Albumin: 3.5 g/dL (ref 3.5–5.0)
Alk Phosphatase: 95 U/L (ref 45–117)
Anion Gap: 7 mmol/L (ref 2–12)
BUN/Creatinine Ratio: 13 (ref 12–20)
BUN: 14 mg/dL (ref 6–20)
CO2: 23 mmol/L (ref 21–32)
Calcium: 9.3 mg/dL (ref 8.5–10.1)
Chloride: 107 mmol/L (ref 97–108)
Creatinine: 1.07 mg/dL — ABNORMAL HIGH (ref 0.55–1.02)
Est, Glom Filt Rate: 72 mL/min/{1.73_m2} (ref >60)
Globulin: 4.6 g/dL — ABNORMAL HIGH (ref 2.0–4.0)
Glucose: 123 mg/dL — ABNORMAL HIGH (ref 65–100)
Potassium: 3.3 mmol/L — ABNORMAL LOW (ref 3.5–5.1)
Sodium: 137 mmol/L (ref 136–145)
Total Bilirubin: 0.3 mg/dL (ref 0.2–1.0)
Total Protein: 8.1 g/dL (ref 6.4–8.2)

## 2024-01-10 LAB — MAGNESIUM: Magnesium: 1.9 mg/dL (ref 1.6–2.4)

## 2024-01-10 LAB — VITAMIN B12 & FOLATE
Folate: 3.6 ng/mL — ABNORMAL LOW (ref 5.0–21.0)
Vitamin B-12: 1695 pg/mL — ABNORMAL HIGH (ref 193–986)

## 2024-01-10 NOTE — ED Notes (Signed)
 Patient discharged by Floyd Hutchinson, MD - pt sent to the front lobby, with strong and steady gait, no acute distress noted at time of discharge - Discharge information / home RX / and reasons to return to the ED were reviewed by the ED provider.

## 2024-01-10 NOTE — ED Notes (Signed)
Bedside and Verbal shift change report given to Jason Fila, Charity fundraiser (Cabin crew) by Michelle Nasuti, RN (offgoing nurse). Report included the following information Nurse Handoff Report and Index.

## 2024-02-03 ENCOUNTER — Inpatient Hospital Stay
Admit: 2024-02-03 | Discharge: 2024-02-03 | Disposition: A | Discharge status: Home / Self Care | Payer: Medicaid (Managed Care) | Attending: Emergency Medicine

## 2024-02-03 DIAGNOSIS — R202 Paresthesia of skin: Secondary | ICD-10-CM

## 2024-02-03 LAB — URINALYSIS WITH REFLEX TO CULTURE
BACTERIA, URINE: NEGATIVE /HPF
Bilirubin, Urine: NEGATIVE
Glucose, Ur: NEGATIVE mg/dL
Ketones, Urine: NEGATIVE mg/dL
Leukocyte Esterase, Urine: NEGATIVE
Nitrite, Urine: NEGATIVE
Protein, UA: NEGATIVE mg/dL
Specific Gravity, UA: 1.007 (ref 1.003–1.030)
Urobilinogen, Urine: 0.2 EU/dL (ref 0.2–1.0)
pH, Urine: 5.5 (ref 5.0–8.0)

## 2024-02-03 LAB — URINE DRUG SCREEN
Amphetamine, Urine: NEGATIVE
Barbiturates, Urine: NEGATIVE
Benzodiazepines, Urine: NEGATIVE
Cocaine, Urine: NEGATIVE
Methadone, Urine: NEGATIVE
Opiates, Urine: NEGATIVE
Phencyclidine, Urine: NEGATIVE
THC, TH-Cannabinol, Urine: NEGATIVE

## 2024-02-03 LAB — CBC WITH AUTO DIFFERENTIAL
Basophils %: 0.5 % (ref 0.0–1.0)
Basophils Absolute: 0.05 10*3/uL (ref 0.00–0.10)
Eosinophils %: 0.4 % (ref 0.0–7.0)
Eosinophils Absolute: 0.04 10*3/uL (ref 0.00–0.40)
Hematocrit: 38.8 % (ref 35.0–47.0)
Hemoglobin: 12.9 g/dL (ref 11.5–16.0)
Immature Granulocytes %: 0.4 % (ref 0.0–0.5)
Immature Granulocytes Absolute: 0.04 10*3/uL (ref 0.00–0.04)
Lymphocytes %: 30 % (ref 12.0–49.0)
Lymphocytes Absolute: 3.13 10*3/uL (ref 0.80–3.50)
MCH: 31.7 pg (ref 26.0–34.0)
MCHC: 33.2 g/dL (ref 30.0–36.5)
MCV: 95.3 FL (ref 80.0–99.0)
MPV: 8.9 FL (ref 8.9–12.9)
Monocytes %: 6 % (ref 5.0–13.0)
Monocytes Absolute: 0.62 10*3/uL (ref 0.00–1.00)
Neutrophils %: 62.7 % (ref 32.0–75.0)
Neutrophils Absolute: 6.54 10*3/uL (ref 1.80–8.00)
Nucleated RBCs: 0 /100{WBCs}
Platelets: 455 10*3/uL — ABNORMAL HIGH (ref 150–400)
RBC: 4.07 M/uL (ref 3.80–5.20)
RDW: 13.2 % (ref 11.5–14.5)
WBC: 10.4 10*3/uL (ref 3.6–11.0)
nRBC: 0 10*3/uL (ref 0.00–0.01)

## 2024-02-03 LAB — COMPREHENSIVE METABOLIC PANEL
ALT: 22 U/L (ref 12–78)
AST: 18 U/L (ref 15–37)
Albumin/Globulin Ratio: 0.8 — ABNORMAL LOW (ref 1.1–2.2)
Albumin: 3.6 g/dL (ref 3.5–5.0)
Alk Phosphatase: 95 U/L (ref 45–117)
Anion Gap: 7 mmol/L (ref 2–12)
BUN/Creatinine Ratio: 11 — ABNORMAL LOW (ref 12–20)
BUN: 9 mg/dL (ref 6–20)
CO2: 23 mmol/L (ref 21–32)
Calcium: 8.3 mg/dL — ABNORMAL LOW (ref 8.5–10.1)
Chloride: 112 mmol/L — ABNORMAL HIGH (ref 97–108)
Creatinine: 0.83 mg/dL (ref 0.55–1.02)
Est, Glom Filt Rate: >90 mL/min/{1.73_m2} (ref >60)
Globulin: 4.5 g/dL — ABNORMAL HIGH (ref 2.0–4.0)
Glucose: 105 mg/dL — ABNORMAL HIGH (ref 65–100)
Potassium: 3.3 mmol/L — ABNORMAL LOW (ref 3.5–5.1)
Sodium: 142 mmol/L (ref 136–145)
Total Bilirubin: 0.2 mg/dL (ref 0.2–1.0)
Total Protein: 8.1 g/dL (ref 6.4–8.2)

## 2024-02-03 LAB — ETHANOL: Ethanol Lvl: 256 mg/dL — ABNORMAL HIGH (ref <10)

## 2024-02-03 LAB — C-REACTIVE PROTEIN: CRP: 0.41 mg/dL — ABNORMAL HIGH (ref 0.00–0.30)

## 2024-02-03 LAB — TSH: TSH, 3rd Generation: 0.91 u[IU]/mL (ref 0.36–3.74)

## 2024-02-03 LAB — MAGNESIUM: Magnesium: 2 mg/dL (ref 1.6–2.4)

## 2024-02-03 LAB — POC PREGNANCY UR-QUAL: Preg Test, Ur: NEGATIVE

## 2024-02-03 LAB — PHOSPHORUS: Phosphorus: 2.2 mg/dL — ABNORMAL LOW (ref 2.6–4.7)

## 2024-02-03 LAB — SEDIMENTATION RATE: Sed Rate, Automated: 2 mm/h (ref 0–20)

## 2024-02-03 MED ORDER — POTASSIUM CHLORIDE CRYS ER 10 MEQ PO TBCR
10 | ORAL_TABLET | Freq: Two times a day (BID) | ORAL | 0 refills | 30.00000 days | Status: AC
Start: 2024-02-03 — End: ?

## 2024-02-03 MED ORDER — KETOROLAC TROMETHAMINE 30 MG/ML IJ SOLN
30 | INTRAMUSCULAR | Status: AC
Start: 2024-02-03 — End: 2024-02-03
  Administered 2024-02-03: 06:00:00 30 mg via INTRAVENOUS

## 2024-02-03 MED FILL — KETOROLAC TROMETHAMINE 30 MG/ML IJ SOLN: 30 MG/ML | INTRAMUSCULAR | Qty: 1 | Fill #0

## 2024-02-03 NOTE — ED Triage Notes (Signed)
 Right face and arm numbness and swelling since last night. Pain in face and mouth

## 2024-02-03 NOTE — ED Provider Notes (Signed)
 [facility] EMERGENCY DEPARTMENT  EMERGENCY DEPARTMENT ENCOUNTER      Pt Name: Anna Frazier  MRN: [id number]  Birthdate 05/03/1996  Date of evaluation: 02/03/2024  Provider: Deneice Finland, MD    CHIEF COMPLAINT       Chief Complaint   Patient presents with    Facial Pain    Numbness         HISTORY OF PRESENT ILLNESS   (Location/Symptom, Timing/Onset, Context/Setting, Quality, Duration, Modifying Factors, Severity)  Note limiting factors.   28 year old female, with a past medical history significant for asthma, depression, morbid obesity who presents to the ER with complaint of general malaise, right-sided swelling and numbness and tingling sensation and pain that began 2 days ago.  She describes a dull, aching or throbbing discomfort, severity 8 out of 10, constant worse with movement, no relieving factors.  The patient insisted that she feels swelling mostly to the right side.  She denies any fever, sore throat, cough or congestion, neck pain or stiffness, chest pain or shortness of breath exertion, vomiting, diarrhea, constipation, dysuria, dizziness, extremity weakness numbness, sick contact or recent travel.  Of note is that the patient was seen at [facility] for the same symptoms a month ago and had extensive workup including CT scan and MRI, 2D echo without any significant findings.  The patient was also seen at [facility] ED today for the same symptoms and left because they were not given any explanation or symptoms of discomfort.            Review of External Medical Records:     Nursing Notes were reviewed.    REVIEW OF SYSTEMS    (2-9 systems for level 4, 10 or more for level 5)     Review of Systems   All other systems reviewed and are negative.      Except as noted above the remainder of the review of systems was reviewed and negative.       PAST MEDICAL HISTORY     Past Medical History:   Diagnosis Date    Anemia     Asthma     Asthma     Acute since childhood     Contraception     Nexplanon left arm    Depression     Obesity          SURGICAL HISTORY     No past surgical history on file.      CURRENT MEDICATIONS       Discharge Medication List as of 02/03/2024  3:08 AM        CONTINUE these medications which have NOT CHANGED    Details   cyanocobalamin  1000 MCG/ML injection Inject 1 mL into the muscle once for 1 dose To be done every 2 weeks for 4 injections, will pick up vials from pharmacy for in-clinic injection, Disp-1 mL, R-3Normal      ferrous gluconate  (FERGON) 324 (38 Fe) MG tablet Take 1 tablet by mouth with breakfast and with evening meal, Disp-180 tablet, R-0Normal      vitamin C  (ASCORBIC ACID) 500 MG tablet Take 1 tablet by mouth daily, Disp-30 tablet, R-3Normal      polyethylene glycol (GLYCOLAX ) 17 GM/SCOOP powder 1 pack in 8 oz on water  a day, Disp-100 each, R-3Normal      sennosides-docusate sodium  (SENOKOT-S) 8.6-50 MG tablet Take 1 tablet by mouth daily, Disp-30 tablet, R-1Normal      albuterol sulfate HFA (PROVENTIL;VENTOLIN;PROAIR) 108 (90 Base)  MCG/ACT inhaler Inhale 2 puffs into the lungs every 6 hours as neededHistorical Med      methocarbamol (ROBAXIN) 500 MG tablet Take 1 tablet by mouthHistorical Med             ALLERGIES     Patient has no known allergies.    FAMILY HISTORY       Family History   Problem Relation Age of Onset    Cancer Maternal Grandmother 36        Breast and liver CA    Diabetes Other     Hypertension Other     Diabetes Mother     Stroke Paternal Grandmother     Heart Disease Maternal Grandmother     Heart Disease Mother         SVT, ablation    High Cholesterol Mother     Other Mother         Fibromyalgia    Heart Disease Paternal Grandfather     Hypertension Mother           SOCIAL HISTORY       Social History     Socioeconomic History    Marital status: Single   Tobacco Use    Smoking status: Every Day     Types: Cigars    Smokeless tobacco: Former    Tobacco comments:     Quit smoking: Black and milds 1-2 a day cigar    Substance and Sexual Activity    Alcohol use: Yes     Alcohol/week: 1.0 standard drink of alcohol     Types: 1 Shots of liquor per week    Drug use: Yes     Types: Marijuana Karron Pagan)   Social History Narrative    ** Merged History Encounter **          Social Drivers of Health     Financial Resource Strain: Low Risk  (10/04/2022)    Overall Financial Resource Strain (CARDIA)     Difficulty of Paying Living Expenses: Not hard at all   Food Insecurity: No Food Insecurity (10/04/2022)    Hunger Vital Sign     Worried About Running Out of Food in the Last Year: Never true     Ran Out of Food in the Last Year: Never true   Transportation Needs: Unknown (10/04/2022)    PRAPARE - Transportation     Lack of Transportation (Non-Medical): No   Housing Stability: Unknown (10/04/2022)    Housing Stability Vital Sign     Unstable Housing in the Last Year: No           PHYSICAL EXAM    (up to 7 for level 4, 8 or more for level 5)     ED Triage Vitals [02/03/24 0114]   BP Systolic BP Percentile Diastolic BP Percentile Temp Temp src Pulse Respirations SpO2   (!) 138/99 -- -- 97.5 F (36.4 C) -- (!) 108 17 100 %      Height Weight - Scale         1.626 m (5\' 4" ) 81.6 kg (179 lb 14.3 oz)             Body mass index is 30.88 kg/m.    Physical Exam  Vitals and nursing note reviewed. Exam conducted with a chaperone present.         CONSTITUTIONAL: Well-appearing; well-nourished; in no apparent distress  HEAD: Normocephalic; atraumatic  EYES: PERRL; EOM intact; conjunctiva and sclera are clear bilaterally.  ENT: No rhinorrhea; normal pharynx with no tonsillar hypertrophy; mucous membranes pink/moist, no erythema, no exudate.  NECK: Supple; non-tender; no cervical lymphadenopathy  CARD: Normal S1, S2; no murmurs, rubs, or gallops. Regular rate and rhythm.  RESP: Normal respiratory effort; breath sounds clear and equal bilaterally; no wheezes, rhonchi, or rales.  ABD: Normal bowel sounds; non-distended; non-tender; no palpable organomegaly, no  masses, no bruits.  Back Exam: Normal inspection; no vertebral point tenderness, no CVA tenderness. Normal range of motion.  EXT: Normal ROM in all four extremities; non-tender to palpation; no swelling or deformity; distal pulses are normal, no edema.  SKIN: Warm; dry; no rash.  NEURO:Alert and oriented x 3, coherent, NII-XII grossly intact, no pronator drift; normal gait; reflexes intact; sensory and motor are non-focal.        DIAGNOSTIC RESULTS     EKG: All EKG's are interpreted by the Emergency Department Physician who either signs or Co-signs this chart in the absence of a cardiologist.        RADIOLOGY:   Non-plain film images such as CT, Ultrasound and MRI are read by the radiologist. Plain radiographic images are visualized and preliminarily interpreted by the emergency physician with the below findings:        Interpretation per the Radiologist below, if available at the time of this note:    No orders to display        LABS:  Labs Reviewed   CBC WITH AUTO DIFFERENTIAL - Abnormal; Notable for the following components:       Result Value    Platelets 455 (*)     All other components within normal limits   COMPREHENSIVE METABOLIC PANEL - Abnormal; Notable for the following components:    Potassium 3.3 (*)     Chloride 112 (*)     Glucose 105 (*)     BUN/Creatinine Ratio 11 (*)     Calcium 8.3 (*)     Globulin 4.5 (*)     Albumin/Globulin Ratio 0.8 (*)     All other components within normal limits   PHOSPHORUS - Abnormal; Notable for the following components:    Phosphorus 2.2 (*)     All other components within normal limits   ETHANOL - Abnormal; Notable for the following components:    Ethanol Lvl 256 (*)     All other components within normal limits   URINALYSIS WITH REFLEX TO CULTURE - Abnormal; Notable for the following components:    Blood, Urine SMALL (*)     All other components within normal limits   C-REACTIVE PROTEIN - Abnormal; Notable for the following components:    CRP 0.41 (*)     All other  components within normal limits   MAGNESIUM   TSH   URINE DRUG SCREEN   SEDIMENTATION RATE   POC PREGNANCY UR-QUAL   POC PREGNANCY UR-QUAL       All other labs were within normal range or not returned as of this dictation.    EMERGENCY DEPARTMENT COURSE and DIFFERENTIAL DIAGNOSIS/MDM:   Vitals:    Vitals:    02/03/24 0114 02/03/24 0201 02/03/24 0215 02/03/24 0230   BP: (!) 138/99 (!) 134/97 134/85    Pulse: (!) 108 96  100   Resp: 17 18     Temp: 97.5 F (36.4 C)      SpO2: 100% 100% 99% 99%   Weight: 81.6 kg (179 lb 14.3 oz)      Height: 1.626 m (5\' 4" )  Medical Decision Making  Assessment: 28 year old female presented ER for evaluation for arthralgia with paresthesia of left upper extremity with stable exam and vital signs.  NIH stroke scale is 0 at this time.  There is no discernible deficit.  She appears well.  Suspicion for vascular urgently CVA is extremely low at this time.  The patient likely has a rheumatologic disorder that would require further investigation with outpatient PCP.  Tonight, she will be evaluation for electrolyte abnormality with analgesia and stable exam.    Plan: Lab/IV fluid/education, reassurance, symptomatic treatment/serial exam/ Monitor and Reevaluate.      Amount and/or Complexity of Data Reviewed  Labs: ordered.    Risk  Prescription drug management.            REASSESSMENT      Progress Note:   Pt has been reexamined by Deneice Finland, MD. Pt is feeling much better. Symptoms have improved. All available results have been reviewed with pt and any available family. Pt understands sx, dx, and tx in ED. Care plan has been outlined and questions have been answered. Pt is ready to go home. Will send home on paresthesia/alcohol intoxication hypokalemia instruction.  Prescription medicated.. Outpatient referral with PCP/rheumatology for evaluation further treatment as needed. Written by Mendell Bontempo, MD,4:41 AM       CONSULTS:  None    PROCEDURES:  Unless otherwise noted below,  none     Procedures      FINAL IMPRESSION      1. Paresthesias    2. Acute alcoholic intoxication without complication    3. Hypokalemia          DISPOSITION/PLAN   DISPOSITION Decision To Discharge 02/03/2024 03:07:59 AM      PATIENT REFERRED TO:  Gwyn Leos, MD  [address]  [address] [place] [address]  [phone]    Schedule an appointment as soon as possible for a visit   for reevaluation and further treatment as needed    Barba Levin, MD  [address] [place] [address]  [phone]    Schedule an appointment as soon as possible for a visit in 1 week  for reevaluation and further treatment as needed      DISCHARGE MEDICATIONS:  Discharge Medication List as of 02/03/2024  3:08 AM        START taking these medications    Details   potassium chloride (KLOR-CON M) 10 MEQ extended release tablet Take 1 tablet by mouth 2 times daily, Disp-60 tablet, R-0Normal               (Please note that portions of this note were completed with a voice recognition program.  Efforts were made to edit the dictations but occasionally words are mis-transcribed.)    Deneice Finland, MD (electronically signed)  Emergency Attending Physician / Physician Assistant / Nurse Practitioner            Vanna Genta, MD  02/03/24 [phone]

## 2024-02-25 ENCOUNTER — Other Ambulatory Visit: Payer: Self-pay

## 2024-02-25 ENCOUNTER — Encounter (HOSPITAL_COMMUNITY): Payer: Self-pay | Admitting: Obstetrics and Gynecology

## 2024-02-25 ENCOUNTER — Inpatient Hospital Stay (HOSPITAL_COMMUNITY)
Admission: AD | Admit: 2024-02-25 | Discharge: 2024-02-25 | Disposition: A | Discharge status: Home / Self Care | Payer: Self-pay | Attending: Obstetrics and Gynecology | Admitting: Obstetrics and Gynecology

## 2024-02-25 ENCOUNTER — Encounter: Payer: Self-pay | Admitting: Obstetrics and Gynecology

## 2024-02-25 DIAGNOSIS — O09291 Supervision of pregnancy with other poor reproductive or obstetric history, first trimester: Secondary | ICD-10-CM | POA: Insufficient documentation

## 2024-02-25 DIAGNOSIS — M545 Low back pain, unspecified: Secondary | ICD-10-CM | POA: Insufficient documentation

## 2024-02-25 DIAGNOSIS — O2341 Unspecified infection of urinary tract in pregnancy, first trimester: Secondary | ICD-10-CM

## 2024-02-25 DIAGNOSIS — O26891 Other specified pregnancy related conditions, first trimester: Secondary | ICD-10-CM | POA: Insufficient documentation

## 2024-02-25 DIAGNOSIS — M549 Dorsalgia, unspecified: Secondary | ICD-10-CM

## 2024-02-25 DIAGNOSIS — Z87440 Personal history of urinary (tract) infections: Secondary | ICD-10-CM | POA: Insufficient documentation

## 2024-02-25 DIAGNOSIS — Z3A09 9 weeks gestation of pregnancy: Secondary | ICD-10-CM

## 2024-02-25 LAB — WET PREP, GENITAL
Clue Cells Wet Prep HPF POC: NONE SEEN
Sperm: NONE SEEN
Trich, Wet Prep: NONE SEEN
WBC, Wet Prep HPF POC: <10 (ref <10)
Yeast Wet Prep HPF POC: NONE SEEN

## 2024-02-25 LAB — POCT PREGNANCY, URINE: Preg Test, Ur: POSITIVE — AB

## 2024-02-25 LAB — URINALYSIS, ROUTINE W REFLEX MICROSCOPIC
Bilirubin Urine: NEGATIVE
Glucose, UA: NEGATIVE mg/dL
Hgb urine dipstick: NEGATIVE
Ketones, ur: NEGATIVE mg/dL
Nitrite: NEGATIVE
Protein, ur: 30 mg/dL — AB
Specific Gravity, Urine: 1.023 (ref 1.005–1.030)
WBC, UA: >50 WBC/hpf (ref 0–5)
pH: 7 (ref 5.0–8.0)

## 2024-02-25 MED ORDER — CEFADROXIL 500 MG PO CAPS: 500.0000 mg | ORAL_CAPSULE | Freq: Two times a day (BID) | ORAL | 0 refills | Status: AC

## 2024-02-25 MED ORDER — ACETAMINOPHEN 500 MG PO TABS
1000.0000 mg | ORAL_TABLET | Freq: Once | ORAL | Status: AC
  Administered 2024-02-25: 1000 mg via ORAL
  Filled 2024-02-25: qty 2

## 2024-02-25 MED ORDER — CYCLOBENZAPRINE HCL 5 MG PO TABS
10.0000 mg | ORAL_TABLET | Freq: Once | ORAL | Status: AC
  Administered 2024-02-25: 10 mg via ORAL
  Filled 2024-02-25: qty 2

## 2024-02-25 MED ORDER — PHENAZOPYRIDINE HCL 200 MG PO TABS: 200.0000 mg | ORAL_TABLET | Freq: Three times a day (TID) | ORAL | 0 refills | Status: DC

## 2024-02-25 NOTE — MAU Provider Note (Signed)
 History     CSN: [id number]  Arrival date and time: 02/25/24 9280   Event Date/Time   First Provider Initiated Contact with Patient 02/25/24 [phone]      Chief Complaint  Patient presents with   Back Pain   Urinary Frequency   burning with urination   HPI Ms. Melissa [name] is a 28 y.o. year old [id number] female at [redacted]w[redacted]d weeks gestation who presents to MAU reporting urinary frequency, burning with urination, inability to hold urine, and lower back pain x 3-4 weeks. She reports she was seen in a hospital in [place] for these sx's 3-4 weeks ago. This is when she found out she was pregnant. She states, That hospital said they didn't know what was going on with me. I just moved back here and my sx's are getting worse.   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 06/27/2022    Past Surgical History:  Procedure Laterality Date   chest tube  06/11/2020    Family History  Problem Relation Age of Onset   Asthma Mother    Hypertension Father    Healthy Brother    Healthy Maternal Grandmother    Healthy Maternal Grandfather    Healthy Paternal Grandmother    Healthy Paternal Grandfather    Cancer Neg Hx    Diabetes Neg Hx    Heart disease Neg Hx    Stroke Neg Hx     Social History   Tobacco Use   Smoking status: Never   Smokeless tobacco: Never  Vaping Use   Vaping status: Never Used   Substance Use Topics   Alcohol use: Yes    Comment: a few sips every few weeks   Drug use: Yes    Types: Marijuana    Comment: once weekly    Allergies: No Known Allergies  Medications Prior to Admission  Medication Sig Dispense Refill Last Dose/Taking   ARIPiprazole  ER (ABILIFY  MAINTENA) 300 MG PRSY prefilled syringe Inject 300 mg into the muscle every 28 (twenty-eight) days. 1 each 11    ferrous sulfate  325 (65 FE) MG tablet Take 1 tablet (325 mg total) by mouth every other day. (Patient not taking: Reported on 09/24/2023) 90 tablet 1    ibuprofen  (ADVIL ) 600 MG tablet Take 1 tablet (600 mg total) by mouth every 6 (six) hours as needed. (Patient not taking: Reported on 09/24/2023) 60 tablet 0     Review of Systems  Constitutional: Negative.   HENT: Negative.    Eyes: Negative.   Respiratory: Negative.    Cardiovascular: Negative.   Gastrointestinal: Negative.  Endocrine: Negative.   Genitourinary:  Positive for difficulty urinating, dysuria, frequency, hematuria and urgency.  Musculoskeletal:  Positive for back pain.  Skin: Negative.   Allergic/Immunologic: Negative.   Neurological: Negative.   Hematological: Negative.   Psychiatric/Behavioral: Negative.     Physical Exam   Blood pressure 112/81, pulse 99, temperature 98.5 F (36.9 C), resp. rate 16, height 5' 7 (1.702 m), weight 92.1 kg, last menstrual period 12/22/2023, SpO2 100%, unknown if currently breastfeeding.  Physical Exam Vitals and nursing note reviewed.  Constitutional:      Appearance: Normal appearance. She is normal weight.   Cardiovascular:     Rate and Rhythm: Normal rate.  Pulmonary:     Effort: Pulmonary effort is normal.   Neurological:     Mental Status: She is alert and oriented to person, place, and time.   Psychiatric:        Mood and Affect: Mood normal.        Behavior: Behavior normal.        Thought Content: Thought content normal.        Judgment: Judgment normal.     Reassessment @ 0930: Per registration, patient's partner/FOB aggressively yelled at her demanding that she leave with him immediately because he had to go to work. Patient left without signing AMA form with registration clerk.  MAU Course  Procedures  MDM CCUA UCx -- Results pending  Flexeril 10 mg po -- unable to assess d/t patient leaving prior to reassessment Tylenol  1000 mg -- unable to assess d/t patient leaving prior to reassessment  Results for orders placed or performed during the hospital encounter of 02/25/24 (from the past 24 hours)  Pregnancy, urine POC     Status: Abnormal   Collection Time: 02/25/24  7:37 AM  Result Value Ref Range   Preg Test, Ur POSITIVE (A) NEGATIVE  Wet prep, genital     Status: None   Collection Time: 02/25/24  7:40 AM   Specimen: Urine, Clean Catch  Result Value Ref Range   Yeast Wet Prep HPF POC NONE SEEN NONE SEEN   Trich, Wet Prep NONE SEEN NONE SEEN   Clue Cells Wet Prep HPF POC NONE SEEN NONE SEEN   WBC, Wet Prep HPF POC <10 <10   Sperm NONE SEEN   Urinalysis, Routine w reflex microscopic -Urine, Clean Catch     Status: Abnormal   Collection Time: 02/25/24  7:41 AM  Result Value Ref Range   Color, Urine YELLOW YELLOW   APPearance CLOUDY (A) CLEAR   Specific Gravity, Urine 1.023 1.005 - 1.030   pH 7.0 5.0 - 8.0   Glucose, UA NEGATIVE NEGATIVE mg/dL   Hgb urine dipstick NEGATIVE NEGATIVE   Bilirubin Urine NEGATIVE NEGATIVE   Ketones, ur NEGATIVE NEGATIVE mg/dL   Protein, ur 30 (A) NEGATIVE mg/dL   Nitrite NEGATIVE NEGATIVE   Leukocytes,Ua MODERATE (A) NEGATIVE   RBC / HPF 0-5 0 - 5 RBC/hpf   WBC, UA >50 0 - 5 WBC/hpf   Bacteria, UA RARE (A) NONE SEEN   Squamous Epithelial / HPF 0-5 0 - 5 /HPF   Mucus PRESENT     Assessment and Plan  1. UTI (urinary tract infection) during pregnancy, first trimester (Primary) - Information provided on UTI pregnancy - Prescription for: Cefadroxil  500 mg po BID x 10 days   2. Back pain  affecting pregnancy in first trimester - Advised to take Tylenol  1000 mg po every 8 hrs prn back pain - Reassurance  given that back pain should improve with resolution of the UTI  3. Dysuria during pregnancy in first trimester - Prescription for: Pyridium 200 mg po TID x 3 days - Explained to patient the effects this medication will have on the appearance of her urine  4. [redacted] weeks gestation of pregnancy   - Left prior to completion of observation period after medication. - MyChart message sent to patient informing her that she left prior to getting pregnancy verification letter, discharge instructions, safe medication list and list of local OB provider list  Ala Cart, CNM 02/25/2024, 8:51 AM

## 2024-02-25 NOTE — Discharge Instructions (Addendum)

## 2024-02-25 NOTE — MAU Note (Addendum)
 Melissa [name] is a 28 y.o. at Unknown here in MAU reporting: urinary frequency, burning, inability to hold urine and lower bilateral back pain. Noted blood in urine, Would also like to get tested for STI.  Home UPT pos  LMP: 12/22/23 Onset of complaint: 3 weeks but got worse last night Pain score: 9/10 back Vitals:   02/25/24 0734  BP: 112/81  Pulse: 99  Resp: 16  Temp: 98.5 F (36.9 C)  SpO2: 100%     FHT: na  Lab orders placed from triage: ua, UPT

## 2024-02-25 NOTE — MAU Note (Signed)
 Pt called for DC home with note. Registration reported that FOB came in demanding that pt leave now- pt left AMA.

## 2024-02-26 LAB — GC/CHLAMYDIA PROBE AMP (~~LOC~~) NOT AT ARMC
Chlamydia: NEGATIVE
Comment: NEGATIVE
Comment: NORMAL
Neisseria Gonorrhea: NEGATIVE

## 2024-02-27 LAB — CULTURE, OB URINE: Culture: >=100000 — AB

## 2024-03-02 ENCOUNTER — Encounter: Payer: Self-pay | Admitting: Obstetrics and Gynecology

## 2024-03-02 ENCOUNTER — Ambulatory Visit: Payer: Self-pay | Admitting: Obstetrics and Gynecology

## 2024-03-02 DIAGNOSIS — O2340 Unspecified infection of urinary tract in pregnancy, unspecified trimester: Secondary | ICD-10-CM | POA: Insufficient documentation

## 2024-04-14 ENCOUNTER — Other Ambulatory Visit: Payer: Self-pay

## 2024-04-14 ENCOUNTER — Inpatient Hospital Stay (HOSPITAL_COMMUNITY)
Admission: AD | Admit: 2024-04-14 | Discharge: 2024-04-14 | Disposition: A | Discharge status: Home / Self Care | Payer: Self-pay | Attending: Obstetrics and Gynecology | Admitting: Obstetrics and Gynecology

## 2024-04-14 DIAGNOSIS — Z3492 Encounter for supervision of normal pregnancy, unspecified, second trimester: Secondary | ICD-10-CM

## 2024-04-14 DIAGNOSIS — Z3A16 16 weeks gestation of pregnancy: Secondary | ICD-10-CM

## 2024-04-14 DIAGNOSIS — Z59811 Housing instability, housed, with risk of homelessness: Secondary | ICD-10-CM

## 2024-04-14 DIAGNOSIS — Z59819 Housing instability, housed unspecified: Secondary | ICD-10-CM | POA: Insufficient documentation

## 2024-04-14 DIAGNOSIS — Z5989 Other problems related to housing and economic circumstances: Secondary | ICD-10-CM | POA: Insufficient documentation

## 2024-04-14 DIAGNOSIS — O26892 Other specified pregnancy related conditions, second trimester: Secondary | ICD-10-CM

## 2024-04-14 DIAGNOSIS — O218 Other vomiting complicating pregnancy: Secondary | ICD-10-CM | POA: Insufficient documentation

## 2024-04-14 DIAGNOSIS — A084 Viral intestinal infection, unspecified: Secondary | ICD-10-CM

## 2024-04-14 LAB — COMPREHENSIVE METABOLIC PANEL WITH GFR
ALT: 52 U/L — ABNORMAL HIGH (ref 0–44)
AST: 42 U/L — ABNORMAL HIGH (ref 15–41)
Albumin: 3 g/dL — ABNORMAL LOW (ref 3.5–5.0)
Alkaline Phosphatase: 102 U/L (ref 38–126)
Anion gap: 13 (ref 5–15)
BUN: <5 mg/dL — ABNORMAL LOW (ref 6–20)
CO2: 18 mmol/L — ABNORMAL LOW (ref 22–32)
Calcium: 8.8 mg/dL — ABNORMAL LOW (ref 8.9–10.3)
Chloride: 102 mmol/L (ref 98–111)
Creatinine, Ser: 0.73 mg/dL (ref 0.44–1.00)
GFR, Estimated: >60 mL/min (ref >60)
Glucose, Bld: 80 mg/dL (ref 70–99)
Potassium: 3.2 mmol/L — ABNORMAL LOW (ref 3.5–5.1)
Sodium: 133 mmol/L — ABNORMAL LOW (ref 135–145)
Total Bilirubin: 1.6 mg/dL — ABNORMAL HIGH (ref 0.0–1.2)
Total Protein: 7.4 g/dL (ref 6.5–8.1)

## 2024-04-14 LAB — MAGNESIUM: Magnesium: 1.8 mg/dL (ref 1.7–2.4)

## 2024-04-14 LAB — CBC WITH DIFFERENTIAL/PLATELET
Abs Immature Granulocytes: 0.03 K/uL (ref 0.00–0.07)
Basophils Absolute: 0.1 K/uL (ref 0.0–0.1)
Basophils Relative: 1 %
Eosinophils Absolute: 0.2 K/uL (ref 0.0–0.5)
Eosinophils Relative: 2 %
HCT: 35.6 % — ABNORMAL LOW (ref 36.0–46.0)
Hemoglobin: 11.7 g/dL — ABNORMAL LOW (ref 12.0–15.0)
Immature Granulocytes: 0 %
Lymphocytes Relative: 16 %
Lymphs Abs: 1.8 K/uL (ref 0.7–4.0)
MCH: 27.1 pg (ref 26.0–34.0)
MCHC: 32.9 g/dL (ref 30.0–36.0)
MCV: 82.6 fL (ref 80.0–100.0)
Monocytes Absolute: 1 K/uL (ref 0.1–1.0)
Monocytes Relative: 9 %
Neutro Abs: 8 K/uL — ABNORMAL HIGH (ref 1.7–7.7)
Neutrophils Relative %: 72 %
Platelets: 303 K/uL (ref 150–400)
RBC: 4.31 MIL/uL (ref 3.87–5.11)
RDW: 14.2 % (ref 11.5–15.5)
WBC: 10.9 K/uL — ABNORMAL HIGH (ref 4.0–10.5)
nRBC: 0 % (ref 0.0–0.2)

## 2024-04-14 LAB — URINALYSIS, ROUTINE W REFLEX MICROSCOPIC
Glucose, UA: NEGATIVE mg/dL
Ketones, ur: 80 mg/dL — AB
Nitrite: NEGATIVE
Protein, ur: 100 mg/dL — AB
Specific Gravity, Urine: 1.015 (ref 1.005–1.030)
WBC, UA: >50 WBC/hpf (ref 0–5)
pH: 6 (ref 5.0–8.0)

## 2024-04-14 LAB — HEMOGLOBIN A1C
Hgb A1c MFr Bld: 4.8 % (ref 4.8–5.6)
Mean Plasma Glucose: 91.06 mg/dL

## 2024-04-14 LAB — HIV ANTIBODY (ROUTINE TESTING W REFLEX): HIV Screen 4th Generation wRfx: NONREACTIVE

## 2024-04-14 LAB — TYPE AND SCREEN
ABO/RH(D): B POS
Antibody Screen: NEGATIVE

## 2024-04-14 LAB — HEPATITIS B SURFACE ANTIGEN: Hepatitis B Surface Ag: NONREACTIVE

## 2024-04-14 MED ORDER — ONDANSETRON 4 MG PO TBDP: 8.0000 mg | ORAL_TABLET | Freq: Once | ORAL | Status: DC

## 2024-04-14 MED ORDER — SODIUM CHLORIDE 0.9 % IV BOLUS
1000.0000 mL | Freq: Once | INTRAVENOUS | Status: AC
  Administered 2024-04-14: 1000 mL via INTRAVENOUS

## 2024-04-14 MED ORDER — ONDANSETRON HCL 4 MG/2ML IJ SOLN
4.0000 mg | Freq: Once | INTRAMUSCULAR | Status: AC
  Administered 2024-04-14: 4 mg via INTRAVENOUS
  Filled 2024-04-14: qty 2

## 2024-04-14 MED ORDER — LACTATED RINGERS IV BOLUS
1000.0000 mL | Freq: Once | INTRAVENOUS | Status: AC
  Administered 2024-04-14: 1000 mL via INTRAVENOUS

## 2024-04-14 MED ORDER — FAMOTIDINE IN NACL 20-0.9 MG/50ML-% IV SOLN
20.0000 mg | Freq: Once | INTRAVENOUS | Status: AC
  Administered 2024-04-14: 20 mg via INTRAVENOUS
  Filled 2024-04-14: qty 50

## 2024-04-14 MED ORDER — SODIUM CHLORIDE 0.9 % IV SOLN
2.0000 g | Freq: Once | INTRAVENOUS | Status: AC
  Administered 2024-04-14: 2 g via INTRAVENOUS
  Filled 2024-04-14: qty 20

## 2024-04-14 MED ORDER — LACTATED RINGERS IV BOLUS: 1000.0000 mL | Freq: Once | INTRAVENOUS | Status: DC

## 2024-04-14 NOTE — MAU Provider Note (Incomplete)
 Chief Complaint: Nausea   Event Date/Time   First Provider Initiated Contact with Patient 04/14/24 1810     SUBJECTIVE HPI  Melissa [name] is a 28 y.o. [id number] at [redacted]w[redacted]d who presents to Maternity Admissions reporting nauseated and vomiting w/ constant abdominal pain in the last 3 days. She recently dx with UTIs early last month. Pt states she has not been able to eat food d/t n/v and has been vomiting every hour she states that she also has bowel movement every time she throwing up or when she was trying to eat. She denies fever, chills, or vaginal discharge. Off note patient with unsecure housing, currently live with her stepmother, however she has been seeking for shelter for pregnant women.    Past Medical History:  Diagnosis Date   Anxiety    Asthma    Blow out fracture of orbit (HCC) 01/30/2021   Chlamydia    Collapsed lung 06/11/2020   Depression    Dizziness    Fetal growth restriction antepartum 04/23/2021   Gonorrhea    Pneumothorax, traumatic 06/12/2020   PTSD (post-traumatic stress disorder)    Rib fracture    Schizoaffective disorder (HCC)    Syncope    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 06/27/2022   OB History  Gravida Para Term Preterm AB Living  6 2 2  0 2 2  SAB IAB Ectopic Multiple Live Births  2 0 0 0 2    # Outcome Date GA Lbr Len/2nd Weight Sex Type Anes PTL Lv  6 Current           5 SAB 04/10/23 [redacted]w[redacted]d         4 Term 11/01/22 [redacted]w[redacted]d / 00:07 2310 g F Vag-Spont EPI  LIV  3 Term 04/25/21 [redacted]w[redacted]d  2523 g F Vag-Spont  N LIV  2 SAB 2014 [redacted]w[redacted]d         1 Gravida            Past Surgical History:  Procedure Laterality Date   chest tube  06/11/2020   Social History   Socioeconomic History   Marital status: Single    Spouse name: Not on file   Number of children: 2   Years of education: 12   Highest education level: Some college, no degree  Occupational History   Occupation: unemployed  Tobacco Use   Smoking status: Never    Smokeless tobacco: Never  Vaping Use   Vaping status: Never Used  Substance and Sexual Activity   Alcohol use: Yes    Comment: a few sips every few weeks   Drug use: Yes    Types: Marijuana    Comment: once weekly   Sexual activity: Yes    Partners: Male    Birth control/protection: None  Other Topics Concern   Not on file  Social History Narrative   Not on file   Social Drivers of Health   Financial Resource Strain: Low Risk  (02/15/2023)   Overall Financial Resource Strain (CARDIA)    Difficulty of Paying Living Expenses: Not very hard  Food Insecurity: No Food Insecurity (04/10/2023)   Hunger Vital Sign    Worried About Running Out of Food in the Last Year: Never true    Ran Out of Food in the Last Year: Never true  Transportation Needs: No Transportation Needs (04/10/2023)   PRAPARE - Administrator, Civil Service (Medical): No    Lack of Transportation (Non-Medical): No  Physical  Activity: Inactive (02/15/2023)   Exercise Vital Sign    Days of Exercise per Week: 0 days    Minutes of Exercise per Session: 0 min  Stress: No Stress Concern Present (02/15/2023)   [employer] of Occupational Health - Occupational Stress Questionnaire    Feeling of Stress : Not at all  Social Connections: Unknown (02/15/2023)   Social Connection and Isolation Panel    Frequency of Communication with Friends and Family: More than three times a week    Frequency of Social Gatherings with Friends and Family: More than three times a week    Attends Religious Services: Never    Database administrator or Organizations: No    Attends Banker Meetings: Never    Marital Status: Not on file  Recent Concern: Social Connections - Socially Isolated (02/15/2023)   Social Connection and Isolation Panel    Frequency of Communication with Friends and Family: More than three times a week    Frequency of Social Gatherings with Friends and Family: More than three times a week     Attends Religious Services: Never    Database administrator or Organizations: No    Attends Banker Meetings: Never    Marital Status: Never married  Intimate Partner Violence: Not At Risk (04/10/2023)   Humiliation, Afraid, Rape, and Kick questionnaire    Fear of Current or Ex-Partner: No    Emotionally Abused: No    Physically Abused: No    Sexually Abused: No   Family History  Problem Relation Age of Onset   Asthma Mother    Hypertension Father    Healthy Brother    Healthy Maternal Grandmother    Healthy Maternal Grandfather    Healthy Paternal Grandmother    Healthy Paternal Grandfather    Cancer Neg Hx    Diabetes Neg Hx    Heart disease Neg Hx    Stroke Neg Hx    No current facility-administered medications on file prior to encounter.   Current Outpatient Medications on File Prior to Encounter  Medication Sig Dispense Refill   ARIPiprazole  ER (ABILIFY  MAINTENA) 300 MG PRSY prefilled syringe Inject 300 mg into the muscle every 28 (twenty-eight) days. 1 each 11   phenazopyridine  (PYRIDIUM ) 200 MG tablet Take 1 tablet (200 mg total) by mouth 3 (three) times daily. 9 tablet 0   No Known Allergies  I have reviewed patient's Past Medical Hx, Surgical Hx, Family Hx, Social Hx, medications and allergies.   Review of Systems  Respiratory: Negative.    Cardiovascular: Negative.   Gastrointestinal:  Positive for abdominal pain, diarrhea, nausea and vomiting.    OBJECTIVE Patient Vitals for the past 24 hrs:  BP Temp Temp src Pulse Resp SpO2 Height Weight  04/14/24 1742 116/73 98.1 F (36.7 C) Oral 89 18 99 % 5' 7 (1.702 m) 85.6 kg   Constitutional: Well-developed, well-nourished female in no acute distress.  Cardiovascular: normal rate & rhythm, no murmur Respiratory: normal rate and effort. Lung sounds clear throughout GI: Abd soft, Tenderness with light palpation in all 4 quadrants. No guarding or rebound tenderness. No distension. Last BM 04/14/24 MS:  Extremities nontender, no edema, normal ROM Neurologic: Alert and oriented x 4.     LAB RESULTS No results found for this or any previous visit (from the past 24 hours).  IMAGING No results found.  MAU COURSE Orders Placed This Encounter  Procedures   Urinalysis, Routine w reflex microscopic -Urine, Clean Catch  CBC with Differential/Platelet   Comprehensive metabolic panel   Magnesium    Hepatitis B surface antigen   Rubella screen   RPR   HIV Antibody (routine testing w rflx)   Hemoglobin A1c   Type and screen  [facility]   Meds ordered this encounter  Medications   DISCONTD: ondansetron  (ZOFRAN -ODT) disintegrating tablet 8 mg   lactated ringers  bolus 1,000 mL   famotidine  (PEPCID ) IVPB 20 mg premix   ondansetron  (ZOFRAN ) injection 4 mg   MDM - Labs : CBC w/ diff , CMP w/ Mg - Prenatal care labs : HIV antibody, Type&Screen, RPR, Rubella, Hep B - Urine Cx - LR 1000 ml IVF - Pepcid  IV 20 mg once - Zofran  IV 4 mg Once  ASSESSMENT/Plan 1. Acute gastritis without hemorrhage, unspecified gastritis type (Primary) - Labs : CBC w/ diff , CMP w/ Mg - LR 1000 ml IVF - Pepcid  IV 20 mg once - Zofran  IV 4 mg Once  2. Housing instability due to imminent risk of homelessness No prenatal care currently - Prenatal care labs : HIV antibody, Type&Screen, RPR, Rubella, Hep B  3. [redacted] weeks gestation of pregnancy     Allergies as of 04/14/2024   No Known Allergies       Vishal Sandlin B, DO PGY 1 Family Medicine Resident  04/14/2024 6:36 PM

## 2024-04-14 NOTE — MAU Provider Note (Signed)
 Chief Complaint:  Nausea  HPI   Event Date/Time   First Provider Initiated Contact with Patient 04/14/24 1810     Melissa [name] is a 28 y.o. [id number] at [redacted]w[redacted]d who presents to maternity admissions reporting nausea and vomiting with epigastric pain and diarrhea whenever she vomits for the past few days, although has felt nauseated her entire pregnancy. Denies fever, chills, vaginal discharge but does endorse urinary frequency, burning and urgency. Has not established care yet due to insecure housing. Living with her stepmother but is seeking to move into a shelter for pregnant women.    Pregnancy Course: Diagnosed with a UTI in early July, finished treatment but then symptoms returned.  Past Medical History:  Diagnosis Date   Anxiety    Asthma    Blow out fracture of orbit (HCC) 01/30/2021   Chlamydia    Collapsed lung 06/11/2020   Depression    Dizziness    Fetal growth restriction antepartum 04/23/2021   Gonorrhea    Pneumothorax, traumatic 06/12/2020   PTSD (post-traumatic stress disorder)    Rib fracture    Schizoaffective disorder (HCC)    Syncope    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 06/27/2022   OB History  Gravida Para Term Preterm AB Living  6 2 2  0 2 2  SAB IAB Ectopic Multiple Live Births  2 0 0 0 2    # Outcome Date GA Lbr Len/2nd Weight Sex Type Anes PTL Lv  6 Current           5 SAB 04/10/23 [redacted]w[redacted]d         4 Term 11/01/22 [redacted]w[redacted]d / 00:07 5 lb 1.5 oz (2.31 kg) F Vag-Spont EPI  LIV  3 Term 04/25/21 [redacted]w[redacted]d  5 lb 9 oz (2.523 kg) F Vag-Spont  N LIV  2 SAB 2014 [redacted]w[redacted]d         1 Gravida            Past Surgical History:  Procedure Laterality Date   chest tube  06/11/2020   Family History  Problem Relation Age of Onset   Asthma Mother    Hypertension Father    Healthy Brother    Healthy Maternal Grandmother    Healthy Maternal Grandfather    Healthy Paternal Grandmother    Healthy Paternal Grandfather    Cancer Neg Hx    Diabetes Neg  Hx    Heart disease Neg Hx    Stroke Neg Hx    Social History   Tobacco Use   Smoking status: Never   Smokeless tobacco: Never  Vaping Use   Vaping status: Never Used  Substance Use Topics   Alcohol use: Yes    Comment: a few sips every few weeks   Drug use: Yes    Types: Marijuana    Comment: once weekly   No Known Allergies Medications Prior to Admission  Medication Sig Dispense Refill Last Dose/Taking   ARIPiprazole  ER (ABILIFY  MAINTENA) 300 MG PRSY prefilled syringe Inject 300 mg into the muscle every 28 (twenty-eight) days. 1 each 11    phenazopyridine  (PYRIDIUM ) 200 MG tablet Take 1 tablet (200 mg total) by mouth 3 (three) times daily. 9 tablet 0    I have reviewed patient's Past Medical Hx, Surgical Hx, Family Hx, Social Hx, medications and allergies.   ROS  Pertinent items noted in HPI and remainder of comprehensive ROS otherwise negative.   PHYSICAL EXAM  Patient Vitals for the past 24 hrs:  BP Temp Temp src Pulse Resp SpO2 Height Weight  04/14/24 1742 116/73 98.1 F (36.7 C) Oral 89 18 99 % 5' 7 (1.702 m) 188 lb 11.2 oz (85.6 kg)   Constitutional: Well-developed, well-nourished female in mild distress (nauseated) Cardiovascular: normal rate & rhythm, warm and well-perfused Respiratory: normal effort, no problems with respiration noted GI: Abd soft, non-tender, non-distended MS: Extremities nontender, no edema, normal ROM Neurologic: Alert and oriented x 4.  GU: no CVA tenderness Pelvic: exam deferred   Labs: Results for orders placed or performed during the hospital encounter of 04/14/24 (from the past 24 hours)  CBC with Differential/Platelet     Status: Abnormal   Collection Time: 04/14/24  6:36 PM  Result Value Ref Range   WBC 10.9 (H) 4.0 - 10.5 K/uL   RBC 4.31 3.87 - 5.11 MIL/uL   Hemoglobin 11.7 (L) 12.0 - 15.0 g/dL   HCT 64.3 (L) 63.9 - 53.9 %   MCV 82.6 80.0 - 100.0 fL   MCH 27.1 26.0 - 34.0 pg   MCHC 32.9 30.0 - 36.0 g/dL   RDW 85.7 88.4 -  84.4 %   Platelets 303 150 - 400 K/uL   nRBC 0.0 0.0 - 0.2 %   Neutrophils Relative % 72 %   Neutro Abs 8.0 (H) 1.7 - 7.7 K/uL   Lymphocytes Relative 16 %   Lymphs Abs 1.8 0.7 - 4.0 K/uL   Monocytes Relative 9 %   Monocytes Absolute 1.0 0.1 - 1.0 K/uL   Eosinophils Relative 2 %   Eosinophils Absolute 0.2 0.0 - 0.5 K/uL   Basophils Relative 1 %   Basophils Absolute 0.1 0.0 - 0.1 K/uL   Immature Granulocytes 0 %   Abs Immature Granulocytes 0.03 0.00 - 0.07 K/uL  Comprehensive metabolic panel     Status: Abnormal   Collection Time: 04/14/24  6:36 PM  Result Value Ref Range   Sodium 133 (L) 135 - 145 mmol/L   Potassium 3.2 (L) 3.5 - 5.1 mmol/L   Chloride 102 98 - 111 mmol/L   CO2 18 (L) 22 - 32 mmol/L   Glucose, Bld 80 70 - 99 mg/dL   BUN <5 (L) 6 - 20 mg/dL   Creatinine, Ser 9.26 0.44 - 1.00 mg/dL   Calcium  8.8 (L) 8.9 - 10.3 mg/dL   Total Protein 7.4 6.5 - 8.1 g/dL   Albumin 3.0 (L) 3.5 - 5.0 g/dL   AST 42 (H) 15 - 41 U/L   ALT 52 (H) 0 - 44 U/L   Alkaline Phosphatase 102 38 - 126 U/L   Total Bilirubin 1.6 (H) 0.0 - 1.2 mg/dL   GFR, Estimated >39 >39 mL/min   Anion gap 13 5 - 15  Magnesium      Status: None   Collection Time: 04/14/24  6:36 PM  Result Value Ref Range   Magnesium  1.8 1.7 - 2.4 mg/dL  Type and screen [facility]     Status: None   Collection Time: 04/14/24  6:36 PM  Result Value Ref Range   ABO/RH(D) B POS    Antibody Screen NEG    Sample Expiration      04/17/2024,2359 Performed at [facility], [address] [place], [place] [address]   Hemoglobin A1c     Status: None   Collection Time: 04/14/24  6:36 PM  Result Value Ref Range   Hgb A1c MFr Bld 4.8 4.8 - 5.6 %   Mean Plasma Glucose 91.06 mg/dL  Urinalysis, Routine w reflex microscopic -Urine, Clean Catch     Status: Abnormal   Collection Time: 04/14/24  6:39 PM  Result Value Ref Range   Color, Urine AMBER (A) YELLOW   APPearance CLOUDY (A) CLEAR   Specific Gravity, Urine  1.015 1.005 - 1.030   pH 6.0 5.0 - 8.0   Glucose, UA NEGATIVE NEGATIVE mg/dL   Hgb urine dipstick SMALL (A) NEGATIVE   Bilirubin Urine SMALL (A) NEGATIVE   Ketones, ur 80 (A) NEGATIVE mg/dL   Protein, ur 899 (A) NEGATIVE mg/dL   Nitrite NEGATIVE NEGATIVE   Leukocytes,Ua LARGE (A) NEGATIVE   RBC / HPF 11-20 0 - 5 RBC/hpf   WBC, UA >50 0 - 5 WBC/hpf   Bacteria, UA MANY (A) NONE SEEN   Squamous Epithelial / HPF 11-20 0 - 5 /HPF   WBC Clumps PRESENT    Mucus PRESENT    Imaging:  No results found.  MDM & MAU COURSE  MDM: Moderate  MAU Course: Orders Placed This Encounter  Procedures   Culture, OB Urine   Urinalysis, Routine w reflex microscopic -Urine, Clean Catch   CBC with Differential/Platelet   Comprehensive metabolic panel   Magnesium    Hepatitis B surface antigen   Rubella screen   RPR   HIV Antibody (routine testing w rflx)   Hemoglobin A1c   Type and screen [facility]   Meds ordered this encounter  Medications   DISCONTD: ondansetron  (ZOFRAN -ODT) disintegrating tablet 8 mg   lactated ringers  bolus 1,000 mL   famotidine  (PEPCID ) IVPB 20 mg premix   ondansetron  (ZOFRAN ) injection 4 mg   cefTRIAXone  (ROCEPHIN ) 2 g in sodium chloride  0.9 % 100 mL IVPB    Antibiotic Indication::   UTI   DISCONTD: lactated ringers  bolus 1,000 mL   sodium chloride  0.9 % bolus 1,000 mL   LR bolus with labs, zofran  and pepcid  ordered which mostly relieved pain/nausea. Very dehydrated, U/A shows likely UTI but pt has a hard time picking up and remembering to take meds. Dr. Fredirick agreed to a 2g rocephin  dose IV + another bolus. Will also po challenge with ice chips.  Liver enzymes elevated, will redraw at new OB.  Feeling better after another bag of fluids, stable for discharge home. Message sent to [name] for scheduling assistance, also gave a list of shelters.  ASSESSMENT   1. Viral gastroenteritis   2. Housing instability due to imminent risk of homelessness   3. [redacted]  weeks gestation of pregnancy   4. Presence of fetal heart sounds in second trimester    PLAN  Discharge home in stable condition with return precautions.  Follow up at [facility] for new OB intake.    Allergies as of 04/14/2024   No Known Allergies      Medication List     STOP taking these medications    phenazopyridine  200 MG tablet Commonly known as: PYRIDIUM        TAKE these medications    Abilify  Maintena 300 MG Prsy prefilled syringe Generic drug: ARIPiprazole  ER Inject 300 mg into the muscle every 28 (twenty-eight) days.       Cornell Finder, CNM, MSN, IBCLC Certified Nurse Midwife, [facility]

## 2024-04-14 NOTE — MAU Note (Signed)
 Melissa [name] is a 28 y.o. at [redacted]w[redacted]d here in MAU reporting: nausea and vomiting starting a week ago. Hasn't been able to eat anything in three days. Also complaining of sharp abdominal pain that is constant. Also states that she thinks that she still has a UTI because she is having burning and frequency. Denies VB or discharge.    Pain score: 10/10 Vitals:   04/14/24 1742  BP: 116/73  Pulse: 89  Resp: 18  Temp: 98.1 F (36.7 C)  SpO2: 99%     FHT: 138  Lab orders placed from triage: UA

## 2024-04-15 ENCOUNTER — Inpatient Hospital Stay (HOSPITAL_COMMUNITY)
Admission: AD | Admit: 2024-04-15 | Discharge: 2024-04-16 | Disposition: A | Discharge status: Home / Self Care | Payer: Self-pay | Attending: Obstetrics and Gynecology | Admitting: Obstetrics and Gynecology

## 2024-04-15 ENCOUNTER — Encounter (HOSPITAL_COMMUNITY): Payer: Self-pay | Admitting: Obstetrics & Gynecology

## 2024-04-15 DIAGNOSIS — N3000 Acute cystitis without hematuria: Secondary | ICD-10-CM | POA: Insufficient documentation

## 2024-04-15 DIAGNOSIS — O2312 Infections of bladder in pregnancy, second trimester: Secondary | ICD-10-CM | POA: Insufficient documentation

## 2024-04-15 DIAGNOSIS — Z59819 Housing instability, housed unspecified: Secondary | ICD-10-CM | POA: Insufficient documentation

## 2024-04-15 DIAGNOSIS — Z3A16 16 weeks gestation of pregnancy: Secondary | ICD-10-CM

## 2024-04-15 DIAGNOSIS — O219 Vomiting of pregnancy, unspecified: Secondary | ICD-10-CM

## 2024-04-15 LAB — COMPREHENSIVE METABOLIC PANEL WITH GFR
ALT: 52 U/L — ABNORMAL HIGH (ref 0–44)
AST: 39 U/L (ref 15–41)
Albumin: 2.4 g/dL — ABNORMAL LOW (ref 3.5–5.0)
Alkaline Phosphatase: 86 U/L (ref 38–126)
Anion gap: 9 (ref 5–15)
BUN: <5 mg/dL — ABNORMAL LOW (ref 6–20)
CO2: 18 mmol/L — ABNORMAL LOW (ref 22–32)
Calcium: 8 mg/dL — ABNORMAL LOW (ref 8.9–10.3)
Chloride: 104 mmol/L (ref 98–111)
Creatinine, Ser: 0.76 mg/dL (ref 0.44–1.00)
GFR, Estimated: >60 mL/min (ref >60)
Glucose, Bld: 79 mg/dL (ref 70–99)
Potassium: 3.2 mmol/L — ABNORMAL LOW (ref 3.5–5.1)
Sodium: 131 mmol/L — ABNORMAL LOW (ref 135–145)
Total Bilirubin: 1.8 mg/dL — ABNORMAL HIGH (ref 0.0–1.2)
Total Protein: 6 g/dL — ABNORMAL LOW (ref 6.5–8.1)

## 2024-04-15 LAB — URINALYSIS, ROUTINE W REFLEX MICROSCOPIC
Bilirubin Urine: NEGATIVE
Glucose, UA: NEGATIVE mg/dL
Hgb urine dipstick: NEGATIVE
Ketones, ur: 80 mg/dL — AB
Nitrite: NEGATIVE
Protein, ur: 30 mg/dL — AB
Specific Gravity, Urine: 1.015 (ref 1.005–1.030)
WBC, UA: >50 WBC/hpf (ref 0–5)
pH: 6 (ref 5.0–8.0)

## 2024-04-15 LAB — CBC
HCT: 31.9 % — ABNORMAL LOW (ref 36.0–46.0)
Hemoglobin: 10.4 g/dL — ABNORMAL LOW (ref 12.0–15.0)
MCH: 27.9 pg (ref 26.0–34.0)
MCHC: 32.6 g/dL (ref 30.0–36.0)
MCV: 85.5 fL (ref 80.0–100.0)
Platelets: 266 K/uL (ref 150–400)
RBC: 3.73 MIL/uL — ABNORMAL LOW (ref 3.87–5.11)
RDW: 14.6 % (ref 11.5–15.5)
WBC: 12.5 K/uL — ABNORMAL HIGH (ref 4.0–10.5)
nRBC: 0 % (ref 0.0–0.2)

## 2024-04-15 LAB — RPR: RPR Ser Ql: NONREACTIVE

## 2024-04-15 LAB — LIPASE, BLOOD: Lipase: 39 U/L (ref 11–51)

## 2024-04-15 MED ORDER — SCOPOLAMINE 1 MG/3DAYS TD PT72: 1.0000 | MEDICATED_PATCH | TRANSDERMAL | 12 refills | Status: DC

## 2024-04-15 MED ORDER — LACTATED RINGERS IV BOLUS
1000.0000 mL | Freq: Once | INTRAVENOUS | Status: AC
  Administered 2024-04-15: 1000 mL via INTRAVENOUS

## 2024-04-15 MED ORDER — SCOPOLAMINE 1 MG/3DAYS TD PT72
1.0000 | MEDICATED_PATCH | TRANSDERMAL | Status: DC
  Administered 2024-04-15: 1.5 mg via TRANSDERMAL
  Filled 2024-04-15: qty 1

## 2024-04-15 MED ORDER — SUCRALFATE 1 GM/10ML PO SUSP
1.0000 g | Freq: Three times a day (TID) | ORAL | Status: DC
  Administered 2024-04-15: 1 g via ORAL
  Filled 2024-04-15 (×2): qty 10

## 2024-04-15 MED ORDER — ONDANSETRON 4 MG PO TBDP
4.0000 mg | ORAL_TABLET | Freq: Once | ORAL | Status: AC
  Administered 2024-04-15: 4 mg via ORAL
  Filled 2024-04-15: qty 1

## 2024-04-15 MED ORDER — LIDOCAINE VISCOUS HCL 2 % MT SOLN
15.0000 mL | Freq: Once | OROMUCOSAL | Status: AC
  Administered 2024-04-15: 15 mL via ORAL
  Filled 2024-04-15: qty 15

## 2024-04-15 MED ORDER — HALOPERIDOL LACTATE 5 MG/ML IJ SOLN
1.0000 mg | Freq: Four times a day (QID) | INTRAMUSCULAR | Status: DC | PRN
  Administered 2024-04-15: 1 mg via INTRAVENOUS
  Filled 2024-04-15: qty 1

## 2024-04-15 MED ORDER — ONDANSETRON 4 MG PO TBDP: 4.0000 mg | ORAL_TABLET | Freq: Four times a day (QID) | ORAL | 0 refills | Status: DC | PRN

## 2024-04-15 MED ORDER — HALOPERIDOL 1 MG PO TABS: 2.0000 mg | ORAL_TABLET | Freq: Three times a day (TID) | ORAL | 2 refills | Status: DC

## 2024-04-15 MED ORDER — SUCRALFATE 1 GM/10ML PO SUSP: 1.0000 g | Freq: Three times a day (TID) | ORAL | 0 refills | Status: DC

## 2024-04-15 NOTE — MAU Note (Signed)
 MAU Triage Note:  .Melissa [name] is a 28 y.o. at [redacted]w[redacted]d here in MAU reporting: on-going nausea and vomiting for the past three days. She reports 2 episodes of vomiting today. No active vomiting during triage. Also having a burning sensation in her throat. Has not taken any medications for the nausea. Denies VB or LOF.  Patient complaint: Dehydrated  Pain Score: 10-Worst pain ever Pain Location: Throat     Onset of complaint: 3 days ago LMP: Patient's last menstrual period was 12/22/2023.  Vitals:   04/15/24 2007  BP: (!) 101/54  Pulse: 100  Resp: 16  Temp: 98.7 F (37.1 C)  SpO2: 100%    FHT:  Fetal Heart Rate Mode: Doppler Baseline Rate (A): 165 bpm Lab orders placed from triage: UA

## 2024-04-15 NOTE — Discharge Instructions (Signed)

## 2024-04-15 NOTE — MAU Provider Note (Signed)
 Chief Complaint: Nausea and Emesis  SUBJECTIVE HPI: Melissa [name] is a 28 y.o. [id number] at [redacted]w[redacted]d by LMP who presents to maternity admissions reporting continued N/V x 3 days. Was seen yesterday for the same. 2 episodes of emesis today. Burning throat. Not taken anything for nausea.  She denies vaginal bleeding, vaginal itching/burning, urinary symptoms, h/a, dizziness, n/v, or fever/chills.    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 06/27/2022   Past Surgical History:  Procedure Laterality Date   chest tube  06/11/2020   Social History   Socioeconomic History   Marital status: Single    Spouse name: Not on file   Number of children: 2   Years of education: 12   Highest education level: Some college, no degree  Occupational History   Occupation: unemployed  Tobacco Use   Smoking status: Never   Smokeless tobacco: Never  Vaping Use   Vaping status: Never Used  Substance and Sexual Activity   Alcohol use: Not Currently    Comment: a few sips every few weeks   Drug use: Not Currently    Types: Marijuana    Comment: once weekly   Sexual activity: Yes    Partners: Male    Birth control/protection: None  Other Topics Concern   Not on file  Social History Narrative   Not on file   Social Drivers of Health   Financial Resource Strain: Low Risk  (02/15/2023)   Overall Financial Resource Strain (CARDIA)    Difficulty of Paying Living Expenses: Not very hard  Food Insecurity: No Food Insecurity (04/10/2023)   Hunger Vital Sign    Worried About Running Out of Food in  the Last Year: Never true    Ran Out of Food in the Last Year: Never true  Transportation Needs: No Transportation Needs (04/10/2023)   PRAPARE - Administrator, Civil Service (Medical): No    Lack of Transportation (Non-Medical): No  Physical Activity: Inactive (02/15/2023)   Exercise Vital Sign    Days of Exercise per Week: 0 days    Minutes of Exercise per Session: 0 min  Stress: No Stress Concern Present (02/15/2023)   [employer] of Occupational Health - Occupational Stress Questionnaire    Feeling of Stress : Not at all  Social Connections: Unknown (02/15/2023)   Social Connection and Isolation Panel    Frequency of Communication with Friends and Family: More than three times a week    Frequency of Social Gatherings with Friends and Family: More than three times a week    Attends Religious Services: Never    Database administrator or Organizations: No    Attends Banker Meetings: Never    Marital Status: Not on file  Recent Concern: Social Connections - Socially Isolated (02/15/2023)   Social Connection and Isolation Panel    Frequency of Communication with Friends and Family: More than three times a week    Frequency of Social Gatherings with Friends and Family: More than  three times a week    Attends Religious Services: Never    Active Member of Clubs or Organizations: No    Attends Banker Meetings: Never    Marital Status: Never married  Intimate Partner Violence: Not At Risk (04/10/2023)   Humiliation, Afraid, Rape, and Kick questionnaire    Fear of Current or Ex-Partner: No    Emotionally Abused: No    Physically Abused: No    Sexually Abused: No   No current facility-administered medications on file prior to encounter.   Current Outpatient Medications on File Prior to Encounter  Medication Sig Dispense Refill   ARIPiprazole  ER (ABILIFY  MAINTENA) 300 MG PRSY prefilled syringe Inject 300 mg into the muscle every 28 (twenty-eight)  days. 1 each 11   No Known Allergies  ROS:  Pertinent positives/negatives listed above.  I have reviewed patient's Past Medical Hx, Surgical Hx, Family Hx, Social Hx, medications and allergies.   Physical Exam  Patient Vitals for the past 24 hrs:  BP Temp Temp src Pulse Resp SpO2 Height Weight  04/15/24 2007 (!) 101/54 98.7 F (37.1 C) Oral 100 16 100 % 5' 7 (1.702 m) 84.3 kg   Constitutional: Dry mucus membranes. Well-developed, well-nourished female in no acute distress.  Cardiovascular: normal rate Respiratory: normal effort GI: Abd soft, non-tender. Pos BS x 4 MS: Extremities nontender, no edema, normal ROM Neurologic: Alert and oriented x 4.  GU: Neg CVAT.  Doppler: 165  LAB RESULTS Results for orders placed or performed during the hospital encounter of 04/15/24 (from the past 24 hours)  Urinalysis, Routine w reflex microscopic -Urine, Clean Catch     Status: Abnormal   Collection Time: 04/15/24  8:10 PM  Result Value Ref Range   Color, Urine AMBER (A) YELLOW   APPearance CLEAR CLEAR   Specific Gravity, Urine 1.015 1.005 - 1.030   pH 6.0 5.0 - 8.0   Glucose, UA NEGATIVE NEGATIVE mg/dL   Hgb urine dipstick NEGATIVE NEGATIVE   Bilirubin Urine NEGATIVE NEGATIVE   Ketones, ur 80 (A) NEGATIVE mg/dL   Protein, ur 30 (A) NEGATIVE mg/dL   Nitrite NEGATIVE NEGATIVE   Leukocytes,Ua MODERATE (A) NEGATIVE   RBC / HPF 0-5 0 - 5 RBC/hpf   WBC, UA >50 0 - 5 WBC/hpf   Bacteria, UA RARE (A) NONE SEEN   Squamous Epithelial / HPF 0-5 0 - 5 /HPF   Mucus PRESENT   CBC     Status: Abnormal   Collection Time: 04/15/24  9:23 PM  Result Value Ref Range   WBC 12.5 (H) 4.0 - 10.5 K/uL   RBC 3.73 (L) 3.87 - 5.11 MIL/uL   Hemoglobin 10.4 (L) 12.0 - 15.0 g/dL   HCT 68.0 (L) 63.9 - 53.9 %   MCV 85.5 80.0 - 100.0 fL   MCH 27.9 26.0 - 34.0 pg   MCHC 32.6 30.0 - 36.0 g/dL   RDW 85.3 88.4 - 84.4 %   Platelets 266 150 - 400 K/uL   nRBC 0.0 0.0 - 0.2 %    --/--/B POS (08/19  1836)  IMAGING No results found.  MAU Management/MDM: Orders Placed This Encounter  Procedures   Urinalysis, Routine w reflex microscopic -Urine, Clean Catch   CBC   Comprehensive metabolic panel with GFR   Lipase, blood   Discharge patient Discharge disposition: 01-Home or Self Care; Discharge patient date: 04/15/2024    Meds ordered this encounter  Medications   lactated ringers  bolus 1,000 mL   ondansetron  (  ZOFRAN -ODT) disintegrating tablet 4 mg   scopolamine  (TRANSDERM-SCOP) 1 MG/3DAYS 1.5 mg   lidocaine  (XYLOCAINE ) 2 % viscous mouth solution 15 mL   sucralfate  (CARAFATE ) 1 GM/10ML suspension 1 g   haloperidol  lactate (HALDOL ) injection 1 mg   scopolamine  (TRANSDERM-SCOP) 1 MG/3DAYS    Sig: Place 1 patch (1.5 mg total) onto the skin every 3 (three) days.    Dispense:  10 patch    Refill:  12   haloperidol  (HALDOL ) 1 MG tablet    Sig: Take 2 tablets (2 mg total) by mouth 3 (three) times daily.    Dispense:  90 tablet    Refill:  2   ondansetron  (ZOFRAN -ODT) 4 MG disintegrating tablet    Sig: Take 1 tablet (4 mg total) by mouth every 6 (six) hours as needed for nausea.    Dispense:  20 tablet    Refill:  0   sucralfate  (CARAFATE ) 1 GM/10ML suspension    Sig: Take 10 mLs (1 g total) by mouth 4 (four) times daily -  with meals and at bedtime.    Dispense:  420 mL    Refill:  0   lactated ringers  bolus 1,000 mL    ASSESSMENT 1. [redacted] weeks gestation of pregnancy   2. Nausea/vomiting in pregnancy   3. Housing insecurity   4. Acute cystitis without hematuria    Treated for UTI yesterday with 2g Rocephin  yesterday.   2L LR bolus, zofran , scopolamine  patch and haldol  provided today - tolerating PO  Pt reports a place at [facility] starting today.  PLAN Discharge home with strict return precautions. Allergies as of 04/15/2024   No Known Allergies      Medication List     TAKE these medications    Abilify  Maintena 300 MG Prsy prefilled syringe Generic drug:  ARIPiprazole  ER Inject 300 mg into the muscle every 28 (twenty-eight) days.   haloperidol  1 MG tablet Commonly known as: HALDOL  Take 2 tablets (2 mg total) by mouth 3 (three) times daily.   ondansetron  4 MG disintegrating tablet Commonly known as: ZOFRAN -ODT Take 1 tablet (4 mg total) by mouth every 6 (six) hours as needed for nausea.   scopolamine  1 MG/3DAYS Commonly known as: TRANSDERM-SCOP Place 1 patch (1.5 mg total) onto the skin every 3 (three) days. Start taking on: April 18, 2024   sucralfate  1 GM/10ML suspension Commonly known as: CARAFATE  Take 10 mLs (1 g total) by mouth 4 (four) times daily -  with meals and at bedtime.        Follow-up Information     [facility] Follow up.   Specialty: Obstetrics and Gynecology Contact information: [address] [place]  [address] [phone]               Mardy Shropshire, MD FMOB Fellow, Faculty practice [facility], [facility] for [facility]  04/15/2024  11:34 PM

## 2024-04-16 LAB — RUBELLA SCREEN: Rubella: 3.69 {index} (ref >0.99)

## 2024-04-19 ENCOUNTER — Ambulatory Visit: Payer: Self-pay | Admitting: Certified Nurse Midwife

## 2024-04-19 LAB — CULTURE, OB URINE: Culture: >=100000 — AB

## 2024-04-29 ENCOUNTER — Telehealth: Payer: Self-pay

## 2024-04-29 NOTE — Progress Notes (Signed)
 MyChart visit text link sent at 10:13 via Epic. Called patient at 10:15 and 10:30; both calls directly to voicemail. Unable to leave a message due to full voicemail box. MyChart message sent.  Vernell FORBES Ruddle, RN 04/29/2024 10:33 AM

## 2024-05-01 ENCOUNTER — Other Ambulatory Visit: Payer: Self-pay | Admitting: Obstetrics & Gynecology

## 2024-05-01 DIAGNOSIS — Z36 Encounter for antenatal screening for chromosomal anomalies: Secondary | ICD-10-CM

## 2024-05-07 ENCOUNTER — Encounter: Payer: Self-pay | Admitting: Obstetrics & Gynecology

## 2024-05-29 ENCOUNTER — Other Ambulatory Visit: Payer: Self-pay | Admitting: *Deleted

## 2024-05-29 ENCOUNTER — Ambulatory Visit: Payer: Self-pay | Attending: Obstetrics and Gynecology | Admitting: Maternal & Fetal Medicine

## 2024-05-29 ENCOUNTER — Ambulatory Visit (HOSPITAL_BASED_OUTPATIENT_CLINIC_OR_DEPARTMENT_OTHER): Payer: Self-pay

## 2024-05-29 VITALS — BP 103/64 | HR 88

## 2024-05-29 DIAGNOSIS — O99342 Other mental disorders complicating pregnancy, second trimester: Secondary | ICD-10-CM

## 2024-05-29 DIAGNOSIS — O99212 Obesity complicating pregnancy, second trimester: Secondary | ICD-10-CM

## 2024-05-29 DIAGNOSIS — Z8659 Personal history of other mental and behavioral disorders: Secondary | ICD-10-CM

## 2024-05-29 DIAGNOSIS — Z363 Encounter for antenatal screening for malformations: Secondary | ICD-10-CM | POA: Insufficient documentation

## 2024-05-29 DIAGNOSIS — O35EXX0 Maternal care for other (suspected) fetal abnormality and damage, fetal genitourinary anomalies, not applicable or unspecified: Secondary | ICD-10-CM

## 2024-05-29 DIAGNOSIS — E669 Obesity, unspecified: Secondary | ICD-10-CM

## 2024-05-29 DIAGNOSIS — O09892 Supervision of other high risk pregnancies, second trimester: Secondary | ICD-10-CM | POA: Insufficient documentation

## 2024-05-29 DIAGNOSIS — F259 Schizoaffective disorder, unspecified: Secondary | ICD-10-CM | POA: Insufficient documentation

## 2024-05-29 DIAGNOSIS — Z3689 Encounter for other specified antenatal screening: Secondary | ICD-10-CM

## 2024-05-29 DIAGNOSIS — Z36 Encounter for antenatal screening for chromosomal anomalies: Secondary | ICD-10-CM

## 2024-05-29 DIAGNOSIS — Z3A22 22 weeks gestation of pregnancy: Secondary | ICD-10-CM

## 2024-05-29 DIAGNOSIS — Z79899 Other long term (current) drug therapy: Secondary | ICD-10-CM | POA: Insufficient documentation

## 2024-05-29 DIAGNOSIS — O358XX0 Maternal care for other (suspected) fetal abnormality and damage, not applicable or unspecified: Secondary | ICD-10-CM | POA: Insufficient documentation

## 2024-05-29 DIAGNOSIS — O321XX0 Maternal care for breech presentation, not applicable or unspecified: Secondary | ICD-10-CM | POA: Insufficient documentation

## 2024-05-29 DIAGNOSIS — Z7982 Long term (current) use of aspirin: Secondary | ICD-10-CM | POA: Insufficient documentation

## 2024-05-29 DIAGNOSIS — F250 Schizoaffective disorder, bipolar type: Secondary | ICD-10-CM
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
## 2024-06-16 ENCOUNTER — Inpatient Hospital Stay (HOSPITAL_COMMUNITY)
Admission: AD | Admit: 2024-06-16 | Discharge: 2024-06-16 | Disposition: A | Discharge status: Home / Self Care | Payer: Self-pay | Attending: Obstetrics & Gynecology | Admitting: Obstetrics & Gynecology

## 2024-06-16 ENCOUNTER — Other Ambulatory Visit: Payer: Self-pay

## 2024-06-16 ENCOUNTER — Encounter (HOSPITAL_COMMUNITY): Payer: Self-pay | Admitting: Obstetrics & Gynecology

## 2024-06-16 DIAGNOSIS — O26892 Other specified pregnancy related conditions, second trimester: Secondary | ICD-10-CM

## 2024-06-16 DIAGNOSIS — Z3A25 25 weeks gestation of pregnancy: Secondary | ICD-10-CM

## 2024-06-16 DIAGNOSIS — M5432 Sciatica, left side: Secondary | ICD-10-CM

## 2024-06-16 DIAGNOSIS — Z3689 Encounter for other specified antenatal screening: Secondary | ICD-10-CM

## 2024-06-16 LAB — URINALYSIS, ROUTINE W REFLEX MICROSCOPIC
Bilirubin Urine: NEGATIVE
Glucose, UA: NEGATIVE mg/dL
Hgb urine dipstick: NEGATIVE
Ketones, ur: NEGATIVE mg/dL
Leukocytes,Ua: NEGATIVE
Nitrite: NEGATIVE
Protein, ur: NEGATIVE mg/dL
Specific Gravity, Urine: 1.014 (ref 1.005–1.030)
pH: 6 (ref 5.0–8.0)

## 2024-06-16 MED ORDER — CYCLOBENZAPRINE HCL 10 MG PO TABS: 10.0000 mg | ORAL_TABLET | Freq: Three times a day (TID) | ORAL | 1 refills | Status: DC | PRN

## 2024-06-16 NOTE — Discharge Instructions (Signed)
  Jolena Greenspan w/ [facility]   (At [name]) [address] [place] [phone] [email or web address]

## 2024-06-16 NOTE — MAU Note (Signed)
 Melissa [name] is a 28 y.o. at [redacted]w[redacted]d here in MAU reporting: left leg pain- nerve- for the last month. Sitting up right makes it better- . Pain starts on butt check and shoots down my leg NO c/o SROM, vaginal bleeding, contractions, HA, chest pain, or visual disturbances.  +FM  Pt reports having a nose bleed this morning.  LMP: - Onset of complaint: 1 month Pain score: 5/10 Vitals:   06/16/24 1049  BP: 118/68  Pulse: (!) 107  Resp: 16  Temp: 98.9 F (37.2 C)  SpO2: 99%     FHT: deferred to room- pt in dress  Lab orders placed from triage: ua

## 2024-06-16 NOTE — MAU Provider Note (Addendum)
 History   CSN: [id number]  Arrival date and time: 06/16/24 1032   Event Date/Time   First Provider Initiated Contact with Patient 06/16/24 1135     Chief Complaint  Patient presents with   left leg pain    nerve   Melissa [name] [redacted]w[redacted]d w/PMHx of sciatica in pregnancy who presents to the MAU for sciatica exacerbation. Patient states she has been having sharp pain that begin in the middle of her L buttock and shoot down her leg, worse with walking and better with sitting. She was diagnosed with L sided sciatica in pregnancy by her PCP and told to start prenatal yoga. The patient has not started prenatal yoga and is requesting a belly band to lift the baby off the nerve. She denies back pain, radiation of pain anywhere else besides the L leg, coldness of the legs, calf pain, gait abnormality, CP, SOB, palpitations, saddle anesthesia, urinary or bowel incontinence, or any other complaints at this time.    OB History     Gravida  5   Para  2   Term  2   Preterm  0   AB  2   Living  2      SAB  2   IAB  0   Ectopic  0   Multiple  0   Live Births  2          Past Medical History:  Diagnosis Date   Anxiety    Asthma    Blow out fracture of orbit (HCC) 01/30/2021   Chlamydia    Collapsed lung 06/11/2020   Depression    Dizziness    Fetal growth restriction antepartum 04/23/2021   Gonorrhea    Pneumothorax, traumatic 06/12/2020   PTSD (post-traumatic stress disorder)    Rib fracture    Schizoaffective disorder (HCC)    Syncope    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 06/27/2022   Past Surgical History:  Procedure Laterality Date   chest tube  06/11/2020   Family History  Problem Relation Age of Onset   Asthma Mother    Hypertension Father    Healthy Brother    Healthy Maternal Grandmother    Healthy Maternal Grandfather    Healthy Paternal Grandmother    Healthy Paternal Grandfather    Cancer Neg Hx    Diabetes  Neg Hx    Heart disease Neg Hx    Stroke Neg Hx    Social History   Tobacco Use   Smoking status: Never   Smokeless tobacco: Never  Vaping Use   Vaping status: Never Used  Substance Use Topics   Alcohol use: Not Currently    Comment: a few sips every few weeks   Drug use: Not Currently    Types: Marijuana    Comment: once weekly   Allergies: No Known Allergies  No medications prior to admission.   Review of Systems  Constitutional: Negative.   HENT: Negative.    Respiratory: Negative.    Cardiovascular: Negative.   Gastrointestinal: Negative.   Genitourinary: Negative.   Musculoskeletal:        Radiculopathy from L buttock down to L foot  Skin: Negative.   Neurological: Negative.    Physical Exam   Blood pressure 109/66, pulse (!) 104, temperature 98.9 F (37.2 C), temperature source Oral, resp. rate 16, height 5' 7 (1.702 m), weight 86.8 kg, last menstrual period 12/22/2023, SpO2 99%, unknown if currently breastfeeding.  Physical Exam Constitutional:      General: She is not in acute distress.    Appearance: Normal appearance.  HENT:     Head: Normocephalic and atraumatic.     Nose: Nose normal.     Mouth/Throat:     Mouth: Mucous membranes are moist.  Eyes:     Extraocular Movements: Extraocular movements intact.     Conjunctiva/sclera: Conjunctivae normal.  Cardiovascular:     Rate and Rhythm: Normal rate and regular rhythm.     Pulses: Normal pulses.     Heart sounds: Normal heart sounds.  Pulmonary:     Effort: Pulmonary effort is normal. No respiratory distress.     Breath sounds: Normal breath sounds. No wheezing, rhonchi or rales.  Abdominal:     General: Abdomen is flat. There is no distension.     Palpations: Abdomen is soft.     Tenderness: There is no abdominal tenderness. There is no guarding or rebound.  Musculoskeletal:        General: No swelling or tenderness.     Cervical back: Normal range of motion and neck supple.     Right  lower leg: No edema.     Left lower leg: No edema.     Comments: B/l hip ROM decreased secondary to pain, + straight leg raise on the L, - straight leg raise on the R  Skin:    General: Skin is warm and dry.     Capillary Refill: Capillary refill takes less than 2 seconds.     Findings: No bruising, erythema or rash.  Neurological:     General: No focal deficit present.     Mental Status: She is alert and oriented to person, place, and time. Mental status is at baseline.     Cranial Nerves: No cranial nerve deficit.     Sensory: No sensory deficit.     Motor: No weakness.  Psychiatric:        Mood and Affect: Mood normal.        Behavior: Behavior normal.    MAU Course  MDM Low  MAU Course Discussed importance of stretching and movement for relief of sciatica, offered referral to PT/aquatic therapy and gave info on local chiropractor. Also suggested flexeril  at night. Advised purchase of a belly band from a local store or [employer].  Assessment and Plan  1. Sciatic pain, left (Primary) - Discharge patient - Ambulatory referral to Physical Therapy  2. NST (non-stress test) reactive  3. [redacted] weeks gestation of pregnancy   Follow up at [facility] as scheduled for ongoing prenatal care.   Future Appointments  Date Time Provider Department Center  07/01/2024  1:15 PM [facility] PROVIDER 1 [facility]  07/01/2024  1:30 PM [facility] US4 [facility]    Allergies as of 06/16/2024   No Known Allergies      Medication List     TAKE these medications    Abilify  Maintena 300 MG Prsy prefilled syringe Generic drug: ARIPiprazole  ER Inject 300 mg into the muscle every 28 (twenty-eight) days.   aspirin EC 81 MG tablet Take 81 mg by mouth daily. Swallow whole.   cyclobenzaprine  10 MG tablet Commonly known as: FLEXERIL  Take 1 tablet (10 mg total) by mouth every 8 (eight) hours as needed for muscle spasms.   haloperidol  1 MG tablet Commonly known as: HALDOL  Take 2 tablets (2 mg total) by  mouth 3 (three) times daily.   IRON PO Take by mouth.   ondansetron   4 MG disintegrating tablet Commonly known as: ZOFRAN -ODT Take 1 tablet (4 mg total) by mouth every 6 (six) hours as needed for nausea.   prenatal multivitamin Tabs tablet Take 1 tablet by mouth daily at 12 noon.   scopolamine  1 MG/3DAYS Commonly known as: TRANSDERM-SCOP Place 1 patch (1.5 mg total) onto the skin every 3 (three) days.   sucralfate  1 GM/10ML suspension Commonly known as: CARAFATE  Take 10 mLs (1 g total) by mouth 4 (four) times daily -  with meals and at bedtime.        Camie Dixons, DO 06/16/2024, 12:01 PM   Attestation of Supervision of Student:  I confirm that I have verified the information documented in the medical student's note and that I have also personally supervised the history, physical exam and all medical decision making activities.  I have verified that all services and findings are accurately documented in this student's note; and I agree with management and plan as outlined in the documentation. I have also made any necessary editorial changes.  Cornell JONELLE Finder, CNM [facility] [employer], [facility] 06/16/2024 6:11 PM

## 2024-07-01 ENCOUNTER — Ambulatory Visit: Payer: Self-pay

## 2024-07-01 ENCOUNTER — Ambulatory Visit: Payer: Self-pay | Attending: Maternal & Fetal Medicine

## 2024-07-01 DIAGNOSIS — Z3A27 27 weeks gestation of pregnancy: Secondary | ICD-10-CM | POA: Insufficient documentation

## 2024-07-01 DIAGNOSIS — O99212 Obesity complicating pregnancy, second trimester: Secondary | ICD-10-CM | POA: Insufficient documentation

## 2024-07-01 DIAGNOSIS — F259 Schizoaffective disorder, unspecified: Secondary | ICD-10-CM | POA: Insufficient documentation

## 2024-07-01 DIAGNOSIS — E669 Obesity, unspecified: Secondary | ICD-10-CM

## 2024-07-01 DIAGNOSIS — O99342 Other mental disorders complicating pregnancy, second trimester: Secondary | ICD-10-CM | POA: Insufficient documentation

## 2024-07-01 DIAGNOSIS — O358XX0 Maternal care for other (suspected) fetal abnormality and damage, not applicable or unspecified: Secondary | ICD-10-CM | POA: Insufficient documentation

## 2024-07-01 DIAGNOSIS — O09892 Supervision of other high risk pregnancies, second trimester: Secondary | ICD-10-CM | POA: Insufficient documentation

## 2024-08-02 ENCOUNTER — Encounter (HOSPITAL_COMMUNITY): Payer: Self-pay | Admitting: Obstetrics and Gynecology

## 2024-08-02 ENCOUNTER — Other Ambulatory Visit: Payer: Self-pay

## 2024-08-02 ENCOUNTER — Inpatient Hospital Stay (HOSPITAL_COMMUNITY)
Admission: AD | Admit: 2024-08-02 | Discharge: 2024-08-02 | Disposition: A | Discharge status: Home / Self Care | Payer: Self-pay | Attending: Obstetrics and Gynecology | Admitting: Obstetrics and Gynecology

## 2024-08-02 DIAGNOSIS — O23593 Infection of other part of genital tract in pregnancy, third trimester: Secondary | ICD-10-CM

## 2024-08-02 DIAGNOSIS — Z3A32 32 weeks gestation of pregnancy: Secondary | ICD-10-CM

## 2024-08-02 DIAGNOSIS — A5901 Trichomonal vulvovaginitis: Secondary | ICD-10-CM

## 2024-08-02 DIAGNOSIS — N898 Other specified noninflammatory disorders of vagina: Secondary | ICD-10-CM

## 2024-08-02 DIAGNOSIS — Z3689 Encounter for other specified antenatal screening: Secondary | ICD-10-CM

## 2024-08-02 DIAGNOSIS — N3000 Acute cystitis without hematuria: Secondary | ICD-10-CM

## 2024-08-02 LAB — URINALYSIS, ROUTINE W REFLEX MICROSCOPIC
Bilirubin Urine: NEGATIVE
Glucose, UA: NEGATIVE mg/dL
Hgb urine dipstick: NEGATIVE
Ketones, ur: NEGATIVE mg/dL
Nitrite: NEGATIVE
Protein, ur: 30 mg/dL — AB
Specific Gravity, Urine: 1.017 (ref 1.005–1.030)
pH: 5 (ref 5.0–8.0)

## 2024-08-02 LAB — WET PREP, GENITAL
Clue Cells Wet Prep HPF POC: NONE SEEN
Sperm: NONE SEEN
WBC, Wet Prep HPF POC: <10 (ref <10)
Yeast Wet Prep HPF POC: NONE SEEN

## 2024-08-02 MED ORDER — MICONAZOLE NITRATE 2 % VA CREA: 1.0000 | TOPICAL_CREAM | Freq: Every day | VAGINAL | 0 refills | Status: DC

## 2024-08-02 MED ORDER — NYSTATIN-TRIAMCINOLONE 100000-0.1 UNIT/GM-% EX CREA: TOPICAL_CREAM | CUTANEOUS | 0 refills | Status: DC

## 2024-08-02 MED ORDER — METRONIDAZOLE 500 MG PO TABS: 500.0000 mg | ORAL_TABLET | Freq: Two times a day (BID) | ORAL | 0 refills | Status: DC

## 2024-08-02 MED ORDER — NITROFURANTOIN MONOHYD MACRO 100 MG PO CAPS: 100.0000 mg | ORAL_CAPSULE | Freq: Two times a day (BID) | ORAL | 0 refills | Status: DC

## 2024-08-02 NOTE — MAU Provider Note (Signed)
 Chief Complaint:  Vaginal Itching and Vaginal Discharge   HPI   None     Melissa [name] is a 28 y.o. [id number] at [redacted]w[redacted]d who presents to maternity admissions reporting vaginal itching and discharge since previous Monday. She reports the discharge is yellow/tan in color and thick. She denies VB LOF. Endorses regular and vigorous fetal movement. She reports wiping makes itching worse and she is experiencing burning with urination.   She reports she is no longer with FOB and not in contact with him.   Pregnancy Course: [facility]  Past Medical History:  Diagnosis Date   Anxiety    Asthma    Blow out fracture of orbit (HCC) 01/30/2021   Chlamydia    Collapsed lung 06/11/2020   Depression    Dizziness    Fetal growth restriction antepartum 04/23/2021   Gonorrhea    Pneumothorax, traumatic 06/12/2020   PTSD (post-traumatic stress disorder)    Rib fracture    Schizoaffective disorder (HCC)    Syncope    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 06/27/2022   OB History  Gravida Para Term Preterm AB Living  5 2 2  0 2 2  SAB IAB Ectopic Multiple Live Births  2 0 0 0 2    # Outcome Date GA Lbr Len/2nd Weight Sex Type Anes PTL Lv  5 Current           4 SAB 04/10/23 [redacted]w[redacted]d         3 Term 11/01/22 [redacted]w[redacted]d / 00:07 2310 g F Vag-Spont EPI  LIV  2 Term 04/25/21 [redacted]w[redacted]d  2523 g F Vag-Spont  N LIV  1 SAB 2014 [redacted]w[redacted]d          Past Surgical History:  Procedure Laterality Date   chest tube  06/11/2020   Family History  Problem Relation Age of Onset   Asthma Mother    Hypertension Father    Healthy Brother    Healthy Maternal Grandmother    Healthy Maternal Grandfather    Healthy Paternal Grandmother    Healthy Paternal Grandfather    Cancer Neg Hx    Diabetes Neg Hx    Heart disease Neg Hx    Stroke Neg Hx    Social History   Tobacco Use   Smoking status: Never   Smokeless tobacco: Never  Vaping Use   Vaping status: Never Used  Substance Use Topics   Alcohol use:  Not Currently    Comment: a few sips every few weeks   Drug use: Not Currently    Types: Marijuana    Comment: once weekly   No Known Allergies Medications Prior to Admission  Medication Sig Dispense Refill Last Dose/Taking   aspirin EC 81 MG tablet Take 81 mg by mouth daily. Swallow whole.   08/01/2024   Ferrous Sulfate  (IRON PO) Take by mouth.   08/01/2024   Prenatal Vit-Fe Fumarate-FA (PRENATAL MULTIVITAMIN) TABS tablet Take 1 tablet by mouth daily at 12 noon.   08/01/2024   ARIPiprazole  ER (ABILIFY  MAINTENA) 300 MG PRSY prefilled syringe Inject 300 mg into the muscle every 28 (twenty-eight) days. (Patient not taking: Reported on 05/29/2024) 1 each 11    cyclobenzaprine  (FLEXERIL ) 10 MG tablet Take 1 tablet (10 mg total) by mouth every 8 (eight) hours as needed for muscle spasms. 30 tablet 1    haloperidol  (HALDOL ) 1 MG tablet Take 2 tablets (2 mg total) by mouth 3 (three) times daily. (Patient not taking: Reported on 05/29/2024)  90 tablet 2    ondansetron  (ZOFRAN -ODT) 4 MG disintegrating tablet Take 1 tablet (4 mg total) by mouth every 6 (six) hours as needed for nausea. (Patient not taking: Reported on 05/29/2024) 20 tablet 0    scopolamine  (TRANSDERM-SCOP) 1 MG/3DAYS Place 1 patch (1.5 mg total) onto the skin every 3 (three) days. (Patient not taking: Reported on 05/29/2024) 10 patch 12    sucralfate  (CARAFATE ) 1 GM/10ML suspension Take 10 mLs (1 g total) by mouth 4 (four) times daily -  with meals and at bedtime. (Patient not taking: Reported on 05/29/2024) 420 mL 0     I have reviewed patient's Past Medical Hx, Surgical Hx, Family Hx, Social Hx, medications and allergies.   ROS  Pertinent items noted in HPI and remainder of comprehensive ROS otherwise negative.   PHYSICAL EXAM  Patient Vitals for the past 24 hrs:  BP Temp Temp src Pulse Resp SpO2 Height Weight  08/02/24 1220 102/66 -- -- (!) 102 18 99 % -- --  08/02/24 1138 112/74 98.5 F (36.9 C) Oral (!) 120 17 97 % 5' 7 (1.702 m)  87.6 kg    Physical Exam Vitals and nursing note reviewed.  Constitutional:      General: She is not in acute distress.    Appearance: Normal appearance. She is not ill-appearing, toxic-appearing or diaphoretic.  HENT:     Head: Normocephalic.  Cardiovascular:     Rate and Rhythm: Normal rate.     Pulses: Normal pulses.  Pulmonary:     Effort: Pulmonary effort is normal.  Abdominal:     Comments: Gravid  Skin:    General: Skin is warm and dry.     Capillary Refill: Capillary refill takes less than 2 seconds.  Neurological:     General: No focal deficit present.     Mental Status: She is alert and oriented to person, place, and time.  Psychiatric:        Mood and Affect: Mood normal.        Behavior: Behavior normal.        Thought Content: Thought content normal.        Judgment: Judgment normal.         Fetal Tracing: Baseline: 125 Variability:moderate Accelerations: 15x15 Decelerations:absent  Toco: UI   Labs: Results for orders placed or performed during the hospital encounter of 08/02/24 (from the past 24 hours)  Wet prep, genital     Status: Abnormal   Collection Time: 08/02/24 11:53 AM  Result Value Ref Range   Yeast Wet Prep HPF POC NONE SEEN NONE SEEN   Trich, Wet Prep PRESENT (A) NONE SEEN   Clue Cells Wet Prep HPF POC NONE SEEN NONE SEEN   WBC, Wet Prep HPF POC <10 <10   Sperm NONE SEEN   Urinalysis, Routine w reflex microscopic -Urine, Clean Catch     Status: Abnormal   Collection Time: 08/02/24 11:53 AM  Result Value Ref Range   Color, Urine AMBER (A) YELLOW   APPearance CLOUDY (A) CLEAR   Specific Gravity, Urine 1.017 1.005 - 1.030   pH 5.0 5.0 - 8.0   Glucose, UA NEGATIVE NEGATIVE mg/dL   Hgb urine dipstick NEGATIVE NEGATIVE   Bilirubin Urine NEGATIVE NEGATIVE   Ketones, ur NEGATIVE NEGATIVE mg/dL   Protein, ur 30 (A) NEGATIVE mg/dL   Nitrite NEGATIVE NEGATIVE   Leukocytes,Ua MODERATE (A) NEGATIVE   RBC / HPF 21-50 0 - 5 RBC/hpf   WBC, UA  11-20 0 -  5 WBC/hpf   Bacteria, UA RARE (A) NONE SEEN   Squamous Epithelial / HPF 0-5 0 - 5 /HPF   Mucus PRESENT     Imaging:  No results found.  MDM & MAU COURSE  MDM:  Moderate  MAU Course:  Evaluate for vaginal infection and UTI due to symptoms reported. Wet prep confirmatory for trich. UA suspicious for cystitis, culture ordered.   Orders Placed This Encounter  Procedures   Wet prep, genital   Culture, OB Urine   Urinalysis, Routine w reflex microscopic -Urine, Clean Catch   Discharge patient   Meds ordered this encounter  Medications   nitrofurantoin , macrocrystal-monohydrate, (MACROBID ) 100 MG capsule    Sig: Take 1 capsule (100 mg total) by mouth 2 (two) times daily.    Dispense:  10 capsule    Refill:  0    Supervising Provider:   NICHOLAUS BURNARD HERO [[id number]]   metroNIDAZOLE  (FLAGYL ) 500 MG tablet    Sig: Take 1 tablet (500 mg total) by mouth 2 (two) times daily.    Dispense:  14 tablet    Refill:  0    Supervising Provider:   DAVIS, KELLY M [[id number]]   miconazole  (MONISTAT  7) 2 % vaginal cream    Sig: Place 1 Applicatorful vaginally at bedtime. Apply for seven nights    Dispense:  315 g    Refill:  0    Supervising Provider:   NICHOLAUS BURNARD HERO [[id number]]   nystatin -triamcinolone  (MYCOLOG II) cream    Sig: Apply to outside of vagina area daily    Dispense:  15 g    Refill:  0    Supervising Provider:   PRATT, TANYA S [[id number]]    ASSESSMENT   1. Vaginal pruritus   2. Trichomonal vaginitis during pregnancy in third trimester   3. [redacted] weeks gestation of pregnancy   4. NST (non-stress test) reactive   5. Acute cystitis without hematuria     PLAN  Discharge home in stable condition. Return precautions advised.  - Metronidazole  prescribed for trichomoniasis 500mg  BID for 7 days as below. Offer for expedited partner treatment declined due to patient no longer in contact with former partner. ID RN to reach out per protocol.  - UA suspicious for UTI, especially in  light of symptoms. Macrobid  BID for 5 days as below, culture pending.  - Miconazole  PV ordered prophylactically to be started after patient finishing antibiotics in case of subsequent vaginal candidiasis.  - Information on doula programs provided per patient request.  Follow up at [facility] for [facility].  Allergies as of 08/02/2024   No Known Allergies      Medication List     STOP taking these medications    haloperidol  1 MG tablet Commonly known as: HALDOL        TAKE these medications    Abilify  Maintena 300 MG Prsy prefilled syringe Generic drug: ARIPiprazole  ER Inject 300 mg into the muscle every 28 (twenty-eight) days.   aspirin EC 81 MG tablet Take 81 mg by mouth daily. Swallow whole.   cyclobenzaprine  10 MG tablet Commonly known as: FLEXERIL  Take 1 tablet (10 mg total) by mouth every 8 (eight) hours as needed for muscle spasms.   IRON PO Take by mouth.   metroNIDAZOLE  500 MG tablet Commonly known as: FLAGYL  Take 1 tablet (500 mg total) by mouth 2 (two) times daily.   miconazole  2 % vaginal cream Commonly known as: MONISTAT  7 Place 1 Applicatorful vaginally at bedtime. Apply for  seven nights   nitrofurantoin  (macrocrystal-monohydrate) 100 MG capsule Commonly known as: MACROBID  Take 1 capsule (100 mg total) by mouth 2 (two) times daily.   nystatin -triamcinolone  cream Commonly known as: MYCOLOG II Apply to outside of vagina area daily   ondansetron  4 MG disintegrating tablet Commonly known as: ZOFRAN -ODT Take 1 tablet (4 mg total) by mouth every 6 (six) hours as needed for nausea.   prenatal multivitamin Tabs tablet Take 1 tablet by mouth daily at 12 noon.   scopolamine  1 MG/3DAYS Commonly known as: TRANSDERM-SCOP Place 1 patch (1.5 mg total) onto the skin every 3 (three) days.   sucralfate  1 GM/10ML suspension Commonly known as: CARAFATE  Take 10 mLs (1 g total) by mouth 4 (four) times daily -  with meals and at bedtime.        Camie Rote, MSN, CNM 08/02/2024 1:50 PM  Certified Nurse Midwife, [name]

## 2024-08-02 NOTE — Discharge Instructions (Addendum)
 Options for Doula Care in the [place]  As you review your birthing options, consider having a birth doula. A doula is trained to provide support before, during and just after you give birth. There are also postpartum doulas that help you adjust to new parenthood.  While doulas do not provide medical care, they do provide emotional, physical and educational support. A few months before your baby arrives, doulas can help answer questions, ease concerns and help you create and support your birthing plan.    Doulas can help reduce your stress and comfort you and your partner. They can help you cope with labor by helping you use breathing techniques, massage, creative labor positioning, essential oils and affirmations.   Studies show that the benefits of having a doula include:   A more positive birth experience  Fewer requests for pain-relief medication  Less likelihood of cesarean section, commonly called a c-section   Doulas are typically hired via a advertising account planner between you and the doula. We are happy to provide a list of the most active doulas in the area, all of whom are credentialed by [facility] and will not count as a visitor at your birth.  There are several options for no-cost doula care at our hospital, including: Ms. Obeirne,  It was a pleasure meeting you in MAU. It was determined that you have an STI called Trichomoniasis. I'm sending an antibiotic to treat this. It is important that you finish all the medication, even if you start to feel better. You also have a urinary tract infection. I am senging in an antibiotic to treat this as well. Because I have to prescribe two different antibiotics, I am concerned that you might get a vaginal yeast infection. For this reason, I am prescribing a medication for you to use vaginally after you finish the antibiotics.    Here is the information about Doula care for free.  [facility] Volunteer [employer] Program Every [employer]  Program [email or web address] For more information on these programs or to receive a list of doulas active in our area, please email [email or web address]   Thank you for trusting us  to care for you, Camie, Midwife

## 2024-08-02 NOTE — MAU Note (Signed)
 Melissa [name] is a 28 y.o. at [redacted]w[redacted]d here in MAU reporting: vaginal itching and yellow/tan thick mucus discharge that started on Monday. Denies any vaginal bleeding or pain. States there is burning with urination. Painful with wiping and makes the itching worse. Last sexual intercourse Monday.   Onset of complaint: Monday  Pain score: 5 Vitals:   08/02/24 1138  BP: 112/74  Pulse: (!) 120  Resp: 17  Temp: 98.5 F (36.9 C)  SpO2: 97%     FHT:144 Lab orders placed from triage:  vaginal swabs

## 2024-08-03 LAB — GC/CHLAMYDIA PROBE AMP (~~LOC~~) NOT AT ARMC
Chlamydia: NEGATIVE
Comment: NEGATIVE
Comment: NORMAL
Neisseria Gonorrhea: NEGATIVE

## 2024-08-03 LAB — CULTURE, OB URINE: Culture: 80000 — AB

## 2024-08-04 ENCOUNTER — Encounter: Payer: Self-pay | Admitting: Obstetrics and Gynecology

## 2024-08-04 DIAGNOSIS — R8271 Bacteriuria: Secondary | ICD-10-CM | POA: Insufficient documentation

## 2024-08-05 ENCOUNTER — Other Ambulatory Visit: Payer: Self-pay | Admitting: Certified Nurse Midwife

## 2024-08-05 DIAGNOSIS — R8271 Bacteriuria: Secondary | ICD-10-CM

## 2024-08-11 ENCOUNTER — Ambulatory Visit (HOSPITAL_BASED_OUTPATIENT_CLINIC_OR_DEPARTMENT_OTHER): Payer: Self-pay | Admitting: Physical Therapy

## 2024-08-18 ENCOUNTER — Encounter: Payer: Self-pay | Admitting: Family Medicine

## 2024-08-18 ENCOUNTER — Other Ambulatory Visit: Payer: Self-pay

## 2024-08-18 ENCOUNTER — Observation Stay
Admission: EM | Admit: 2024-08-18 | Discharge: 2024-08-18 | Disposition: A | Discharge status: Home / Self Care | Payer: Self-pay | Attending: Family Medicine | Admitting: Family Medicine

## 2024-08-18 DIAGNOSIS — O99513 Diseases of the respiratory system complicating pregnancy, third trimester: Secondary | ICD-10-CM | POA: Insufficient documentation

## 2024-08-18 DIAGNOSIS — R040 Epistaxis: Secondary | ICD-10-CM | POA: Insufficient documentation

## 2024-08-18 DIAGNOSIS — Z3A34 34 weeks gestation of pregnancy: Secondary | ICD-10-CM | POA: Insufficient documentation

## 2024-08-18 DIAGNOSIS — R8271 Bacteriuria: Secondary | ICD-10-CM

## 2024-08-18 DIAGNOSIS — O368130 Decreased fetal movements, third trimester, not applicable or unspecified: Principal | ICD-10-CM | POA: Diagnosis present

## 2024-08-18 MED ORDER — ACETAMINOPHEN 500 MG PO TABS: 1000.0000 mg | ORAL_TABLET | Freq: Four times a day (QID) | ORAL | Status: DC | PRN

## 2024-08-18 MED ORDER — SOD CITRATE-CITRIC ACID 500-334 MG/5ML PO SOLN: 30.0000 mL | ORAL | Status: DC | PRN

## 2024-08-18 MED ORDER — LACTATED RINGERS IV SOLN: 500.0000 mL | INTRAVENOUS | Status: DC | PRN

## 2024-08-18 MED ORDER — ONDANSETRON HCL 4 MG/2ML IJ SOLN: 4.0000 mg | Freq: Four times a day (QID) | INTRAMUSCULAR | Status: DC | PRN

## 2024-08-18 NOTE — Discharge Summary (Signed)
 LABOR & DELIVERY OB TRIAGE NOTE  SUBJECTIVE  HPI Melissa [name] is a 28 y.o. [id number] at [redacted]w[redacted]d who presents to Labor & Delivery for decreased fetal movement. She woke at 430 with a nosebleed, this resolved after a few minutes. After being awake she noted her baby was not moving as usual, she reports that she has not felt much movement since Sunday. She was seen for her prenatal visit at [facility] department, she reports she did not report to them the decreased movement. While in triage she reports feeling movements.  OB History     Gravida  5   Para  2   Term  2   Preterm  0   AB  2   Living  2      SAB  2   IAB  0   Ectopic  0   Multiple  0   Live Births  2           Scheduled Meds: Continuous Infusions:  lactated ringers      PRN Meds:.acetaminophen , lactated ringers , ondansetron , sodium citrate-citric acid   OBJECTIVE  BP 110/68 (BP Location: Right Arm)   Pulse 78   Temp 97.9 F (36.6 C) (Oral)   Resp 18   Ht 5' 7 (1.702 m)   Wt 88.5 kg   LMP 12/22/2023   BMI 30.54 kg/m   General: A&Ox4, NAD Heart: regular rate Lungs: normal work of breathing Abdomen: gravid, soft, nontender. Cervical exam:     NST I reviewed the NST and it was reactive.  Baseline: 135 Variability: moderate Accelerations: present Decelerations:none Toco: contraction x1 noted Category I  No results found for this or any previous visit (from the past 24 hours).  No results found.  ASSESSMENT Impression  1) Pregnancy at [id number], [redacted]w[redacted]d, Estimated Date of Delivery: 09/27/24 2) Reassuring maternal/fetal status 3) Decreased fetal movement, movement felt in triage 4) nosebleed, resolved prior to arrival  PLAN Discharge home with preterm labor & fetal movement precautions. Reviewed RNST with Melissa [name], monitoring fetal movement, encouraged increased hydration. Maintain next appointment as scheduled at [facility]. Melissa [name], CNM 08/18/2024  5:33 AM

## 2024-08-18 NOTE — OB Triage Note (Signed)
 Pt is a G5P2 at [redacted]w[redacted]d gestation reporting to l/d triage due to reports of cramping and decreased fetal movement.  She reports that the cramps started yesterday evening around 5pm in her lower abdomen. She rates the cramps 5-7/10 and states that they are infrequent. She reports that she did not notice cramping throughout the night but when awakened by a nose bleed about 0430 she noticed the cramping restarted.  She reports that she has not felt her baby move since Sunday.  Denies VB/LOF  VSS Monitors applied and assessing with initial fht 145 at 0451.  Harlene Cisco CNM aware of pt arrival to the unit.

## 2024-08-18 NOTE — OB Triage Note (Signed)
 Pt is being discharged stable and ambulatory. Pt educated on red flags signs to return such as LOF, VB, CTX, and decreased fetal movement. Discharge instructions provided and reviewed with the pt. Pt verbalizes understanding.  Pt discharged home per provider order.

## 2024-08-27 ENCOUNTER — Other Ambulatory Visit: Payer: Self-pay

## 2024-08-27 ENCOUNTER — Observation Stay
Admission: EM | Admit: 2024-08-27 | Discharge: 2024-08-27 | Disposition: A | Discharge status: Home / Self Care | Payer: Self-pay | Attending: Certified Nurse Midwife | Admitting: Certified Nurse Midwife

## 2024-08-27 DIAGNOSIS — Z3A35 35 weeks gestation of pregnancy: Secondary | ICD-10-CM | POA: Insufficient documentation

## 2024-08-27 DIAGNOSIS — R102 Pelvic and perineal pain unspecified side: Principal | ICD-10-CM | POA: Insufficient documentation

## 2024-08-27 DIAGNOSIS — N898 Other specified noninflammatory disorders of vagina: Secondary | ICD-10-CM

## 2024-08-27 DIAGNOSIS — O26893 Other specified pregnancy related conditions, third trimester: Principal | ICD-10-CM | POA: Insufficient documentation

## 2024-08-27 LAB — URINALYSIS, ROUTINE W REFLEX MICROSCOPIC
Bilirubin Urine: NEGATIVE
Glucose, UA: NEGATIVE mg/dL
Hgb urine dipstick: NEGATIVE
Ketones, ur: NEGATIVE mg/dL
Nitrite: NEGATIVE
Protein, ur: NEGATIVE mg/dL
Specific Gravity, Urine: 1.005 (ref 1.005–1.030)
pH: 6 (ref 5.0–8.0)

## 2024-08-27 LAB — WET PREP, GENITAL
Clue Cells Wet Prep HPF POC: NONE SEEN
Sperm: NONE SEEN
Trich, Wet Prep: NONE SEEN
WBC, Wet Prep HPF POC: <10
Yeast Wet Prep HPF POC: NONE SEEN

## 2024-08-27 MED ORDER — ACETAMINOPHEN 500 MG PO TABS: 1000.0000 mg | ORAL_TABLET | Freq: Four times a day (QID) | ORAL | Status: AC | PRN

## 2024-08-27 MED ORDER — SOD CITRATE-CITRIC ACID 500-334 MG/5ML PO SOLN: 30.0000 mL | ORAL | Status: DC | PRN

## 2024-08-27 MED ORDER — ONDANSETRON HCL 4 MG/2ML IJ SOLN: 4.0000 mg | Freq: Four times a day (QID) | INTRAMUSCULAR | Status: DC | PRN

## 2024-08-27 MED ORDER — LACTATED RINGERS IV SOLN: 500.0000 mL | INTRAVENOUS | Status: DC | PRN

## 2024-08-27 MED ORDER — ACETAMINOPHEN 500 MG PO TABS: 1000.0000 mg | ORAL_TABLET | Freq: Four times a day (QID) | ORAL | Status: DC | PRN

## 2024-08-27 NOTE — OB Triage Note (Signed)
 The patient is a G5P2 at 35wks 4days presenting to [facility] triage for vaginal pressure. Patient states she feels a scraping and felt something inside her vagina. Patient reports positive fetal movement; denies any vaginal bleeding or leaking of fluid. Patient denies any itching or burning. Vital signs within normal limits.

## 2024-08-27 NOTE — Discharge Summary (Signed)
 LABOR & DELIVERY OB TRIAGE NOTE  SUBJECTIVE  HPI Melissa [name] is a 29 y.o. [id number] at [redacted]w[redacted]d who presents to Labor & Delivery for vaginal pressure and sharp scraping feeling in vagina. While having a bowel movement and bearing down she felt more pressure vaginally, she felt inside her vagina and felt like there was something in her vagina. She denies contractions, loss of fluid, vaginal bleeding or change to discharge. The scraping feeling she describes as a lightning sharp pain that resolves quickly. She is concerned for preterm labor.  OB History     Gravida  5   Para  2   Term  2   Preterm  0   AB  2   Living  2      SAB  2   IAB  0   Ectopic  0   Multiple  0   Live Births  2           Scheduled Meds: Continuous Infusions:  lactated ringers      PRN Meds:.acetaminophen , lactated ringers , ondansetron , sodium citrate-citric acid   OBJECTIVE  BP 113/69   Pulse 81   LMP 12/22/2023   General: A&Ox4, NAD Heart: regular rate Lungs: normal work of breathing Abdomen: soft, gravid, nontender Cervical exam: Dilation: Fingertip Effacement (%): 0 Cervical Position: Posterior Station: -3 Exam by:: J. Nova Schmuhl, CNM   NST I reviewed the NST and it was reactive.  Baseline: 140 Variability: moderate Accelerations: present Decelerations: absent Toco: quiet, soft resting tone Category I  Results for orders placed or performed during the hospital encounter of 08/27/24 (from the past 24 hours)  Urinalysis, Routine w reflex microscopic -Urine, Clean Catch     Status: Abnormal   Collection Time: 08/27/24  7:06 PM  Result Value Ref Range   Color, Urine YELLOW (A) YELLOW   APPearance CLEAR (A) CLEAR   Specific Gravity, Urine 1.005 1.005 - 1.030   pH 6.0 5.0 - 8.0   Glucose, UA NEGATIVE NEGATIVE mg/dL   Hgb urine dipstick NEGATIVE NEGATIVE   Bilirubin Urine NEGATIVE NEGATIVE   Ketones, ur NEGATIVE NEGATIVE mg/dL   Protein, ur NEGATIVE NEGATIVE mg/dL    Nitrite NEGATIVE NEGATIVE   Leukocytes,Ua TRACE (A) NEGATIVE   RBC / HPF 0-5 0 - 5 RBC/hpf   WBC, UA 0-5 0 - 5 WBC/hpf   Bacteria, UA RARE (A) NONE SEEN   Squamous Epithelial / HPF 0-5 0 - 5 /HPF  Wet prep, genital     Status: None   Collection Time: 08/27/24  7:32 PM  Result Value Ref Range   Yeast Wet Prep HPF POC NONE SEEN NONE SEEN   Trich, Wet Prep NONE SEEN NONE SEEN   Clue Cells Wet Prep HPF POC NONE SEEN NONE SEEN   WBC, Wet Prep HPF POC <10 <10   Sperm NONE SEEN     No results found.  ASSESSMENT Impression  1) Pregnancy at [id number], [redacted]w[redacted]d, Estimated Date of Delivery: 09/27/24 2) Reassuring maternal/fetal status 3) No evidence of preterm labor 4) Wet prep & UA negative for infection  PLAN Reassured of normalcy of pelvic pressure at this point in pregnancy, comfort measures & positioning to relieve discomfort reviewed. Discharge home with preterm labor & fetal movement precautions. Maintain next scheduled prenatal visit, Monday at [facility]. [name], CNM 08/27/2024  8:06 PM

## 2024-08-27 NOTE — OB Triage Note (Signed)
 Pt is being discharged stable and ambulatory. Pt educated on red flags signs to return such as LOF, VB, CTX, and decreased fetal movement. Discharge instructions provided and reviewed with the pt. Pt verbalizes understanding.  Pt discharged home per provider order.

## 2024-09-30 ENCOUNTER — Telehealth (HOSPITAL_COMMUNITY): Payer: Self-pay | Admitting: *Deleted

## 2024-09-30 ENCOUNTER — Encounter (HOSPITAL_COMMUNITY): Payer: Self-pay | Admitting: *Deleted

## 2024-09-30 ENCOUNTER — Inpatient Hospital Stay (HOSPITAL_COMMUNITY)
Admission: AD | Admit: 2024-09-30 | Discharge: 2024-10-01 | Disposition: A | Payer: Self-pay | Source: Home / Self Care | Attending: Obstetrics and Gynecology | Admitting: Obstetrics and Gynecology

## 2024-09-30 DIAGNOSIS — O471 False labor at or after 37 completed weeks of gestation: Secondary | ICD-10-CM

## 2024-09-30 NOTE — MAU Note (Signed)
..  Melissa [name] is a 29 y.o. at [redacted]w[redacted]d here in MAU reporting: lost her mucous plug this morning and it continues to come out. Is not having leaking of fluid or vaginal bleeding. Reports cramping. +FM  Pain score: 3/10 Vitals:   09/30/24 2341 09/30/24 2342  BP:  122/75  Pulse:  85  Resp:  18  Temp:  98.8 F (37.1 C)  SpO2: 100% 100%     FHT:[id number] Lab orders placed from triage:  none

## 2024-09-30 NOTE — Telephone Encounter (Signed)
 Preadmission screen

## 2024-10-01 ENCOUNTER — Encounter (HOSPITAL_COMMUNITY): Payer: Self-pay | Admitting: Obstetrics and Gynecology

## 2024-10-01 NOTE — MAU Provider Note (Signed)
 Melissa [name] is a 29 y.o. [phone] female at [redacted]w[redacted]d  RN Labor check, not seen by provider SVE by RN: Dilation: Closed Effacement (%): Thick Station: -2 Exam by:: [employer], RN NST: FHR baseline 145-150s bpm, Variability: moderate, Accelerations:present, Decelerations:  Absent= Cat 1/Reactive Toco: irreg  D/C home  [name] CNM 10/01/2024 12:29 AM

## 2024-10-01 NOTE — MAU Note (Signed)
 Pt reports occ ctx. 5/10 pain.

## 2024-10-05 ENCOUNTER — Inpatient Hospital Stay (HOSPITAL_COMMUNITY): Admission: RE | Admit: 2024-10-05 | Payer: Self-pay | Source: Home / Self Care | Admitting: Family Medicine

## 2024-10-05 ENCOUNTER — Inpatient Hospital Stay (HOSPITAL_COMMUNITY): Payer: Self-pay
